# Patient Record
Sex: Female | Born: 1935 | Race: White | Hispanic: No | Smoking: Former smoker
Health system: Southern US, Community
[De-identification: ages and names within clinical notes are randomized; demographics above are authoritative.]

## PROBLEM LIST (undated history)

## (undated) DIAGNOSIS — E785 Hyperlipidemia, unspecified: Secondary | ICD-10-CM

## (undated) DIAGNOSIS — M109 Gout, unspecified: Secondary | ICD-10-CM

## (undated) DIAGNOSIS — I442 Atrioventricular block, complete: Secondary | ICD-10-CM

## (undated) DIAGNOSIS — I10 Essential (primary) hypertension: Secondary | ICD-10-CM

## (undated) DIAGNOSIS — K469 Unspecified abdominal hernia without obstruction or gangrene: Secondary | ICD-10-CM

## (undated) DIAGNOSIS — K625 Hemorrhage of anus and rectum: Secondary | ICD-10-CM

## (undated) DIAGNOSIS — I714 Abdominal aortic aneurysm, without rupture, unspecified: Secondary | ICD-10-CM

## (undated) DIAGNOSIS — Z950 Presence of cardiac pacemaker: Secondary | ICD-10-CM

## (undated) DIAGNOSIS — J449 Chronic obstructive pulmonary disease, unspecified: Secondary | ICD-10-CM

## (undated) DIAGNOSIS — N200 Calculus of kidney: Secondary | ICD-10-CM

## (undated) HISTORY — DX: Essential (primary) hypertension: I10

## (undated) HISTORY — PX: TONSILLECTOMY: SUR1361

## (undated) HISTORY — DX: Abdominal aortic aneurysm, without rupture: I71.4

## (undated) HISTORY — PX: ABDOMINAL AORTIC ANEURYSM REPAIR: SUR1152

## (undated) HISTORY — DX: Hyperlipidemia, unspecified: E78.5

## (undated) HISTORY — PX: OTHER SURGICAL HISTORY: SHX169

## (undated) HISTORY — DX: Abdominal aortic aneurysm, without rupture, unspecified: I71.40

## (undated) HISTORY — DX: Unspecified abdominal hernia without obstruction or gangrene: K46.9

## (undated) HISTORY — DX: Calculus of kidney: N20.0

## (undated) HISTORY — DX: Atrioventricular block, complete: I44.2

## (undated) HISTORY — DX: Gout, unspecified: M10.9

## (undated) HISTORY — PX: HERNIA REPAIR: SHX51

## (undated) HISTORY — DX: Presence of cardiac pacemaker: Z95.0

## (undated) HISTORY — DX: Hemorrhage of anus and rectum: K62.5

---

## 2007-06-20 ENCOUNTER — Ambulatory Visit: Payer: Self-pay | Admitting: Cardiology

## 2007-06-26 ENCOUNTER — Ambulatory Visit: Payer: Self-pay

## 2007-06-30 ENCOUNTER — Ambulatory Visit: Payer: Self-pay

## 2007-07-01 ENCOUNTER — Encounter: Payer: Self-pay | Admitting: Internal Medicine

## 2007-07-01 LAB — CONVERTED CEMR LAB
BUN: 23 mg/dL (ref 6–23)
CO2: 25 meq/L (ref 19–32)
Calcium: 9.1 mg/dL (ref 8.4–10.5)
Chloride: 105 meq/L (ref 96–112)
Creatinine, Ser: 1.18 mg/dL (ref 0.40–1.20)
Glucose, Bld: 83 mg/dL (ref 70–99)
HCT: 40.2 % (ref 36.0–46.0)
Hemoglobin: 13 g/dL (ref 12.0–15.0)
INR: 1 (ref 0.0–1.5)
MCHC: 32.3 g/dL (ref 30.0–36.0)
MCV: 91.6 fL (ref 78.0–100.0)
Potassium: 3.8 meq/L (ref 3.5–5.3)
Prothrombin Time: 13.1 s (ref 11.6–15.2)
RBC: 4.39 M/uL (ref 3.87–5.11)
RDW: 13.9 % (ref 11.5–15.5)
Sodium: 141 meq/L (ref 135–145)
WBC: 6.1 10*3/uL (ref 4.0–10.5)
aPTT: 33 s (ref 24–37)

## 2007-07-02 ENCOUNTER — Ambulatory Visit: Payer: Self-pay | Admitting: Internal Medicine

## 2007-07-03 ENCOUNTER — Inpatient Hospital Stay (HOSPITAL_COMMUNITY): Admission: RE | Admit: 2007-07-03 | Discharge: 2007-07-04 | Payer: Self-pay | Admitting: Internal Medicine

## 2007-07-17 ENCOUNTER — Ambulatory Visit: Payer: Self-pay

## 2007-07-17 ENCOUNTER — Encounter: Payer: Self-pay | Admitting: Cardiology

## 2007-07-17 ENCOUNTER — Ambulatory Visit: Payer: Self-pay | Admitting: Cardiology

## 2007-07-23 ENCOUNTER — Ambulatory Visit: Payer: Self-pay | Admitting: Cardiology

## 2007-07-28 ENCOUNTER — Ambulatory Visit: Payer: Self-pay | Admitting: Vascular Surgery

## 2007-08-12 ENCOUNTER — Encounter: Admission: RE | Admit: 2007-08-12 | Discharge: 2007-08-12 | Payer: Self-pay | Admitting: Vascular Surgery

## 2007-08-12 ENCOUNTER — Ambulatory Visit: Payer: Self-pay | Admitting: Vascular Surgery

## 2007-08-14 ENCOUNTER — Encounter: Payer: Self-pay | Admitting: Vascular Surgery

## 2007-08-14 ENCOUNTER — Inpatient Hospital Stay (HOSPITAL_COMMUNITY): Admission: RE | Admit: 2007-08-14 | Discharge: 2007-08-20 | Payer: Self-pay | Admitting: Vascular Surgery

## 2007-08-14 ENCOUNTER — Ambulatory Visit: Payer: Self-pay | Admitting: Vascular Surgery

## 2007-08-28 ENCOUNTER — Ambulatory Visit: Payer: Self-pay | Admitting: Vascular Surgery

## 2007-09-09 ENCOUNTER — Ambulatory Visit: Payer: Self-pay | Admitting: Vascular Surgery

## 2007-09-23 ENCOUNTER — Ambulatory Visit: Payer: Self-pay | Admitting: Internal Medicine

## 2007-10-07 ENCOUNTER — Encounter: Payer: Self-pay | Admitting: Internal Medicine

## 2007-10-07 ENCOUNTER — Ambulatory Visit: Payer: Self-pay | Admitting: Internal Medicine

## 2007-10-07 LAB — HM COLONOSCOPY

## 2007-10-08 ENCOUNTER — Encounter: Payer: Self-pay | Admitting: Internal Medicine

## 2007-10-22 ENCOUNTER — Ambulatory Visit: Payer: Self-pay | Admitting: Cardiovascular Disease

## 2007-10-22 ENCOUNTER — Inpatient Hospital Stay (HOSPITAL_COMMUNITY): Admission: RE | Admit: 2007-10-22 | Discharge: 2007-10-27 | Payer: Self-pay | Admitting: Surgery

## 2007-10-22 ENCOUNTER — Encounter (INDEPENDENT_AMBULATORY_CARE_PROVIDER_SITE_OTHER): Payer: Self-pay | Admitting: Surgery

## 2007-10-24 ENCOUNTER — Encounter: Payer: Self-pay | Admitting: Cardiovascular Disease

## 2007-11-19 LAB — CBC WITH DIFFERENTIAL/PLATELET
BASO%: 0.3 % (ref 0.0–2.0)
Basophils Absolute: 0 10*3/uL (ref 0.0–0.1)
EOS%: 1.8 % (ref 0.0–7.0)
Eosinophils Absolute: 0.1 10*3/uL (ref 0.0–0.5)
HCT: 32.3 % — ABNORMAL LOW (ref 34.8–46.6)
HGB: 11 g/dL — ABNORMAL LOW (ref 11.6–15.9)
LYMPH%: 19.1 % (ref 14.0–48.0)
MCH: 29.7 pg (ref 26.0–34.0)
MCHC: 33.9 g/dL (ref 32.0–36.0)
MCV: 87.7 fL (ref 81.0–101.0)
MONO#: 0.6 10*3/uL (ref 0.1–0.9)
MONO%: 9.3 % (ref 0.0–13.0)
NEUT#: 4.2 10*3/uL (ref 1.5–6.5)
NEUT%: 69.5 % (ref 39.6–76.8)
Platelets: 195 10*3/uL (ref 145–400)
RBC: 3.69 10*6/uL — ABNORMAL LOW (ref 3.70–5.32)
RDW: 14.6 % — ABNORMAL HIGH (ref 11.3–14.5)
WBC: 6.1 10*3/uL (ref 3.9–10.0)
lymph#: 1.2 10*3/uL (ref 0.9–3.3)

## 2007-11-19 LAB — COMPREHENSIVE METABOLIC PANEL
ALT: 10 U/L (ref 0–35)
AST: 15 U/L (ref 0–37)
Albumin: 4 g/dL (ref 3.5–5.2)
Alkaline Phosphatase: 72 U/L (ref 39–117)
BUN: 22 mg/dL (ref 6–23)
CO2: 29 mEq/L (ref 19–32)
Calcium: 8.9 mg/dL (ref 8.4–10.5)
Chloride: 103 mEq/L (ref 96–112)
Creatinine, Ser: 1.15 mg/dL (ref 0.40–1.20)
Glucose, Bld: 107 mg/dL — ABNORMAL HIGH (ref 70–99)
Potassium: 3.7 mEq/L (ref 3.5–5.3)
Sodium: 143 mEq/L (ref 135–145)
Total Bilirubin: 0.4 mg/dL (ref 0.3–1.2)
Total Protein: 6.1 g/dL (ref 6.0–8.3)

## 2007-11-19 LAB — CEA: CEA: 2.1 ng/mL (ref 0.0–5.0)

## 2007-11-24 ENCOUNTER — Ambulatory Visit: Payer: Self-pay | Admitting: Internal Medicine

## 2007-11-24 ENCOUNTER — Ambulatory Visit: Payer: Self-pay | Admitting: Oncology

## 2007-11-26 ENCOUNTER — Ambulatory Visit: Payer: Self-pay | Admitting: Cardiology

## 2007-12-02 ENCOUNTER — Ambulatory Visit: Payer: Self-pay | Admitting: Cardiology

## 2007-12-16 ENCOUNTER — Ambulatory Visit: Payer: Self-pay | Admitting: Vascular Surgery

## 2008-03-09 ENCOUNTER — Ambulatory Visit: Payer: Self-pay | Admitting: Vascular Surgery

## 2008-03-14 ENCOUNTER — Observation Stay: Payer: Self-pay | Admitting: Internal Medicine

## 2008-03-14 ENCOUNTER — Ambulatory Visit: Payer: Self-pay | Admitting: Cardiology

## 2008-03-16 ENCOUNTER — Ambulatory Visit: Payer: Self-pay | Admitting: Cardiology

## 2008-04-01 ENCOUNTER — Ambulatory Visit: Payer: Self-pay | Admitting: Oncology

## 2008-04-05 ENCOUNTER — Ambulatory Visit (HOSPITAL_COMMUNITY): Admission: RE | Admit: 2008-04-05 | Discharge: 2008-04-05 | Payer: Self-pay | Admitting: Oncology

## 2008-04-05 LAB — CBC WITH DIFFERENTIAL/PLATELET
BASO%: 0.9 % (ref 0.0–2.0)
Basophils Absolute: 0 10*3/uL (ref 0.0–0.1)
EOS%: 1.8 % (ref 0.0–7.0)
Eosinophils Absolute: 0.1 10*3/uL (ref 0.0–0.5)
HCT: 37.6 % (ref 34.8–46.6)
HGB: 12.8 g/dL (ref 11.6–15.9)
LYMPH%: 28.2 % (ref 14.0–49.7)
MCH: 30.7 pg (ref 25.1–34.0)
MCHC: 34.2 g/dL (ref 31.5–36.0)
MCV: 89.9 fL (ref 79.5–101.0)
MONO#: 0.4 10*3/uL (ref 0.1–0.9)
MONO%: 7.2 % (ref 0.0–14.0)
NEUT#: 3.5 10*3/uL (ref 1.5–6.5)
NEUT%: 61.9 % (ref 38.4–76.8)
Platelets: 162 10*3/uL (ref 145–400)
RBC: 4.18 10*6/uL (ref 3.70–5.45)
RDW: 14.3 % (ref 11.2–14.5)
WBC: 5.7 10*3/uL (ref 3.9–10.3)
lymph#: 1.6 10*3/uL (ref 0.9–3.3)

## 2008-04-05 LAB — COMPREHENSIVE METABOLIC PANEL
ALT: 20 U/L (ref 0–35)
AST: 25 U/L (ref 0–37)
Albumin: 4.4 g/dL (ref 3.5–5.2)
Alkaline Phosphatase: 78 U/L (ref 39–117)
BUN: 22 mg/dL (ref 6–23)
CO2: 29 mEq/L (ref 19–32)
Calcium: 9.8 mg/dL (ref 8.4–10.5)
Chloride: 102 mEq/L (ref 96–112)
Creatinine, Ser: 1.25 mg/dL — ABNORMAL HIGH (ref 0.40–1.20)
Glucose, Bld: 100 mg/dL — ABNORMAL HIGH (ref 70–99)
Potassium: 4 mEq/L (ref 3.5–5.3)
Sodium: 139 mEq/L (ref 135–145)
Total Bilirubin: 1 mg/dL (ref 0.3–1.2)
Total Protein: 7.6 g/dL (ref 6.0–8.3)

## 2008-04-05 LAB — CEA: CEA: 2 ng/mL (ref 0.0–5.0)

## 2008-05-07 ENCOUNTER — Encounter (INDEPENDENT_AMBULATORY_CARE_PROVIDER_SITE_OTHER): Payer: Self-pay | Admitting: *Deleted

## 2008-06-17 DIAGNOSIS — Z950 Presence of cardiac pacemaker: Secondary | ICD-10-CM | POA: Insufficient documentation

## 2008-06-17 DIAGNOSIS — I714 Abdominal aortic aneurysm, without rupture: Secondary | ICD-10-CM | POA: Insufficient documentation

## 2008-06-17 DIAGNOSIS — I442 Atrioventricular block, complete: Secondary | ICD-10-CM | POA: Insufficient documentation

## 2008-06-28 ENCOUNTER — Encounter: Payer: Self-pay | Admitting: Internal Medicine

## 2008-06-28 ENCOUNTER — Ambulatory Visit: Payer: Self-pay | Admitting: Internal Medicine

## 2008-08-10 ENCOUNTER — Encounter: Payer: Self-pay | Admitting: Cardiology

## 2008-08-10 ENCOUNTER — Ambulatory Visit: Payer: Self-pay

## 2008-08-24 ENCOUNTER — Ambulatory Visit: Payer: Self-pay | Admitting: Cardiology

## 2008-08-24 DIAGNOSIS — I6529 Occlusion and stenosis of unspecified carotid artery: Secondary | ICD-10-CM | POA: Insufficient documentation

## 2008-09-21 ENCOUNTER — Ambulatory Visit: Payer: Self-pay | Admitting: Vascular Surgery

## 2008-09-30 ENCOUNTER — Encounter: Payer: Self-pay | Admitting: Cardiovascular Disease

## 2008-09-30 ENCOUNTER — Encounter: Payer: Self-pay | Admitting: Cardiology

## 2008-10-01 ENCOUNTER — Ambulatory Visit: Payer: Self-pay | Admitting: Oncology

## 2008-10-05 ENCOUNTER — Ambulatory Visit (HOSPITAL_COMMUNITY): Admission: RE | Admit: 2008-10-05 | Discharge: 2008-10-05 | Payer: Self-pay | Admitting: Oncology

## 2008-10-05 LAB — COMPREHENSIVE METABOLIC PANEL
ALT: 18 U/L (ref 0–35)
AST: 22 U/L (ref 0–37)
Albumin: 4.3 g/dL (ref 3.5–5.2)
Alkaline Phosphatase: 88 U/L (ref 39–117)
BUN: 26 mg/dL — ABNORMAL HIGH (ref 6–23)
CO2: 28 mEq/L (ref 19–32)
Calcium: 9.9 mg/dL (ref 8.4–10.5)
Chloride: 102 mEq/L (ref 96–112)
Creatinine, Ser: 1.46 mg/dL — ABNORMAL HIGH (ref 0.40–1.20)
Glucose, Bld: 103 mg/dL — ABNORMAL HIGH (ref 70–99)
Potassium: 4.2 mEq/L (ref 3.5–5.3)
Sodium: 138 mEq/L (ref 135–145)
Total Bilirubin: 0.8 mg/dL (ref 0.3–1.2)
Total Protein: 7.1 g/dL (ref 6.0–8.3)

## 2008-10-05 LAB — CBC WITH DIFFERENTIAL/PLATELET
BASO%: 1 % (ref 0.0–2.0)
Basophils Absolute: 0.1 10*3/uL (ref 0.0–0.1)
EOS%: 1.4 % (ref 0.0–7.0)
Eosinophils Absolute: 0.1 10*3/uL (ref 0.0–0.5)
HCT: 37.3 % (ref 34.8–46.6)
HGB: 13 g/dL (ref 11.6–15.9)
LYMPH%: 27.8 % (ref 14.0–49.7)
MCH: 31.7 pg (ref 25.1–34.0)
MCHC: 34.9 g/dL (ref 31.5–36.0)
MCV: 90.7 fL (ref 79.5–101.0)
MONO#: 0.4 10*3/uL (ref 0.1–0.9)
MONO%: 8 % (ref 0.0–14.0)
NEUT#: 3.2 10*3/uL (ref 1.5–6.5)
NEUT%: 61.8 % (ref 38.4–76.8)
Platelets: 151 10*3/uL (ref 145–400)
RBC: 4.11 10*6/uL (ref 3.70–5.45)
RDW: 13.7 % (ref 11.2–14.5)
WBC: 5.2 10*3/uL (ref 3.9–10.3)
lymph#: 1.5 10*3/uL (ref 0.9–3.3)

## 2008-10-05 LAB — CEA: CEA: 2.5 ng/mL (ref 0.0–5.0)

## 2008-10-07 ENCOUNTER — Encounter: Payer: Self-pay | Admitting: Cardiology

## 2008-10-25 ENCOUNTER — Telehealth: Payer: Self-pay | Admitting: Cardiovascular Disease

## 2008-10-25 ENCOUNTER — Encounter: Payer: Self-pay | Admitting: Cardiology

## 2008-10-28 ENCOUNTER — Encounter: Payer: Self-pay | Admitting: Cardiovascular Disease

## 2008-12-06 ENCOUNTER — Ambulatory Visit (HOSPITAL_COMMUNITY): Admission: RE | Admit: 2008-12-06 | Discharge: 2008-12-06 | Payer: Self-pay | Admitting: General Surgery

## 2008-12-09 ENCOUNTER — Telehealth: Payer: Self-pay | Admitting: Cardiology

## 2008-12-27 ENCOUNTER — Encounter: Payer: Self-pay | Admitting: Cardiology

## 2009-02-07 ENCOUNTER — Encounter: Payer: Self-pay | Admitting: Cardiology

## 2009-02-28 ENCOUNTER — Encounter: Payer: Self-pay | Admitting: Cardiology

## 2009-03-07 ENCOUNTER — Ambulatory Visit: Payer: Self-pay | Admitting: Internal Medicine

## 2009-03-07 DIAGNOSIS — I4891 Unspecified atrial fibrillation: Secondary | ICD-10-CM | POA: Insufficient documentation

## 2009-03-23 ENCOUNTER — Inpatient Hospital Stay (HOSPITAL_COMMUNITY): Admission: RE | Admit: 2009-03-23 | Discharge: 2009-03-28 | Payer: Self-pay | Admitting: General Surgery

## 2009-05-02 ENCOUNTER — Ambulatory Visit: Payer: Self-pay | Admitting: Internal Medicine

## 2009-05-05 ENCOUNTER — Telehealth: Payer: Self-pay | Admitting: Internal Medicine

## 2009-05-16 ENCOUNTER — Telehealth: Payer: Self-pay | Admitting: Internal Medicine

## 2009-05-20 ENCOUNTER — Ambulatory Visit: Payer: Self-pay | Admitting: Cardiology

## 2009-05-20 LAB — CONVERTED CEMR LAB: POC INR: 1

## 2009-05-25 ENCOUNTER — Ambulatory Visit: Payer: Self-pay | Admitting: Internal Medicine

## 2009-05-25 LAB — CONVERTED CEMR LAB: POC INR: 1.4

## 2009-05-30 ENCOUNTER — Ambulatory Visit: Payer: Self-pay | Admitting: Cardiovascular Disease

## 2009-05-30 LAB — CONVERTED CEMR LAB: POC INR: 2.6

## 2009-06-06 ENCOUNTER — Ambulatory Visit: Payer: Self-pay | Admitting: Cardiovascular Disease

## 2009-06-06 LAB — CONVERTED CEMR LAB: POC INR: 3

## 2009-06-15 ENCOUNTER — Ambulatory Visit: Payer: Self-pay | Admitting: Cardiovascular Disease

## 2009-06-15 LAB — CONVERTED CEMR LAB: POC INR: 3

## 2009-06-29 ENCOUNTER — Ambulatory Visit: Payer: Self-pay | Admitting: Cardiovascular Disease

## 2009-06-29 LAB — CONVERTED CEMR LAB: POC INR: 2.7

## 2009-07-18 ENCOUNTER — Ambulatory Visit: Payer: Self-pay | Admitting: Cardiovascular Disease

## 2009-07-18 LAB — CONVERTED CEMR LAB: POC INR: 2

## 2009-08-17 ENCOUNTER — Encounter: Payer: Self-pay | Admitting: Cardiology

## 2009-08-22 ENCOUNTER — Ambulatory Visit: Payer: Self-pay | Admitting: Cardiovascular Disease

## 2009-08-22 LAB — CONVERTED CEMR LAB: POC INR: 2.3

## 2009-08-24 ENCOUNTER — Encounter: Payer: Self-pay | Admitting: Cardiology

## 2009-08-25 ENCOUNTER — Ambulatory Visit: Payer: Self-pay | Admitting: Cardiovascular Disease

## 2009-08-25 ENCOUNTER — Ambulatory Visit: Payer: Self-pay

## 2009-08-25 ENCOUNTER — Encounter: Payer: Self-pay | Admitting: Cardiology

## 2009-08-25 DIAGNOSIS — E785 Hyperlipidemia, unspecified: Secondary | ICD-10-CM | POA: Insufficient documentation

## 2009-08-25 DIAGNOSIS — I10 Essential (primary) hypertension: Secondary | ICD-10-CM | POA: Insufficient documentation

## 2009-09-05 ENCOUNTER — Ambulatory Visit: Payer: Self-pay | Admitting: Internal Medicine

## 2009-09-07 ENCOUNTER — Ambulatory Visit: Payer: Self-pay | Admitting: Family Medicine

## 2009-09-07 DIAGNOSIS — F411 Generalized anxiety disorder: Secondary | ICD-10-CM | POA: Insufficient documentation

## 2009-09-13 ENCOUNTER — Ambulatory Visit: Payer: Self-pay | Admitting: Family Medicine

## 2009-09-14 ENCOUNTER — Encounter: Payer: Self-pay | Admitting: Family Medicine

## 2009-09-14 LAB — CONVERTED CEMR LAB
ALT: 11 units/L (ref 0–35)
AST: 17 units/L (ref 0–37)
Albumin: 4.1 g/dL (ref 3.5–5.2)
Alkaline Phosphatase: 77 units/L (ref 39–117)
BUN: 18 mg/dL (ref 6–23)
Bilirubin, Direct: 0.1 mg/dL (ref 0.0–0.3)
CO2: 28 meq/L (ref 19–32)
Calcium: 9.2 mg/dL (ref 8.4–10.5)
Chloride: 105 meq/L (ref 96–112)
Cholesterol: 172 mg/dL (ref 0–200)
Creatinine, Ser: 1.2 mg/dL (ref 0.4–1.2)
GFR calc non Af Amer: 45.26 mL/min (ref >60)
Glucose, Bld: 96 mg/dL (ref 70–99)
HDL: 53.5 mg/dL (ref >39.00)
LDL Cholesterol: 91 mg/dL (ref 0–99)
Potassium: 4.7 meq/L (ref 3.5–5.1)
Sodium: 143 meq/L (ref 135–145)
Total Bilirubin: 0.7 mg/dL (ref 0.3–1.2)
Total CHOL/HDL Ratio: 3
Total Protein: 6.5 g/dL (ref 6.0–8.3)
Triglycerides: 137 mg/dL (ref 0.0–149.0)
VLDL: 27.4 mg/dL (ref 0.0–40.0)

## 2009-09-21 ENCOUNTER — Ambulatory Visit: Payer: Self-pay | Admitting: Cardiology

## 2009-09-21 LAB — CONVERTED CEMR LAB: POC INR: 2.1

## 2009-10-04 ENCOUNTER — Ambulatory Visit: Payer: Self-pay | Admitting: Vascular Surgery

## 2009-10-19 ENCOUNTER — Ambulatory Visit: Payer: Self-pay | Admitting: Cardiology

## 2009-10-19 LAB — CONVERTED CEMR LAB: POC INR: 2.2

## 2009-11-16 ENCOUNTER — Ambulatory Visit: Payer: Self-pay | Admitting: Cardiovascular Disease

## 2009-11-16 LAB — CONVERTED CEMR LAB: POC INR: 2.2

## 2009-11-18 ENCOUNTER — Encounter (INDEPENDENT_AMBULATORY_CARE_PROVIDER_SITE_OTHER): Payer: Self-pay | Admitting: *Deleted

## 2009-11-29 ENCOUNTER — Encounter: Payer: Self-pay | Admitting: Family Medicine

## 2009-12-14 ENCOUNTER — Ambulatory Visit: Payer: Self-pay | Admitting: Cardiovascular Disease

## 2009-12-14 LAB — CONVERTED CEMR LAB: POC INR: 2.3

## 2010-01-11 ENCOUNTER — Ambulatory Visit: Payer: Self-pay | Admitting: Cardiovascular Disease

## 2010-01-11 LAB — CONVERTED CEMR LAB: POC INR: 2.4

## 2010-02-08 ENCOUNTER — Ambulatory Visit: Admission: RE | Admit: 2010-02-08 | Discharge: 2010-02-08 | Payer: Self-pay | Source: Home / Self Care

## 2010-02-08 LAB — CONVERTED CEMR LAB: POC INR: 1.6

## 2010-02-21 NOTE — Medication Information (Signed)
Summary: CCR/AMD  Anticoagulant Therapy  Managed by: Freddrick March, RN, BSN PCP: [name] Supervising MD: Rockey Situ Indication 1: Atrial Fibrillation Lab Used: [facility] [place] Site: [place] INR POC 2.0 INR RANGE 2.0-3.0  Dietary changes: no    Health status changes: no    Bleeding/hemorrhagic complications: no    Recent/future hospitalizations: no    Any changes in medication regimen? yes       Details: Pt wants to discontinue Paxil currently taking 10mg  every 3rd day.  Recent/future dental: no  Any missed doses?: yes     Details: Missed 1 dose approx 10 days ago.    Is patient compliant with meds? yes       Allergies: 1)  ! Codeine 2)  ! Morphine  Anticoagulation Management History:      The patient is taking warfarin and comes in today for a routine follow up visit.  Positive risk factors for bleeding include an age of 75 years or older.  The bleeding index is 'intermediate risk'.  Positive CHADS2 values include Age > 61 years old.  Her last INR was 1.0.  Anticoagulation responsible provider: Gollan.  INR POC: 2.0.  Cuvette Lot#: [id number].  Exp: 09/2010.    Anticoagulation Management Assessment/Plan:      The patient's current anticoagulation dose is Warfarin sodium 5 mg tabs: 1 tab by mouth every evening or as directed by Anticoagulation Clinic.  The target INR is 2.0-3.0.  The next INR is due 08/22/2009.  Results were reviewed/authorized by Freddrick March, RN, BSN.  She was notified by Freddrick March RN.         Prior Anticoagulation Instructions: INR 2.7  Continue on same dosage 1 tablet daily except 1.5 tablets on Saturdays.  Recheck in 3 weeks.    Current Anticoagulation Instructions: INR 2.0  Continue on same dosage 1 tablet daily except 1.5 tablets on Saturdays.  Recheck in 4 weeks.

## 2010-02-21 NOTE — Letter (Signed)
Summary: Generic Letter  [place] at [facility]  [address] [place],  [address]   Phone: [phone]  Fax: [phone]    09/14/2009  [facility] [address] [place],   [address]  Dear Ms. COCKBURN,   All of your lab results including cholesterol, kidney, liver function and electrolytes are within normal limits.  Keep up the good work.!        Sincerely,       Arnette Norris, MD

## 2010-02-21 NOTE — Assessment & Plan Note (Signed)
Summary: F2M/AMD   Referring Provider:  Mar Daring, MD Primary Provider:  [name]  CC:  ROV; device check.  History of Present Illness: . [name] is seen in followup for a pacemaker implanted for complete heart block now status post device generator replacement about a year ago. exercise tolerance is much improved since that time.  She recently underwent hernia repair consequential to her AAA repair.  Temporally associated with post op atrial fibrillation  Thrombo embolic risk factors include htn, gender, agex2, vascular disease, prior TIA  she complains of chest pain primarily in her neck radiating to her arms bilaterally occurring at night and awakening her from slee.It is relieved by repositioning herself in bed       .       Problems Prior to Update: 1)  Atrial Fibrillation  (ICD-427.31) 2)  Carotid Artery Stenosis, Without Infarction  (ICD-433.10) 3)  Av Block, Complete  (ICD-426.0) 4)  Pacemaker  (ICD-V45.Marland Kitchen01) 5)  Abdominal Aortic Aneurysm  (ICD-441.4)  Medications Prior to Update: 1)  Aspirin 81 Mg Tbec (Aspirin) .... Take One Tablet By Mouth Daily - On Hold 2)  Lisinopril-Hydrochlorothiazide 10-12.5 Mg Tabs (Lisinopril-Hydrochlorothiazide) .... Take 1 By Mouth Once Daily - On Hold 3)  Simvastatin 20 Mg Tabs (Simvastatin) .... Take One Tablet By Mouth Daily At Bedtime 4)  Paroxetine Hcl 10 Mg Tabs (Paroxetine Hcl) .... Take 1 By Mouth Once Daily 5)  Ventolin Hfa 108 (90 Base) Mcg/act Aers (Albuterol Sulfate) .... As Needed 6)  Spiriva Handihaler 18 Mcg Caps (Tiotropium Bromide Monohydrate) .... As Needed 7)  Flax Seed Oil 1000 Mg Caps (Flaxseed (Linseed)) .... Three Times A Day 8)  Multivitamins   Tabs (Multiple Vitamin) .... Once Daily -  On Hold  Current Medications (verified): 1)  Lisinopril-Hydrochlorothiazide 10-12.5 Mg Tabs (Lisinopril-Hydrochlorothiazide) .... Take 1 By Mouth Once Daily - On Hold 2)  Simvastatin 20 Mg Tabs (Simvastatin) .... Take One  Tablet By Mouth Daily At Bedtime 3)  Paroxetine Hcl 10 Mg Tabs (Paroxetine Hcl) .... Take 1 By Mouth Once Daily 4)  Ventolin Hfa 108 (90 Base) Mcg/act Aers (Albuterol Sulfate) .... As Needed 5)  Spiriva Handihaler 18 Mcg Caps (Tiotropium Bromide Monohydrate) .... As Needed  Allergies: 1)  ! Codeine 2)  ! Morphine  Vital Signs:  Patient profile:   77 year old female Height:      65 inches Weight:      143 pounds Pulse rate:   89 / minute BP sitting:   118 / 78  (right arm)  Vitals Entered By: Eliezer Lofts, EMT-P (May 02, 2009 9:42 AM)  Physical Exam  General:  The patient was alert and oriented in no acute distress. HEENT Normal.  Neck veins were flat, carotids were brisk.  Lungs were clear.  Heart sounds were regular without murmurs or gallops.  Abdomen was soft with active bowel sounds. There is no clubbing cyanosis or edema. Skin Warm and dry    EKG  Procedure date:  05/02/2009  Findings:      sinus rhythm with P. synchronous pacing  PPM Specifications Following MD:  Virl Axe, MD     PPM Vendor:  [facility] Jude     PPM Model Number:  [id number]     PPM Serial Number:  [id number] PPM DOI:  07/02/2007      Lead 1    Location: [facility]     DOI: 07/02/2007     Model #: [id number]     Serial #: [id number]  Status: active Lead 2    Location: RV     DOI: 07/02/2007     Model #: [id number]     Serial #: [id number]     Status: active   Indications:  CHB    PPM Follow Up Remote Check?  No Battery Voltage:  2.79 V     Battery Est. Longevity:  7.25 years     Pacer Dependent:  No       PPM Device Measurements Atrium  Amplitude: 3.0 mV, Impedance: 468 ohms, Threshold: 0.5 V at 0.5 msec Right Ventricle  Amplitude: 5.4 mV, Impedance: 624 ohms, Threshold: 0.625 V at 0.5 msec  Episodes MS Episodes:  1     Percent Mode Switch:  4.6%     Coumadin:  No Atrial Pacing:  8.6%     Ventricular Pacing:  100%  Parameters Mode:  DDDR     Lower Rate Limit:  60     Upper Rate Limit:  120 Paced AV Delay:  200      Sensed AV Delay:  150 Next Cardiology Appt Due:  10/22/2009 Tech Comments:  One mode switch lasting >2 days, -coumadin.  No parameter changes.  Device function normal.  ROV 6 months [place] clinic. Alma Friendly, LPN  April 11, 624THL 624THL AM   Impression & Recommendations:  Problem # 1:  ATRIAL FIBRILLATION (ICD-427.31) Patient has multiple risk factors for thromboembolism in the context of her atrial fibrillation as outlined above. We will begin her on oral anticoagulation therapy.We had a lengthy discussion regarding the relative merits of Coumadin versus Pradaxa. These included a relative benefits as well as risks. The patient would like to begin on Pradaxa. This discussion took greater than 10 minutes We will discontinue her aspirin  Problem # 2:  AV BLOCK, COMPLETE (ICD-426.0) stable following device implantation The following medications were removed from the medication list:    Aspirin 81 Mg Tbec (Aspirin) .Marland Kitchen... Take one tablet by mouth daily - on hold Her updated medication list for this problem includes:    Lisinopril-hydrochlorothiazide 10-12.5 Mg Tabs (Lisinopril-hydrochlorothiazide) .Marland Kitchen... Take 1 by mouth once daily - on hold  Problem # 3:  PACEMAKER (ICD-V45.Marland Kitchen01) Device parameters and data were reviewed; device was reprogrammed for atrial high rate detection  Problem # 4:  CHEST PAIN, ATYPICAL (ICD-786.59) she is awakening with atypical chest pain is relieved by movement.  is almost certainly musculoskeletal    Aspirin 81 Mg Tbec (Aspirin) .Marland Kitchen... Take one tablet by mouth daily - on hold Her updated medication list for this problem includes:    Lisinopril-hydrochlorothiazide 10-12.5 Mg Tabs (Lisinopril-hydrochlorothiazide) .Marland Kitchen... Take 1 by mouth once daily - on hold  Patient Instructions: 1)  Your physician recommends that you schedule a follow-up appointment in: 4 months 2)  Your physician has recommended you make the following change in your medication: stop aspirin,  start pradaxa 150 mg twice daily Prescriptions: PRADAXA 150 MG CAPS (DABIGATRAN ETEXILATE MESYLATE) 1 tab by mouth twice a day  #60 x 6   Entered by:   Gabriel Cirri, RN, BSN   Authorized by:   Nikki Dom, MD, [facility]   Signed by:   Gabriel Cirri, RN, BSN on 05/02/2009   Method used:   Electronically to        [facility] (retail)       [address]       [place], [place]  [address]       Ph: [phone]  Fax: [phone]   [id number]

## 2010-02-21 NOTE — Medication Information (Signed)
Summary: rov/ewj  Anticoagulant Therapy  Managed by: Freddrick March, RN, BSN PCP: [name] Supervising MD: Rockey Situ Indication 1: Atrial Fibrillation Lab Used: [facility] [place] Site: [place] INR POC 2.2 INR RANGE 2.0-3.0  Dietary changes: no    Health status changes: no    Bleeding/hemorrhagic complications: no    Recent/future hospitalizations: no    Any changes in medication regimen? no    Recent/future dental: no  Any missed doses?: no       Is patient compliant with meds? yes       Allergies: 1)  ! Codeine 2)  ! Morphine  Anticoagulation Management History:      The patient is taking warfarin and comes in today for a routine follow up visit.  Positive risk factors for bleeding include an age of 75 years or older.  The bleeding index is 'intermediate risk'.  Positive CHADS2 values include History of HTN and Age > 42 years old.  Her last INR was 1.0.  Anticoagulation responsible provider: gollan.  INR POC: 2.2.  Cuvette Lot#: [id number].  Exp: 11/2010.    Anticoagulation Management Assessment/Plan:      The patient's current anticoagulation dose is Warfarin sodium 5 mg tabs: 1 tab by mouth every evening or as directed by Anticoagulation Clinic.  The target INR is 2.0-3.0.  The next INR is due 12/14/2009.  Results were reviewed/authorized by Freddrick March, RN, BSN.  She was notified by Freddrick March RN.         Prior Anticoagulation Instructions: INR 2.2  Continue on same dosage 1 tablet daily except 1.5 tablets on Saturdays.  Recheck in 4 weeks.   Current Anticoagulation Instructions: INR 2.2  Continue on same dosage 1 tablet daily except 1.5 tablets on Saturdays.  Recheck in 4 weeks.

## 2010-02-21 NOTE — Medication Information (Signed)
Summary: CCR  Anticoagulant Therapy  Managed by: Freddrick March, RN, BSN PCP: [name] Supervising MD: Rockey Situ Indication 1: Atrial Fibrillation Lab Used: [facility] [place] Site: [place] INR POC 2.6 INR RANGE 2.0-3.0  Dietary changes: no     Bleeding/hemorrhagic complications: no     Any changes in medication regimen? no     Any missed doses?: no       Is patient compliant with meds? yes       Allergies: 1)  ! Codeine 2)  ! Morphine  Anticoagulation Management History:      The patient is taking warfarin and comes in today for a routine follow up visit.  Positive risk factors for bleeding include an age of 75 years or older.  The bleeding index is 'intermediate risk'.  Positive CHADS2 values include Age > 75 years old.  Her last INR was 1.0.  Anticoagulation responsible provider: Stone Spirito.  INR POC: 2.6.  Cuvette Lot#: [id number].  Exp: 05/2010.    Anticoagulation Management Assessment/Plan:      The patient's current anticoagulation dose is Warfarin sodium 5 mg tabs: 1 tab by mouth every evening or as directed by Anticoagulation Clinic.  The next INR is due 06/06/2009.  Results were reviewed/authorized by Freddrick March, RN, BSN.  She was notified by Freddrick March RN.         Prior Anticoagulation Instructions: INR 1.4  Start taking 5mg  daily except 7.5mg  on Wednesdays and Fridays.  Recheck on Monday.    Current Anticoagulation Instructions: INR 2.6  Continue on same dosage 5mg  daily except 7.5mg  on Wednesdays and Saturdays.  Recheck in 1 week.

## 2010-02-21 NOTE — Progress Notes (Signed)
Summary: pradaxa and insurance issues  Phone Note Outgoing Call   Summary of Call: insurance will not cover pradaxa.  left message.  Initial call taken by: Gabriel Cirri, RN, BSN,  May 05, 2009 11:05 AM  Follow-up for Phone Call        pt aware. does not want to start coumadin.  will think about her options.  Melissa Howdeshell RN BSN   pt attempted to take pradaxa.  became lethargic and could not continue on the medication.  pt is adamant against starting coumadin.  please advise. Follow-up by: Gabriel Cirri, RN, BSN,  May 10, 2009 4:39 PM     Appended Document: pradaxa and insurance issues no pradaxa oh well no coumadin too bad the  best she can do is asa tanks  Appended Document: pradaxa and insurance issues pt aware. Gabriel Cirri RN BSN

## 2010-02-21 NOTE — Medication Information (Signed)
Summary: CCR/AMD  Anticoagulant Therapy  Managed by: Freddrick March, RN, BSN PCP: [name] Supervising MD: Rockey Situ Indication 1: Atrial Fibrillation Lab Used: [facility] [place] Site: [place] INR POC 1.4 INR RANGE 2.0-3.0    Bleeding/hemorrhagic complications: no     Any changes in medication regimen? no     Any missed doses?: no       Is patient compliant with meds? yes      Comments: Pt started on 5mg  daily on Friday 4/29.  Allergies: 1)  ! Codeine 2)  ! Morphine  Anticoagulation Management History:      The patient is taking warfarin and comes in today for a routine follow up visit.  Positive risk factors for bleeding include an age of 42 years or older.  The bleeding index is 'intermediate risk'.  Positive CHADS2 values include Age > 36 years old.  Her last INR was 1.0.  Anticoagulation responsible provider: Alvenia Treese.  INR POC: 1.4.    Anticoagulation Management Assessment/Plan:      The patient's current anticoagulation dose is Warfarin sodium 5 mg tabs: 1 tab by mouth every evening or as directed by Anticoagulation Clinic.  The next INR is due 05/30/2009.  Results were reviewed/authorized by Freddrick March, RN, BSN.  She was notified by Freddrick March RN.         Prior Anticoagulation Instructions: coumadin 5 mg daily   Current Anticoagulation Instructions: INR 1.4  Start taking 5mg  daily except 7.5mg  on Wednesdays and Fridays.  Recheck on Monday.

## 2010-02-21 NOTE — Progress Notes (Signed)
Summary: starting on coumadin  Phone Note Call from Patient   Summary of Call: pt has decided to start on coumadin after speaking with son.  rx called in to [facility].  pt will come in Friday for CCR check. Initial call taken by: Gabriel Cirri, RN, BSN,  May 16, 2009 1:41 PM    New/Updated Medications: WARFARIN SODIUM 5 MG TABS (WARFARIN SODIUM) 1 tab by mouth every evening or as directed by Anticoagulation Clinic Prescriptions: WARFARIN SODIUM 5 MG TABS (WARFARIN SODIUM) 1 tab by mouth every evening or as directed by Anticoagulation Clinic  #30 x 6   Entered by:   Gabriel Cirri, RN, BSN   Authorized by:   Nikki Dom, MD, [facility]   Signed by:   Gabriel Cirri, RN, BSN on 05/16/2009   Method used:   Electronically to        [facility]* (retail)       [address]       [place], [place]  [address]       Ph: [phone]       Fax: [phone]   RxID:   [id number]

## 2010-02-21 NOTE — Miscellaneous (Signed)
Summary: dx correction  Clinical Lists Changes  Problems: Changed problem from PACEMAKER (ICD-V45.Marland Kitchen01) to PACEMAKER, PERMANENT (ICD-V45.01) changed the incorrect dx code to correct dx code Jessica [name]  November 18, 2009 12:56 PM

## 2010-02-21 NOTE — Letter (Signed)
Summary: Dr Georgia Dom Office Note  Dr Georgia Dom Office Note   Imported By: Sallee Provencal 04/01/2009 16:22:10  _____________________________________________________________________  External Attachment:    Type:   Image     Comment:   External Document

## 2010-02-21 NOTE — Medication Information (Signed)
Summary: CCR  Anticoagulant Therapy  Managed by: Freddrick March, RN, BSN PCP: [name] Supervising MD: Rockey Situ Indication 1: Atrial Fibrillation Lab Used: [facility] [place] Site: [place] INR POC 2.3 INR RANGE 2.0-3.0   Health status changes: no    Bleeding/hemorrhagic complications: no     Any changes in medication regimen? no     Any missed doses?: no       Is patient compliant with meds? yes       Allergies: 1)  ! Codeine 2)  ! Morphine  Anticoagulation Management History:      The patient is taking warfarin and comes in today for a routine follow up visit.  Positive risk factors for bleeding include an age of 75 years or older.  The bleeding index is 'intermediate risk'.  Positive CHADS2 values include Age > 72 years old.  Her last INR was 1.0.  Anticoagulation responsible provider: Gollan.  INR POC: 2.3.  Cuvette Lot#: [id number].  Exp: 06/2010.    Anticoagulation Management Assessment/Plan:      The patient's current anticoagulation dose is Warfarin sodium 5 mg tabs: 1 tab by mouth every evening or as directed by Anticoagulation Clinic.  The target INR is 2.0-3.0.  The next INR is due 09/21/2009.  Results were reviewed/authorized by Freddrick March, RN, BSN.  She was notified by Cordelia Pen, RN.         Prior Anticoagulation Instructions: INR 2.0  Continue on same dosage 1 tablet daily except 1.5 tablets on Saturdays.  Recheck in 4 weeks.    Current Anticoagulation Instructions: INR 2.3   Continue taking  1 tab daily except for 1.5 tabs on Saturday. Recheck in 4 weeks.

## 2010-02-21 NOTE — Medication Information (Signed)
Summary: Jessica [name]  Anticoagulant Therapy  Managed by: Tula Nakayama, RN, BSN PCP: [name] Supervising MD: Rockey Situ Indication 1: Atrial Fibrillation Lab Used: [facility] [place] Site: [place] INR POC 2.3 INR RANGE 2.0-3.0  Dietary changes: no    Health status changes: no    Bleeding/hemorrhagic complications: no    Recent/future hospitalizations: no    Any changes in medication regimen? yes       Details: Vitamin B12 daily new med  Recent/future dental: no  Any missed doses?: no       Is patient compliant with meds? yes       Allergies: 1)  ! Codeine 2)  ! Morphine  Anticoagulation Management History:      The patient is taking warfarin and comes in today for a routine follow up visit.  Positive risk factors for bleeding include an age of 22 years or older.  The bleeding index is 'intermediate risk'.  Positive CHADS2 values include History of HTN and Age > 71 years old.  Her last INR was 1.0.  Anticoagulation responsible provider: gollan.  INR POC: 2.3.  Cuvette Lot#: [id number].  Exp: 12/2010.    Anticoagulation Management Assessment/Plan:      The patient's current anticoagulation dose is Warfarin sodium 5 mg tabs: 1 tab by mouth every evening or as directed by Anticoagulation Clinic.  The target INR is 2.0-3.0.  The next INR is due 01/11/2010.  Anticoagulation instructions were given to patient.  Results were reviewed/authorized by Tula Nakayama, RN, BSN.  She was notified by Tula Nakayama, RN, BSN.         Prior Anticoagulation Instructions: INR 2.2  Continue on same dosage 1 tablet daily except 1.5 tablets on Saturdays.  Recheck in 4 weeks.    Current Anticoagulation Instructions: INR 2.3 Continue 5mg s everyday except 7.5mg s on Saturdays. Recheck in 4 weeks.

## 2010-02-21 NOTE — Miscellaneous (Signed)
Summary: Orders Update  Clinical Lists Changes  Orders: Added new Test order of Carotid Duplex (Carotid Duplex) - Signed 

## 2010-02-21 NOTE — Medication Information (Signed)
Summary: rov/ewj  Anticoagulant Therapy  Managed by: Freddrick March, RN, BSN PCP: [name] Supervising MD: Aundra Dubin MD, [place] Indication 1: Atrial Fibrillation Lab Used: [facility] [place] Site: [place] INR POC 2.2 INR RANGE 2.0-3.0  Dietary changes: no    Health status changes: no    Bleeding/hemorrhagic complications: no    Recent/future hospitalizations: no    Any changes in medication regimen? no    Recent/future dental: no  Any missed doses?: no       Is patient compliant with meds? yes       Allergies: 1)  ! Codeine 2)  ! Morphine  Anticoagulation Management History:      The patient is taking warfarin and comes in today for a routine follow up visit.  Positive risk factors for bleeding include an age of 27 years or older.  The bleeding index is 'intermediate risk'.  Positive CHADS2 values include History of HTN and Age > 33 years old.  Her last INR was 1.0.  Anticoagulation responsible Mashelle Busick: Aundra Dubin MD, [place].  INR POC: 2.2.  Cuvette Lot#: [id number].  Exp: 11/2010.    Anticoagulation Management Assessment/Plan:      The patient's current anticoagulation dose is Warfarin sodium 5 mg tabs: 1 tab by mouth every evening or as directed by Anticoagulation Clinic.  The target INR is 2.0-3.0.  The next INR is due 11/16/2009.  Results were reviewed/authorized by Freddrick March, RN, BSN.  She was notified by Freddrick March RN.         Prior Anticoagulation Instructions: INR 2.1  Continue on same dosage 1 tablet daily except 1.5 tablets on Saturdays.   Recheck in 4 weeks.    Current Anticoagulation Instructions: INR 2.2  Continue on same dosage 1 tablet daily except 1.5 tablets on Saturdays.  Recheck in 4 weeks.

## 2010-02-21 NOTE — Assessment & Plan Note (Signed)
Summary: [facility]   CC:  Device Check.  Allergies (verified): 1)  ! Codeine 2)  ! Morphine  Vital Signs:  Patient profile:   77 year old female Height:      65 inches Weight:      135 pounds BMI:     22.55 Pulse rate:   91 / minute BP sitting:   136 / 78  (left arm) Cuff size:   regular  Vitals Entered By: Eliezer Lofts, EMT-P (June 28, 2008 9:52 AM)   PPM Specifications Following MD:  Virl Axe, MD     PPM Vendor:  [employer]     PPM Model Number:  [id number]     PPM Serial Number:  [id number] PPM DOI:  07/02/2007       PPM Follow Up Remote Check?  No Battery Voltage:  2.79 V     Battery Est. Longevity:  >10 years       PPM Device Measurements Atrium  Amplitude: 2.6 mV, Impedance: 434 ohms, Threshold: .5 V at 0.5 msec Right Ventricle  Amplitude: 4.6 mV, Impedance: 582 ohms, Threshold: 0.625 V at 0.5 msec  Episodes MS Episodes:  17     Percent Mode Switch:  <1%     Coumadin:  No Ventricular High Rate:  0     Atrial Pacing:  11%     Ventricular Pacing:  >99%  Parameters Mode:  DDDR     Lower Rate Limit:  60     Upper Rate Limit:  120 Paced AV Delay:  200     Sensed AV Delay:  150 Next Cardiology Appt Due:  12/22/2008 Tech Comments:  Longest mode switch 4:29 hours No changes Alma Friendly, LPN  June  7, 624THL [id number] AM

## 2010-02-21 NOTE — Assessment & Plan Note (Signed)
Summary: NEW PT TO ESTABH/DLO   Vital Signs:  Patient profile:   77 year old female Height:      64.75 inches Weight:      148.25 pounds BMI:     24.95 Temp:     98.0 degrees F oral Pulse rate:   76 / minute Pulse rhythm:   regular BP sitting:   130 / 90  (left arm) Cuff size:   regular  Vitals Entered By: Sherrian Divers CMA Deborra Medina) (September 07, 2009 10:52 AM) CC: new patient, establish care   History of Present Illness: 77 yo with history of peripheral vascular disease, s/p AAA  repair in 2009,  h/o complete heart block s/p pacemaker implant, herna repair in 2009 with post op atrial fib, HTN, HLD here to establish care with no complants.   1.  HTN- has been stable on Lisinopril-HCTZ 10-12.5 mg daily.  Followed by Dr. Rockey Situ as well. No HA, blurred vision, LE edema.  2.  Hyperlipidemia- on Simvasatin 20 mg daily, no h/o elevated LFTs or myalgias.  Due to have lipids rechecked, not fasitng today.  3.  Anxiety- lost her son, husband, then had multiple medical problems including an aortic anuerysm and complicated hernia repair.  Was placed on Paxil 10 mg daily at that time and feels like it has taken the edge off.  Less anxiety.  Denies symptoms of depression.  No SI or HI.  4.  h/o afib- post op hernia repair,  rate controlled.  On coumadin.  Denies any obvious sources of bleeding.  Does bruise easily.   Current Medications (verified): 1)  Lisinopril-Hydrochlorothiazide 10-12.5 Mg Tabs (Lisinopril-Hydrochlorothiazide) .... Take 1 By Mouth Once Daily 2)  Simvastatin 20 Mg Tabs (Simvastatin) .... Take One Tablet By Mouth Daily At Bedtime 3)  Paroxetine Hcl 10 Mg Tabs (Paroxetine Hcl) .... Take 1 By Mouth Once Daily 4)  Spiriva Handihaler 18 Mcg Caps (Tiotropium Bromide Monohydrate) .... As Needed 5)  Warfarin Sodium 5 Mg Tabs (Warfarin Sodium) .Marland Kitchen.. 1 Tab By Mouth Every Evening or As Directed By Anticoagulation Clinic 6)  B-12 7)  Sb Anti-Gas 180 Mg Caps (Simethicone) .... Take One  Tablet By Mouth As Needed  Allergies: 1)  ! Codeine 2)  ! Morphine  Past History:  Past Medical History: Last updated: 03/07/2009 AV BLOCK, COMPLETE (ICD-426.0) PACEMAKER (ICD-V45.Marland Kitchen01) ABDOMINAL AORTIC ANEURYSM (ICD-441.4) Hiatel Hernia  Past Surgical History: Last updated: 03/07/2009 anerysm growth on colon surgery ppm changed out Tonsillectomy  Family History: Last updated: 06/17/2008 Family History of Cancer:  Heart  Social History: Last updated: 06/17/2008 Retired  Widowed  Tobacco Use - Former.  Alcohol Use - yes Regular Exercise - no Drug Use - no  Risk Factors: Exercise: no (06/17/2008)  Risk Factors: Smoking Status: quit (06/17/2008)  Review of Systems      See HPI General:  Denies malaise. Eyes:  Denies blurring. ENT:  Denies difficulty swallowing. CV:  Denies chest pain or discomfort, difficulty breathing at night, difficulty breathing while lying down, and shortness of breath with exertion. Resp:  Denies shortness of breath. GI:  Denies abdominal pain, bloody stools, and change in bowel habits. GU:  Denies abnormal vaginal bleeding. MS:  Denies joint pain, joint redness, and joint swelling. Derm:  Denies rash. Neuro:  Denies headaches. Psych:  Denies anxiety and depression. Endo:  Denies cold intolerance and heat intolerance. Heme:  Denies abnormal bruising and bleeding.  Physical Exam  General:  Well developed, well nourished, in no acute distress. Head:  normal HEENT Eyes:  PERRLA/EOM intact; conjunctiva and lids normal. Ears:  External ear exam shows no significant lesions or deformities.  Otoscopic examination reveals clear canals, tympanic membranes are intact bilaterally without bulging, retraction, inflammation or discharge. Hearing is grossly normal bilaterally. Nose:  no external deformity.   Mouth:  no gingival abnormalities.   Neck:  flat neck veins; supple Lungs:  clear to auscultation Heart:  regular rate and rhythm with a  2/6 systolic murmur with a preserved split S2 Abdomen:  soft nontender without hepatomegaly or midline pulsation Msk:  Back normal, normal gait. Muscle strength and tone normal. Extremities:  no clubbing cyanosis or edema Neurologic:  alert and oriented grossly normal motor and sensory function Skin:  warm and dry Psych:  Normal affect.   Impression & Recommendations:  Problem # 1:  HYPERTENSION, BENIGN (ICD-401.1) Assessment Unchanged Well controlled on current meds. Her updated medication list for this problem includes:    Lisinopril-hydrochlorothiazide 10-12.5 Mg Tabs (Lisinopril-hydrochlorothiazide) .Marland Kitchen... Take 1 by mouth once daily  Problem # 2:  HYPERLIPIDEMIA-MIXED (ICD-272.4) Assessment: Unchanged Order fasting lipid panel, hepatic panel. Continue Simvastatin 20 mg daily. Her updated medication list for this problem includes:    Simvastatin 20 Mg Tabs (Simvastatin) .Marland Kitchen... Take one tablet by mouth daily at bedtime  Problem # 3:  ATRIAL FIBRILLATION (ICD-427.31) Assessment: Unchanged rate and rythmn controlled.  Continue coumadin. Her updated medication list for this problem includes:    Warfarin Sodium 5 Mg Tabs (Warfarin sodium) .Marland Kitchen... 1 tab by mouth every evening or as directed by anticoagulation clinic  Problem # 4:  ANXIETY STATE, UNSPECIFIED (ICD-300.00) Assessment: Improved Continue Paxil 10 mg daily. Her updated medication list for this problem includes:    Paroxetine Hcl 10 Mg Tabs (Paroxetine hcl) .Marland Kitchen... Take 1 by mouth once daily  Complete Medication List: 1)  Lisinopril-hydrochlorothiazide 10-12.5 Mg Tabs (Lisinopril-hydrochlorothiazide) .... Take 1 by mouth once daily 2)  Simvastatin 20 Mg Tabs (Simvastatin) .... Take one tablet by mouth daily at bedtime 3)  Paroxetine Hcl 10 Mg Tabs (Paroxetine hcl) .... Take 1 by mouth once daily 4)  Spiriva Handihaler 18 Mcg Caps (Tiotropium bromide monohydrate) .... As needed 5)  Warfarin Sodium 5 Mg Tabs (Warfarin sodium)  .Marland Kitchen.. 1 tab by mouth every evening or as directed by anticoagulation clinic 6)  B-12  7)  Sb Anti-gas 180 Mg Caps (Simethicone) .... Take one tablet by mouth as needed  Patient Instructions: 1)  Great to meet you, Ms. Rosebush. 2)  Please call your insurance company to see if they cover the Shingles vaccine.  Consider getting a mammogram( we would set that up for you). 3)  Come in at your convenience for lab visit (fasting)- BMET (401.1), fasting lipid panel, liver function test (272.4). Prescriptions: SIMVASTATIN 20 MG TABS (SIMVASTATIN) Take one tablet by mouth daily at bedtime  #90 x 6   Entered and Authorized by:   Arnette Norris MD   Signed by:   Arnette Norris MD on 09/07/2009   Method used:   Electronically to        [facility] (retail)       [address]       [place], [place]  [address]       Ph: [phone]       Fax: [phone]   RxID:   [id number] LISINOPRIL-HYDROCHLOROTHIAZIDE 10-12.5 MG TABS (LISINOPRIL-HYDROCHLOROTHIAZIDE) Take 1 by mouth once daily  #90 x 6   Entered and Authorized by:   Arnette Norris MD  Signed by:   Arnette Norris MD on 09/07/2009   Method used:   Electronically to        [facility] (retail)       [address]       [place], [place]  [address]       Ph: [phone]       Fax: [phone]   RxID:   [id number]   Current Allergies (reviewed today): ! CODEINE ! MORPHINE  Pneumovax Result Date:  08/27/2006 Pneumovax Result:  historical PAP Next Due:  Not Indicated   Prevention & Chronic Care Immunizations   Influenza vaccine: Not documented    Tetanus booster: Not documented    Pneumococcal vaccine: historical  (08/27/2006)   Pneumococcal vaccine due: None    H. zoster vaccine: Not documented   H. zoster vaccine deferral: Contraindicated  (09/07/2009)  Colorectal Screening   Hemoccult: Not documented   Hemoccult action/deferral: Not indicated  (09/07/2009)    Colonoscopy: Location:  [facility].     (10/07/2007)   Colonoscopy due: 09/2012  Other Screening   Pap smear: Not documented   Pap smear due: Not Indicated    Mammogram: Not documented   Mammogram action/deferral: Deferred  (09/07/2009)    DXA bone density scan: Not documented   DXA bone density action/deferral: Deferred  (09/07/2009)   Smoking status: quit  (06/17/2008)  Lipids   Total Cholesterol: Not documented   LDL: Not documented   LDL Direct: Not documented   HDL: Not documented   Triglycerides: Not documented    SGOT (AST): Not documented   SGPT (ALT): Not documented   Alkaline phosphatase: Not documented   Total bilirubin: Not documented  Hypertension   Last Blood Pressure: 130 / 90  (09/07/2009)   Serum creatinine: 1.18  (07/01/2007)   Serum potassium 3.8  (07/01/2007)  Self-Management Support :    Hypertension self-management support: Not documented    Lipid self-management support: Not documented

## 2010-02-21 NOTE — Medication Information (Signed)
Summary: CCR/AMD  Anticoagulant Therapy  Managed by: Freddrick March, RN, BSN PCP: [name] Supervising MD: Rockey Situ Indication 1: Atrial Fibrillation Lab Used: [facility] Site: [place] INR POC 2.7 INR RANGE 2.0-3.0  Dietary changes: no     Bleeding/hemorrhagic complications: no     Any changes in medication regimen? no     Any missed doses?: no       Is patient compliant with meds? yes       Allergies: 1)  ! Codeine 2)  ! Morphine  Anticoagulation Management History:      The patient is taking warfarin and comes in today for a routine follow up visit.  Positive risk factors for bleeding include an age of 76 years or older.  The bleeding index is 'intermediate risk'.  Positive CHADS2 values include Age > 85 years old.  Her last INR was 1.0.  Anticoagulation responsible provider: [facility].  INR POC: 2.7.  Cuvette Lot#: [id number].  Exp: 08/2010.    Anticoagulation Management Assessment/Plan:      The patient's current anticoagulation dose is Warfarin sodium 5 mg tabs: 1 tab by mouth every evening or as directed by Anticoagulation Clinic.  The target INR is 2.0-3.0.  The next INR is due 07/20/2009.  Results were reviewed/authorized by Freddrick March, RN, BSN.  She was notified by Freddrick March RN.         Prior Anticoagulation Instructions: INR 3.0  Take 1/2 tablet today then resume same dosage 1 tablet daily except 1.5 tablets on Saturdays.  Recheck in 2 weeks.    Current Anticoagulation Instructions: INR 2.7  Continue on same dosage 1 tablet daily except 1.5 tablets on Saturdays.  Recheck in 3 weeks.

## 2010-02-21 NOTE — Letter (Signed)
Summary: [facility] Patient Note   [facility] Patient Note   Imported By: Sallee Provencal 09/27/2009 14:45:11  _____________________________________________________________________  External Attachment:    Type:   Image     Comment:   External Document

## 2010-02-21 NOTE — Medication Information (Signed)
Summary: ccn  Anticoagulant Therapy  Managed by: Gabriel Cirri, RN, BSN PCP: [name] Supervising MD: Aundra Dubin MD, Marnee Sherrard Indication 1: Atrial Fibrillation INR POC 1.0 INR RANGE 2.0-3.0          Comments: pt has not started coumadin yet  Allergies: 1)  ! Codeine 2)  ! Morphine  Anticoagulation Management History:      The patient comes in today for her initial visit for anticoagulation therapy.  Positive risk factors for bleeding include an age of 75 years or older.  The bleeding index is 'intermediate risk'.  Positive CHADS2 values include Age > 32 years old.  Her last INR was 1.0.  Anticoagulation responsible provider: Aundra Dubin MD, Ziyana Morikawa.  INR POC: 1.0.    Anticoagulation Management Assessment/Plan:      The patient's current anticoagulation dose is Warfarin sodium 5 mg tabs: 1 tab by mouth every evening or as directed by Anticoagulation Clinic.  The next INR is due 05/27/2009.  Results were reviewed/authorized by Gabriel Cirri, RN, BSN.  She was notified by Gabriel Cirri, RN, BSN.         Current Anticoagulation Instructions: coumadin 5 mg daily

## 2010-02-21 NOTE — Procedures (Signed)
Summary: F4M/AMD   Visit Type:  Follow-up Referring Provider:  Mar Daring, MD Primary Provider:  [name]  CC:  "Doing well"..  History of Present Illness: Jessica [name] is seen in followup for pacemaker implantation for complete heart block.  she also has paroxysmal atrial fibrillation for which she takes Coumadin  She also  has a history of peripheral vascular disease, status post abdominal aortic aneurysm repair in 2009  herna repair in 2009 with post op atrial fib, HTN, prior TIA per the notes, carotid u/s showing mild carotid disease       .       Current Medications (verified): 1)  Lisinopril-Hydrochlorothiazide 10-12.5 Mg Tabs (Lisinopril-Hydrochlorothiazide) .... Take 1 By Mouth Once Daily 2)  Simvastatin 20 Mg Tabs (Simvastatin) .... Take One Tablet By Mouth Daily At Bedtime 3)  Paroxetine Hcl 10 Mg Tabs (Paroxetine Hcl) .... Take 1 By Mouth Once Daily 4)  Spiriva Handihaler 18 Mcg Caps (Tiotropium Bromide Monohydrate) .... As Needed 5)  Warfarin Sodium 5 Mg Tabs (Warfarin Sodium) .Marland Kitchen.. 1 Tab By Mouth Every Evening or As Directed By Anticoagulation Clinic 6)  B-12  Allergies (verified): 1)  ! Codeine 2)  ! Morphine  Past History:  Past Medical History: Last updated: 03/07/2009 AV BLOCK, COMPLETE (ICD-426.0) PACEMAKER (ICD-V45.Marland Kitchen01) ABDOMINAL AORTIC ANEURYSM (ICD-441.4) Hiatel Hernia  Past Surgical History: Last updated: 03/07/2009 anerysm growth on colon surgery ppm changed out Tonsillectomy  Family History: Last updated: 06/17/2008 Family History of Cancer:  Heart  Social History: Last updated: 06/17/2008 Retired  Widowed  Tobacco Use - Former.  Alcohol Use - yes Regular Exercise - no Drug Use - no  Risk Factors: Exercise: no (06/17/2008)  Risk Factors: Smoking Status: quit (06/17/2008)  Vital Signs:  Patient profile:   77 year old female Height:      65 inches Weight:      147 pounds BMI:     24.55 Pulse rate:   80 / minute BP  sitting:   132 / 82  (left arm) Cuff size:   regular  Vitals Entered By: [name], CMA (September 05, 2009 3:03 PM)  Physical Exam  General:  Well developed, well nourished, in no acute distress. Head:  normal HEENT Neck:  flat neck veins; supple Lungs:  clear to auscultation Heart:  regular rate and rhythm with a 2/6 systolic murmur with a preserved split S2 Abdomen:  soft nontender without hepatomegaly or midline pulsation Pulses:  intact distal pulses Extremities:  no clubbing cyanosis or edema Neurologic:  alert and oriented grossly normal motor and sensory function Skin:  warm and dry   PPM Specifications Following MD:  Virl Axe, MD     PPM Vendor:  [employer] Jude     PPM Model Number:  [id number]     PPM Serial Number:  [id number] DOI:  07/02/2007      Lead 1    Location: [facility]     DOI: 07/02/2007     Model #: [id number]     Serial #: [id number]     Status: active Lead 2    Location: [facility]     DOI: 07/02/2007     Model #: [id number]     Serial #: [id number]     Status: active   Indications:  CHB    PPM Follow Up Remote Check?  No Battery Voltage:  2.80 V     Battery Est. Longevity:  3.75 years     Pacer Dependent:  No  PPM Device Measurements Atrium  Amplitude: 3.0 mV, Impedance: .71 ohms, Threshold: 0.5 V at 0.5 msec Right Ventricle  Amplitude: 4.3 mV, Impedance: 545 ohms, Threshold: 0.625 V at 0.5 msec  Episodes MS Episodes:  22     Percent Mode Switch:  1.1%     Coumadin:  Yes Atrial Pacing:  20%     Ventricular Pacing:  100%  Parameters Mode:  DDDR     Lower Rate Limit:  60     Upper Rate Limit:  120 Paced AV Delay:  200     Sensed AV Delay:  150 Next Cardiology Appt Due:  02/22/2010 Tech Comments:  No parameter changes.  Device function normal.  22 mode switch episodes with the longest being 10:52 hours, + coumadin, and ventricular rates controlled.  ROV 6 months [place] [facility]. Alma Friendly, LPN  August 15, 624THL 3:25 PM   Impression & Recommendations:  Problem # 1:  AV  BLOCK, COMPLETE (ICD-426.0) the patient is device dependent. She is on auto capture. Her updated medication list for this problem includes:    Lisinopril-hydrochlorothiazide 10-12.5 Mg Tabs (Lisinopril-hydrochlorothiazide) .Marland Kitchen... Take 1 by mouth once daily    Warfarin Sodium 5 Mg Tabs (Warfarin sodium) .Marland Kitchen... 1 tab by mouth every evening or as directed by anticoagulation clinic  Problem # 2:  PACEMAKER (ICD-V45.Marland Kitchen01) Device parameters and data were reviewed and no changes were made;  her longevity seems to be relatively short with predicted remaining life of 4-5 years and a two-year history. She is on auto capture to maximize longevity  Problem # 3:  ATRIAL FIBRILLATION (ICD-427.31) She continues to have paroxysms of atrial fibrillation. We discussed using Pradaxa; is not covered by her insurance. Her updated medication list for this problem includes:    Warfarin Sodium 5 Mg Tabs (Warfarin sodium) .Marland Kitchen... 1 tab by mouth every evening or as directed by anticoagulation clinic  Problem # 4:  HYPERTENSION, BENIGN (ICD-401.1) well-controlled today on her current medications Her updated medication list for this problem includes:    Lisinopril-hydrochlorothiazide 10-12.5 Mg Tabs (Lisinopril-hydrochlorothiazide) .Marland Kitchen... Take 1 by mouth once daily

## 2010-02-21 NOTE — Assessment & Plan Note (Signed)
Summary: EC6/AMD   Visit Type:  Initial Consult Referring Jessica [name]:  Jessica Daring, MD Primary Jessica [name]:  Jessica [name]  CC:  c/o shortness of breath..  History of Present Illness: Jessica [name] has a history of peripheral vascular disease, status post abdominal aortic aneurysm repair in 2009,  history of complete heart block status post pacemaker implant, herna repair in 2009 with post op atrial fib, HTN, prior TIA per the notes, carotid u/s showing mild carotid disease, previous pains in her  neck radiating to her arms bilaterally occurring at night and awakening her from sleep, relieved by repositioning herself in bed. She presents for routine follow up.  She states that she has been doing well. She is active. She has problems with the hot weather and finds  it harder to breath. She has not been using her inhalers and she does not think that she needs them. She does have a low grade chronic cough. No chest pain or leg pain with ambulation. No lightheadedness, diaphoresis with activity.   EKG shows NSR with LBBB, rate 76 bpm.        .       Current Medications (verified): 1)  Lisinopril-Hydrochlorothiazide 10-12.5 Mg Tabs (Lisinopril-Hydrochlorothiazide) .... Take 1 By Mouth Once Daily 2)  Simvastatin 20 Mg Tabs (Simvastatin) .... Take One Tablet By Mouth Daily At Bedtime 3)  Paroxetine Hcl 10 Mg Tabs (Paroxetine Hcl) .... Take 1 By Mouth Once Daily 4)  Spiriva Handihaler 18 Mcg Caps (Tiotropium Bromide Monohydrate) .... As Needed 5)  Warfarin Sodium 5 Mg Tabs (Warfarin Sodium) .Marland Kitchen.. 1 Tab By Mouth Every Evening or As Directed By Anticoagulation Clinic 6)  B-12  Allergies (verified): 1)  ! Codeine 2)  ! Morphine  Past History:  Past Medical History: Last updated: 03/07/2009 AV BLOCK, COMPLETE (ICD-426.0) PACEMAKER (ICD-V45.Marland Kitchen01) ABDOMINAL AORTIC ANEURYSM (ICD-441.4) Hiatel Hernia  Past Surgical History: Last updated: 03/07/2009 anerysm growth on colon surgery ppm changed  out Tonsillectomy  Family History: Last updated: 06/17/2008 Family History of Cancer:  Heart  Social History: Last updated: 06/17/2008 Retired  Widowed  Tobacco Use - Former.  Alcohol Use - yes Regular Exercise - no Drug Use - no  Risk Factors: Exercise: no (06/17/2008)  Risk Factors: Smoking Status: quit (06/17/2008)  Review of Systems       The patient complains of dyspnea on exertion.  The patient denies fever, weight loss, weight gain, vision loss, decreased hearing, hoarseness, chest pain, syncope, peripheral edema, prolonged cough, abdominal pain, incontinence, muscle weakness, depression, and enlarged lymph nodes.    Vital Signs:  Patient profile:   77 year old female Height:      65 inches Weight:      148 pounds BMI:     24.72 Pulse rate:   80 / minute BP sitting:   147 / 74  (left arm) Cuff size:   regular  Vitals Entered By: [name], CMA (August 25, 2009 9:34 AM)  Physical Exam  General:  Well developed, well nourished, in no acute distress. Head:  normocephalic and atraumatic Neck:  Neck supple, no JVD. No masses, thyromegaly or abnormal cervical nodes. Lungs:  Mildly decreased BS throughout, otherwise clear Heart:  Non-displaced PMI, chest non-tender;distant heart sounds,  regular rate and rhythm, S1, S2 with II/VI SEM at RSB, no rubs or gallops. Carotid upstroke normal, no bruit.. Pedals normal pulses. No edema, no varicosities. Abdomen:  Bowel sounds positive; abdomen soft and non-tender without masses Msk:  Back normal, normal gait. Muscle  strength and tone normal. Pulses:  pulses normal in all 4 extremities Extremities:  No clubbing or cyanosis. Neurologic:  Alert and oriented x 3. Skin:  Intact without lesions or rashes. Psych:  Normal affect.   PPM Specifications Following MD:  Virl Axe, MD     PPM Vendor:  [employer]     PPM Model Number:  [id number]     PPM Serial Number:  [id number] DOI:  07/02/2007      Lead 1    Location: [facility]     DOI:  07/02/2007     Model #: [id number]     Serial #: [id number]     Status: active Lead 2    Location: [facility]     DOI: 07/02/2007     Model #: [id number]     Serial #: [id number]     Status: active   Indications:  CHB    PPM Follow Up Pacer Dependent:  No      Episodes Coumadin:  No  Parameters Mode:  DDDR     Lower Rate Limit:  60     Upper Rate Limit:  120 Paced AV Delay:  200     Sensed AV Delay:  150  Impression & Recommendations:  Problem # 1:  ATRIAL FIBRILLATION (ICD-427.31) She remains in NSR today. No signs of tachypalpitations. She is not any rate controling medications.  Will monitor a-fib by acer interrogation, by Dr. Caryl Comes  Her updated medication list for this problem includes:    Warfarin Sodium 5 Mg Tabs (Warfarin sodium) .Marland Kitchen... 1 tab by mouth every evening or as directed by anticoagulation clinic  Orders: EKG w/ Interpretation (93000)  Problem # 2:  ABDOMINAL AORTIC ANEURYSM (ICD-441.4) Known PVD. AAA repair in 2009. Mild carotid disease on u/s. Will try to obtain liver/lipids from Dr. Tonye Pearson office.  Problem # 3:  HYPERLIPIDEMIA-MIXED (B2193296.4) Will try to obtain old lipids. LDL goal is <70. Suspect she will need new labs for this year.  Will adjust meds accordingly.  Her updated medication list for this problem includes:    Simvastatin 20 Mg Tabs (Simvastatin) .Marland Kitchen... Take one tablet by mouth daily at bedtime  Problem # 4:  HYPERTENSION, BENIGN (ICD-401.1) BP is borderline high today. I have asked her to monitor her BP as an outpt and call us for consistent SBPs >140.   Her updated medication list for this problem includes:    Lisinopril-hydrochlorothiazide 10-12.5 Mg Tabs (Lisinopril-hydrochlorothiazide) .Marland Kitchen... Take 1 by mouth once daily  Patient Instructions: 1)  Your physician recommends that you schedule a follow-up appointment in: 6 months 2)  Your physician recommends that you continue on your current medications as directed. Please refer to the Current  Medication list given to you today.  Appended Document: EC6/AMD She has pulmonary nodules seen on old CT scan from 11/2007. F/u CT of chest was recommended to monitor these nodules. Will defer to Dr. Marjory Lies (for visit on 8/27) as to whether this follow up might be needed. She has a long smoking hx, stopped 12 years ago.  Appended Document: EC6/AMD Lipids from 05/04/2009 shows chol 197, LDL 108, HDL 48 Lipids are too high and need to make medication change  Appended Document: EC6/AMD Need to recheck lipids as last were one year ago.To be check by Dr. Marjory Lies If numbers still high like in 2010, will need to make medication change to crestor 10 to 20 mg, or lipitor 20 to 40 mg

## 2010-02-21 NOTE — Medication Information (Signed)
Summary: CCR/AMD  Anticoagulant Therapy  Managed by: Freddrick March, RN, BSN PCP: [name] Supervising MD: Rockey Situ Indication 1: Atrial Fibrillation Lab Used: [facility] [place] Site: [place] INR POC 3.0 INR RANGE 2.0-3.0  Dietary changes: no    Health status changes: no    Bleeding/hemorrhagic complications: no    Recent/future hospitalizations: no    Any changes in medication regimen? no    Recent/future dental: no  Any missed doses?: no       Is patient compliant with meds? yes       Allergies: 1)  ! Codeine 2)  ! Morphine  Anticoagulation Management History:      The patient is taking warfarin and comes in today for a routine follow up visit.  Positive risk factors for bleeding include an age of 75 years or older.  The bleeding index is 'intermediate risk'.  Positive CHADS2 values include Age > 31 years old.  Her last INR was 1.0.  Anticoagulation responsible provider: Keela Rubert.  INR POC: 3.0.  Exp: 05/2010.    Anticoagulation Management Assessment/Plan:      The patient's current anticoagulation dose is Warfarin sodium 5 mg tabs: 1 tab by mouth every evening or as directed by Anticoagulation Clinic.  The target INR is 2.0-3.0.  The next INR is due 06/16/2009.  Results were reviewed/authorized by Freddrick March, RN, BSN.  She was notified by Freddrick March RN.         Prior Anticoagulation Instructions: INR 2.6  Continue on same dosage 5mg  daily except 7.5mg  on Wednesdays and Saturdays.  Recheck in 1 week.  Current Anticoagulation Instructions: INR 3.0  Start taking 5mg  daily except 7.5mg  on Saturdays.  Recheck 10 days.

## 2010-02-21 NOTE — Assessment & Plan Note (Signed)
Summary: f81m   Visit Type:  Follow-up Referring Provider:  Mar Daring, MD Primary Provider:  [name]  CC:  no cp, sob always, and no edema.  History of Present Illness: . Jessica [name] is seen in followup for a pacemaker implanted for complete heart block now status post device generator replacement about a year ago. exercise tolerance is much improved since that time.  She is scheduled to undergo abdominal hernia surgery in the beginning of March. Thromboembolic risk factors notable for:     a.     Prior stroke.     b.     Hypertension.     c.     Congestive heart failure.     d.     Age.     Current Problems (verified): 1)  Carotid Artery Stenosis, Without Infarction  (ICD-433.10) 2)  Av Block, Complete  (ICD-426.0) 3)  Pacemaker  (ICD-V45.Marland Kitchen01) 4)  Abdominal Aortic Aneurysm  (ICD-441.4)  Current Medications (verified): 1)  Aspirin 81 Mg Tbec (Aspirin) .... Take One Tablet By Mouth Daily - On Hold 2)  Lisinopril-Hydrochlorothiazide 10-12.5 Mg Tabs (Lisinopril-Hydrochlorothiazide) .... Take 1 By Mouth Once Daily - On Hold 3)  Simvastatin 20 Mg Tabs (Simvastatin) .... Take One Tablet By Mouth Daily At Bedtime 4)  Paroxetine Hcl 10 Mg Tabs (Paroxetine Hcl) .... Take 1 By Mouth Once Daily 5)  Ventolin Hfa 108 (90 Base) Mcg/act Aers (Albuterol Sulfate) .... As Needed 6)  Spiriva Handihaler 18 Mcg Caps (Tiotropium Bromide Monohydrate) .... As Needed 7)  Flax Seed Oil 1000 Mg Caps (Flaxseed (Linseed)) .... Three Times A Day 8)  Multivitamins   Tabs (Multiple Vitamin) .... Once Daily -  On Hold  Allergies (verified): 1)  ! Codeine 2)  ! Morphine  Past History:  Family History: Last updated: 06/17/2008 Family History of Cancer:  Heart  Social History: Last updated: 06/17/2008 Retired  Widowed  Tobacco Use - Former.  Alcohol Use - yes Regular Exercise - no Drug Use - no  Risk Factors: Exercise: no (06/17/2008)  Risk Factors: Smoking Status: quit  (06/17/2008)  Past Medical History: AV BLOCK, COMPLETE (ICD-426.0) PACEMAKER (ICD-V45.Marland Kitchen01) ABDOMINAL AORTIC ANEURYSM (ICD-441.4) Hiatel Hernia  Past Surgical History: anerysm growth on colon surgery ppm changed out Tonsillectomy  Vital Signs:  Patient profile:   77 year old female Height:      65 inches Weight:      149 pounds BMI:     24.88 Pulse rate:   84 / minute Pulse rhythm:   regular BP sitting:   130 / 76  (left arm) Cuff size:   regular  Vitals Entered By: Philemon Kingdom (March 07, 2009 11:27 AM)  Physical Exam  General:  The patient was alert and oriented in no acute distress. HEENT Normal.  Neck veins were flat, carotids were brisk.  Lungs were clear.  Heart sounds were regular without murmurs or gallops.  Abdomen was soft with active bowel sounds. There is no clubbing cyanosis or edema. Skin Warm and dry    PPM Specifications Following MD:  Virl Axe, MD     PPM Vendor:  St Jude     PPM Model Number:  [id number]     PPM Serial Number:  [id number] PPM DOI:  07/02/2007      Lead 1    Location: [facility]     DOI: 07/02/2007     Model #: [id number]     Serial #: [id number]     Status: active Lead 2  Location: RV     DOI: 07/02/2007     Model #: [id number]     Serial #: [id number]     Status: active   Indications:  CHB    PPM Follow Up Remote Check?  No Battery Voltage:  2.79 V     Battery Est. Longevity:  8.75 years       PPM Device Measurements Atrium  Amplitude: 2.8 mV, Impedance: 450 ohms, Threshold: 0.5 V at 0.5 msec Right Ventricle  Amplitude: 5.2 mV, Impedance: 553 ohms, Threshold: 0.75 V at 0.5 msec  Episodes MS Episodes:  28     Percent Mode Switch:  <1%     Coumadin:  No Atrial Pacing:  20%     Ventricular Pacing:  100%  Parameters Mode:  DDDR     Lower Rate Limit:  60     Upper Rate Limit:  120 Paced AV Delay:  200     Sensed AV Delay:  150 Next Cardiology Appt Due:  08/22/2009 Tech Comments:  No parameter changes.  28 mode switch episodes the  longest 6:04 minutes.  She is not on coumadin. ROV 6 months [place]. Alma Friendly, LPN  February 14, [id number] 11:34 AM   Impression & Recommendations:  Problem # 1:  ATRIAL FIBRILLATION (ICD-427.31) Atrial fibrillation was detected by her device. He has multiple thromboembolic risk factors as noted above. She was immediately started on Coumadin. We discussed potential benefits as well as risks of oral anticoagulation therapy. We'll plan to revisit this about a month after her surgery in early April. We discussed the potential use of Pradaxa in its place. She would probably prefer that Her updated medication list for this problem includes:    Aspirin 81 Mg Tbec (Aspirin) .Marland Kitchen... Take one tablet by mouth daily - on hold  Problem # 2:  AV BLOCK, COMPLETE (ICD-426.0) status post pacemaker implant he  Problem # 3:  PACEMAKER (ICD-V45.Marland Kitchen01) Device parameters and data were reviewed and no changes were made

## 2010-02-21 NOTE — Consult Note (Signed)
Summary: [facility]   Imported By: Marilynne Drivers 03/22/2009 09:55:32  _____________________________________________________________________  External Attachment:    Type:   Image     Comment:   External Document

## 2010-02-21 NOTE — Medication Information (Signed)
Summary: CCR/AMD  Anticoagulant Therapy  Managed by: Freddrick March, RN, BSN PCP: [name] Supervising MD: Rockey Situ Indication 1: Atrial Fibrillation Lab Used: [facility] [place] Site: [place] INR POC 3.0 INR RANGE 2.0-3.0    Bleeding/hemorrhagic complications: no     Any changes in medication regimen? no     Any missed doses?: yes     Details: Missed 1/2 tablet on Sat, but took on Sunday an extra 1/2 tablet.    Is patient compliant with meds? yes       Allergies: 1)  ! Codeine 2)  ! Morphine  Anticoagulation Management History:      The patient is taking warfarin and comes in today for a routine follow up visit.  Positive risk factors for bleeding include an age of 75 years or older.  The bleeding index is 'intermediate risk'.  Positive CHADS2 values include Age > 58 years old.  Her last INR was 1.0.  Anticoagulation responsible provider: Kamiyah Kindel.  INR POC: 3.0.  Cuvette Lot#: [id number].  Exp: 08/2010.    Anticoagulation Management Assessment/Plan:      The patient's current anticoagulation dose is Warfarin sodium 5 mg tabs: 1 tab by mouth every evening or as directed by Anticoagulation Clinic.  The target INR is 2.0-3.0.  The next INR is due 06/29/2009.  Results were reviewed/authorized by Freddrick March, RN, BSN.  She was notified by Freddrick March RN.         Prior Anticoagulation Instructions: INR 3.0  Start taking 5mg  daily except 7.5mg  on Saturdays.  Recheck 10 days.    Current Anticoagulation Instructions: INR 3.0  Take 1/2 tablet today then resume same dosage 1 tablet daily except 1.5 tablets on Saturdays.  Recheck in 2 weeks.

## 2010-02-21 NOTE — Medication Information (Signed)
Summary: CCR/NE  Anticoagulant Therapy  Managed by: Freddrick March, RN, BSN PCP: [name] Supervising MD: Aundra Dubin MD, [place] Indication 1: Atrial Fibrillation Lab Used: [facility] [place] Site: [place] INR POC 2.1 INR RANGE 2.0-3.0  Dietary changes: no    Health status changes: no    Bleeding/hemorrhagic complications: no    Recent/future hospitalizations: no    Any changes in medication regimen? no    Recent/future dental: no  Any missed doses?: no       Is patient compliant with meds? yes       Allergies: 1)  ! Codeine 2)  ! Morphine  Anticoagulation Management History:      The patient is taking warfarin and comes in today for a routine follow up visit.  Positive risk factors for bleeding include an age of 75 years or older.  The bleeding index is 'intermediate risk'.  Positive CHADS2 values include History of HTN and Age > 60 years old.  Her last INR was 1.0.  Anticoagulation responsible Bashar Milam: Aundra Dubin MD, [place].  INR POC: 2.1.  Cuvette Lot#: [id number].  Exp: 10/2010.    Anticoagulation Management Assessment/Plan:      The patient's current anticoagulation dose is Warfarin sodium 5 mg tabs: 1 tab by mouth every evening or as directed by Anticoagulation Clinic.  The target INR is 2.0-3.0.  The next INR is due 10/19/2009.  Results were reviewed/authorized by Freddrick March, RN, BSN.  She was notified by Freddrick March RN.         Prior Anticoagulation Instructions: INR 2.3   Continue taking  1 tab daily except for 1.5 tabs on Saturday. Recheck in 4 weeks.   Current Anticoagulation Instructions: INR 2.1  Continue on same dosage 1 tablet daily except 1.5 tablets on Saturdays.   Recheck in 4 weeks.

## 2010-02-21 NOTE — Miscellaneous (Signed)
Summary: Flu vaccine  Clinical Lists Changes  Observations: Added new observation of FLU VAX: Historical (11/28/2009 10:14)      Influenza Immunization History:    Influenza # 1:  Historical (11/28/2009) Received form from [facility], [place], [place]

## 2010-02-23 NOTE — Medication Information (Signed)
Summary: Jessica [name]  Anticoagulant Therapy  Managed by: Tula Nakayama, RN, BSN PCP: [name] Supervising MD: Rockey Situ Indication 1: Atrial Fibrillation Lab Used: [facility] [place] Site: [place] INR POC 1.6 INR RANGE 2.0-3.0  Dietary changes: no    Health status changes: yes       Details: Exacerbation of COPD last week  Bleeding/hemorrhagic complications: no    Recent/future hospitalizations: no    Any changes in medication regimen? no    Recent/future dental: no  Any missed doses?: yes     Details: Missed Sunday's dose   Is patient compliant with meds? yes       Allergies: 1)  ! Codeine 2)  ! Morphine  Anticoagulation Management History:      The patient is taking warfarin and comes in today for a routine follow up visit.  Positive risk factors for bleeding include an age of 75 years or older.  The bleeding index is 'intermediate risk'.  Positive CHADS2 values include History of HTN and Age > 46 years old.  Her last INR was 1.0.  Anticoagulation responsible provider: Saida Lonon.  INR POC: 1.6.  Cuvette Lot#: [id number].  Exp: 02/2011.    Anticoagulation Management Assessment/Plan:      The patient's current anticoagulation dose is Warfarin sodium 5 mg tabs: 1 tab by mouth every evening or as directed by Anticoagulation Clinic.  The target INR is 2.0-3.0.  The next INR is due 03/01/2010.  Anticoagulation instructions were given to patient.  Results were reviewed/authorized by Tula Nakayama, RN, BSN.  She was notified by Tula Nakayama, RN, BSN.         Prior Anticoagulation Instructions: INR 2.4  Continue on same dosage 5mg  daily except 7.5mg  on Saturdays.  Recheck in 4 weeks.    Current Anticoagulation Instructions: INR 1.6 Today take 1.5 pills then resume 1 pill everyday except 1.5 pills on Saturdays. Recheck in 3 weeks.

## 2010-02-23 NOTE — Medication Information (Signed)
Summary: rov/tm  Anticoagulant Therapy  Managed by: Freddrick March, RN, BSN PCP: [name] Supervising MD: Rockey Situ Indication 1: Atrial Fibrillation Lab Used: [facility] [place] Site: [place] INR POC 2.4 INR RANGE 2.0-3.0  Dietary changes: yes       Details: Ate turnip greens yesterday  Health status changes: no    Bleeding/hemorrhagic complications: no    Recent/future hospitalizations: no    Any changes in medication regimen? no    Recent/future dental: no  Any missed doses?: no       Is patient compliant with meds? yes       Allergies: 1)  ! Codeine 2)  ! Morphine  Anticoagulation Management History:      The patient is taking warfarin and comes in today for a routine follow up visit.  Positive risk factors for bleeding include an age of 75 years or older.  The bleeding index is 'intermediate risk'.  Positive CHADS2 values include History of HTN and Age > 24 years old.  Her last INR was 1.0.  Anticoagulation responsible provider: Jadea Shiffer.  INR POC: 2.4.  Cuvette Lot#: [id number].  Exp: 12/2010.    Anticoagulation Management Assessment/Plan:      The patient's current anticoagulation dose is Warfarin sodium 5 mg tabs: 1 tab by mouth every evening or as directed by Anticoagulation Clinic.  The target INR is 2.0-3.0.  The next INR is due 02/08/2010.  Anticoagulation instructions were given to patient.  Results were reviewed/authorized by Freddrick March, RN, BSN.  She was notified by Freddrick March RN.         Prior Anticoagulation Instructions: INR 2.3 Continue 5mg s everyday except 7.5mg s on Saturdays. Recheck in 4 weeks.   Current Anticoagulation Instructions: INR 2.4  Continue on same dosage 5mg  daily except 7.5mg  on Saturdays.  Recheck in 4 weeks.

## 2010-03-01 ENCOUNTER — Encounter (INDEPENDENT_AMBULATORY_CARE_PROVIDER_SITE_OTHER): Payer: MEDICARE

## 2010-03-01 ENCOUNTER — Encounter: Payer: Self-pay | Admitting: Cardiovascular Disease

## 2010-03-01 DIAGNOSIS — I4891 Unspecified atrial fibrillation: Secondary | ICD-10-CM

## 2010-03-01 DIAGNOSIS — Z7901 Long term (current) use of anticoagulants: Secondary | ICD-10-CM

## 2010-03-01 LAB — CONVERTED CEMR LAB: POC INR: 2.1

## 2010-03-02 ENCOUNTER — Encounter: Payer: Self-pay | Admitting: Internal Medicine

## 2010-03-02 ENCOUNTER — Encounter (INDEPENDENT_AMBULATORY_CARE_PROVIDER_SITE_OTHER): Payer: MEDICARE

## 2010-03-02 DIAGNOSIS — I442 Atrioventricular block, complete: Secondary | ICD-10-CM

## 2010-03-09 NOTE — Procedures (Signed)
Summary: PACER/AMD/NR   Current Medications (verified): 1)  Lisinopril-Hydrochlorothiazide 10-12.5 Mg Tabs (Lisinopril-Hydrochlorothiazide) .... Take 1 By Mouth Once Daily 2)  Simvastatin 20 Mg Tabs (Simvastatin) .... Take One Tablet By Mouth Daily At Bedtime 3)  Paroxetine Hcl 10 Mg Tabs (Paroxetine Hcl) .... Take 1 By Mouth Once Daily 4)  Spiriva Handihaler 18 Mcg Caps (Tiotropium Bromide Monohydrate) .... As Needed 5)  Warfarin Sodium 5 Mg Tabs (Warfarin Sodium) .Marland Kitchen.. 1 Tab By Mouth Every Evening or As Directed By Anticoagulation Clinic 6)  B-12 7)  Sb Anti-Gas 180 Mg Caps (Simethicone) .... Take One Tablet By Mouth As Needed  Allergies (verified): 1)  ! Codeine 2)  ! Morphine   PPM Specifications Following MD:  Virl Axe, MD     PPM Vendor:  St Jude     PPM Model Number:  [id number]     PPM Serial Number:  [id number] PPM DOI:  07/02/2007      Lead 1    Location: [facility]     DOI: 07/02/2007     Model #: [id number]     Serial #: [id number]     Status: active Lead 2    Location: RV     DOI: 07/02/2007     Model #: [id number]     Serial #: [id number]     Status: active   Indications:  CHB    PPM Follow Up Remote Check?  No Battery Voltage:  2.79 V     Battery Est. Longevity:  3.5 years     Pacer Dependent:  Yes       PPM Device Measurements Atrium  Amplitude: 2.9 mV, Impedance: 434 ohms, Threshold: 0.5 V at 0.5 msec Right Ventricle  Amplitude: 3.0 mV, Impedance: 558 ohms, Threshold: 0.625 V at 0.5 msec  Episodes MS Episodes:  55     Percent Mode Switch:  2.1%     Coumadin:  Yes Atrial Pacing:  29     Ventricular Pacing:  99  Parameters Mode:  DDDR     Lower Rate Limit:  60     Upper Rate Limit:  120 Paced AV Delay:  200     Sensed AV Delay:  150 Next Cardiology Appt Due:  08/23/2010 Tech Comments:  2.1% A-fib noted with controlled ventricular rates, + coumadin.  The longest episode was 13 hours.  R-waves chronic @ 3.0-5.63mV. No parameter changes.  ROV 6 months with Dr. Caryl Comes in [place]. Alma Friendly, LPN  February  9, [id number] 11:13 AM

## 2010-03-09 NOTE — Medication Information (Signed)
Summary: [facility]  Anticoagulant Therapy  Managed by: Freddrick March, RN, BSN PCP: [name] Supervising MD: Rockey Situ Indication 1: Atrial Fibrillation Lab Used: [facility] [place] Site: [place] INR POC 2.1 INR RANGE 2.0-3.0  Dietary changes: no    Health status changes: no    Bleeding/hemorrhagic complications: no    Recent/future hospitalizations: no    Any changes in medication regimen? yes       Details: Folic acid started  Recent/future dental: no  Any missed doses?: no       Is patient compliant with meds? yes       Allergies: 1)  ! Codeine 2)  ! Morphine  Anticoagulation Management History:      The patient is taking warfarin and comes in today for a routine follow up visit.  Positive risk factors for bleeding include an age of 75 years or older.  The bleeding index is 'intermediate risk'.  Positive CHADS2 values include History of HTN and Age > 62 years old.  Her last INR was 1.0.  Anticoagulation responsible provider: Dollie Mayse.  INR POC: 2.1.  Cuvette Lot#: [id number].  Exp: 02/2011.    Anticoagulation Management Assessment/Plan:      The patient's current anticoagulation dose is Warfarin sodium 5 mg tabs: 1 tab by mouth every evening or as directed by Anticoagulation Clinic.  The target INR is 2.0-3.0.  The next INR is due 03/29/2010.  Anticoagulation instructions were given to patient.  Results were reviewed/authorized by Freddrick March, RN, BSN.  She was notified by Freddrick March RN.         Prior Anticoagulation Instructions: INR 1.6 Today take 1.5 pills then resume 1 pill everyday except 1.5 pills on Saturdays. Recheck in 3 weeks.   Current Anticoagulation Instructions: INR 2.1  Continue on same dosage 1 tablet daily except 1.5 tablets on Saturdays.  Recheck in 4 weeks.

## 2010-03-29 ENCOUNTER — Encounter: Payer: Self-pay | Admitting: Internal Medicine

## 2010-03-29 ENCOUNTER — Encounter (INDEPENDENT_AMBULATORY_CARE_PROVIDER_SITE_OTHER): Payer: MEDICARE

## 2010-03-29 DIAGNOSIS — I4891 Unspecified atrial fibrillation: Secondary | ICD-10-CM

## 2010-03-29 DIAGNOSIS — Z7901 Long term (current) use of anticoagulants: Secondary | ICD-10-CM

## 2010-03-29 LAB — CONVERTED CEMR LAB: POC INR: 1.9

## 2010-03-30 NOTE — Cardiovascular Report (Signed)
Summary: Office Visit   Office Visit   Imported By: Sallee Provencal 03/20/2010 15:28:29  _____________________________________________________________________  External Attachment:    Type:   Image     Comment:   External Document

## 2010-04-04 NOTE — Medication Information (Signed)
Summary: rov/ewj  Anticoagulant Therapy  Managed by: Tula Nakayama, RN, BSN PCP: [name] Supervising MD: Haroldine Laws MD, Quillian Quince Indication 1: Atrial Fibrillation Lab Used: [facility] [place] Site: [place] INR POC 1.9 INR RANGE 2.0-3.0  Dietary changes: yes       Details: Did eat some extra leafy veggies  Health status changes: no    Bleeding/hemorrhagic complications: no    Recent/future hospitalizations: no    Any changes in medication regimen? yes       Details: Will start taking OTC vitamin for brain  Recent/future dental: no  Any missed doses?: no       Is patient compliant with meds? yes       Allergies: 1)  ! Codeine 2)  ! Morphine  Anticoagulation Management History:      The patient is taking warfarin and comes in today for a routine follow up visit.  Positive risk factors for bleeding include an age of 75 years or older.  The bleeding index is 'intermediate risk'.  Positive CHADS2 values include History of HTN and Age > 24 years old.  Her last INR was 1.0.  Anticoagulation responsible provider: Tyaire Odem MD, Quillian Quince.  INR POC: 1.9.  Cuvette Lot#: [id number].  Exp: 01/2011.    Anticoagulation Management Assessment/Plan:      The patient's current anticoagulation dose is Warfarin sodium 5 mg tabs: 1 tab by mouth every evening or as directed by Anticoagulation Clinic.  The target INR is 2.0-3.0.  The next INR is due 04/19/2010.  Anticoagulation instructions were given to patient.  Results were reviewed/authorized by Tula Nakayama, RN, BSN.  She was notified by Tula Nakayama, RN, BSN.         Prior Anticoagulation Instructions: INR 2.1  Continue on same dosage 1 tablet daily except 1.5 tablets on Saturdays.  Recheck in 4 weeks.    Current Anticoagulation Instructions: INR 1.9 Today take 1.5 pills then resume 1 pill everyday except 1.5 pills on Saturdays. Recheck in 3 weeks.

## 2010-04-12 ENCOUNTER — Encounter: Payer: Self-pay | Admitting: Cardiovascular Disease

## 2010-04-12 DIAGNOSIS — Z7901 Long term (current) use of anticoagulants: Secondary | ICD-10-CM | POA: Insufficient documentation

## 2010-04-12 DIAGNOSIS — I4891 Unspecified atrial fibrillation: Secondary | ICD-10-CM

## 2010-04-12 LAB — CBC
HCT: 38.9 % (ref 36.0–46.0)
Hemoglobin: 13.1 g/dL (ref 12.0–15.0)
MCHC: 33.6 g/dL (ref 30.0–36.0)
MCV: 92.7 fL (ref 78.0–100.0)
Platelets: 148 10*3/uL — ABNORMAL LOW (ref 150–400)
RBC: 4.19 MIL/uL (ref 3.87–5.11)
RDW: 14.1 % (ref 11.5–15.5)
WBC: 6.5 10*3/uL (ref 4.0–10.5)

## 2010-04-12 LAB — BASIC METABOLIC PANEL
BUN: 24 mg/dL — ABNORMAL HIGH (ref 6–23)
CO2: 29 mEq/L (ref 19–32)
Calcium: 9.5 mg/dL (ref 8.4–10.5)
Chloride: 101 mEq/L (ref 96–112)
Creatinine, Ser: 1.47 mg/dL — ABNORMAL HIGH (ref 0.4–1.2)
GFR calc Af Amer: 42 mL/min — ABNORMAL LOW (ref >60)
GFR calc non Af Amer: 34 mL/min — ABNORMAL LOW (ref >60)
Glucose, Bld: 108 mg/dL — ABNORMAL HIGH (ref 70–99)
Potassium: 4.6 mEq/L (ref 3.5–5.1)
Sodium: 138 mEq/L (ref 135–145)

## 2010-04-12 LAB — DIFFERENTIAL
Basophils Absolute: 0 10*3/uL (ref 0.0–0.1)
Basophils Relative: 1 % (ref 0–1)
Eosinophils Absolute: 0.1 10*3/uL (ref 0.0–0.7)
Eosinophils Relative: 2 % (ref 0–5)
Lymphocytes Relative: 26 % (ref 12–46)
Lymphs Abs: 1.7 10*3/uL (ref 0.7–4.0)
Monocytes Absolute: 0.5 10*3/uL (ref 0.1–1.0)
Monocytes Relative: 8 % (ref 3–12)
Neutro Abs: 4.1 10*3/uL (ref 1.7–7.7)
Neutrophils Relative %: 64 % (ref 43–77)

## 2010-04-14 ENCOUNTER — Other Ambulatory Visit: Payer: Self-pay | Admitting: Family Medicine

## 2010-04-14 ENCOUNTER — Ambulatory Visit (INDEPENDENT_AMBULATORY_CARE_PROVIDER_SITE_OTHER)
Admission: RE | Admit: 2010-04-14 | Discharge: 2010-04-14 | Disposition: A | Discharge status: Home / Self Care | Payer: MEDICARE | Source: Ambulatory Visit | Attending: Family Medicine | Admitting: Family Medicine

## 2010-04-14 ENCOUNTER — Encounter: Payer: Self-pay | Admitting: *Deleted

## 2010-04-14 ENCOUNTER — Encounter: Payer: Self-pay | Admitting: Family Medicine

## 2010-04-14 ENCOUNTER — Ambulatory Visit (INDEPENDENT_AMBULATORY_CARE_PROVIDER_SITE_OTHER): Payer: MEDICARE | Admitting: Family Medicine

## 2010-04-14 ENCOUNTER — Other Ambulatory Visit: Payer: Self-pay

## 2010-04-14 VITALS — BP 140/82 | HR 76 | Temp 98.7°F | Ht 67.0 in | Wt 150.4 lb

## 2010-04-14 DIAGNOSIS — M542 Cervicalgia: Secondary | ICD-10-CM

## 2010-04-14 MED ORDER — CYCLOBENZAPRINE HCL 5 MG PO TABS: 5.0000 mg | ORAL_TABLET | Freq: Two times a day (BID) | ORAL | Status: DC | PRN

## 2010-04-14 NOTE — Progress Notes (Signed)
  Subjective:    Patient ID: Jessica [name], female    DOB: 08/05/1929, 77 y.o.   MRN: [id number]  HPI Neck pain.  H/o carotid artery stenosis. Location: upper neck, bilateral Onset/Timing: last week, woke up with it Severity/Quality: very sore, 8/10 when she bends or turns her neck Worse/Better by: keeping head straight. Associated Symptoms: none.   Meds, vitals, and allergies reviewed.    Review of Systems ROS Weight loss: none Worse at rest: no Fever: none Change in bowel or bladder function: Loss of strength loss or sensation: none-no UE radiculopathy  Meds, vitals, and allergies reviewed.     Objective:   Physical Exam    NAD  NCAT Neck supple, TTP over C2/C3, tight trapezius muscles bilaterally. No bruits over carotids, upstroke normal. MMM RRR CTAB EXT w/o weakness, DTR wnl, sensation wnl spurling neg.     Assessment & Plan:

## 2010-04-14 NOTE — Assessment & Plan Note (Signed)
Seems most consistent with muscle strain- cervical and trapezius but given her level of pain and age, I am concerned about compression fx although no red flag signs presents. Rx given for flexeril- advised to use with caution. Cervical spine films.

## 2010-04-17 LAB — BASIC METABOLIC PANEL
BUN: 11 mg/dL (ref 6–23)
BUN: 17 mg/dL (ref 6–23)
BUN: 22 mg/dL (ref 6–23)
CO2: 26 mEq/L (ref 19–32)
CO2: 28 mEq/L (ref 19–32)
CO2: 30 mEq/L (ref 19–32)
Calcium: 8.3 mg/dL — ABNORMAL LOW (ref 8.4–10.5)
Calcium: 8.5 mg/dL (ref 8.4–10.5)
Calcium: 8.6 mg/dL (ref 8.4–10.5)
Chloride: 105 mEq/L (ref 96–112)
Chloride: 105 mEq/L (ref 96–112)
Chloride: 106 mEq/L (ref 96–112)
Creatinine, Ser: 1.22 mg/dL — ABNORMAL HIGH (ref 0.4–1.2)
Creatinine, Ser: 1.29 mg/dL — ABNORMAL HIGH (ref 0.4–1.2)
Creatinine, Ser: 1.31 mg/dL — ABNORMAL HIGH (ref 0.4–1.2)
GFR calc Af Amer: 48 mL/min — ABNORMAL LOW (ref >60)
GFR calc Af Amer: 48 mL/min — ABNORMAL LOW (ref >60)
GFR calc Af Amer: 52 mL/min — ABNORMAL LOW (ref >60)
GFR calc non Af Amer: 39 mL/min — ABNORMAL LOW (ref >60)
GFR calc non Af Amer: 40 mL/min — ABNORMAL LOW (ref >60)
GFR calc non Af Amer: 43 mL/min — ABNORMAL LOW (ref >60)
Glucose, Bld: 105 mg/dL — ABNORMAL HIGH (ref 70–99)
Glucose, Bld: 119 mg/dL — ABNORMAL HIGH (ref 70–99)
Glucose, Bld: 125 mg/dL — ABNORMAL HIGH (ref 70–99)
Potassium: 3.8 mEq/L (ref 3.5–5.1)
Potassium: 4.1 mEq/L (ref 3.5–5.1)
Potassium: 4.6 mEq/L (ref 3.5–5.1)
Sodium: 136 mEq/L (ref 135–145)
Sodium: 138 mEq/L (ref 135–145)
Sodium: 140 mEq/L (ref 135–145)

## 2010-04-17 LAB — CBC
HCT: 29.4 % — ABNORMAL LOW (ref 36.0–46.0)
HCT: 31.5 % — ABNORMAL LOW (ref 36.0–46.0)
Hemoglobin: 10.5 g/dL — ABNORMAL LOW (ref 12.0–15.0)
Hemoglobin: 9.5 g/dL — ABNORMAL LOW (ref 12.0–15.0)
MCHC: 32.3 g/dL (ref 30.0–36.0)
MCHC: 33.5 g/dL (ref 30.0–36.0)
MCV: 93.1 fL (ref 78.0–100.0)
MCV: 95.3 fL (ref 78.0–100.0)
Platelets: 105 10*3/uL — ABNORMAL LOW (ref 150–400)
Platelets: 131 10*3/uL — ABNORMAL LOW (ref 150–400)
RBC: 3.08 MIL/uL — ABNORMAL LOW (ref 3.87–5.11)
RBC: 3.38 MIL/uL — ABNORMAL LOW (ref 3.87–5.11)
RDW: 13.9 % (ref 11.5–15.5)
RDW: 14.1 % (ref 11.5–15.5)
WBC: 5.5 10*3/uL (ref 4.0–10.5)
WBC: 7.7 10*3/uL (ref 4.0–10.5)

## 2010-04-17 LAB — MRSA PCR SCREENING: MRSA by PCR: NEGATIVE

## 2010-04-19 ENCOUNTER — Ambulatory Visit (INDEPENDENT_AMBULATORY_CARE_PROVIDER_SITE_OTHER): Payer: MEDICARE | Admitting: Emergency Medicine

## 2010-04-19 DIAGNOSIS — I4891 Unspecified atrial fibrillation: Secondary | ICD-10-CM

## 2010-04-19 DIAGNOSIS — Z7901 Long term (current) use of anticoagulants: Secondary | ICD-10-CM

## 2010-04-19 LAB — POCT INR: INR: 1.8

## 2010-04-19 NOTE — Patient Instructions (Signed)
Take 1 tablet tonight, then start taking 1 tablet daily except 1.5 tablets on Wednesdays and Saturdays.  Recheck in 3 weeks

## 2010-04-26 LAB — CBC
HCT: 38.6 % (ref 36.0–46.0)
Hemoglobin: 13.3 g/dL (ref 12.0–15.0)
MCHC: 34.4 g/dL (ref 30.0–36.0)
MCV: 93.8 fL (ref 78.0–100.0)
Platelets: 147 10*3/uL — ABNORMAL LOW (ref 150–400)
RBC: 4.11 MIL/uL (ref 3.87–5.11)
RDW: 14.1 % (ref 11.5–15.5)
WBC: 6.9 10*3/uL (ref 4.0–10.5)

## 2010-04-26 LAB — DIFFERENTIAL
Basophils Absolute: 0.1 10*3/uL (ref 0.0–0.1)
Basophils Relative: 1 % (ref 0–1)
Eosinophils Absolute: 0.1 10*3/uL (ref 0.0–0.7)
Eosinophils Relative: 2 % (ref 0–5)
Lymphocytes Relative: 27 % (ref 12–46)
Lymphs Abs: 1.9 10*3/uL (ref 0.7–4.0)
Monocytes Absolute: 0.5 10*3/uL (ref 0.1–1.0)
Monocytes Relative: 7 % (ref 3–12)
Neutro Abs: 4.3 10*3/uL (ref 1.7–7.7)
Neutrophils Relative %: 63 % (ref 43–77)

## 2010-04-26 LAB — BASIC METABOLIC PANEL
BUN: 26 mg/dL — ABNORMAL HIGH (ref 6–23)
CO2: 29 mEq/L (ref 19–32)
Calcium: 9.5 mg/dL (ref 8.4–10.5)
Chloride: 100 mEq/L (ref 96–112)
Creatinine, Ser: 1.71 mg/dL — ABNORMAL HIGH (ref 0.4–1.2)
GFR calc Af Amer: 35 mL/min — ABNORMAL LOW (ref >60)
GFR calc non Af Amer: 29 mL/min — ABNORMAL LOW (ref >60)
Glucose, Bld: 93 mg/dL (ref 70–99)
Potassium: 4.1 mEq/L (ref 3.5–5.1)
Sodium: 137 mEq/L (ref 135–145)

## 2010-05-10 ENCOUNTER — Ambulatory Visit (INDEPENDENT_AMBULATORY_CARE_PROVIDER_SITE_OTHER): Payer: MEDICARE | Admitting: Emergency Medicine

## 2010-05-10 DIAGNOSIS — Z7901 Long term (current) use of anticoagulants: Secondary | ICD-10-CM

## 2010-05-10 DIAGNOSIS — I4891 Unspecified atrial fibrillation: Secondary | ICD-10-CM

## 2010-05-10 LAB — POCT INR: INR: 2.4

## 2010-06-02 ENCOUNTER — Other Ambulatory Visit: Payer: Self-pay

## 2010-06-02 ENCOUNTER — Other Ambulatory Visit: Payer: Self-pay | Admitting: *Deleted

## 2010-06-02 MED ORDER — WARFARIN SODIUM 5 MG PO TABS: 5.0000 mg | ORAL_TABLET | ORAL | Status: DC

## 2010-06-02 MED ORDER — ALBUTEROL SULFATE HFA 108 (90 BASE) MCG/ACT IN AERS: INHALATION_SPRAY | RESPIRATORY_TRACT | Status: DC

## 2010-06-06 ENCOUNTER — Telehealth: Payer: Self-pay | Admitting: *Deleted

## 2010-06-06 NOTE — Assessment & Plan Note (Signed)
[facility] OFFICE NOTE   Jessica, [name]                       MRN:          [id number]  DATE:12/02/2007                            DOB:          April 19, 1929    Jessica [name] returns today for followup of abnormal chest x-ray and CT  scan.  Dr. Caryl Comes saw her in the [facility] and re-ordered the CT.   CT done on November 26, 2007, shows stable lung nodules.  At this point  in time, I do not think we need further followup.   She has been through a whirlwind of activity with an abdominal aortic  aneurysm repair with an aorto-bi-iliac bypass graft by Dr. Kellie Simmering.  Followup has been completed and she has improved blood flow to her legs.  Other than trying to get her weight back up, she is doing remarkably  well.   During the evaluation of the aneurysm which we found surreptitiously on  a CT of the chest, she had a mucinous tumor of the appendix.  This was  removed by Dr. Neldon Mc the end of September.   She is doing well otherwise.   CURRENT MEDICATIONS:  1. Aspirin 81 mg a day.  2. Lisinopril Hydrochlorothiazide 10/12.5 daily.  3. Simvastatin 20 mg a day.  4. Paroxetine 10 mg a day.  5. Multivitamin daily.   PHYSICAL EXAMINATION:  Her blood pressure today is 112/66, her heart  rate is 84 and regular, weight is 122, down from 147 this summer.  She  looks remarkably good for what she has been through.  Skin is warm and  dry.  Lungs are clear.  Heart reveals a regular rate and rhythm.  Abdominal exam is soft.  The extremities reveal no edema.  She has good  pulses, 2+ dorsalis pedis pulses bilaterally and 1+ posterior tibialis.   I am delighted Jessica [name] is doing well.  We have cleared her on her CT.  I will see her back in 6 months.     Thomas C. Verl Blalock, MD, [facility]  Electronically Signed    TCW/MedQ  DD: 12/02/2007  DT: 12/03/2007  Job #: [id number]   cc:   Jessica School. Kimber Relic, MD

## 2010-06-06 NOTE — Consult Note (Signed)
Jessica [name], Jessica [name]                ACCOUNT NO.:  [id number]   MEDICAL RECORD NO.:  [id number]          PATIENT TYPE:  INP   LOCATION:  2312                         FACILITY:  [facility]   PHYSICIAN:  Haywood Lasso, M.D.DATE OF BIRTH:  [birth year]   DATE OF CONSULTATION:  DATE OF DISCHARGE:                                 CONSULTATION   REASON FOR CONSULTATION:  Jessica [name].   HISTORY OF PRESENT ILLNESS:  Jessica [name] is a 77 year old lady getting  ready to have an AAA repair by Dr. Mamie Nick.  On her preoperative  evaluation, she was found to have a large apparently cystic [name], which  seemed to be rising perhaps from the appendix.  He asked me to be  available and evaluate this intraoperatively.   Prior to surgery, I reviewed her CT scan and indeed there is about a 7-  cm [name] that to me appeared to be involving or adjacent to the cecum and  appendix.  It was not possible to tell what it was rising from.  There  was no evidence of any bowel obstruction.   Prior to surgery, I discussed the situation with the patient as well as  her son.  Our plan was for Dr. Kellie Simmering to open the abdomen and for me to  take a look at this.  If this looked like it could be taken out without  any significant contamination, then we would resect it.  If it appeared  that it needed to be a right colon, then Dr. Kellie Simmering and I would discuss  whether it would be more appropriate to do a right colectomy and come  back later for the AAA repair or simply go ahead with a AAA repair and  come back later for the Jessica [name].   Dr. Kellie Simmering opened the abdomen and I explored it with him.  There was  about a 7-cm [name] that seemed to be attached or rising from the lateral  aspect of the cecum.  It was well-circumscribed and did not appear to be  invading anything.  It was right at the base of the appendix as well.  It seemed somewhat cystic, but not necrotic.  It was mobile and looked  like there were some lateral  attachments and it would come up easily.  However, resecting this would require a right colectomy.   After discussion with Dr. Kellie Simmering, we felt that the aneurysm repair was  the most , successfully; so at this point, I scrubbed out and Dr. Kellie Simmering  proceeded with the aneurysm repair.  We will plan to follow her up in  the office in 3-4 weeks and then schedule her for an elective right  colectomy at some point in time.      Haywood Lasso, M.D.  Electronically Signed     CJS/MEDQ  D:  08/14/2007  T:  08/14/2007  Job:  [id number]   cc:   Nelda Severe. Kellie Simmering, M.D.

## 2010-06-06 NOTE — Discharge Summary (Signed)
Jessica [name], Jessica [name]                ACCOUNT NO.:  [id number]   MEDICAL RECORD NO.:  [id number]          PATIENT TYPE:  INP   LOCATION:  2918                         FACILITY:  [facility]   PHYSICIAN:  Deboraha Sprang, MD, FACCDATE OF BIRTH:  Aug 30, 1929   DATE OF ADMISSION:  07/02/2007  DATE OF DISCHARGE:  07/04/2007                               DISCHARGE SUMMARY   ALLERGIES:  This patient has an allergy to CODEINE.   FINAL DIAGNOSES:  1. Discharging day #2 status post pacemaker generator change with      implant of a St. Jude Zephyr XL DR dual-chamber pacemaker.  The      patient has new right atrial and right ventricular leads.  2. Complete heart block.  The patient is pacer dependent.   SECONDARY DIAGNOSES:  1. Progressive dyspnea over the last few weeks.  2. History of transient ischemic attack.  3. Hypertension.  4. Dyslipidemia.  5. Chronic obstructive pulmonary disease.   PROCEDURES:  July 02, 2007, implant of the [place] DR dual-  chamber pacemaker.  The patient had a CPI microlith 0505 pacemaker with  a unipolar passive lead implanted in 1980.   BRIEF HISTORY:  Jessica [name] is a 77 year old female who recently moved  from [place] to the [place] area.  She had a pacemaker implanted in  1980.  She has a diagnosis of complete heart block and is pacer  dependent.  The device has not been checked in more than 10 years and it  has reached end-of-life.   The patient has had progressive shortness of breath over the recent few  weeks in the setting of chronic dyspnea.  She does not have nocturnal  dyspnea or orthopnea.   The patient has had a couple episodes of chest pain that last about an  hour.  Other risk factors are notable for hypertension and dyslipidemia.  The patient was seen by Dr. Caryl Comes on June 8 when she presented to the  office of [facility] in [place].  She was in the midst of a  stress study to assess for the patient's intermittent chest pain.   This  was interrupted, so that she could come to [facility] and have  pacemaker implantation.   HOSPITAL COURSE:  The patient presents on June 10.  She underwent  implantation of a St. Jude Zephyr device after explantation of her CPI  microlith-P 505 single chamber pacemaker.  New right atrial and right  ventricular leads were placed.  Her device operation is DDDR.  The  pacemaker has been interrogated after implantation.  The values are  within normal limits.  Her incision is healing nicely.  There is no  hematoma.  Chest x-ray shows that the leads are in appropriate position  and no pneumothorax.  The patient was kept for 48 hours since she is  pacemaker dependent and intensive care unit will discharge from there on  June 12.   DISCHARGE MEDICATIONS:  She goes home on the following medications.  1. Multivitamin daily.  2. Enteric-coated aspirin 81 mg daily.  3.  Lisinopril/HCT 10/12.5 mg daily.  4. Simvastatin 20 mg daily at bedtime.  5. Singulair 10 mg daily as needed.   FOLLOWUP:  She follows up at [facility], [address] for the pacer clinic to have a repeat stress test and an  echocardiogram on Thursday, June 25 at 8:30.  She is asked to eat  nothing after midnight, Wednesday, June 24.  She sees Dr. Verl Blalock,  Wednesday, July 1 at 10:15.  She will see Dr. Caryl Comes in the office at  [facility] on Monday, September 14 at 9:30.   LABORATORY DATA:  Lab studies pertinent to this admission were drawn on  June 30, 2007.  White cells 6.1, hemoglobin 13, hematocrit 40.2,  platelets are clumping and inadequate count available.  Protime 13.1.  INR is 1.  Sodium 141, potassium 3.8, chloride 105, carbonate 25,  glucose 83, BUN is 23, creatinine 1.18.      Sueanne Margarita, [place]      Deboraha Sprang, MD, [facility]  Electronically Signed    GM/MEDQ  D:  07/04/2007  T:  07/04/2007  Job:  [id number]   cc:   Marijo Conception. Verl Blalock, MD, Sampson Si, M.D.

## 2010-06-06 NOTE — Assessment & Plan Note (Signed)
OFFICE VISIT   LEAANNE, Jessica [name]  DOB:  1929/12/01                                       09/09/2007  [id number]   The patient is almost 4 weeks post stenting and grafting of infrarenal  abdominal aortic aneurysm with insertion of aorto-bi-common-iliac graft.  She has done well from that surgical procedure with slow return of her  appetite.  She continues to have some constipation and will start taking  milk of magnesia on a daily basis to see if this helps.  She is  ambulating without difficulty, swallowing well, and her strength is  improving.  She did have a cecal mass, which we discovered  preoperatively, and Dr. Margot Chimes evaluated this intraoperatively.  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   EXAM:  Blood pressure 130/78, heart rate is 98, respirations 14.  Abdominal incision is well healed.  No evidence of ventral hernia.  She  has excellent femoral and distal pulses bilaterally with no edema.   I think she is progressing nicely from her aneurysm surgery.  She will  be seeing Dr. Margot Chimes next week for further evaluation and be undergoing  a second surgical procedure in the near future.  She will return to see  me in 3 months after Dr. Dickie La procedure unless she has any problems  in the interim.   Nelda Severe Kellie Simmering, M.D.  Electronically Signed   JDL/MEDQ  D:  09/09/2007  T:  09/10/2007  Job:  [id number]   cc:   Deboraha Sprang, MD, [facility]  Dr. Daun Peacock  Haywood Lasso, M.D.

## 2010-06-06 NOTE — Assessment & Plan Note (Signed)
Jessica OFFICE NOTE   [name], Jessica                         MRN:          [id number]  DATE:06/20/2007                            DOB:          1929-02-27    I was asked by Dr. Kimber Relic to consult on Jessica [name] with a history of  complete heart block, status post pacemaker in November of 1980 (has not  had it checked in over 10 years), and an abnormal echocardiogram showing  segmental wall motion abnormality apparently new and diastolic  dysfunction.   She is 77 years of age, widowed, a white female who moved here from  [place].  She has three children.   She is fairly limited with dyspnea on exertion.  She has had a diagnosis  of COPD from history of tobacco use which she quit in 1999.   Recent echocardiogram showed EF of about [id number], diastolic dysfunction  and segmental wall motion abnormality.  It was not made clear from the  office note sent where the wall motion abnormality was.  She also tells  me she has a history of mitral valve prolapse but I do not see that  reported.   She had a pacemaker placed for complete heart block 30 years ago in  [place].  At that time she was told she had a blood clot behind her  heart and she had a catheterization in [place] which she said did not  show any significant coronary disease.  She denies any true angina.  She  has significant dyspnea on exertion.  She denies any orthopnea, PND or  peripheral edema.  She carries a diagnosis of hypertension but rarely  has it checked.   PAST MEDICAL HISTORY:  She is intolerant of CODEINE.  She has no history  of dye reaction.   CURRENT MEDS:  1. Are multivitamin daily.  2. Aspirin 81 mg a day.  3. Lisinopril/HCTZ 10/12.5 daily.  4. Simvastatin 20 mg a day.  5. Singulair 10 mg a day.  6. She takes Asmanex inhaler.  7. Spiriva inhaler.  8. ProAir inhaler p.r.n.   SOCIAL HISTORY:  She quit smoking in 1999.  She does  drink some alcohol  on occasion with some wine.  She is retired since 1989.  She is a widow.  She has 3 children.   PAST SURGICAL HISTORY:  Is negative other than the pacer.   FAMILY HISTORY:  Is really noncontributory.   REVIEW OF SYSTEMS:  Her review of systems other than the HPI is really  negative.   Recent blood work including thyroid panel, CBC, comprehensive metabolic  panel were all within normal limits except for a platelet count of  141,000.  Her total cholesterol was remarkably 177, triglycerides 124,  HDL 54, total cholesterol HDL ratio 3.3, VLDL of 25, LDL of 98 on  simvastatin.   PHYSICAL EXAMINATION:  GENERAL:  She is very pleasant.  She is 5 feet 7  inches and she weighs 147 pounds.  VITAL SIGNS:  Her blood pressure is 152/73.  I rechecked it with  a large  cuff on the left arm and it confirmed this value.  Her pulse is 55 and  she is a regular.  EKG from Dr. Fredrich Romans office shows ventricular  pacing.  I do not really see any true P-waves so she may have underlying  atrial fibrillation.  HEENT:  Normocephalic, atraumatic.  PERRL.  Extraocular movements  intact.  Sclerae clear.  Facial symmetry normal.  Carotid upstrokes were  equal bilaterally with a left carotid bruit.  There is a soft sound on  the right but it sounds like referred murmur.  NECK:  Thyroid is not enlarged.  Trachea is midline.  There is no  lymphadenopathy.  Neck is supple.  LUNGS:  Lungs reveal decreased breath sounds throughout.  HEART:  Her heart exam reveals a nondisplaced PMI.  She has a normal S1  but a paradoxically split S2.  She has no S3 gallop.  There is no right  ventricular lift.  There is a systolic murmur along the left sternal  border.  I could hear no prolapse per se.  ABDOMEN:  Soft, good bowel sounds.  No midline bruit.  No hepatomegaly.  EXTREMITIES:  Reveal no clubbing, cyanosis or edema.  Pulses are intact.  NEUROLOGICAL:  Exam is intact.  SKIN:  Unremarkable.    ASSESSMENT:  1. History of complete heart block status post ventricular pacer in      1980, currently V paced by last EKG with a question of underlying      atrial fibrillation.  It has not been checked in over 10 years.  2. Abnormal 2-D echocardiogram with new segmental wall motion      abnormality.  Whether this is a septal wall motion abnormality from      pacing or whether this is an ischemic or infarct change is yet to      be determined.  3. Diastolic dysfunction.  4. Dyspnea on exertion, rule out obstructive coronary disease with      multiple risk factors.  This may be all chronic obstructive      pulmonary disease.  5. Chronic obstructive pulmonary disease.  6. Hypertension probably not under optimal control.  7. Hyperlipidemia with relatively good numbers, particularly HDL.  8. Asymptomatic left carotid bruit.   PLAN:  1. Carotid Dopplers.  2. Adenosine rest/stress Myoview to rule out obstructive coronary      disease.  3. 2-D echocardiogram to reassess segmental wall motion abnormality in      the setting of pacing.  4. Pacer/EP clinic to check pacer and also to check for underlying      atrial fibrillation.   I have made no change in her medical program.  I will have her come back  after all these studies are done.  We will sit down and discuss at that  time and make recommendation to her and to Dr. Kimber Relic.     Thomas C. Verl Blalock, MD, [facility]  Electronically Signed    TCW/MedQ  DD: 06/20/2007  DT: 06/20/2007  Job #: [id number]   cc:   Kimber Relic, Dr

## 2010-06-06 NOTE — Telephone Encounter (Signed)
Prior Jessica [name] is needed for ventolin, form is on your desk.

## 2010-06-06 NOTE — Assessment & Plan Note (Signed)
OFFICE VISIT   Jessica [name], Jessica [name]  DOB:  12/24/1929                                       08/12/2007  [id number]   The patient returns today because of her upcoming surgery for resection  graft of an infrarenal abdominal aortic aneurysm.  This is 6.7 cm in  maximum diameter and is not a candidate for stent grafting.  We did  perform a CT angiogram earlier today to confirm those findings since she  had not had a complete CT scan.  There is a very short neck which then  angulates directly to the right and the neck is very irregular and  certainly not long enough for a stent graft.  Aneurysm is very  multilobulated in appearance and tapers down in the terminal aorta.  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  I discussed this with Dr. Zigmund Daniel, radiologist, today  and also asked Dr. Cristina Gong opinion and for him to get involved in  the case.  I discussed with the patient at length the fact that she has  2 significant problems which need to be dealt with.  She needs the  aneurysm resected and it is a large aneurysm, and this large cystic mass  of the appendix could be treated possibly at the same time or possibly  in a staged fashion.  This will depend on the findings at the time of  surgery, and Dr. Margot Chimes and I were in agreement with this plan.  We will  proceed with her surgery this Thursday morning, July 23, with  exploratory laparotomy and, depending on the findings will proceed with  either 1 or both procedures.   EXAM:  Her abdomen is soft and nontender, and continues to have a large  pulsatile mass with 3+ femoral pulses palpable.  Blood pressure is  142/94, heart rate is 70, respirations 14.   She does have complete heart block and has a dual chamber St. Jude  Zephyr-XL pacemaker with right atrial and right ventricular leads.  Risks and benefits of the surgery were totally discussed again with the  patient today, and she would like to proceed.   Nelda Severe Kellie Simmering, M.D.  Electronically Signed   JDL/MEDQ  D:  08/12/2007  T:  08/13/2007  Job:  [id number]   cc:   Marijo Conception. Verl Blalock, MD, [facility]  Deboraha Sprang, MD, [facility]  Dr. [name]

## 2010-06-06 NOTE — H&P (Signed)
HISTORY AND PHYSICAL EXAMINATION   July 28, 2007   Re:  [place], [place]                  DOB:  06/13/1929   CHIEF COMPLAINT:  Infrarenal abdominal aortic aneurysm.   HISTORY OF PRESENT ILLNESS:  This 77 year old female with a history of  arrhythmias dating back 29 years recently had a pacemaker generator  change by Dr. Jolyn Nap with insertion of right atrial and right  ventricular leads (St. Jude Zephyr XL dual chamber pacer).  During that  evaluation she underwent a CT scan of the chest or COPD was found to  have infrarenal 6.7 cm abdominal aortic aneurysm, which was not  completely evaluated by his scan.  She has small pulmonary nodules  detected which will be followed later and she was referred for further  evaluation for her aortic aneurysm.  A CT scan revealed that the neck  was too short to allow for aortic stent grafting and because of this  large size and risk of rupture she was scheduled for open resection of  this abdominal aortic aneurysm.   PAST MEDICAL HISTORY:  1. Hypertension.  2. Hyperlipidemia.  3. COPD.  4. History of complete heart block, pacemaker dependent.  5. Questionable mini-strokes many years ago.  No active symptoms.   PAST SURGERIES:  1. Insertion of pacemaker - dual chamber.  2. Right breast biopsy 50 years ago.   FAMILY HISTORY:  Positive for congestive heart failure.  Father died of  this many years ago.  Negative for diabetes and stroke.   SOCIAL HISTORY:  She is widowed and is retired.  She smoked 1 to 2 packs  of cigarettes for 50 years.  Has not smoked in the past 10 years.  Does  not use alcohol.   REVIEW OF SYSTEMS:  Negative for chest pain, although she does have  dyspnea on exertion because of her COPD.  No orthopnea or dyspnea at  rest.  Has a history of a heart murmur.  No hemoptysis, chronic cough,  bronchitis.  Negative for GI and GU symptoms.  Does not have  claudication.  Does not ambulate long distances.   ALLERGIES:  Codeine.   MEDICATIONS:  1. Aspirin 81 mg a day.  2. Lisinopril and hydrochlorothiazide 10/12.5 one daily.  3. Simvastatin 20 mg 1 daily.  4. Singulair 10 mg 1 daily.   PHYSICAL EXAM:  Blood pressure is 150/82, heart rate 76, respirations  are 14.  General:  She is a female patient in no apparent distress,  alert and oriented x3.  Neck is supple 3+ carotid pulses palpable.  Heart murmur radiates into both sides of the neck.  No palpable  adenopathy in the neck.  Neurologic:  Normal.  Chest:  Clear to  auscultation.  There is a pacemaker generator palpable in the left  infraclavicular area.  Upper extremity pulses 3+ bilaterally.  Abdomen  is soft, nontender with a 7 cm pulsatile mass.  She has 3+ femoral  popliteal, dorsalis pedis pulses palpable bilaterally.   IMPRESSION:  1. A 6.7 cm infrarenal abdominal aortic aneurysm.  2. Hypertension.  3. Hyperlipidemia.  4. History of complete heart block, pacemaker dependent.  5. Chronic obstructive pulmonary disease.   PLAN:  To admit the patient for an elective resection and grafting of  infrarenal abdominal aortic aneurysm on July 23rd.  Risks and benefits  have been thoroughly discussed with the patient and her daughter, and  she would  like to proceed.   Nelda Severe Kellie Simmering, M.D.  Electronically Signed   JDL/MEDQ  D:  07/28/2007  T:  07/29/2007  Job:  [id number]   cc:   Thomas C. Verl Blalock, MD, [facility]  Deboraha Sprang, MD, [facility]  Dr. Kimber Relic

## 2010-06-06 NOTE — Discharge Summary (Signed)
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  2. Chronic anemia.  3. Postoperative hypotension likely secondary to volume depletion.   CLINICAL HISTORY:  Ms. Souffront is a 77 year old lady who was recently  operated on for AAA.  As part of that workup, she was incidentally found  to have a large what appeared to be a mucinous mass either arising in  the appendix or adjacent to it and up against the cecum.  After she  recovered from that surgery, she was now admitted for elective right  colectomy.  In the meantime, she had a colonoscopy which had been  negative except for a few benign colon polyps.  The tumor itself was  asymptomatic.   HOSPITAL COURSE:  The patient was admitted and taken to the operating  room where she underwent a right colectomy without incident.  It did  appear to be about a 7-cm mucinous tumor.  This seemed to arise in the  appendix grossly.   Postoperatively, she had hypotension the overnight and got some volume  depletion as manifest by an elevation in her creatinine.  She did  respond to being off her blood pressure medications and increased  intravenous fluids.  Since her hemoglobin drifted a little bit down, I  got a CT scan which showed no evidence of intra-abdominal bleeding.  In  addition, Cardiology consult was obtained to be sure that no other  incident had occurred.   Following that, she became normotensive.  She was able to begin liquids  and advanced to solids.  By October 27, 2007, she was anxious to go home.  She had been having bowel movements, taking nothing for pain,  and  ambulating.  Her blood pressure had been stable and Cardiology had  restarted her blood pressure medications.  Her abdomen was benign, and  her wound was healing nicely.   DATA REVIEWED:  Her troponin was 0.2.  Electrolytes were basically  normal with exception of postoperative creatinine going up to 2.38, and  then coming back down towards normal.  Hemoglobin had been 12.4 and  drifted down to about 9.3 by discharge.  Pathology reports does not have  a written copy in the chart but a review of it  showed that it was a low-grade mucinous tumor with negative lymph nodes.  The patient was discharged in satisfactory condition, will resume usual  home medications, and to follow up in my office in approximately 1 week  for staple removal.  She felt that she did not need anything for pain  medication.      Haywood Lasso, M.D.  Electronically Signed     CJS/MEDQ  D:  10/27/2007  T:  10/28/2007  Job:  [id number]   cc:   Thomas C. Verl Blalock, MD, [facility]  Lowella Bandy. Olevia Perches, MD

## 2010-06-06 NOTE — Op Note (Signed)
NAMEAIYONA, MORRA                ACCOUNT NO.:  [id number]   MEDICAL RECORD NO.:  [id number]          PATIENT TYPE:  INP   LOCATION:  5123                         FACILITY:  [facility]   PHYSICIAN:  Haywood Lasso, M.D.DATE OF BIRTH:  1929/08/19   DATE OF PROCEDURE:  10/22/2007  DATE OF DISCHARGE:                               OPERATIVE REPORT   OFFICE MEDICAL RECORD NUMBER [id number] [phone].   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   OPERATION:  Right hemicolectomy.   SURGEON:  Haywood Lasso, MD   ASSISTANT:  Dickey Gave, MD   ANESTHESIA:  General.   CLINICAL HISTORY:  This is a 77 year old lady who was being worked up  for AAA.  She was incidentally found to have a 7-cm mucinous-adhering  tumor that was either arising in the appendix or cecum.  At the time of  her AAA repair, I evaluated her intraoperatively.  I felt that she was  going to need a right colectomy for resection of this.  It was not  immediately clear whether this was actually in the cecum or descending  colon.   Following her recovery from her AAA surgery, she underwent colonoscopy  which was basically negative.  She then was admitted today for right  colectomy.   DESCRIPTION OF PROCEDURE:  The patient was seen in the holding area and  she had no further questions.  We confirmed the right colectomy was the  planned procedure.   The patient was taken to the operating room after satisfactory general  endotracheal anesthesia had been obtained and Foley catheter was placed  and the abdomen was prepped and draped.  The time-out was done.   I reopened the midline incision using about two-thirds of its length.  The Prolene sutures were cut and removed for the length of the incision  and the fascia was entered and the peritoneal cavity was entered.  There  was some  omental adhesions to the midline which were taken down bluntly  and one loop of small bowel stuck to the left lower midline which was  taken down sharply.  A little deserosalized area was repaired with some  3-0 Vicryl.  Except for this, there were no significant abdominal  adhesions.   General abdominal exploration showed no gross abnormalities with the  exception of a large mass of the parent cecum in the appendix area.  It  appeared to be really between the two and was difficult to tell at this  point of origin.   I mobilized the right colon incising the white line of Toldt and  reflecting the colon medially.  I took care to identify the ureter and  make sure it was well lateral.  I took down the hepatic flexure using  either cautery or LigaSure.   I had right colon completely mobilizing up into the wound.  I selected a  point of the terminal ileum to divide the small bowel and one in the  right colon to divide the colon.  I then made small windows in the  mesentery and divided the intervening mesentery with the LigaSure with  the exception of large vessels were double tied with 2-0 silk.   Once I had this all freed up and the mesentery divided, I tacked the  antimesenteric border of the transverse colon to the antimesenteric  border of the terminal ileum.  Both were opened in the area to be  resected and a GIA was inserted and fired.  The staple line appeared  intact with no bleeding.   Using a TA-60, I came across all 4 walls to close the common defect and  fired that.  These specimens were handed off the table.  After it was  handed off, the tumor ruptured and it was clearly a mucinous tumor of  some sort.  However, this occurred on the back table and there was no  contamination of any operative instruments.   At this point, I close the mesentery with some 3-0 silks.  We changed  instrument count and gloves and then irrigated.  Once everything was  dry, we checked again the  terminal ileum in the anastomotic area as well  as a small area with deep exploration to make sure everything looked  okay.  I then closed the abdomen with a running #1 Prolene on the  fascia.  The wound was irrigated and skin was closed with staples.   The patient tolerated the procedure well and there were no operative  complications.  All counts were correct.      Haywood Lasso, M.D.  Electronically Signed     CJS/MEDQ  D:  10/22/2007  T:  10/23/2007  Job:  [id number]   cc:   Thomas C. Verl Blalock, MD, [facility]  Nelda Severe. Kellie Simmering, M.D.  Lowella Bandy. Olevia Perches, MD

## 2010-06-06 NOTE — Letter (Signed)
November 24, 2007    [name], MD  [address], [place]   RE:  NYONNA, STANFILL  MRN:  [id number]  /  DOB:  Jun 18, 1929   Dear Nori Riis,   Ms. Merkel is seen in followup for pacemaker that was revised in June  2009.  She is doing really quite well at this point.   Her history following that, however, was really quite complicated in  that lung nodules were seen.  CT was done, demonstrated an aortic  aneurysm, which was resected on further evaluation, after which  demonstrated mucinous tumor of her appendix, which was subsequently  resected.  She is finally getting back to herself and her pacemaker is  not an issue.   Her medications today include paroxetine, lisinopril, simvastatin, and  aspirin.   PHYSICAL EXAMINATION:  VITAL SIGNS:  Her blood pressure is 146/84, the  pulse is 84, her weight is 125, which is down 25 pounds since this  summer.  LUNGS:  Clear.  HEART:  Sounds are regular.  The pacemaker pocket is well healed.  EXTREMITIES:  Without edema.   Interrogation of her St. Jude Zephyr pulse generator demonstrates a P-  wave of 2.2 with impedance of 379, a threshold of 1 volt at 0.5, the R-  wave was 4.2 with impedance of 560, the threshold of 0.65 at 0.5.  Battery voltage was 2.77.  She has complete heart block with an  underlying rhythm at 40 beats per minute.   The device was reprogrammed.   IMPRESSION:  1. Complete heart block.  2. Status post pacemaker for the above.  3. Pulmonary nodules, in need of followup.  4. Status post abdominal aortic aneurysm resection.   Dr. Kimber Relic, I have taken the liberty of ordering a followup CT scan, the  report of which we will send to you.  We will need to make sure we touch  base directly as we have discussed the followup of that.  We will see  her again in June for a pacemaker.    Sincerely,      Deboraha Sprang, MD, [facility]  Electronically Signed    SCK/MedQ  DD:  11/24/2007  DT: 11/25/2007  Job #: [id number]   CC:    Thomas C. Wall, MD, [facility]

## 2010-06-06 NOTE — Procedures (Signed)
BYPASS GRAFT EVALUATION   INDICATION:  Follow up abdominal aortic aneurysm repair.   HISTORY:  Diabetes:  No.  Cardiac:  Pacemaker.  Hypertension:  Yes.  Smoking:  Previous.  Previous Surgery:  Aortobi-common iliac artery bypass graft repair of  abdominal aortic aneurysm on 08/14/07.   SINGLE LEVEL ARTERIAL EXAM                               RIGHT              LEFT  Brachial:                    172                172  Anterior tibial:             182                175  Posterior tibial:            183                170  Peroneal:  Ankle/brachial index:        1.06               1.02   PREVIOUS ABI:  Date: 09/09/07  RIGHT:  >1.0  LEFT:  >1.0   LOWER EXTREMITY BYPASS GRAFT DUPLEX EXAM:   DUPLEX:  Biphasic Doppler waveforms noted throughout the aortic bypass  graft and its native vessels with no focal increase in Doppler  velocities noted.   IMPRESSION:  1. Patent aortobi-common iliac bypass graft with no evidence of      stenosis.  2. Stable bilateral ankle brachial indices noted.       ___________________________________________  Nelda Severe Kellie Simmering, M.D.   CH/MEDQ  D:  03/09/2008  T:  03/09/2008  Job:  [id number]

## 2010-06-06 NOTE — Procedures (Signed)
AORTA-ILIAC DUPLEX EVALUATION   INDICATION:  Followup evaluation of abdominal aortic aneurysm repair.   HISTORY:  Diabetes:  no  Cardiac:  pacemaker  Hypertension:  yes  Smoking:  previous  Previous Surgery:  Aorto-bi- common iliac bypass graft repair on  08/14/2007               SINGLE LEVEL ARTERIAL EXAM                              RIGHT                  LEFT  Brachial:                  182                    170  Anterior tibial:           186                    183  Posterior tibial:          188                    194  Peroneal:  Ankle/brachial index:      1.03                   1.07  Previous ABI/date: 03/09/2008                     1.06  1.02   AORTA-ILIAC DUPLEX EXAM  Aorta - Proximal     52 cm/s  Aorta - Mid          107 cm/s  Aorta - Distal       43 cm/s   RIGHT                                   LEFT  102 cm/s          CIA-PROXIMAL          83 cm/s  145 cm/s          CIA-DISTAL            99 cm/s  cm/s              HYPOGASTRIC             cm/s    cm/s            EIA-PROXIMAL            cm/s    cm/s            EIA-MID                 cm/s    cm/s            EIA-DISTAL              cm/s   IMPRESSION:  Patent aorto-bi-common iliac bypass graft with no evidence  of focal stenosis.  Normal bilateral lower extremity ankle brachial index with triphasic and  biphasic Doppler wave forms.   ___________________________________________  Nelda Severe Kellie Simmering, M.D.   AC/MEDQ  D:  09/21/2008  T:  09/21/2008  Job:  [id number]

## 2010-06-06 NOTE — Procedures (Signed)
LOWER EXTREMITY ARTERIAL EVALUATION-SINGLE LEVEL   INDICATION:  Follow-up evaluation of AAA repair.   HISTORY:  Diabetes:  No.  Cardiac:  Pacemaker.  Hypertension:  Yes.  Smoking:  Quit.  Previous Surgery:  AAA repair on 08/14/07 by Dr. Kellie Simmering.   RESTING SYSTOLIC PRESSURES: (ABI)                          RIGHT                LEFT  Brachial:               150                  140  Anterior tibial:        158                  160  Posterior tibial:       164 (>1.0)           160 (>1.0)  Peroneal:  DOPPLER WAVEFORM ANALYSIS:  Anterior tibial:        Triphasic            Triphasic  Posterior tibial:       Triphasic            Triphasic  Peroneal:   PREVIOUS ABI'S:  Date:  RIGHT:  LEFT:   IMPRESSION:  Normal ankle brachial indices bilaterally, status post  abdominal aortic aneurysm repair.   ___________________________________________  Nelda Severe. Kellie Simmering, M.D.   PB/MEDQ  D:  09/09/2007  T:  09/09/2007  Job:  [id number]

## 2010-06-06 NOTE — Assessment & Plan Note (Signed)
[facility] OFFICE NOTE   Jessica [name], Jessica [name]                         MRN:          [id number]  DATE:07/23/2007                            DOB:          1929/10/13    Jessica [name] returns today for followup.  Please see my initial note on Jun 20, 2007.  She subsequently was seen by Dr. Jolyn [name] at [facility] and subsequently had pacemaker generator changed  with a Baraga DR dual-chamber pacer.  She also had a right  atrial and right ventricular lead to replace.   INDICATIONS:  Complete heart block and she is totally pacemaker  dependent.   Because of her multiple cardiovascular risk factors not to mention  dyspnea on exertion, we obtained carotid Dopplers which showed  nonobstructive plaque with antegrade flow in both vertebrals.  Her  adenosine rest/stress Myoview showed no ischemia.  EF was 65%.  A 2D  echocardiogram showed normal left ventricular function, mild aortic  valve thickening, mitral annular calcification, EF 55%-60%, no prolapse,  trivial pericardial effusion, increased thickness in the anterior  pericardium with a catheter wire at the right ventricle.  She may have  some residual pericarditis or chronic pericardial changes from previous  pacemaker insertion.  This is not clinically significant, however.  We  also obtained a CT scan of the chest with her COPD history.  This showed  a 7-mm nodule or nodules and central lobar emphysema.  The nodules need  followup in 3 months per the radiologist's review.  We will arrange for  this.  In addition, we picked up a 4.7 x 6.7 infrarenal abdominal aortic  aneurysm that was thrombosed partially.  This is totally asymptomatic.  She will refer to the vascular surgery in [facility] for this.  I have  explained this to her family and her in detail today.   Her medications are unchanged since last visit with a multivitamin,  aspirin 81 mg a day, lisinopril and hydrochlorothiazide 10/12.5 daily,  simvastatin 20 mg, and Singulair 10 mg a day.   PHYSICAL EXAMINATION:  Blood pressure is excellent at 120/84 and pulse  today was 70 and regular.   PLAN:  1. Refer to vascular surgery for abdominal aortic aneurysm repair.  2. Continue aspirin, simvastatin, and lisinopril and      hydrochlorothiazide.  3. Follow up with Dr. Caryl Comes at [place] for      pacemaker followup.  4. I will plan on seeing her back in 3 months at which time we will      schedule her CT of the chest to follow up on her pulmonary nodules.     Thomas C. Verl Blalock, MD, [facility]  Electronically Signed   TCW/MedQ  DD: 07/23/2007  DT: 07/24/2007  Job #: [id number]   cc:   Dr. Kimber Relic

## 2010-06-06 NOTE — H&P (Signed)
HISTORY AND PHYSICAL EXAMINATION   August 12, 2007   Re:  [place], [place] S                DOB:  06/10/1929   ADDENDUM:   DATE OF ADMISSION:  July 23rd.   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  Dr. Osborn Coho was consulted and he will be present at the time of  surgery to determine whether one or both of these problems will be dealt  with at that time.  The patient is aware of this and in total agreement  with the plan.   Nelda Severe Kellie Simmering, M.D.  Electronically Signed   JDL/MEDQ  D:  08/12/2007  T:  08/13/2007  Job:  [id number]

## 2010-06-06 NOTE — Assessment & Plan Note (Signed)
[facility] OFFICE NOTE   Jessica, [name]                       MRN:          [id number]  DATE:03/16/2008                            DOB:          [birth year]    Jessica [name] comes in today after being discharged from the hospital.   She is 77 years of age and has a history of peripheral vascular disease  status post abdominal aortic aneurysm repair, although has a history of  complete heart block status post pacemaker implant.   She presented with prolonged left shoulder and arm pain associated with  weakness and sweatiness.   EKG showed a paced rhythm.  Enzymes were negative.  She had a stress  Myoview, which did not show any significant ischemia.  In addition, she  had a positive D-dimer.  CT scan did not show any pulmonary embolus.   She remains on multivitamin daily, aspirin 81 mg a day,  lisinopril/hydrochlorothiazide 10/12.5 daily, simvastatin 20 mg a day,  paroxetine 10 mg per day.   She has had no further discomfort since discharge.  Her son Jessica [name] comes  with her today.   She had a negative stress test with [place] as well when we first met her  dated July 17, 2007.  Her EF was normal at 65%, no ischemia.   PHYSICAL EXAMINATION:  GENERAL:  Today, she is in no acute distress.  VITAL SIGNS:  Her blood pressure is 130/80.  She is a nonsmoker.  Heart  rate is 85, weight is 126.  Rest of her exam is unchanged.   I spent 20 minutes talking to her and her son, Jessica [name].  I do not think  she had ischemic discomfort, though it was a little bit worrisome on  admission.  I have asked her to continue to stay active.  If she has any  symptoms of angina, which I have gone over in detail she knows how to  respond.  She wakes up with any symptoms or has discomfort at rest.  She  also knows how to and has been instructed to call 911.  I suspect she is  okay.   I will see her back in 6 months.     Thomas C. Verl Blalock,  MD, [facility]  Electronically Signed    TCW/MedQ  DD: 03/16/2008  DT: 03/17/2008  Job #: [id number]

## 2010-06-06 NOTE — Op Note (Signed)
NAMESHYTERIA, UDE                ACCOUNT NO.:  [id number]   MEDICAL RECORD NO.:  [id number]          PATIENT TYPE:  INP   LOCATION:  2025                         FACILITY:  [facility]   PHYSICIAN:  Deboraha Sprang, MD, FACCDATE OF BIRTH:  June 28, 1929   DATE OF PROCEDURE:  10/02/2007  DATE OF DISCHARGE:  08/20/2007                               OPERATIVE REPORT   PREOPERATIVE DIAGNOSIS:  Previously implanted pacemaker with upgrade to  a new device.   PROCEDURE:  After obtaining informed consent, the patient was brought to  the Electrophysiology Laboratory and placed on the fluoroscopic table in  supine position.  After routine prep and drape of the left upper chest,  lidocaine was infiltrated in the prepectoral subclavicular region.  Incision was made and carried down to the layer of device pocket using  sharp dissection and electrocautery.  At this point, attention was  turned to gain access to the left subclavian vein which was accomplished  without difficulty with 2 separate venipunctures.  Sequentially, 7-  French sheaths were placed through which were passed a ventricular lead  serial [id number] which was a 1688 lead and the Medtronic 5076 leads  that were [id number].  Under fluoroscopic guidance, these were  manipulated in the right ventricle and the right atrium respectively  where the bipolar R-wave was 10 with a pace impedance of 1.1 volts, pace  impedance of 727 ohms and threshold 1.2 MA.  The current of injury was  brisk.  Note that, this ventricular rhythm was paced.   The bipolar P-wave was 1.6 with pace impedance of 1 volt at 0.5  milliseconds, impedance was 1140 ohms.  The current of threshold was 1.2  MA.  The current of injury was brisk.  These leads were secured to the  prepectoral fascia and the previously implanted lead was explanted.  The  old lead was capped.  A St. Jude Zephyr 5826 pulse generator with serial  [id number] was attached to the new lead.  The pocket was  copiously  irrigated with antibiotic containing saline solution.  Hemostasis was  assured.  The leads and pulse generator were then placed in the pocket  and secured to the prepectoral fascia.  Then the wound was closed in 3  layers in normal fashion.  The wound was washed, dried, and a benzoin  and Steri-Strip dressing was applied.  Needle counts, sponge counts, and  instrument counts were correct at the end of the procedure according to  staff.  The patient tolerated the procedure without apparent  complication.      Deboraha Sprang, MD, [facility]  Electronically Signed     SCK/MEDQ  D:  10/02/2007  T:  10/02/2007  Job:  [id number]

## 2010-06-06 NOTE — Consult Note (Signed)
[name], [name]                ACCOUNT NO.:  [id number]   MEDICAL RECORD NO.:  [id number]          PATIENT TYPE:  INP   LOCATION:  5123                         FACILITY:  [facility]   PHYSICIAN:  Wallis Bamberg. Johnsie Cancel, MD, FACCDATE OF BIRTH:  1929-01-27   DATE OF CONSULTATION:  DATE OF DISCHARGE:                                 CONSULTATION   Jessica [name] is a 77 year old patient we are asked to see for  hypotension postoperatively.   The patient is seeing Dr. Verl Blalock since May 2009.  She is from the  [place] area.  He did a preop clearance on her prior to AAA surgery.  The patient was operated on in July 2009 by Dr. Kellie Simmering for a 6-cm AAA.  This was uneventful.   She has no documented history of coronary artery disease.  She  subsequently was found to have a large cystic mass from the appendix to  her right colon area.   She had this successfully operated on, on October 22, 2007, by Dr.  Margot Chimes.  The operation was uncomplicated.  In the periop course, she has  been somewhat hypotensive with pressures in the [id number] systolic range.  Her primary complaint is abdominal pain.   She feels that she is a little confused as well.  She is not having any  significant chest pain.  Prior to her surgery, she had a normal Myoview  per Dr. Winnifred Friar records.  She has had normal LV function by echo.  Her  cardiac enzymes were negative.  She has a longstanding history of being  pacemaker dependent.  Her initial pacemaker was placed in 1980.  It was  revised by Dr. Caryl Comes this year and is functioning normally.   REVIEW OF SYSTEMS:  Remarkable for some nausea.  She has poor p.o.  intake, otherwise negative.   Her current medications include cefoxitin, hydrochlorothiazide, Paxil,  Zocor, Spiriva, albuterol, and a PCA pump.   She is allergic to CODEINE and MORPHINE.   The patient is widowed.  She is really from [place].  She is a previous  smoker with significant COPD.  She has three daughters, one of  whom I  spoke with in the room.  She is otherwise fairly sedentary.   She does not drink.   Her family history is noncontributory.   PHYSICAL EXAMINATION:  GENERAL:  Remarkable for somewhat pale elderly  white female, in some pain from her abdomen.  VITAL SIGNS:  Blood pressure is currently 95 over palp.  She is being  ventricular paced at a rate of 80.  Respirations are 14.  She is  afebrile.  Sats are 96% on 2 liters.  HEENT:  Unremarkable.  NECK:  Carotids are normal without bruits.  No lymphadenopathy,  thyromegaly, or JVP elevation.  LUNGS:  Clear.  Good diaphragmatic motion.  No wheezing.  HEART:  S1 and S2 with normal heart sounds.  PMI normal.  ABDOMEN:  A dressing in place.  Bowel sounds are sparse.  There is some  tenderness, but no rebound and the abdomen is not distended.  She has an  aortobifemoral graft in.  She has good pedal pulses.  No lower extremity  edema.  NEURO:  Nonfocal.  SKIN:  Warm and dry.  No muscular weakness.   EKG is paced.  Cardiac enzymes are negative.  She appears prerenal with  a BUN of 27 and a creatinine of 2.3, potassium is 4.4.  CBC shows anemia  with a hematocrit of 29.8.   Abdominal CT scan shows a minimal amount of blood in the pelvis, good  colonic anastomosis, no free air or acute abnormalities.   IMPRESSION:  1. Relative hypotension in the postop period, likely related to poor      p.o. intake, angiotensin converting enzyme inhibitor, diuretic.  2. Anemia.  3. Excess Dilaudid PCA pump use.   There is no evidence for any acute cardiac problem, although her EKG is  nondiagnostic due to pacing.  Her enzymes are negative.  She is not  having chest pain.  She had a normal Myoview prior to her abdominal  aortic aneurysm surgery.   We will stop her angiotensin-converting enzyme inhibitor and HCTZ.  She  will be hydrated.  I will leave it up to Dr. Margot Chimes as to whether or not  he wants to transfuse 1 unit of blood, which I think would  be helpful.  She will have normal saline at 100 mL an hour infused.  We will check a  2-D echocardiogram to further assess the left ventricular and right  ventricular function particularly since we have a baseline.   Continued postop therapy in regards to pain control, nutrition,  hydration and ruling out source of infection to be left to the surgical  service.   We would be happy to follow the patient along.  I spoke in detail with  the daughter regarding these issues.   Chronic obstructive pulmonary disease:  She has significant COPD and is  a bronchitic-sounding female.  Her sats seemed to be okay on 2 liters.  There is no chest x-ray recorded from this admission.  Her chest x-ray  from August 15, 2007, showed some left basilar atelectasis.  Followup  chest x-ray is probably indicated this hospitalization.      Wallis Bamberg. Johnsie Cancel, MD, [facility]  Electronically Signed     PCN/MEDQ  D:  10/23/2007  T:  10/24/2007  Job:  [id number]

## 2010-06-06 NOTE — Letter (Signed)
June 30, 2007    Jessica Conception. [name], [place], [place] [address]  [place]  [place], [place] [address]   RE:  Jessica, [name]  MRN:  [id number]  /  DOB:  Aug 19, 1929   Dear Jessica [name],   It was a pleasure to see Jessica [name] at your request regarding her  pacemaker.   As you know, she is a 77 year old woman recently moved up here from  [place] who had a pacemaker implanted in 1980.  As you noted in her  note, it has not been checked in more than 10 years and it turns out to  be a CPI Microlith 0505 pacemaker with a unipolar passive lead.  It has  reached end of life.   She has had progressive shortness of breath over the recent weeks in the  setting of chronic shortness of breath x5 years.  She does not have  nocturnal dyspnea or orthopnea.   She has had a couple of episodes of chest pain, had some earlier today,  and had some a couple of weeks ago that lasted about an hour.  Her  cardiac risk factors are notable for hypertension and  hypercholesterolemia.   There was a comment somewhere that I thought I saw about atrial  fibrillation, but I cannot see that as I looked through the chart again.  Her thromboembolic risk factors are notable for prior TIA, hypertension,  age, and congestive heart failure.   Her past medical history is notable primarily as above.   She has remote history of catheterization, which is apparently  nonrevealing.   Her past review of systems, in addition to above, is notable for  dyslipidemia and COPD.  It is otherwise probably negative.  She does  snore, but she does not have daytime somnolence.   SOCIAL HISTORY:  She is widowed.  She had 3 children, 2 surviving, her  youngest son died in action as a Sheriff in [place].  She drinks  alcohol occasionally.  She does not use cigarettes or recreational  drugs.   Her past surgical history is negative.   Her family history is noncontributory.   On examination, she is an elderly Caucasian female appearing  in her  stated age of 33.  Her blood pressure was 154/80 with a pulse of 55.  Her weight was 150, which is stable.  Her HEENT exam demonstrated no  icterus or xanthoma.  The neck veins were flat.  The carotids were brisk  and full bilaterally with a left-sided bruit versus radiation of murmur  from the right upper sternal border.  S1 was variable and S2 was fixed.  The abdomen was soft with active bowel sounds without midline pulsation  or hepatomegaly.  Femoral pulses were 2+.  Distal pulses were intact.  There is no clubbing, cyanosis, or edema.  Neurological exam was grossly  normal.  Her skin was warm and dry.   Electrocardiogram dated today demonstrated a V-paced rhythm in the  setting of underlying sinus rhythm at 75 beats per minute.  Her  pacemaker is a CPI Microlith with unipolar lead as noted above.   IMPRESSION:  1. Complete heart block.  2. Underlying sinus rhythm.  3. Status post pacer from complete heart block implanted in 1980, now      at EOL (and far past).  4. Exercise intolerance likely related to status post pacer.  5. Thromboembolic risk factors notable for:      a.  Prior stroke.      b.     Hypertension.      c.     Congestive heart failure.      d.     Age.  6. Atypical chest pain with a remote negative catheterization and an      ongoing stress scan intercurrently.   PROBLEMS:  Jessica [name] has complete heart block with a previously  implanted single-chamber pacemaker in the setting of sinus rhythm that  is now EOL.  She has had progressive exercise intolerance and her heart  rate is slowing down.  Her heart rate a couple weeks ago was 55 as it is  today.  It is hard to predict, however, the longevity of this device and  its demise could be Administrator, arts.  Because of that, we have discussed urgent  pacemaker revision.  My inclination is to put in a dual-chamber device  with a new ventricular lead.  In the event that the left vein is open  and I am not sure  about this as she does have some superficial veins, we  would implant through the left side; if the left side were occluded, we  would implant through the right side and then removed the left-sided  pulse generator.   I have reviewed with her the potential benefits as well as potential  risks including infection.  She understands these risks and is willing  to proceed.   Given the unstable nature of her pacer situation, I have asked that we  delay her stress portion of her stress scan and stress portion of her  Myoview and we can do this at the time she comes back for her device  check.   Thank you much for the consultation.     Sincerely,      Deboraha Sprang, MD, [facility]  Electronically Signed    SCK/MedQ  DD: 06/30/2007  DT: 07/01/2007  Job #: [id number]

## 2010-06-06 NOTE — Op Note (Signed)
Jessica [name], Jessica [name]                ACCOUNT NO.:  [id number]   MEDICAL RECORD NO.:  [id number]          [name] TYPE:  INP   LOCATION:  2312                         FACILITY:  [facility]   PHYSICIAN:  Jessica Severe. Kellie [name], M.D.  DATE OF BIRTH:  [birth year]   DATE OF PROCEDURE:  08/14/2007  DATE OF DISCHARGE:                               OPERATIVE REPORT   PREOPERATIVE DIAGNOSES:  1. Large infrarenal abdominal aortic aneurysm.  2. Mass in the right colon (cecum).   OPERATIONS:  1. Exploratory laparotomy with Dr. Kellie [name] and Dr. Margot Chimes.  2. Resection and grafting of infrarenal abdominal aortic aneurysm with      insertion of aorto-bi-common iliac graft using a 14 x 8 mm      Hemashield Dacron graft.   SURGEON:  Lennice Dubose. Kellie Simmering, MD.   FIRST ASSISTANT:  Chad Cordial, PA, for the aneurysm resection and  exploratory lap by Dr. Margot Chimes and Dr. Kellie [name].   ANESTHESIA:  General endotracheal.   ESTIMATED BLOOD LOSS:  300 mL.   COMPLICATIONS:  None.   BRIEF HISTORY:  Jessica [name] was found to have a 6.7-mm infrarenal  abdominal aortic aneurysm, which extended up to the base of the right  renal artery.  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  Dr. Margot Chimes and Dr. Kellie [name] performed an exploratory  laparotomy in order to make a decision as to which procedure should be  performed first for Jessica [name].   PROCEDURE:  The [name] was taken to the operating room and placed in  the supine position at which time satisfactory general endotracheal  anesthesia was administered.  Swan-Ganz catheter and radial arterial  line were inserted by Anesthesia.  Abdomen and groins were prepped with  Betadine scrubbing solution and draped in the routine sterile manner.  A  midline incision was made from the xiphoid to pubis and carried down  through the subcutaneous tissue and linea alba using the Bovie.  Peritoneal cavity was entered and thoroughly explored.  Stomach,  duodenum, small bowel, liver, and gallbladder were unremarkable; all  appearing normal.  The majority of the colon appeared normal, but there  was a large (7-8 cm) cystic mass, which involved the cecum near the  appendix.  There was no adenopathy palpable and no evidence of any  peritoneal seeding or evidence of malignancy.  Dr. Margot Chimes and Dr. Kellie [name]  concurred that the best plan would be to proceed with resection and  grafting of the aneurysm because of its size and have the [name] return  in 6-8 weeks for an elective partial right colon resection for the  secondary procedure.  Therefore, retroperitoneum was exposed.  The  aneurysm was dissected free from the renal arteries to the bifurcation.  Both common iliac arteries were relatively soft and normal in  appearance.  The aneurysm extended up to the base of the right renal  artery.  Both renal arteries were dissected free.  Mannitol 25 grams was  given intravenously, and the  [name] was heparinized.  Aorta was  occluded, just distal to the left renal artery and just proximal to the  right renal artery.  Aneurysm was opened anteriorly and a large amount  of laminated thrombus removed with an unusual appearance with multiple  areas of ulceration both medially and laterally and posteriorly, all  filled with laminated clot, all of which appeared to be penetrating  ulcers of Jessica aneurysm.  Few lumbars were oversewn with 2-0 silk figure-  of-eight sutures.  The neck of the aneurysm was transected about a  centimeter from the right renal artery well below the left renal artery.  A 14 x 8 mm Hemashield Dacron graft anastomosed end-to-end to the aortic  stump using continuous 3-0 Prolene buttressing Jessica with a strip of  felt.  Jessica was checked for leaks; none were present.  Following Jessica,  end-to-end anastomoses were done to the common iliac arteries  bilaterally after  spatulating the graft and the artery, and Jessica was  done with 5-0 Prolene.  Left leg was opened initially followed by the  right leg with no significant hypotension.  Protamine was given to  reverse the heparin following adequate hemostasis.  The retroperitoneum  was irrigated.  Aneurysm sac was closed over the graft with 3-0 Vicryl.  Retroperitoneum was approximated with 3-0 Vicryl.  Peritoneal cavity was  thoroughly irrigated with saline.  The right cecal mass was again  examined and there was no evidence of any perforation or leaking of the  fluid and the linea alba was then closed with #1 Prolene, skin with  clips.  Sterile dressing was applied.  The [name] was taken to the  recovery room in satisfactory condition.      Jessica [name], M.D.  Electronically Signed     JDL/MEDQ  D:  08/14/2007  T:  08/15/2007  Job:  [id number]

## 2010-06-06 NOTE — Discharge Summary (Signed)
Jessica [name], [name]                ACCOUNT NO.:  [id number]   MEDICAL RECORD NO.:  [id number]          PATIENT TYPE:  INP   LOCATION:  2025                         FACILITY:  [facility]   PHYSICIAN:  Jessica Severe. Kellie [name], M.D.  DATE OF BIRTH:  [birth year]   DATE OF ADMISSION:  08/14/2007  DATE OF DISCHARGE:  08/20/2007                               DISCHARGE SUMMARY   ANTICIPATED DATE OF DISCHARGE:  August 20, 2007   ADMISSION DIAGNOSES:  1. Abdominal aortic aneurysm, 6.7 cm.  2. Cecal mass.   DISCHARGE/SECONDARY DIAGNOSES:  1. Abdominal aortic aneurysm, status post open repair.  2. Cecal mass, plans for resection in 3-4 weeks per Dr. Margot Chimes.  3. Hyperlipidemia.  4. Hypertension.  5. Complete heart block with history of pacemaker.  6. Chronic obstructive pulmonary disease.  7. History of right breast biopsy approximately 50 years ago.  8. Questionable history of prior transient ischemic attack.  9. History of tobacco use, quit 10 years ago.  10.Allergy to CODEINE.   PROCEDURES:  On August 14, 2007,  1. Exploratory laparotomy.  2. Resection grafting of the infrarenal abdominal aortic aneurysm,      insertion of aorta by common iliac graft using 14- x 8-mm      Hemashield Dacron graft by Dr. Kellie [name].   CONSULTS:  General Surgery, Haywood Lasso, MD   BRIEF HISTORY:  Jessica [name] is a 77 year old female who was found to have  an abdominal aortic aneurysm, measured 6.7 cm in maximum diameter.  She  was not felt to be a candidate for stent grafting.  Dr. Kellie [name]  recommended open resection due to size of the aneurysm.  She had a CT  angiogram on August 12, 2007, to confirm findings since the aneurysm first  noted on a chest CT.  The aneurysm was noted to have a very short neck  to angulate, directed to the right.  The neck was very regular making  unable to be repaired via stent grafting.  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  Dr. Kellie [name] discussed the findings with Radiology, Dr.  Zigmund Daniel, as well as general surgeon, Dr. Margot Chimes regarding their opinion  on how to proceed with further evaluation.  It was ultimately decided  that Dr. Margot Chimes would be involved with exploratory laparotomy to  visualize the mass and then a decision would be made regarding to  proceed with resection of the mass versus repairing her aneurysm.   HOSPITAL COURSE:  Jessica [name] was electively admitted to [facility] on August 14, 2007.  She was taken to the operating room, and  first underwent exploratory laparotomy by Dr. Kellie [name] and Dr. Margot Chimes.  Intraoperative findings included 7-cm mass.  It seemed to be attached  arising from the lateral aspect of the cecum that was described as well  circumscribed and did not appear to be invading anything.  It was right  at the base of the appendix as well and appeared somewhat cystic and not  necrotic.  It was mobile and looked like there was some lateral  attachment that would come up easily.  However, Dr. Margot Chimes felt that  resecting would require a right colectomy.  Ultimately, it felt that  this could be done in 3-4 weeks after she had recovered from her  abdominal aortic aneurysm repair.  Subsequently, Dr. Kellie [name] proceeded  with open repair of aneurysm.  Postoperatively, she was taken to the  surgical intensive care unit and remained there to postoperative day #2.  Her postoperative course at this time has been relatively uneventful.  She has been treated on few occasions for hypokalemia with potassium IV  run.  She also was monitored for thrombocytopenia, but we have noticed a  rising trend in her platelets.  She has had no obvious bleeding.  Her  incision has remained intact without signs of infection.  She has  remained hemodynamically  stable.  Her labs as of August 20, 2007, showed a  white count of 6, hemoglobin 10.8, hematocrit 31.6, and platelet count  of 237.  Sodium 138, potassium 3.9, chloride 100, CO2 33, BUN 15,  creatinine 1.06, and blood glucose of 100.  On postoperative day #3, she  was started on sips of clear liquid diet.  Over the course of the next  few days, she was advanced to a regular diet.  If she is able to  tolerate regular food, we anticipate that she will be ready for  discharge home on August 20, 2007.  At this time, she has had a  postoperative bowel movement and was voiding following the Foley  catheter removal and has begun ambulating.  Home health nurse and home  health aid has been requested for discharge.  Currently, Jessica [name]  remained in stable condition.   DISCHARGE MEDICATIONS:  1. Multivitamin 1 daily.  2. Aspirin 81 mg 1 daily.  3. Simvastatin 20 mg 1 q.a.m.  4. Lisinopril/HCTZ 10/12.5 mg daily.  5. Spiriva HandiHaler 18 mcg daily as needed.  6. ProAir HFA as needed.  7. Asmanex 220 mcg as needed.  8. Percocet 5/325 mg 1 tablet p.o. every 4 hours p.r.n. pain.   DISCHARGE INSTRUCTIONS:  To continue a heart-healthy diet.  Increase  activity slowly, may shower, and clean her incisions gently with soap  and water.  She should avoid driving or heavy lifting for the next 4  weeks.  She should have her staples removed in 7-10 days, and will see  Dr. Kellie [name] in approximately 3 weeks with ABIs; our  office will contact her regarding specific appointment, date, and time.  She should call as needed if she develops fever greater than 101,  redness or drainage from her incision site, persistent nausea, vomiting,  or abdominal pain.  She is instructed to contact Dr. Margot Chimes for a 3- to  4-week appointment to discuss scheduling her right colectomy.      Jessica [name], P.A.      Jessica [name], M.D.  Electronically Signed    AWZ/MEDQ  D:  08/19/2007  T:  08/20/2007  Job:   [id number]   cc:   Haywood Lasso, M.D.  Thomas C. Verl Blalock, MD, [facility]  Deboraha Sprang, MD, [facility]  Aram Beecham  Dr. Kellie [name]

## 2010-06-06 NOTE — Assessment & Plan Note (Signed)
OFFICE VISIT   Jessica [name], Jessica [name]  DOB:  [birth year]                                       12/16/2007  [id number]   The patient returns today for final followup regarding her two recent  surgical procedures.  She had an abdominal aortic aneurysm resection  performed by me in July of this year.  She also had a mass in the right  colon arising from the cecum and Dr. Margot Chimes on September 30 performed a  right hemicolectomy.  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  She was evaluated by an oncologist who recommended no further treatment.  He will follow her in the spring with a CT scan of the abdomen.  She has  been having no significant discomfort from her abdominal incision.  Her  appetite is returning to normal as is her energy level.   Blood pressure 117/71, heart rate 88, respirations 12.  Abdominal  incision is well-healed with no evidence of ventral hernia.  No masses  are palpable.  She has 3+ femoral, popliteal and posterior tibial pulses  bilaterally.  She continues to take one 81 mg aspirin tablet per day.   I have reassured her regarding her progress and I think she is doing  quite well and I would be happy to follow her in the future on a p.r.n.  basis.   Nelda Severe Kellie Simmering, M.D.  Electronically Signed   JDL/MEDQ  D:  12/16/2007  T:  12/17/2007  Job:  [id number]   cc:   Thomas C. Verl Blalock, MD, [facility]  Dr Rosaura Carpenter

## 2010-06-07 ENCOUNTER — Ambulatory Visit (INDEPENDENT_AMBULATORY_CARE_PROVIDER_SITE_OTHER): Payer: MEDICARE | Admitting: Emergency Medicine

## 2010-06-07 ENCOUNTER — Telehealth: Payer: Self-pay | Admitting: *Deleted

## 2010-06-07 ENCOUNTER — Telehealth: Payer: Self-pay

## 2010-06-07 DIAGNOSIS — I4891 Unspecified atrial fibrillation: Secondary | ICD-10-CM

## 2010-06-07 DIAGNOSIS — Z7901 Long term (current) use of anticoagulants: Secondary | ICD-10-CM

## 2010-06-07 LAB — POCT INR: INR: 2.5

## 2010-06-07 NOTE — Telephone Encounter (Signed)
Pt states she takes this for her COPD, another prior Josem Kaufmann form is on your desk.

## 2010-06-07 NOTE — Telephone Encounter (Signed)
I dont see an office note where I have met her. Dont know what to say. Does she want to come into clinic so she can establish care?

## 2010-06-07 NOTE — Telephone Encounter (Signed)
Faxed PA form to [phone].  Patient stated that she has used Spiriva and Flovent and did not have any trouble with either one.  I advised her that the insurance company most likely will not approve the Ventolin because she has not tried and failed other medications first.

## 2010-06-07 NOTE — Telephone Encounter (Signed)
Pt complains of new onset x 3 weeks ago of leg pain and cramping in thighs.  Pt states having difficulty getting up and moving in am, extremely difficult to pick up paper in am. Pt questions whether this is secondary to her Simvastatin which she has been taking for 18+ years.  Takes Tylenol for prn pain relief, but pain returns.Please call and advise pt of recommendations.  Thanks [employer]

## 2010-06-08 NOTE — Telephone Encounter (Signed)
In my box

## 2010-06-08 NOTE — Telephone Encounter (Signed)
Prior auth given for ventolin, advised pharmacy, approval letter placed on doctors desk for signature and scanning.

## 2010-06-08 NOTE — Telephone Encounter (Signed)
Spoke to pt, she saw you once to est care 08/2009. Pt had 6 mo f/u scheduled for 08/2010, I have rescheduled her to come in next week, she does state Tylenol has been helping pain, but would still like to be seen. Pt will call back with any other issues in the meantime.

## 2010-06-09 NOTE — Op Note (Signed)
NAMEFEIGY, BUENGER                ACCOUNT NO.:  [id number]   MEDICAL RECORD NO.:  [id number]           PATIENT TYPE:   LOCATION:                                 FACILITY:   PHYSICIAN:  Deboraha Sprang, MD, FACCDATE OF BIRTH:  1929-09-23   DATE OF PROCEDURE:  08/26/2008  DATE OF DISCHARGE:                               OPERATIVE REPORT   PREOPERATIVE DIAGNOSIS:  Complete heart block.   POSTOPERATIVE DIAGNOSIS:  Complete heart block.   PROCEDURE:  Explantation of previously implanted device, implantation of  a new pacemaker.   Following obtaining informed consent, the patient was brought to the  electrophysiology laboratory and placed on the fluoroscopic table in the  supine position.  The details were scant given the 1 year interlude.  Apparently, the pocket was opened.  Previously, the previously  identified leads needed to be replaced.  The ventricular lead St[employer] Jude  1688TC 58 cm, serial number [id number], and atrial lead 5076 from  [employer] 45 cm length, serial number [id number] was inserted.  The  bipolar P-wave was 1.6 with pacing impedance of 40, threshold of 1 V at  0.5, current at threshold is 1 mA.  The ventricular paced R-wave was 10  with a pace impedance of 727, threshold 1.1 V at 0.5 msec, current at  threshold was 1.2 mA.  The leads were attached to a [employer]. Jude  pulse generator, serial number [id number], model 5826.  P-synchronous  pacing was presumably identified.  The pocket was copiously irrigated  with antibiotic-containing saline solution.  The previous leads were  capped.  The new device was implanted and secured to the prepectoral  fascia.  The wound was closed in 3 layers in the normal fashion.  The  wound was washed and dried, and a benzoin and Steri-Strip dressing was  applied.  Needle counts, sponge counts, and instrument counts were  correct at the end of the procedure according to the staff.  The patient  tolerated the procedure without apparent  complication.      Deboraha Sprang, MD, [facility]  Electronically Signed     SCK/MEDQ  D:  08/26/2008  T:  08/27/2008  Job:  [id number]

## 2010-06-14 ENCOUNTER — Encounter: Payer: Self-pay | Admitting: Cardiovascular Disease

## 2010-06-14 ENCOUNTER — Ambulatory Visit (INDEPENDENT_AMBULATORY_CARE_PROVIDER_SITE_OTHER): Payer: Medicare Other | Admitting: Cardiovascular Disease

## 2010-06-14 DIAGNOSIS — E785 Hyperlipidemia, unspecified: Secondary | ICD-10-CM

## 2010-06-14 DIAGNOSIS — Z950 Presence of cardiac pacemaker: Secondary | ICD-10-CM

## 2010-06-14 DIAGNOSIS — I442 Atrioventricular block, complete: Secondary | ICD-10-CM

## 2010-06-14 DIAGNOSIS — I10 Essential (primary) hypertension: Secondary | ICD-10-CM

## 2010-06-14 DIAGNOSIS — I714 Abdominal aortic aneurysm, without rupture: Secondary | ICD-10-CM

## 2010-06-14 DIAGNOSIS — I6529 Occlusion and stenosis of unspecified carotid artery: Secondary | ICD-10-CM

## 2010-06-14 DIAGNOSIS — I4891 Unspecified atrial fibrillation: Secondary | ICD-10-CM

## 2010-06-14 MED ORDER — LISINOPRIL-HYDROCHLOROTHIAZIDE 10-12.5 MG PO TABS: 0.5000 | ORAL_TABLET | Freq: Every day | ORAL | Status: DC

## 2010-06-14 MED ORDER — ATORVASTATIN CALCIUM 40 MG PO TABS: 40.0000 mg | ORAL_TABLET | Freq: Every day | ORAL | Status: DC

## 2010-06-14 MED ORDER — METOPROLOL TARTRATE 25 MG PO TABS: 25.0000 mg | ORAL_TABLET | Freq: Two times a day (BID) | ORAL | Status: DC

## 2010-06-14 NOTE — Assessment & Plan Note (Addendum)
Blood pressure is well controlled on today's visit. Metoprolol tartrate 25 mg b.i.d. Was added for rate control as well as rhythm control given history of atrial fibrillation. Her lisinopril HCT was cut in half.

## 2010-06-14 NOTE — Assessment & Plan Note (Addendum)
She is maintaining sinus rhythm though she does have tachycardia. She reports having heart rates in the 100s on visits to other doctors and when she checks her pulse at home. This is at rest. Metoprolol tartrate will be started with close monitoring of her blood pressure and heart rate.

## 2010-06-14 NOTE — Assessment & Plan Note (Signed)
Mild carotid disease by previous ultrasound. We'll continue aggressive medical management.

## 2010-06-14 NOTE — Assessment & Plan Note (Signed)
Followup with Dr. Caryl Comes for pacemaker every 6 months. She is on warfarin given history of atrial fibrillation and her risk factors.

## 2010-06-14 NOTE — Progress Notes (Signed)
   Patient ID: Jessica [name], female    DOB: 04/18/1929, 77 y.o.   MRN: [id number]  HPI Comments: Jessica [name] has a history of peripheral vascular disease, status post abdominal aortic aneurysm repair in 2009,  history of complete heart block status post pacemaker implant, herna repair in 2009 with post op atrial fib, HTN, prior TIA per the notes, carotid u/s showing mild carotid disease, previous pains in her  neck radiating to her arms bilaterally occurring at night and awakening her from sleep, relieved by repositioning herself in bed. She presents for routine follow up.   She states that she has been doing well. She does have pain in both of her legs radiating down her hamstrings. It is worse in the morning but seems to continue to part of the day. She does not walk on a regular basis and in fact has been very sedentary.  No chest pain  with ambulation. No lightheadedness, diaphoresis with activity.     EKG shows NSR with LBBB, rate 93 bpm.  Nonspecific ST changes consistent with bundle branch block      Review of Systems  HENT: Negative.   Eyes: Negative.   Respiratory: Negative.   Cardiovascular: Negative.   Gastrointestinal: Negative.   Musculoskeletal: Positive for gait problem.       Leg pain in the hamstrings bilaterally  Skin: Negative.   Neurological: Positive for weakness.  Hematological: Negative.   Psychiatric/Behavioral: Negative.   All other systems reviewed and are negative.    BP 126/70  Pulse 93  Ht 5\' 7"  (1.702 m)  Wt 147 lb 12.8 oz (67.042 kg)  BMI 23.15 kg/m2   Physical Exam  Nursing note and vitals reviewed. Constitutional: She is oriented to person, place, and time. She appears well-developed and well-nourished.  HENT:  Head: Normocephalic.  Nose: Nose normal.  Mouth/Throat: Oropharynx is clear and moist.  Eyes: Conjunctivae are normal. Pupils are equal, round, and reactive to light.  Neck: Normal range of motion. Neck supple. No JVD present. Carotid  bruit is present.  Cardiovascular: Normal rate, regular rhythm, S1 normal, S2 normal and intact distal pulses.  Exam reveals no gallop and no friction rub.   Murmur heard.  Crescendo systolic murmur is present with a grade of 2/6  Pulmonary/Chest: Effort normal and breath sounds normal. No respiratory distress. She has no wheezes. She has no rales. She exhibits no tenderness.  Abdominal: Soft. Bowel sounds are normal. She exhibits no distension. There is no tenderness.  Musculoskeletal: Normal range of motion. She exhibits no edema and no tenderness.  Lymphadenopathy:    She has no cervical adenopathy.  Neurological: She is alert and oriented to person, place, and time. Coordination normal.  Skin: Skin is warm and dry. No rash noted. No erythema.  Psychiatric: She has a normal mood and affect. Her behavior is normal. Judgment and thought content normal.         Assessment and Plan

## 2010-06-14 NOTE — Assessment & Plan Note (Signed)
She has followup with Dr. Kellie Simmering for ultrasound and clinic visit.

## 2010-06-14 NOTE — Patient Instructions (Signed)
Please start lipitor 40 mg daily, Stop simvastatin Start metoprolol 25 mg in the Am and PM (this is for your heart rate) Cut the lisinopril/HCT in 1/2 You are doing well. Please call us with your pulse rates over the next few weeks. Please call us if you have new issues that need to be addressed before your next appt.  We will call you for a follow up Appt. In 6 months

## 2010-06-14 NOTE — Assessment & Plan Note (Addendum)
We have talked to her about her cholesterol. Last check was August 2011. LDL is above goal. Ideally we would like her less than 70. We will change her simvastatin 20 mg to Lipitor 40 mg daily With a check of her cholesterol in 3 months time.

## 2010-07-05 ENCOUNTER — Telehealth: Payer: Self-pay | Admitting: *Deleted

## 2010-07-05 ENCOUNTER — Ambulatory Visit (INDEPENDENT_AMBULATORY_CARE_PROVIDER_SITE_OTHER): Payer: Medicare Other | Admitting: Emergency Medicine

## 2010-07-05 DIAGNOSIS — I4891 Unspecified atrial fibrillation: Secondary | ICD-10-CM

## 2010-07-05 DIAGNOSIS — Z7901 Long term (current) use of anticoagulants: Secondary | ICD-10-CM

## 2010-07-05 LAB — POCT INR: INR: 3.9

## 2010-07-05 NOTE — Telephone Encounter (Signed)
Pt in today for coumadin visit and dropped off recent BP/ HR numbers and have put this list in your folder. Her HR has greatly improved- running average of 60-65. Last ov 5/23 HR of 93 and at previous MD visits it had been in 100s. Pt was started on metoprolol tartrate 25mg  bid at ov and has no c/o today.

## 2010-07-14 ENCOUNTER — Other Ambulatory Visit: Payer: Self-pay | Admitting: *Deleted

## 2010-07-14 MED ORDER — PAROXETINE HCL 10 MG PO TABS: 5.0000 mg | ORAL_TABLET | Freq: Every day | ORAL | Status: DC

## 2010-07-19 ENCOUNTER — Telehealth: Payer: Self-pay | Admitting: *Deleted

## 2010-07-19 ENCOUNTER — Ambulatory Visit (INDEPENDENT_AMBULATORY_CARE_PROVIDER_SITE_OTHER): Payer: Medicare Other | Admitting: Emergency Medicine

## 2010-07-19 DIAGNOSIS — I4891 Unspecified atrial fibrillation: Secondary | ICD-10-CM

## 2010-07-19 DIAGNOSIS — Z7901 Long term (current) use of anticoagulants: Secondary | ICD-10-CM

## 2010-07-19 LAB — POCT INR: INR: 2.7

## 2010-07-19 NOTE — Telephone Encounter (Signed)
Called pt regarding phone msg on 07/05/10, notified pt per Dr. Rockey Situ that BP/HR look great. No changes to be made.

## 2010-08-09 ENCOUNTER — Ambulatory Visit (INDEPENDENT_AMBULATORY_CARE_PROVIDER_SITE_OTHER): Payer: Medicare Other | Admitting: Emergency Medicine

## 2010-08-09 DIAGNOSIS — I4891 Unspecified atrial fibrillation: Secondary | ICD-10-CM

## 2010-08-09 DIAGNOSIS — Z7901 Long term (current) use of anticoagulants: Secondary | ICD-10-CM

## 2010-08-09 LAB — POCT INR: INR: 2.6

## 2010-09-06 ENCOUNTER — Ambulatory Visit (INDEPENDENT_AMBULATORY_CARE_PROVIDER_SITE_OTHER): Payer: Medicare Other | Admitting: Emergency Medicine

## 2010-09-06 DIAGNOSIS — Z7901 Long term (current) use of anticoagulants: Secondary | ICD-10-CM

## 2010-09-06 DIAGNOSIS — I4891 Unspecified atrial fibrillation: Secondary | ICD-10-CM

## 2010-09-06 LAB — POCT INR: INR: 2.6

## 2010-10-02 ENCOUNTER — Encounter: Payer: Self-pay | Admitting: Vascular Surgery

## 2010-10-03 ENCOUNTER — Encounter (INDEPENDENT_AMBULATORY_CARE_PROVIDER_SITE_OTHER): Payer: Medicare Other | Admitting: *Deleted

## 2010-10-03 ENCOUNTER — Ambulatory Visit (INDEPENDENT_AMBULATORY_CARE_PROVIDER_SITE_OTHER): Payer: Medicare Other | Admitting: Vascular Surgery

## 2010-10-03 ENCOUNTER — Encounter: Payer: Self-pay | Admitting: Vascular Surgery

## 2010-10-03 VITALS — BP 119/56 | HR 61 | Resp 20 | Ht 67.0 in | Wt 145.0 lb

## 2010-10-03 DIAGNOSIS — Z48812 Encounter for surgical aftercare following surgery on the circulatory system: Secondary | ICD-10-CM

## 2010-10-03 DIAGNOSIS — I714 Abdominal aortic aneurysm, without rupture: Secondary | ICD-10-CM

## 2010-10-03 DIAGNOSIS — I739 Peripheral vascular disease, unspecified: Secondary | ICD-10-CM

## 2010-10-03 NOTE — Progress Notes (Signed)
Subjective:     Patient ID: Jessica [name], female   DOB: September 01, 1929, 77 y.o.   MRN: [id number]  HPI this 77 year old female is one year status post resection and grafting of abdominal aortic aneurysm. She has done well from that standpoint. 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 She developed a ventral hernia. This was subsequently repaired by Dr. Donne Hazel. He denies any active symptoms involving her abdomen or back. He denies claudication symptoms. She denies neurologic symptoms such as any parasites a camera subjective diplopia blurred vision or syncope.  Past Medical History  Diagnosis Date  . Atrioventricular block, complete   . Cardiac pacemaker in situ   . Abdominal aneurysm without mention of rupture   . Hernia   . AAA (abdominal aortic aneurysm)   . Hypertension   . Hyperlipidemia     History  Substance Use Topics  . Smoking status: Former Smoker -- 50 years    Types: Cigarettes    Quit date: 10/03/1995  . Smokeless tobacco: Never Used  . Alcohol Use: Yes     occasionally    Family History  Problem Relation Age of Onset  . Heart disease Father   . Cancer Brother     Allergies  Allergen Reactions  . Codeine   . Morphine     Current outpatient prescriptions:albuterol (VENTOLIN HFA) 108 (90 BASE) MCG/ACT inhaler, Inhale two puffs every 4-6 hours as needed, Disp: 1 Inhaler, Rfl: 3;  atorvastatin (LIPITOR) 40 MG tablet, Take 1 tablet (40 mg total) by mouth daily., Disp: 30 tablet, Rfl: 6;  Cyanocobalamin (B-12 PO), Take by mouth.  , Disp: , Rfl:  cyclobenzaprine (FLEXERIL) 5 MG tablet, Take 1 tablet (5 mg total) by mouth 2 (two) times daily as needed (neck pain)., Disp: 30 tablet, Rfl: 1;  lisinopril-hydrochlorothiazide (PRINZIDE,ZESTORETIC) 10-12.5 MG per tablet, Take 0.5 tablets by mouth daily., Disp: 30 tablet, Rfl: 6;  metoprolol tartrate (LOPRESSOR) 25 MG tablet, Take 1 tablet (25 mg total) by mouth 2 (two) times  daily., Disp: 60 tablet, Rfl: 6 PARoxetine (PAXIL) 10 MG tablet, Take 0.5 tablets (5 mg total) by mouth daily., Disp: 30 tablet, Rfl: 6;  Simethicone (SB ANTI-GAS) 180 MG CAPS, Take by mouth. Take one tablet by mouth as needed. , Disp: , Rfl: ;  warfarin (COUMADIN) 5 MG tablet, Take 1 tablet (5 mg total) by mouth as directed., Disp: 100 tablet, Rfl: 3;  tiotropium (SPIRIVA HANDIHALER) 18 MCG inhalation capsule, Place 18 mcg into inhaler and inhale as needed.  , Disp: , Rfl:  tiotropium (SPIRIVA) 18 MCG inhalation capsule, Place 18 mcg into inhaler and inhale. 1 tablet by mouth at bedtime as needed. , Disp: , Rfl:   BP 119/56  Pulse 61  Resp 20  Ht 5\' 7"  (1.702 m)  Wt 145 lb (65.772 kg)  BMI 22.71 kg/m2  Body mass index is 22.71 kg/(m^2).       Review of Systems denies chest pain dyspnea on exertion PND orthopnea no GI or GU symptoms no neurologic symptoms all other systems are negative and a complete review of systems     Objective:   Physical Exam blood pressure 119/56 heart rate 61 respirations 20 Gen. she is alert and oriented x3 in no apparent distress HEENT exam normal for age Chest good auscultation no rhonchi or wheezing Cardiovascular pacemaker in place regular rhythm no murmurs Abdomen soft nontender with no masses or ventral hernia 3+ femoral and dorsalis pedis  pulses palpable bilaterally Neurologic exam normal Skin free of rashes Musculoskeletal free of deformities  Today I ordered lower extremity arterial Doppler exams which revealed ABI exceeding 1.0 bilaterally    Assessment:     Doing well post abdominal aortic aneurysm resection one year ago    Plan:     Return on a when necessary basis

## 2010-10-04 ENCOUNTER — Ambulatory Visit (INDEPENDENT_AMBULATORY_CARE_PROVIDER_SITE_OTHER): Payer: Medicare Other | Admitting: Emergency Medicine

## 2010-10-04 DIAGNOSIS — Z7901 Long term (current) use of anticoagulants: Secondary | ICD-10-CM

## 2010-10-04 DIAGNOSIS — I4891 Unspecified atrial fibrillation: Secondary | ICD-10-CM

## 2010-10-04 LAB — POCT INR: INR: 2.1

## 2010-10-18 ENCOUNTER — Encounter: Payer: Self-pay | Admitting: Internal Medicine

## 2010-10-20 ENCOUNTER — Encounter: Payer: Medicare Other | Admitting: Internal Medicine

## 2010-10-20 LAB — CBC
HCT: 28.2 — ABNORMAL LOW
HCT: 30.4 — ABNORMAL LOW
HCT: 30.5 — ABNORMAL LOW
HCT: 31.5 — ABNORMAL LOW
HCT: 31.6 — ABNORMAL LOW
HCT: 38.8
Hemoglobin: 10.3 — ABNORMAL LOW
Hemoglobin: 10.3 — ABNORMAL LOW
Hemoglobin: 10.7 — ABNORMAL LOW
Hemoglobin: 10.8 — ABNORMAL LOW
Hemoglobin: 13.1
Hemoglobin: 9.7 — ABNORMAL LOW
MCHC: 33.7
MCHC: 33.9
MCHC: 33.9
MCHC: 34
MCHC: 34.2
MCHC: 34.4
MCV: 89.1
MCV: 89.8
MCV: 90.6
MCV: 90.8
MCV: 91.4
MCV: 92.1
Platelets: 111 — ABNORMAL LOW
Platelets: 137 — ABNORMAL LOW
Platelets: 163
Platelets: 88 — ABNORMAL LOW
Platelets: 90 — ABNORMAL LOW
Platelets: 96 — ABNORMAL LOW
RBC: 3.16 — ABNORMAL LOW
RBC: 3.34 — ABNORMAL LOW
RBC: 3.36 — ABNORMAL LOW
RBC: 3.48 — ABNORMAL LOW
RBC: 3.51 — ABNORMAL LOW
RBC: 4.21
RDW: 13.6
RDW: 14.2
RDW: 14.4
RDW: 14.5
RDW: 14.6
RDW: 14.9
WBC: 10.5
WBC: 6
WBC: 6.2
WBC: 6.9
WBC: 7.4
WBC: 8.8

## 2010-10-20 LAB — POCT I-STAT 3, ART BLOOD GAS (G3+)
Acid-base deficit: 1
Bicarbonate: 25 — ABNORMAL HIGH
O2 Saturation: 94
Operator id: 256891
Patient temperature: 37.1
TCO2: 26
pCO2 arterial: 44.6
pH, Arterial: 7.358
pO2, Arterial: 75 — ABNORMAL LOW

## 2010-10-20 LAB — POTASSIUM
Potassium: 3.9
Potassium: 3.9

## 2010-10-20 LAB — POCT I-STAT 7, (LYTES, BLD GAS, ICA,H+H)
Acid-Base Excess: 3 — ABNORMAL HIGH
Acid-base deficit: 1
Bicarbonate: 23.7
Bicarbonate: 26.6 — ABNORMAL HIGH
Calcium, Ion: 1.11 — ABNORMAL LOW
Calcium, Ion: 1.13
HCT: 23 — ABNORMAL LOW
HCT: 30 — ABNORMAL LOW
Hemoglobin: 10.2 — ABNORMAL LOW
Hemoglobin: 7.8 — CL
O2 Saturation: 100
O2 Saturation: 100
Operator id: 173791
Operator id: 300801
Potassium: 3.5
Potassium: 3.7
Sodium: 137
Sodium: 138
TCO2: 25
TCO2: 28
pCO2 arterial: 35.4
pCO2 arterial: 38.6
pH, Arterial: 7.396
pH, Arterial: 7.485 — ABNORMAL HIGH
pO2, Arterial: 564 — ABNORMAL HIGH
pO2, Arterial: 565 — ABNORMAL HIGH

## 2010-10-20 LAB — BLOOD GAS, ARTERIAL
Acid-base deficit: 0.4
Acid-base deficit: 4.1 — ABNORMAL HIGH
Bicarbonate: 21.9
Bicarbonate: 23.4
Drawn by: 181601
Drawn by: 280981
FIO2: 0.21
O2 Content: 6
O2 Saturation: 97.9
O2 Saturation: 98.2
Patient temperature: 98.6
Patient temperature: 98.6
TCO2: 23.4
TCO2: 24.5
pCO2 arterial: 36.3
pCO2 arterial: 50.4 — ABNORMAL HIGH
pH, Arterial: 7.26 — ABNORMAL LOW
pH, Arterial: 7.425 — ABNORMAL HIGH
pO2, Arterial: 102 — ABNORMAL HIGH
pO2, Arterial: 131 — ABNORMAL HIGH

## 2010-10-20 LAB — BASIC METABOLIC PANEL
BUN: 11
BUN: 15
BUN: 20
BUN: 7
BUN: 8
CO2: 22
CO2: 25
CO2: 30
CO2: 31
CO2: 33 — ABNORMAL HIGH
Calcium: 7.4 — ABNORMAL LOW
Calcium: 8 — ABNORMAL LOW
Calcium: 8.7
Calcium: 8.7
Calcium: 9.1
Chloride: 100
Chloride: 102
Chloride: 103
Chloride: 109
Chloride: 94 — ABNORMAL LOW
Creatinine, Ser: 0.91
Creatinine, Ser: 0.93
Creatinine, Ser: 0.97
Creatinine, Ser: 1.06
Creatinine, Ser: 1.42 — ABNORMAL HIGH
GFR calc Af Amer: 44 — ABNORMAL LOW
GFR calc Af Amer: >60
GFR calc Af Amer: >60
GFR calc Af Amer: >60
GFR calc Af Amer: >60
GFR calc non Af Amer: 36 — ABNORMAL LOW
GFR calc non Af Amer: 50 — ABNORMAL LOW
GFR calc non Af Amer: 56 — ABNORMAL LOW
GFR calc non Af Amer: 59 — ABNORMAL LOW
GFR calc non Af Amer: >60
Glucose, Bld: 100 — ABNORMAL HIGH
Glucose, Bld: 106 — ABNORMAL HIGH
Glucose, Bld: 109 — ABNORMAL HIGH
Glucose, Bld: 128 — ABNORMAL HIGH
Glucose, Bld: 152 — ABNORMAL HIGH
Potassium: 3 — ABNORMAL LOW
Potassium: 3.4 — ABNORMAL LOW
Potassium: 3.7
Potassium: 3.9
Potassium: 4.2
Sodium: 135
Sodium: 136
Sodium: 137
Sodium: 138
Sodium: 140

## 2010-10-20 LAB — COMPREHENSIVE METABOLIC PANEL
ALT: 17
ALT: 20
AST: 25
AST: 25
Albumin: 2.4 — ABNORMAL LOW
Albumin: 4.3
Alkaline Phosphatase: 47
Alkaline Phosphatase: 68
BUN: 14
BUN: 15
CO2: 21
CO2: 25
Calcium: 7.6 — ABNORMAL LOW
Calcium: 9.6
Chloride: 103
Chloride: 106
Creatinine, Ser: 0.93
Creatinine, Ser: 1.06
GFR calc Af Amer: >60
GFR calc Af Amer: >60
GFR calc non Af Amer: 50 — ABNORMAL LOW
GFR calc non Af Amer: 59 — ABNORMAL LOW
Glucose, Bld: 103 — ABNORMAL HIGH
Glucose, Bld: 118 — ABNORMAL HIGH
Potassium: 3.5
Potassium: 4.1
Sodium: 134 — ABNORMAL LOW
Sodium: 137
Total Bilirubin: 1
Total Bilirubin: 1.1
Total Protein: 4.1 — ABNORMAL LOW
Total Protein: 6.6

## 2010-10-20 LAB — CROSSMATCH
ABO/RH(D): O POS
Antibody Screen: NEGATIVE

## 2010-10-20 LAB — PROTIME-INR
INR: 0.9
INR: 1.3
Prothrombin Time: 12.3
Prothrombin Time: 16.5 — ABNORMAL HIGH

## 2010-10-20 LAB — URINALYSIS, ROUTINE W REFLEX MICROSCOPIC
Bilirubin Urine: NEGATIVE
Glucose, UA: NEGATIVE
Hgb urine dipstick: NEGATIVE
Ketones, ur: NEGATIVE
Nitrite: NEGATIVE
Protein, ur: NEGATIVE
Specific Gravity, Urine: 1.02
Urobilinogen, UA: 0.2
pH: 6

## 2010-10-20 LAB — APTT
aPTT: 30
aPTT: 31

## 2010-10-20 LAB — ABO/RH: ABO/RH(D): O POS

## 2010-10-23 LAB — CBC
HCT: 36.2
HCT: 27.6 — ABNORMAL LOW
HCT: 29.8 — ABNORMAL LOW
HCT: 29.8 — ABNORMAL LOW
Hemoglobin: 12.4
Hemoglobin: 10.2 — ABNORMAL LOW
Hemoglobin: 9.3 — ABNORMAL LOW
Hemoglobin: 9.9 — ABNORMAL LOW
MCHC: 34.1
MCHC: 33.1
MCHC: 33.7
MCHC: 34.1
MCV: 89.2
MCV: 90.1
MCV: 90.8
MCV: 91.5
Platelets: 168
Platelets: 106 — ABNORMAL LOW
Platelets: 123 — ABNORMAL LOW
Platelets: 96 — ABNORMAL LOW
RBC: 4.07
RBC: 3.06 — ABNORMAL LOW
RBC: 3.25 — ABNORMAL LOW
RBC: 3.29 — ABNORMAL LOW
RDW: 14.2
RDW: 14.4
RDW: 14.6
RDW: 14.6
WBC: 6.9
WBC: 5
WBC: 6.3
WBC: 6.4

## 2010-10-23 LAB — BASIC METABOLIC PANEL
BUN: 27 — ABNORMAL HIGH
BUN: 27 — ABNORMAL HIGH
BUN: 8
CO2: 21
CO2: 23
CO2: 24
Calcium: 8.2 — ABNORMAL LOW
Calcium: 8.4
Calcium: 8.7
Chloride: 108
Chloride: 111
Chloride: 111
Creatinine, Ser: 0.85
Creatinine, Ser: 1.73 — ABNORMAL HIGH
Creatinine, Ser: 2.38 — ABNORMAL HIGH
GFR calc Af Amer: 24 — ABNORMAL LOW
GFR calc Af Amer: 35 — ABNORMAL LOW
GFR calc Af Amer: >60
GFR calc non Af Amer: 20 — ABNORMAL LOW
GFR calc non Af Amer: 29 — ABNORMAL LOW
GFR calc non Af Amer: >60
Glucose, Bld: 114 — ABNORMAL HIGH
Glucose, Bld: 118 — ABNORMAL HIGH
Glucose, Bld: 96
Potassium: 4
Potassium: 4.4
Potassium: 4.7
Sodium: 138
Sodium: 138
Sodium: 138

## 2010-10-23 LAB — DIFFERENTIAL
Basophils Absolute: 0.1
Basophils Absolute: 0
Basophils Relative: 2 — ABNORMAL HIGH
Basophils Relative: 1
Eosinophils Absolute: 0.1
Eosinophils Absolute: 0.1
Eosinophils Relative: 2
Eosinophils Relative: 1
Lymphocytes Relative: 27
Lymphocytes Relative: 14
Lymphs Abs: 1.9
Lymphs Abs: 0.9
Monocytes Absolute: 0.5
Monocytes Absolute: 0.5
Monocytes Relative: 8
Monocytes Relative: 8
Neutro Abs: 4.3
Neutro Abs: 4.9
Neutrophils Relative %: 62
Neutrophils Relative %: 77

## 2010-10-23 LAB — CEA: CEA: 6 — ABNORMAL HIGH

## 2010-10-23 LAB — COMPREHENSIVE METABOLIC PANEL
ALT: 14
AST: 20
Albumin: 4.2
Alkaline Phosphatase: 62
BUN: 24 — ABNORMAL HIGH
CO2: 27
Calcium: 10
Chloride: 103
Creatinine, Ser: 1.2
GFR calc Af Amer: 53 — ABNORMAL LOW
GFR calc non Af Amer: 44 — ABNORMAL LOW
Glucose, Bld: 94
Potassium: 4.1
Sodium: 139
Total Bilirubin: 0.9
Total Protein: 6.3

## 2010-10-23 LAB — HEMOGLOBIN AND HEMATOCRIT, BLOOD
HCT: 29.3 — ABNORMAL LOW
Hemoglobin: 9.9 — ABNORMAL LOW

## 2010-10-23 LAB — VITAMIN B12: Vitamin B-12: 222 (ref 211–911)

## 2010-10-23 LAB — FERRITIN: Ferritin: 430 — ABNORMAL HIGH (ref 10–291)

## 2010-10-23 LAB — CARDIAC PANEL(CRET KIN+CKTOT+MB+TROPI)
CK, MB: 4.8 — ABNORMAL HIGH
Relative Index: 1.2
Total CK: 400 — ABNORMAL HIGH
Troponin I: 0.02

## 2010-10-23 LAB — IRON AND TIBC
Iron: 13 — ABNORMAL LOW
Saturation Ratios: 8 — ABNORMAL LOW
TIBC: 163 — ABNORMAL LOW
UIBC: 150

## 2010-10-23 LAB — FOLATE: Folate: 15.4

## 2010-10-23 LAB — RETICULOCYTES
RBC.: 3.42 — ABNORMAL LOW
Retic Count, Absolute: 30.8
Retic Ct Pct: 0.9

## 2010-11-01 ENCOUNTER — Encounter: Payer: Self-pay | Admitting: Internal Medicine

## 2010-11-01 ENCOUNTER — Ambulatory Visit (INDEPENDENT_AMBULATORY_CARE_PROVIDER_SITE_OTHER): Payer: Medicare Other | Admitting: Emergency Medicine

## 2010-11-01 DIAGNOSIS — I4891 Unspecified atrial fibrillation: Secondary | ICD-10-CM

## 2010-11-01 DIAGNOSIS — Z7901 Long term (current) use of anticoagulants: Secondary | ICD-10-CM

## 2010-11-01 LAB — POCT INR: INR: 2.6

## 2010-11-02 ENCOUNTER — Ambulatory Visit (INDEPENDENT_AMBULATORY_CARE_PROVIDER_SITE_OTHER): Payer: Medicare Other | Admitting: Internal Medicine

## 2010-11-02 ENCOUNTER — Encounter: Payer: Self-pay | Admitting: Internal Medicine

## 2010-11-02 DIAGNOSIS — R0600 Dyspnea, unspecified: Secondary | ICD-10-CM | POA: Insufficient documentation

## 2010-11-02 DIAGNOSIS — I442 Atrioventricular block, complete: Secondary | ICD-10-CM

## 2010-11-02 DIAGNOSIS — Z950 Presence of cardiac pacemaker: Secondary | ICD-10-CM

## 2010-11-02 LAB — PACEMAKER DEVICE OBSERVATION
AL AMPLITUDE: 2.8 mv
AL IMPEDENCE PM: 426 Ohm
AL THRESHOLD: 0.5 V
ATRIAL PACING PM: 63
BAMS-0001: 150 {beats}/min
BAMS-0003: 70 {beats}/min
BATTERY VOLTAGE: 2.79 V
DEVICE MODEL PM: 1236137
RV LEAD AMPLITUDE: 5 mv
RV LEAD IMPEDENCE PM: 482 Ohm
RV LEAD THRESHOLD: 0.625 V
VENTRICULAR PACING PM: 100

## 2010-11-02 NOTE — Patient Instructions (Addendum)
Need to have a [employer]. Your physician has requested that you have en exercise stress myoview. For further information please visit [email or web address]. Please follow instruction sheet, as given. [facility] is scheduled for November 15, 2010 at 11:30 am at [facility] in [place] on [address].. Follow up in 6 months with Dr. Caryl Comes.

## 2010-11-02 NOTE — Progress Notes (Signed)
  HPI  Jessica [name] is a 77 y.o. female  seen in followup for a pacemaker implanted for complete heart block now status post device generator replacement    Her exercise tolerance improved following pacemaker implantation; however, she continues to complain of dyspnea with exertion. She is also told with chest pain. Catheterization is in the remote past and stress testing apparently is in the quite distant past.  She notes her heart rates have been in the 100s sometimes when she is seeing other physicians  She has had atrial fibrillation and is on Coumadin. She has a CHADS VASC score of 6 htn, gender, agex2, vascular disease, prior TIA    she complains of chest pain primarily in her neck radiating to her arms bilaterally occurring at night and awakening her from slee.It is relieved by repositioning herself in bed  Past Medical History  Diagnosis Date  . Atrioventricular block, complete   . Cardiac pacemaker in situ   . Abdominal aneurysm without mention of rupture   . Hernia   . AAA (abdominal aortic aneurysm)   . Hypertension   . Hyperlipidemia     Past Surgical History  Procedure Date  . Ohter     growth on colon surgery  . Tonsillectomy   . Abdominal aortic aneurysm repair   . Hernia repair     Current Outpatient Prescriptions  Medication Sig Dispense Refill  . albuterol (VENTOLIN HFA) 108 (90 BASE) MCG/ACT inhaler Inhale two puffs every 4-6 hours as needed  1 Inhaler  3  . atorvastatin (LIPITOR) 40 MG tablet Take 1 tablet (40 mg total) by mouth daily.  30 tablet  6  . lisinopril-hydrochlorothiazide (PRINZIDE,ZESTORETIC) 10-12.5 MG per tablet Take 0.5 tablets by mouth daily.  30 tablet  6  . metoprolol tartrate (LOPRESSOR) 25 MG tablet Take 1 tablet (25 mg total) by mouth 2 (two) times daily.  60 tablet  6  . PARoxetine (PAXIL) 10 MG tablet Take 0.5 tablets (5 mg total) by mouth daily.  30 tablet  6  . Simethicone (SB ANTI-GAS) 180 MG CAPS Take by mouth. Take one  tablet by mouth as needed.       . warfarin (COUMADIN) 5 MG tablet Take 1 tablet (5 mg total) by mouth as directed.  100 tablet  3    Allergies  Allergen Reactions  . Codeine   . Morphine     Review of Systems negative except from HPI and PMH  Physical Exam Well developed and well nourished in no acute distress HENT normal E scleral and icterus clear Neck Supple JVP flat; carotids brisk and full Clear to ausculation Regular rate and rhythm, S4 Soft with active bowel sounds No clubbing cyanosis and edema Alert and oriented, grossly normal motor and sensory function Skin Warm and Dry    Assessment and  Plan

## 2010-11-02 NOTE — Assessment & Plan Note (Signed)
She continues to have episodes of atrial fibrillation. She is appropriately on anticoagulation

## 2010-11-02 NOTE — Assessment & Plan Note (Signed)
No intrisisnc heart rate. Stable post pacing

## 2010-11-02 NOTE — Assessment & Plan Note (Signed)
The patient's device was interrogated and the information was fully reviewed.  The device was reprogrammed to decrease heart rate response to exercise

## 2010-11-02 NOTE — Assessment & Plan Note (Signed)
Her dyspnea may well be related to either chronotropic excess secondary to her rate sensor as 30% of her heart beats are out of her lower rate and and his description of heart rates in the [id number] certainly is suggestive or possibly an anginal equivalent. Coronary disease is quite likely with her history of AAA. She may also have simply diastolic heart failure given her hypertensive heart disease  Her heart rate with activity today just walking in the office was up to 100 in about one minute supportive of the Idea of chronotropic exccess

## 2010-11-09 ENCOUNTER — Encounter: Payer: Self-pay | Admitting: *Deleted

## 2010-11-15 ENCOUNTER — Ambulatory Visit (HOSPITAL_COMMUNITY): Payer: Medicare Other | Attending: Internal Medicine | Admitting: Radiology

## 2010-11-15 ENCOUNTER — Encounter: Payer: Self-pay | Admitting: Internal Medicine

## 2010-11-15 ENCOUNTER — Ambulatory Visit (INDEPENDENT_AMBULATORY_CARE_PROVIDER_SITE_OTHER): Payer: Medicare Other | Admitting: *Deleted

## 2010-11-15 ENCOUNTER — Encounter: Payer: Self-pay | Admitting: Cardiovascular Disease

## 2010-11-15 VITALS — Ht 65.0 in | Wt 148.0 lb

## 2010-11-15 DIAGNOSIS — Z87891 Personal history of nicotine dependence: Secondary | ICD-10-CM | POA: Insufficient documentation

## 2010-11-15 DIAGNOSIS — R0609 Other forms of dyspnea: Secondary | ICD-10-CM | POA: Insufficient documentation

## 2010-11-15 DIAGNOSIS — R0600 Dyspnea, unspecified: Secondary | ICD-10-CM

## 2010-11-15 DIAGNOSIS — R9431 Abnormal electrocardiogram [ECG] [EKG]: Secondary | ICD-10-CM

## 2010-11-15 DIAGNOSIS — R42 Dizziness and giddiness: Secondary | ICD-10-CM | POA: Insufficient documentation

## 2010-11-15 DIAGNOSIS — R5381 Other malaise: Secondary | ICD-10-CM | POA: Insufficient documentation

## 2010-11-15 DIAGNOSIS — Z950 Presence of cardiac pacemaker: Secondary | ICD-10-CM | POA: Insufficient documentation

## 2010-11-15 DIAGNOSIS — I779 Disorder of arteries and arterioles, unspecified: Secondary | ICD-10-CM | POA: Insufficient documentation

## 2010-11-15 DIAGNOSIS — E785 Hyperlipidemia, unspecified: Secondary | ICD-10-CM | POA: Insufficient documentation

## 2010-11-15 DIAGNOSIS — R0602 Shortness of breath: Secondary | ICD-10-CM

## 2010-11-15 DIAGNOSIS — I10 Essential (primary) hypertension: Secondary | ICD-10-CM | POA: Insufficient documentation

## 2010-11-15 DIAGNOSIS — I739 Peripheral vascular disease, unspecified: Secondary | ICD-10-CM | POA: Insufficient documentation

## 2010-11-15 DIAGNOSIS — I442 Atrioventricular block, complete: Secondary | ICD-10-CM

## 2010-11-15 DIAGNOSIS — Z8673 Personal history of transient ischemic attack (TIA), and cerebral infarction without residual deficits: Secondary | ICD-10-CM | POA: Insufficient documentation

## 2010-11-15 DIAGNOSIS — R0989 Other specified symptoms and signs involving the circulatory and respiratory systems: Secondary | ICD-10-CM | POA: Insufficient documentation

## 2010-11-15 LAB — PACEMAKER DEVICE OBSERVATION
AL AMPLITUDE: 1.8 mv
AL IMPEDENCE PM: 379 Ohm
BAMS-0001: 150 {beats}/min
BAMS-0003: 70 {beats}/min
BATTERY VOLTAGE: 2.79 V
DEVICE MODEL PM: 1236137
RV LEAD AMPLITUDE: 4.1 mv
RV LEAD IMPEDENCE PM: 487 Ohm

## 2010-11-15 MED ORDER — TECHNETIUM TC 99M TETROFOSMIN IV KIT
11.0000 | PACK | Freq: Once | INTRAVENOUS | Status: AC | PRN
  Administered 2010-11-15: 11 via INTRAVENOUS

## 2010-11-15 MED ORDER — TECHNETIUM TC 99M TETROFOSMIN IV KIT
33.0000 | PACK | Freq: Once | INTRAVENOUS | Status: AC | PRN
  Administered 2010-11-15: 33 via INTRAVENOUS

## 2010-11-15 MED ORDER — ADENOSINE (DIAGNOSTIC) 3 MG/ML IV SOLN
0.5600 mg/kg | Freq: Once | INTRAVENOUS | Status: AC
  Administered 2010-11-15: 37.5 mg via INTRAVENOUS

## 2010-11-15 NOTE — Progress Notes (Signed)
PPM check 

## 2010-11-15 NOTE — Progress Notes (Signed)
[facility] [place] [address] [phone]  Cardiology Nuclear Med Study  Jessica [name] is a 77 y.o. female [id number] [birth year]   Nuclear Med Background Indication for Stress Test:  Evaluation for Ischemia History:  No previous documented CAD, >30 years ago Pacemaker Insertion: CHB,([place])>30 years ago Heart Catheterization: Okay per patient,([place]) '09 Echo: EF=55%,and '09 Myocardial Perfusion Study: NL, EF=65%,'09 AAA resection, AFIB,COPD Cardiac Risk Factors: Carotid Disease, History of Smoking, Hypertension, Lipids, PVD and TIA  Symptoms:  Dizziness, DOE, Fatigue, Fatigue with Exertion and Light-Headedness   Nuclear Pre-Procedure Caffeine/Decaff Intake:  None NPO After: 7:30am   Lungs:  Clear , diminished breath sounds IV 0.9% NS with Angio Cath:  22g  IV Site: R Antecubital  IV Started by:  Eliezer Lofts, EMT-P  Chest Size (in):  38 Cup Size: D  Height: 5\' 5"  (1.651 m)  Weight:  148 lb (67.132 kg)  BMI:  Body mass index is 24.63 kg/(m^2). Tech Comments:  Metoprolol held > 12 hours, per patient.    Nuclear Med Study 1 or 2 day study: 1 day  Stress Test Type:  Adenosine  Reading MD: Dola Argyle, MD  Order Authorizing Provider:  Virl Axe, MD  Resting Radionuclide: Technetium 47m Tetrofosmin  Resting Radionuclide Dose: 11.0 mCi   Stress Radionuclide:  Technetium 79m Tetrofosmin  Stress Radionuclide Dose: 33.0 mCi           Stress Protocol Rest HR: 60 Stress HR: 61  Rest BP: 124/78 Stress BP: 115/70  Exercise Time (min): n/a METS: n/a   Predicted Max HR: 141 bpm % Max HR: 43.26 bpm Rate Pressure Product: 7015   Dose of Adenosine (mg):  37.7 Dose of Lexiscan: n/a mg  Dose of Atropine (mg): n/a Dose of Dobutamine: n/a mcg/kg/min (at max HR)  Stress Test Technologist: Irven Baltimore, RN  Nuclear Technologist:  Annye Rusk, CNMT     Rest Procedure:  Myocardial perfusion imaging was performed at rest 45  minutes following the intravenous administration of Technetium 79m Tetrofosmin. Rest ECG: AV Pacing,PAC's  Stress Procedure:  The patient received IV adenosine at 140 mcg/kg/min for 4 minutes.  The EKG was non-diagnostic due to baseline changes. There were rare PVC, and PAC's. The patient complained of chest and throat tightness.  Technetium 29m Tetrofosmin was injected at the 2 minute mark and quantitative spect images were obtained after a 45 minute delay. Stress ECG: No significant change from baseline ECG  QPS Raw Data Images:  Normal; no motion artifact; normal heart/lung ratio. Stress Images:  Normal homogeneous uptake in all areas of the myocardium. Rest Images:  Normal homogeneous uptake in all areas of the myocardium. Subtraction (SDS):  No evidence of ischemia. Transient Ischemic Dilatation (Normal <1.22):  1.13 Lung/Heart Ratio (Normal <0.45):  0.29  Quantitative Gated Spect Images QGS EDV:  62 ml QGS ESV:  20 ml QGS cine images:  Normal Wall Motion QGS EF: 68%  Impression Exercise Capacity:  Adenosine study with no exercise. BP Response:  Normal blood pressure response. Clinical Symptoms:  neck tightness ECG Impression:  No significant ST segment change suggestive of ischemia. Comparison with Prior Nuclear Study: No previous nuclear study performed  Overall Impression:  Normal stress nuclear study.  Dola Argyle

## 2010-11-27 ENCOUNTER — Telehealth: Payer: Self-pay | Admitting: *Deleted

## 2010-11-27 NOTE — Telephone Encounter (Signed)
Pt notified stress test normal per Dr. Caryl Comes.

## 2010-11-29 ENCOUNTER — Ambulatory Visit (INDEPENDENT_AMBULATORY_CARE_PROVIDER_SITE_OTHER): Payer: Medicare Other | Admitting: Emergency Medicine

## 2010-11-29 DIAGNOSIS — I4891 Unspecified atrial fibrillation: Secondary | ICD-10-CM

## 2010-11-29 DIAGNOSIS — Z7901 Long term (current) use of anticoagulants: Secondary | ICD-10-CM

## 2010-11-29 LAB — POCT INR: INR: 3.6

## 2010-12-20 ENCOUNTER — Ambulatory Visit (INDEPENDENT_AMBULATORY_CARE_PROVIDER_SITE_OTHER): Payer: Medicare Other | Admitting: Emergency Medicine

## 2010-12-20 DIAGNOSIS — I4891 Unspecified atrial fibrillation: Secondary | ICD-10-CM

## 2010-12-20 DIAGNOSIS — Z7901 Long term (current) use of anticoagulants: Secondary | ICD-10-CM

## 2010-12-20 LAB — POCT INR: INR: 2.3

## 2010-12-26 ENCOUNTER — Encounter: Payer: Self-pay | Admitting: Cardiovascular Disease

## 2011-01-02 ENCOUNTER — Encounter: Payer: Self-pay | Admitting: Cardiovascular Disease

## 2011-01-02 ENCOUNTER — Ambulatory Visit (INDEPENDENT_AMBULATORY_CARE_PROVIDER_SITE_OTHER): Payer: Medicare Other | Admitting: Cardiovascular Disease

## 2011-01-02 DIAGNOSIS — I10 Essential (primary) hypertension: Secondary | ICD-10-CM

## 2011-01-02 DIAGNOSIS — R0600 Dyspnea, unspecified: Secondary | ICD-10-CM

## 2011-01-02 DIAGNOSIS — E785 Hyperlipidemia, unspecified: Secondary | ICD-10-CM

## 2011-01-02 DIAGNOSIS — I6529 Occlusion and stenosis of unspecified carotid artery: Secondary | ICD-10-CM

## 2011-01-02 DIAGNOSIS — I4891 Unspecified atrial fibrillation: Secondary | ICD-10-CM

## 2011-01-02 NOTE — Assessment & Plan Note (Signed)
Shortness of breath secondary to underlying COPD, improved on inhalers. Recent negative stress test.

## 2011-01-02 NOTE — Assessment & Plan Note (Signed)
Due for a recheck of cholesterol.

## 2011-01-02 NOTE — Assessment & Plan Note (Signed)
Blood pressure is well controlled on today's visit. No changes made to the medications. 

## 2011-01-02 NOTE — Patient Instructions (Signed)
You are doing well. No medication changes were made.  Please call us if you have new issues that need to be addressed before your next appt.  The office will contact you for a follow up Appt. In 12 months

## 2011-01-02 NOTE — Progress Notes (Signed)
Patient ID: Jessica [name], female    DOB: Oct 23, 1929, 77 y.o.   MRN: [id number]  HPI Comments: Jessica [name] has a history of peripheral vascular disease, status post abdominal aortic aneurysm repair in 2009,  history of complete heart block status post pacemaker implant, herna repair in 2009 with post op atrial fib, HTN, prior TIA per the notes, carotid u/s showing mild carotid disease, previous pains in her  neck radiating to her arms bilaterally occurring at night and awakening her from sleep, relieved by repositioning herself in bed. She presents for routine follow up.  On her last clinic with Dr. Caryl Comes she had shortness of breath. Stress test was ordered and this showed no ischemia. She has felt better on Spiriva. She states that she has been doing well.  No chest pain  with ambulation. No lightheadedness, diaphoresis with activity.   She does report having chronic pain in her back   EKG shows Paced rhythm at rate of 60 beats per minute    Outpatient Encounter Prescriptions as of 01/02/2011  Medication Sig Dispense Refill  . albuterol (VENTOLIN HFA) 108 (90 BASE) MCG/ACT inhaler Inhale two puffs every 4-6 hours as needed  1 Inhaler  3  . atorvastatin (LIPITOR) 40 MG tablet Take 1 tablet (40 mg total) by mouth daily.  30 tablet  6  . lisinopril-hydrochlorothiazide (PRINZIDE,ZESTORETIC) 10-12.5 MG per tablet Take 0.5 tablets by mouth daily.  30 tablet  6  . metoprolol tartrate (LOPRESSOR) 25 MG tablet Take 1 tablet (25 mg total) by mouth 2 (two) times daily.  60 tablet  6  . PARoxetine (PAXIL) 10 MG tablet Take 0.5 tablets (5 mg total) by mouth daily.  30 tablet  6  . Simethicone (SB ANTI-GAS) 180 MG CAPS Take by mouth. Take one tablet by mouth as needed.       . warfarin (COUMADIN) 5 MG tablet Take 1 tablet (5 mg total) by mouth as directed.  100 tablet  3     Review of Systems  HENT: Negative.   Eyes: Negative.   Respiratory: Negative.   Cardiovascular: Negative.   Gastrointestinal:  Negative.   Musculoskeletal: Positive for back pain and gait problem.       Leg pain in the hamstrings bilaterally  Skin: Negative.   Hematological: Negative.   Psychiatric/Behavioral: Negative.   All other systems reviewed and are negative.    BP 122/82  Pulse 60  Ht 5\' 7"  (1.702 m)  Wt 152 lb 12 oz (69.287 kg)  BMI 23.92 kg/m2   Physical Exam  Nursing note and vitals reviewed. Constitutional: She is oriented to person, place, and time. She appears well-developed and well-nourished.  HENT:  Head: Normocephalic.  Nose: Nose normal.  Mouth/Throat: Oropharynx is clear and moist.  Eyes: Conjunctivae are normal. Pupils are equal, round, and reactive to light.  Neck: Normal range of motion. Neck supple. No JVD present. Carotid bruit is present.  Cardiovascular: Normal rate, regular rhythm, S1 normal, S2 normal and intact distal pulses.  Exam reveals no gallop and no friction rub.   Murmur heard.  Crescendo systolic murmur is present with a grade of 2/6  Pulmonary/Chest: Effort normal and breath sounds normal. No respiratory distress. She has no wheezes. She has no rales. She exhibits no tenderness.  Abdominal: Soft. Bowel sounds are normal. She exhibits no distension. There is no tenderness.  Musculoskeletal: Normal range of motion. She exhibits no edema and no tenderness.  Lymphadenopathy:    She has no  cervical adenopathy.  Neurological: She is alert and oriented to person, place, and time. Coordination normal.  Skin: Skin is warm and dry. No rash noted. No erythema.  Psychiatric: She has a normal mood and affect. Her behavior is normal. Judgment and thought content normal.         Assessment and Plan

## 2011-01-02 NOTE — Assessment & Plan Note (Signed)
Mild disease by recent carotid ultrasound. Continue aggressive cholesterol management.

## 2011-01-03 ENCOUNTER — Other Ambulatory Visit: Payer: Self-pay | Admitting: *Deleted

## 2011-01-03 MED ORDER — TIOTROPIUM BROMIDE MONOHYDRATE 18 MCG IN CAPS: 18.0000 ug | ORAL_CAPSULE | Freq: Every day | RESPIRATORY_TRACT | Status: DC

## 2011-01-03 NOTE — Telephone Encounter (Signed)
This was not originally on the meds list, added for refill request.

## 2011-01-18 ENCOUNTER — Telehealth: Payer: Self-pay

## 2011-01-18 MED ORDER — METOPROLOL TARTRATE 25 MG PO TABS: 25.0000 mg | ORAL_TABLET | Freq: Two times a day (BID) | ORAL | Status: DC

## 2011-01-18 NOTE — Telephone Encounter (Signed)
Refill sent for metoprolol tart 25 mg bid

## 2011-01-24 ENCOUNTER — Ambulatory Visit (INDEPENDENT_AMBULATORY_CARE_PROVIDER_SITE_OTHER): Payer: Medicare Other | Admitting: Emergency Medicine

## 2011-01-24 DIAGNOSIS — Z7901 Long term (current) use of anticoagulants: Secondary | ICD-10-CM

## 2011-01-24 DIAGNOSIS — I4891 Unspecified atrial fibrillation: Secondary | ICD-10-CM

## 2011-01-24 LAB — POCT INR: INR: 2.6

## 2011-02-21 ENCOUNTER — Ambulatory Visit (INDEPENDENT_AMBULATORY_CARE_PROVIDER_SITE_OTHER): Payer: Medicare Other | Admitting: Emergency Medicine

## 2011-02-21 DIAGNOSIS — Z7901 Long term (current) use of anticoagulants: Secondary | ICD-10-CM

## 2011-02-21 DIAGNOSIS — I4891 Unspecified atrial fibrillation: Secondary | ICD-10-CM

## 2011-02-21 LAB — POCT INR: INR: 2

## 2011-02-21 MED ORDER — WARFARIN SODIUM 5 MG PO TABS: 5.0000 mg | ORAL_TABLET | ORAL | Status: DC

## 2011-02-22 ENCOUNTER — Ambulatory Visit (INDEPENDENT_AMBULATORY_CARE_PROVIDER_SITE_OTHER): Payer: 59 | Admitting: Internal Medicine

## 2011-02-22 ENCOUNTER — Encounter: Payer: Self-pay | Admitting: Internal Medicine

## 2011-02-22 DIAGNOSIS — E785 Hyperlipidemia, unspecified: Secondary | ICD-10-CM | POA: Insufficient documentation

## 2011-02-22 DIAGNOSIS — F32A Depression, unspecified: Secondary | ICD-10-CM | POA: Insufficient documentation

## 2011-02-22 DIAGNOSIS — F329 Major depressive disorder, single episode, unspecified: Secondary | ICD-10-CM

## 2011-02-22 LAB — LIPID PANEL
Cholesterol: 282 mg/dL — ABNORMAL HIGH (ref 0–200)
HDL: 46.5 mg/dL (ref >39.00)
Total CHOL/HDL Ratio: 6
Triglycerides: 254 mg/dL — ABNORMAL HIGH (ref 0.0–149.0)
VLDL: 50.8 mg/dL — ABNORMAL HIGH (ref 0.0–40.0)

## 2011-02-22 LAB — COMPREHENSIVE METABOLIC PANEL
ALT: 18 U/L (ref 0–35)
AST: 18 U/L (ref 0–37)
Albumin: 4 g/dL (ref 3.5–5.2)
Alkaline Phosphatase: 82 U/L (ref 39–117)
BUN: 25 mg/dL — ABNORMAL HIGH (ref 6–23)
CO2: 29 mEq/L (ref 19–32)
Calcium: 9.7 mg/dL (ref 8.4–10.5)
Chloride: 106 mEq/L (ref 96–112)
Creatinine, Ser: 1.4 mg/dL — ABNORMAL HIGH (ref 0.4–1.2)
GFR: 37.54 mL/min — ABNORMAL LOW (ref >60.00)
Glucose, Bld: 90 mg/dL (ref 70–99)
Potassium: 4.9 mEq/L (ref 3.5–5.1)
Sodium: 142 mEq/L (ref 135–145)
Total Bilirubin: 0.4 mg/dL (ref 0.3–1.2)
Total Protein: 6.9 g/dL (ref 6.0–8.3)

## 2011-02-22 MED ORDER — PAROXETINE HCL 10 MG PO TABS: 5.0000 mg | ORAL_TABLET | Freq: Every day | ORAL | Status: DC

## 2011-02-22 MED ORDER — ATORVASTATIN CALCIUM 40 MG PO TABS: 40.0000 mg | ORAL_TABLET | Freq: Every day | ORAL | Status: DC

## 2011-02-22 NOTE — Assessment & Plan Note (Signed)
Will check lipid profile and LFTs with labs today. Continue Lipitor. Followup in 6 months.

## 2011-02-22 NOTE — Assessment & Plan Note (Signed)
Symptoms currently fairly well controlled with use of Paxil. We'll plan to continue. Followup in 6 months.

## 2011-02-22 NOTE — Progress Notes (Signed)
Subjective:    Patient ID: Jessica [name], female    DOB: 1929-08-05, 78 y.o.   MRN: [id number]  HPI 78 year old female with history of hypertension, hyperlipidemia, depression, CHB s/p pacemaker, AAA s/p repair presents to establish care. She reports that she is generally doing well. In regards to her depression, she notes that the most difficult thing has been dealing with the death of her son. Her son was a Engineer, structural and was killed in a car accident. She reports that she underwent counseling after his death but continues to have some difficulty coping with this. She reports some improvement with the use of Paxil. She does not wish to change the dose of this medication or try a new medication at this time.  In regards to her hyperlipidemia, she is interested in having her cholesterol rechecked today. She reports full compliance with her medicine. She denies any myalgia associated with use of statin drug.  Outpatient Encounter Prescriptions as of 02/22/2011  Medication Sig Dispense Refill  . albuterol (VENTOLIN HFA) 108 (90 BASE) MCG/ACT inhaler Inhale two puffs every 4-6 hours as needed  1 Inhaler  3  . atorvastatin (LIPITOR) 40 MG tablet Take 1 tablet (40 mg total) by mouth daily.  90 tablet  1  . lisinopril-hydrochlorothiazide (PRINZIDE,ZESTORETIC) 10-12.5 MG per tablet Take 0.5 tablets by mouth daily.  30 tablet  6  . metoprolol tartrate (LOPRESSOR) 25 MG tablet Take 1 tablet (25 mg total) by mouth 2 (two) times daily.  60 tablet  6  . PARoxetine (PAXIL) 10 MG tablet Take 0.5 tablets (5 mg total) by mouth daily.  90 tablet  1  . Simethicone (SB ANTI-GAS) 180 MG CAPS Take by mouth. Take one tablet by mouth as needed.       . tiotropium (SPIRIVA HANDIHALER) 18 MCG inhalation capsule Place 1 capsule (18 mcg total) into inhaler and inhale daily.  30 capsule  2  . warfarin (COUMADIN) 5 MG tablet Take 1 tablet (5 mg total) by mouth as directed.  100 tablet  1    Review of Systems    Constitutional: Negative for fever, chills, appetite change, fatigue and unexpected weight change.  HENT: Negative for ear pain, congestion, sore throat, trouble swallowing, neck pain, voice change and sinus pressure.   Eyes: Negative for visual disturbance.  Respiratory: Negative for cough, shortness of breath, wheezing and stridor.   Cardiovascular: Negative for chest pain, palpitations and leg swelling.  Gastrointestinal: Negative for nausea, vomiting, abdominal pain, diarrhea, constipation, blood in stool, abdominal distention and anal bleeding.  Genitourinary: Negative for dysuria and flank pain.  Musculoskeletal: Negative for myalgias, arthralgias and gait problem.  Skin: Negative for color change and rash.  Neurological: Negative for dizziness and headaches.  Hematological: Negative for adenopathy. Does not bruise/bleed easily.  Psychiatric/Behavioral: Positive for dysphoric mood. Negative for suicidal ideas and sleep disturbance. The patient is not nervous/anxious.    BP 122/72  Pulse 93  Temp(Src) 97.4 F (36.3 C) (Oral)  Ht 5' 4.5" (1.638 m)  Wt 153 lb (69.4 kg)  BMI 25.86 kg/m2  SpO2 93%     Objective:   Physical Exam  Constitutional: She is oriented to person, place, and time. She appears well-developed and well-nourished. No distress.  HENT:  Head: Normocephalic and atraumatic.  Right Ear: External ear normal.  Left Ear: External ear normal.  Nose: Nose normal.  Mouth/Throat: Oropharynx is clear and moist. No oropharyngeal exudate.  Eyes: Conjunctivae are normal. Pupils are equal, round, and  reactive to light. Right eye exhibits no discharge. Left eye exhibits no discharge. No scleral icterus.  Neck: Normal range of motion. Neck supple. No tracheal deviation present. No thyromegaly present.  Cardiovascular: Normal rate, regular rhythm and intact distal pulses.  Exam reveals no gallop and no friction rub.   Murmur heard. Pulmonary/Chest: Effort normal and breath  sounds normal. No respiratory distress. She has no wheezes. She has no rales. She exhibits no tenderness.  Abdominal: Soft. Bowel sounds are normal. She exhibits no distension. There is no tenderness. There is no rebound and no guarding.  Musculoskeletal: Normal range of motion. She exhibits no edema and no tenderness.  Lymphadenopathy:    She has no cervical adenopathy.  Neurological: She is alert and oriented to person, place, and time. No cranial nerve deficit. She exhibits normal muscle tone. Coordination normal.  Skin: Skin is warm and dry. No rash noted. She is not diaphoretic. No erythema. No pallor.  Psychiatric: She has a normal mood and affect. Her behavior is normal. Judgment and thought content normal.          Assessment & Plan:

## 2011-02-23 LAB — LDL CHOLESTEROL, DIRECT: Direct LDL: 186.2 mg/dL

## 2011-04-04 ENCOUNTER — Ambulatory Visit (INDEPENDENT_AMBULATORY_CARE_PROVIDER_SITE_OTHER): Payer: 59

## 2011-04-04 DIAGNOSIS — I4891 Unspecified atrial fibrillation: Secondary | ICD-10-CM

## 2011-04-04 DIAGNOSIS — Z7901 Long term (current) use of anticoagulants: Secondary | ICD-10-CM

## 2011-04-04 LAB — POCT INR: INR: 3

## 2011-05-16 ENCOUNTER — Ambulatory Visit (INDEPENDENT_AMBULATORY_CARE_PROVIDER_SITE_OTHER): Payer: 59

## 2011-05-16 DIAGNOSIS — Z7901 Long term (current) use of anticoagulants: Secondary | ICD-10-CM

## 2011-05-16 DIAGNOSIS — I4891 Unspecified atrial fibrillation: Secondary | ICD-10-CM

## 2011-05-16 LAB — POCT INR: INR: 3.5

## 2011-06-13 ENCOUNTER — Ambulatory Visit (INDEPENDENT_AMBULATORY_CARE_PROVIDER_SITE_OTHER): Payer: 59

## 2011-06-13 DIAGNOSIS — Z7901 Long term (current) use of anticoagulants: Secondary | ICD-10-CM

## 2011-06-13 DIAGNOSIS — I4891 Unspecified atrial fibrillation: Secondary | ICD-10-CM

## 2011-06-13 LAB — POCT INR: INR: 2.6

## 2011-06-25 ENCOUNTER — Other Ambulatory Visit: Payer: Self-pay | Admitting: *Deleted

## 2011-06-25 MED ORDER — LISINOPRIL-HYDROCHLOROTHIAZIDE 10-12.5 MG PO TABS: 0.5000 | ORAL_TABLET | Freq: Every day | ORAL | Status: DC

## 2011-06-25 NOTE — Telephone Encounter (Signed)
Refilled Lisinopril. 

## 2011-06-27 ENCOUNTER — Encounter: Payer: Self-pay | Admitting: *Deleted

## 2011-07-05 ENCOUNTER — Ambulatory Visit (INDEPENDENT_AMBULATORY_CARE_PROVIDER_SITE_OTHER): Payer: Medicare Other | Admitting: *Deleted

## 2011-07-05 ENCOUNTER — Encounter: Payer: Self-pay | Admitting: Internal Medicine

## 2011-07-05 DIAGNOSIS — I442 Atrioventricular block, complete: Secondary | ICD-10-CM

## 2011-07-05 LAB — PACEMAKER DEVICE OBSERVATION
AL AMPLITUDE: 2.1 mv
AL IMPEDENCE PM: 429 Ohm
AL THRESHOLD: 0.5 V
BAMS-0001: 150 {beats}/min
BAMS-0003: 70 {beats}/min
BATTERY VOLTAGE: 2.79 V
DEVICE MODEL PM: 1236137
RV LEAD AMPLITUDE: 5.7 mv
RV LEAD IMPEDENCE PM: 505 Ohm
RV LEAD THRESHOLD: 0.625 V

## 2011-07-05 NOTE — Progress Notes (Signed)
PPM check 

## 2011-07-11 ENCOUNTER — Ambulatory Visit (INDEPENDENT_AMBULATORY_CARE_PROVIDER_SITE_OTHER): Payer: Medicare Other

## 2011-07-11 DIAGNOSIS — Z7901 Long term (current) use of anticoagulants: Secondary | ICD-10-CM

## 2011-07-11 DIAGNOSIS — I4891 Unspecified atrial fibrillation: Secondary | ICD-10-CM

## 2011-07-11 LAB — POCT INR: INR: 3.2

## 2011-08-08 ENCOUNTER — Ambulatory Visit (INDEPENDENT_AMBULATORY_CARE_PROVIDER_SITE_OTHER): Payer: Medicare Other

## 2011-08-08 DIAGNOSIS — I4891 Unspecified atrial fibrillation: Secondary | ICD-10-CM

## 2011-08-08 DIAGNOSIS — Z7901 Long term (current) use of anticoagulants: Secondary | ICD-10-CM

## 2011-08-08 LAB — POCT INR: INR: 2.7

## 2011-09-05 ENCOUNTER — Ambulatory Visit (INDEPENDENT_AMBULATORY_CARE_PROVIDER_SITE_OTHER): Payer: Medicare Other

## 2011-09-05 DIAGNOSIS — I4891 Unspecified atrial fibrillation: Secondary | ICD-10-CM

## 2011-09-05 DIAGNOSIS — Z7901 Long term (current) use of anticoagulants: Secondary | ICD-10-CM

## 2011-09-05 LAB — POCT INR: INR: 2.2

## 2011-09-27 ENCOUNTER — Ambulatory Visit (INDEPENDENT_AMBULATORY_CARE_PROVIDER_SITE_OTHER): Payer: Medicare Other | Admitting: Pulmonary Disease

## 2011-09-27 ENCOUNTER — Encounter: Payer: Self-pay | Admitting: Pulmonary Disease

## 2011-09-27 VITALS — BP 140/78 | HR 60 | Temp 97.9°F | Ht 67.0 in | Wt 153.8 lb

## 2011-09-27 DIAGNOSIS — J449 Chronic obstructive pulmonary disease, unspecified: Secondary | ICD-10-CM

## 2011-09-27 DIAGNOSIS — J441 Chronic obstructive pulmonary disease with (acute) exacerbation: Secondary | ICD-10-CM | POA: Insufficient documentation

## 2011-09-27 DIAGNOSIS — Z23 Encounter for immunization: Secondary | ICD-10-CM

## 2011-09-27 DIAGNOSIS — R0602 Shortness of breath: Secondary | ICD-10-CM

## 2011-09-27 MED ORDER — BUDESONIDE-FORMOTEROL FUMARATE 80-4.5 MCG/ACT IN AERO: 2.0000 | INHALATION_SPRAY | Freq: Two times a day (BID) | RESPIRATORY_TRACT | Status: DC

## 2011-09-27 NOTE — Patient Instructions (Addendum)
Stop spiriva Start symbicort 2 puffs twice a day Start pulmonary rehab We will see you back in 3 months or sooner if needed

## 2011-09-27 NOTE — Progress Notes (Signed)
Subjective:    Patient ID: Jessica [name], female    DOB: May 19, 1929, 78 y.o.   MRN: [id number]  HPI  Ms. Hertzberg is a very pleasant 78 year old female who came to our clinic today for further evaluation of COPD. Show normal childhood without respiratory illnesses but unfortunately started smoking at a young age. She smoked one half to 2 packs of cigarettes daily for 50 years and quit 15 years ago. About 4 years or so ago she started to develop shortness of breath and was diagnosed with COPD her primary care physician. In general she says that since her AAA repair 3 years ago she has gained weight and become more out of shape. She still stays active and wants to participate in pulmonary rehabilitation but came to [place] today actually because she needed referral for that. She's been using Spiriva for several years but states that she gets no benefit from it. She stopped using it about a month ago because she said after long-term use she didn't see more benefit from it. In terms of the severity of her shortness of breath she says that she can make it through the grocery store but she has to lean on the cart. She cannot run a vacuum cleaner because it makes her short of breath. She has a cough daily that's productive of clear to yellow sputum. She does have some chronic sinus symptoms in that she has a constant clear runny nose. She has recently seen an allergist for this. She denies acid reflux symptoms. 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 She states that one time when she was young she accidentally mixed ammonia and bleach while cleaning her home but she states that she had no lasting ill effect from this.   Past Medical History  Diagnosis Date  . Atrioventricular block, complete   . Cardiac pacemaker in situ   . Abdominal aneurysm without mention of rupture   . Hernia   . AAA (abdominal aortic  aneurysm)   . Hypertension   . Hyperlipidemia      Family History  Problem Relation Age of Onset  . Heart disease Father   . Alcohol abuse Father   . Cancer Brother      History   Social History  . Marital Status: Widowed    Spouse Name: N/A    Number of Children: 2  . Years of Education: N/A   Occupational History  . Retired    Social History Main Topics  . Smoking status: Former Smoker -- 1.5 packs/day for 50 years    Types: Cigarettes    Quit date: 10/03/1995  . Smokeless tobacco: Never Used  . Alcohol Use: Yes     occasionally  . Drug Use: No  . Sexually Active: Not on file   Other Topics Concern  . Not on file   Social History Narrative   Born in [place]. Lives in [place]. Son lives nearby.No regular exercise      Allergies  Allergen Reactions  . Codeine     Made her feel crazy  . Morphine     Made her feel crazy     Outpatient Prescriptions Prior to Visit  Medication Sig Dispense Refill  . atorvastatin (LIPITOR) 40 MG tablet Take 1 tablet (40 mg total) by mouth daily.  90 tablet  1  . metoprolol tartrate (LOPRESSOR) 25 MG tablet  Take 1 tablet (25 mg total) by mouth 2 (two) times daily.  60 tablet  6  . warfarin (COUMADIN) 5 MG tablet Take 1 tablet (5 mg total) by mouth as directed.  100 tablet  1  . albuterol (VENTOLIN HFA) 108 (90 BASE) MCG/ACT inhaler Inhale two puffs every 4-6 hours as needed  1 Inhaler  3  . lisinopril-hydrochlorothiazide (PRINZIDE,ZESTORETIC) 10-12.5 MG per tablet Take 0.5 tablets by mouth daily.  30 tablet  6  . PARoxetine (PAXIL) 10 MG tablet Take 10 mg by mouth daily.      . Simethicone (SB ANTI-GAS) 180 MG CAPS Take by mouth. Take one tablet by mouth as needed.       . tiotropium (SPIRIVA HANDIHALER) 18 MCG inhalation capsule Place 1 capsule (18 mcg total) into inhaler and inhale daily.  30 capsule  2      Review of Systems  Constitutional: Negative for fever, chills and unexpected weight change.  HENT: Negative for ear  pain, nosebleeds, congestion, sore throat, rhinorrhea, sneezing, trouble swallowing, dental problem, voice change, postnasal drip and sinus pressure.   Eyes: Negative for visual disturbance.  Respiratory: Positive for cough and shortness of breath. Negative for choking.   Cardiovascular: Negative for chest pain and leg swelling.  Gastrointestinal: Negative for vomiting, abdominal pain and diarrhea.  Genitourinary: Negative for difficulty urinating.  Musculoskeletal: Negative for arthralgias.  Skin: Negative for rash.  Neurological: Negative for tremors, syncope and headaches.  Hematological: Does not bruise/bleed easily.       Objective:   Physical Exam Filed Vitals:   09/27/11 1532  BP: 140/78  Pulse: 60  Temp: 97.9 F (36.6 C)  TempSrc: Oral  Height: 5\' 7"  (1.702 m)  Weight: 153 lb 12.8 oz (69.763 kg)  SpO2: 97%   Gen: well appearing, no acute distress HEENT: NCAT, PERRL, EOMi, OP clear, neck supple without masses PULM: CTA B CV: RRR, no mgr, no JVD AB: BS+, soft, nontender, no hsm Ext: warm, no edema, no clubbing, no cyanosis Derm: no rash or skin breakdown Neuro: A&Ox4, CN II-XII intact, strength 5/5 in all 4 extremities  09/27/2011 simple spirometry ratio 53%, FEV1 0.97 L (44% predicted) 09/27/2011 MMRC score 1     Assessment & Plan:   COPD (chronic obstructive pulmonary disease) COPD: GOLD Grade C Combined recommendations from the [place], [employer] of Chest Physicians, Investment banker, corporate, European Respiratory Society (Qaseem A et al, Ann Intern Med. 2011;155(3):179) recommends tobacco cessation, pulmonary rehab (for symptomatic patients with an FEV1 < 50% predicted), supplemental oxygen (for patients with SaO2 <88% or paO2 <55), and appropriate bronchodilator therapy.  In regards to long acting bronchodilators, they recommend monotherapy (FEV1 60-80% with symptoms weak evidence, FEV1 with symptoms <60% strong evidence), or  combination therapy (FEV1 <60% with symptoms, strong recommendation, moderate evidence).  One should also provide patients with annual immunizations and consider therapy for prevention of COPD exacerbations (ie. roflumilast or azithromycin) when appopriate.  -O2 therapy: not indicated -Immunizations: given flu shot today -Tobacco use: quit 12 years ago -Exercise: to enroll in pulm rehab at [facility] -Bronchodilator therapy: Change from spiriva to Symbicort; Albuterol doesn't help -Exacerbation prevention: Symbicort, rehab    Updated Medication List Outpatient Encounter Prescriptions as of 09/27/2011  Medication Sig Dispense Refill  . atorvastatin (LIPITOR) 40 MG tablet Take 1 tablet (40 mg total) by mouth daily.  90 tablet  1  . metoprolol tartrate (LOPRESSOR) 25 MG tablet Take 1 tablet (25 mg total) by mouth  2 (two) times daily.  60 tablet  6  . warfarin (COUMADIN) 5 MG tablet Take 1 tablet (5 mg total) by mouth as directed.  100 tablet  1  . albuterol (VENTOLIN HFA) 108 (90 BASE) MCG/ACT inhaler Inhale two puffs every 4-6 hours as needed  1 Inhaler  3  . budesonide-formoterol (SYMBICORT) 80-4.5 MCG/ACT inhaler Inhale 2 puffs into the lungs 2 (two) times daily.  1 Inhaler  2  . DISCONTD: lisinopril-hydrochlorothiazide (PRINZIDE,ZESTORETIC) 10-12.5 MG per tablet Take 0.5 tablets by mouth daily.  30 tablet  6  . DISCONTD: PARoxetine (PAXIL) 10 MG tablet Take 10 mg by mouth daily.      Marland Kitchen DISCONTD: Simethicone (SB ANTI-GAS) 180 MG CAPS Take by mouth. Take one tablet by mouth as needed.       Marland Kitchen DISCONTD: tiotropium (SPIRIVA HANDIHALER) 18 MCG inhalation capsule Place 1 capsule (18 mcg total) into inhaler and inhale daily.  30 capsule  2

## 2011-09-27 NOTE — Assessment & Plan Note (Signed)
COPD: GOLD Grade C Combined recommendations from the [employer], [employer], Investment banker, corporate, [place] (Qaseem A et al, Ann Intern Med. 2011;155(3):179) recommends tobacco cessation, pulmonary rehab (for symptomatic patients with an FEV1 < 50% predicted), supplemental oxygen (for patients with SaO2 <88% or paO2 <55), and appropriate bronchodilator therapy.  In regards to long acting bronchodilators, they recommend monotherapy (FEV1 60-80% with symptoms weak evidence, FEV1 with symptoms <60% strong evidence), or combination therapy (FEV1 <60% with symptoms, strong recommendation, moderate evidence).  One should also provide patients with annual immunizations and consider therapy for prevention of COPD exacerbations (ie. roflumilast or azithromycin) when appopriate.  -O2 therapy: not indicated -Immunizations: given flu shot today -Tobacco use: quit 12 years ago -Exercise: to enroll in pulm rehab at [facility] -Bronchodilator therapy: Change from spiriva to Symbicort; Albuterol doesn't help -Exacerbation prevention: Symbicort, rehab

## 2011-10-01 ENCOUNTER — Other Ambulatory Visit: Payer: Self-pay | Admitting: *Deleted

## 2011-10-01 MED ORDER — METOPROLOL TARTRATE 25 MG PO TABS: 25.0000 mg | ORAL_TABLET | Freq: Two times a day (BID) | ORAL | Status: DC

## 2011-10-01 NOTE — Telephone Encounter (Signed)
Refilled Metoprolol

## 2011-10-02 ENCOUNTER — Other Ambulatory Visit: Payer: Self-pay

## 2011-10-02 ENCOUNTER — Other Ambulatory Visit: Payer: Self-pay | Admitting: *Deleted

## 2011-10-02 DIAGNOSIS — E785 Hyperlipidemia, unspecified: Secondary | ICD-10-CM

## 2011-10-02 MED ORDER — ATORVASTATIN CALCIUM 40 MG PO TABS: 40.0000 mg | ORAL_TABLET | Freq: Every day | ORAL | Status: DC

## 2011-10-02 MED ORDER — METOPROLOL TARTRATE 25 MG PO TABS: 25.0000 mg | ORAL_TABLET | Freq: Two times a day (BID) | ORAL | Status: DC

## 2011-10-02 NOTE — Telephone Encounter (Signed)
Refill sent for metoprolol tart 25 mg one tablet twice a day.

## 2011-10-03 ENCOUNTER — Ambulatory Visit (INDEPENDENT_AMBULATORY_CARE_PROVIDER_SITE_OTHER): Payer: Medicare Other

## 2011-10-03 DIAGNOSIS — Z7901 Long term (current) use of anticoagulants: Secondary | ICD-10-CM

## 2011-10-03 DIAGNOSIS — I4891 Unspecified atrial fibrillation: Secondary | ICD-10-CM

## 2011-10-03 LAB — POCT INR: INR: 3.2

## 2011-10-09 ENCOUNTER — Telehealth: Payer: Self-pay | Admitting: Pulmonary Disease

## 2011-10-09 DIAGNOSIS — J449 Chronic obstructive pulmonary disease, unspecified: Secondary | ICD-10-CM

## 2011-10-09 DIAGNOSIS — R0600 Dyspnea, unspecified: Secondary | ICD-10-CM

## 2011-10-09 NOTE — Telephone Encounter (Signed)
Per BQ last OV note he did want pt to be set up with RP over at [facility]: O2 therapy: not indicated  -Immunizations: given flu shot today  -Tobacco use: quit 12 years ago  -Exercise: to enroll in pulm rehab at [facility]  -Bronchodilator therapy: Change from spiriva to Symbicort; Albuterol doesn't help  -Exacerbation prevention: Symbicort, rehab   I have sent order to pcc's for this. Please advise PCC's thanks

## 2011-10-09 NOTE — Telephone Encounter (Signed)
Referral faxed to [facility] pul rehab Jessica [name]

## 2011-10-31 ENCOUNTER — Ambulatory Visit (INDEPENDENT_AMBULATORY_CARE_PROVIDER_SITE_OTHER): Payer: Medicare Other

## 2011-10-31 DIAGNOSIS — I4891 Unspecified atrial fibrillation: Secondary | ICD-10-CM

## 2011-10-31 DIAGNOSIS — Z7901 Long term (current) use of anticoagulants: Secondary | ICD-10-CM

## 2011-10-31 LAB — POCT INR: INR: 2.7

## 2011-11-02 ENCOUNTER — Ambulatory Visit (INDEPENDENT_AMBULATORY_CARE_PROVIDER_SITE_OTHER): Payer: Medicare Other | Admitting: Internal Medicine

## 2011-11-02 ENCOUNTER — Encounter: Payer: Self-pay | Admitting: Internal Medicine

## 2011-11-02 VITALS — BP 138/78 | HR 84 | Ht 67.0 in | Wt 152.8 lb

## 2011-11-02 DIAGNOSIS — R5383 Other fatigue: Secondary | ICD-10-CM | POA: Insufficient documentation

## 2011-11-02 DIAGNOSIS — I4891 Unspecified atrial fibrillation: Secondary | ICD-10-CM

## 2011-11-02 DIAGNOSIS — I10 Essential (primary) hypertension: Secondary | ICD-10-CM

## 2011-11-02 DIAGNOSIS — I442 Atrioventricular block, complete: Secondary | ICD-10-CM

## 2011-11-02 DIAGNOSIS — R5381 Other malaise: Secondary | ICD-10-CM

## 2011-11-02 LAB — PACEMAKER DEVICE OBSERVATION
AL AMPLITUDE: 0.8 mv
AL IMPEDENCE PM: 368 Ohm
AL THRESHOLD: 0.5 V
ATRIAL PACING PM: 65
BAMS-0001: 150 {beats}/min
BAMS-0003: 70 {beats}/min
BATTERY VOLTAGE: 2.8 V
DEVICE MODEL PM: 1236137
RV LEAD AMPLITUDE: 4.4 mv
RV LEAD IMPEDENCE PM: 468 Ohm
RV LEAD THRESHOLD: 0.625 V
VENTRICULAR PACING PM: 97

## 2011-11-02 MED ORDER — ATENOLOL 25 MG PO TABS: 25.0000 mg | ORAL_TABLET | Freq: Every day | ORAL | Status: DC

## 2011-11-02 NOTE — Patient Instructions (Addendum)
Start on Atenolol 25 mg take one tablet daily.  Follow up with Dr. Caryl Comes in June 2014.

## 2011-11-02 NOTE — Assessment & Plan Note (Signed)
Stable post paciang

## 2011-11-02 NOTE — Assessment & Plan Note (Signed)
The patient's device was interrogated.  The information was reviewed. No changes were made in the programming.    

## 2011-11-02 NOTE — Progress Notes (Signed)
Patient Care Team: Jackolyn Confer, MD as PCP - General (Internal Medicine) Deboraha Sprang, MD (Cardiology) Minna Merritts, MD (Cardiology)   HPI  Jessica [name] is a 78 y.o. female seen in followup for a pacemaker implanted for complete heart block now status post device generator replacement  Her exercise tolerance improved following pacemaker implantation; however, she continues to complain of dyspnea with exertion. We undertook stress testing with Myoview last fall. This was negative for ischemia  Catheterization is in the remote past and stress testing apparently is in the quite distant past.  She notes her heart rates have been in the 100s sometimes when she is seeing other physicians  She has had atrial fibrillation and is on Coumadin. She has a CHADS VASC score of 6  htn, gender, agex2, vascular disease, prior TIA   Breathing is much better since she saw Dr Lake Bells  Otherwise without chest pain edema or other complaints Except fatigue which attributes to her betablocker and has prompted her to take it once instead of twice daily     Past Medical History  Diagnosis Date  . Atrioventricular block, complete   . Cardiac pacemaker in situ   . Abdominal aneurysm without mention of rupture   . Hernia   . AAA (abdominal aortic aneurysm)   . Hypertension   . Hyperlipidemia     Past Surgical History  Procedure Date  . Ohter     growth on colon surgery  . Tonsillectomy   . Abdominal aortic aneurysm repair   . Hernia repair     Current Outpatient Prescriptions  Medication Sig Dispense Refill  . albuterol (VENTOLIN HFA) 108 (90 BASE) MCG/ACT inhaler Inhale two puffs every 4-6 hours as needed  1 Inhaler  3  . atorvastatin (LIPITOR) 40 MG tablet Take 1 tablet (40 mg total) by mouth daily.  90 tablet  1  . budesonide-formoterol (SYMBICORT) 80-4.5 MCG/ACT inhaler Inhale 2 puffs into the lungs 2 (two) times daily.  1 Inhaler  2  . metoprolol tartrate (LOPRESSOR) 25 MG  tablet Take 25 mg by mouth daily.      Marland Kitchen warfarin (COUMADIN) 5 MG tablet Take 1 tablet (5 mg total) by mouth as directed.  100 tablet  1  . DISCONTD: metoprolol tartrate (LOPRESSOR) 25 MG tablet Take 1 tablet (25 mg total) by mouth 2 (two) times daily.  60 tablet  6    Allergies  Allergen Reactions  . Codeine     Made her feel crazy  . Morphine     Made her feel crazy    Review of Systems negative except from HPI and PMH  Physical Exam BP 138/78  Pulse 84  Ht 5\' 7"  (1.702 m)  Wt 152 lb 12 oz (69.287 kg)  BMI 23.92 kg/m2 Well developed and well nourished in no acute distress HENT normal E scleral and icterus clear Neck Supple JVP flat; carotids brisk and full Clear to ausculation The patient's device was interrogated.  The information was reviewed. No changes were made in the programming.   Regular rate and rhythm, no murmurs gallops or rub Soft with active bowel sounds No clubbing cyanosis none Edema Alert and oriented, grossly normal motor and sensory function Skin Warm and Dry    Assessment and  Plan

## 2011-11-02 NOTE — Assessment & Plan Note (Signed)
There could be multiple causes, but the neutrophilic beta blocker might be a long, particularly as she thinks that she does better taking it once a day to twice a day. I have discontinued and put her on atenolol 25. Dr. Rockey Situ can look at this when he sees her again in December

## 2011-11-02 NOTE — Assessment & Plan Note (Signed)
No significant intercurrent atrial fib

## 2011-11-28 ENCOUNTER — Ambulatory Visit (INDEPENDENT_AMBULATORY_CARE_PROVIDER_SITE_OTHER): Payer: Medicare Other

## 2011-11-28 DIAGNOSIS — Z7901 Long term (current) use of anticoagulants: Secondary | ICD-10-CM

## 2011-11-28 DIAGNOSIS — I4891 Unspecified atrial fibrillation: Secondary | ICD-10-CM

## 2011-11-28 LAB — POCT INR: INR: 1.6

## 2011-11-28 MED ORDER — WARFARIN SODIUM 5 MG PO TABS: ORAL_TABLET | ORAL | Status: DC

## 2011-12-12 ENCOUNTER — Ambulatory Visit (INDEPENDENT_AMBULATORY_CARE_PROVIDER_SITE_OTHER): Payer: Medicare Other

## 2011-12-12 DIAGNOSIS — Z7901 Long term (current) use of anticoagulants: Secondary | ICD-10-CM

## 2011-12-12 DIAGNOSIS — I4891 Unspecified atrial fibrillation: Secondary | ICD-10-CM

## 2011-12-12 LAB — POCT INR: INR: 2.2

## 2012-01-02 ENCOUNTER — Encounter: Payer: Self-pay | Admitting: Cardiovascular Disease

## 2012-01-02 ENCOUNTER — Ambulatory Visit (INDEPENDENT_AMBULATORY_CARE_PROVIDER_SITE_OTHER): Payer: Medicare Other

## 2012-01-02 ENCOUNTER — Ambulatory Visit (INDEPENDENT_AMBULATORY_CARE_PROVIDER_SITE_OTHER): Payer: Medicare Other | Admitting: Cardiovascular Disease

## 2012-01-02 VITALS — BP 132/72 | HR 120 | Ht 65.0 in | Wt 151.8 lb

## 2012-01-02 DIAGNOSIS — I4891 Unspecified atrial fibrillation: Secondary | ICD-10-CM

## 2012-01-02 DIAGNOSIS — I6529 Occlusion and stenosis of unspecified carotid artery: Secondary | ICD-10-CM

## 2012-01-02 DIAGNOSIS — J449 Chronic obstructive pulmonary disease, unspecified: Secondary | ICD-10-CM

## 2012-01-02 DIAGNOSIS — I739 Peripheral vascular disease, unspecified: Secondary | ICD-10-CM

## 2012-01-02 DIAGNOSIS — Z950 Presence of cardiac pacemaker: Secondary | ICD-10-CM

## 2012-01-02 DIAGNOSIS — E785 Hyperlipidemia, unspecified: Secondary | ICD-10-CM

## 2012-01-02 DIAGNOSIS — Z7901 Long term (current) use of anticoagulants: Secondary | ICD-10-CM

## 2012-01-02 DIAGNOSIS — I10 Essential (primary) hypertension: Secondary | ICD-10-CM

## 2012-01-02 LAB — POCT INR: INR: 2.6

## 2012-01-02 NOTE — Assessment & Plan Note (Signed)
Followed by Dr. Klein 

## 2012-01-02 NOTE — Assessment & Plan Note (Signed)
Blood pressure is well controlled on today's visit. No changes made to the medications. 

## 2012-01-02 NOTE — Assessment & Plan Note (Signed)
Last ultrasound in August 2011 showing mild heterogeneous plaque less than 39% bilaterally she does have a bruit on the left. We'll order repeat ultrasound for 2014.

## 2012-01-02 NOTE — Progress Notes (Signed)
Patient ID: Jessica [name], female    DOB: [birth year], 78 y.o.   MRN: [id number]  HPI Comments: Jessica [name] has a history of peripheral vascular disease, status post abdominal aortic aneurysm repair in 2009,  long smoking history who stopped 15 years ago , history of complete heart block status post pacemaker implant, herna repair in 2009 with post op atrial fib, HTN, prior TIA per the notes, carotid u/s showing mild carotid disease in 2011 with atypical vessel on the left contributing to her bruit, previous pains in her  neck radiating to her arms bilaterally occurring at night and awakening her from sleep, relieved by repositioning herself in bed. She presents for routine follow up.  She has COPD, 50 years of smoking. She was previously on simvastatin and this was changed to Lipitor 40 mg daily for hyperlipidemia. No lab work since that time. She has significant cramping in her legs at nighttime at least every other night causing her to get out of bed and walk.   She was doing frequent exercise/walking but has not been doing as much in the cold. Shortness of breath has improved on Symbicort. Still with mild shortness of breath.    No chest pain  with ambulation. No lightheadedness, diaphoresis with activity.   She does report having chronic pain in her back  Last Myoview October 2012 showing no ischemia   EKG shows Paced rhythm at rate of 60 beats per minute    Outpatient Encounter Prescriptions as of 01/02/2012  Medication Sig Dispense Refill  . albuterol (VENTOLIN HFA) 108 (90 BASE) MCG/ACT inhaler Inhale two puffs every 4-6 hours as needed  1 Inhaler  3  . atenolol (TENORMIN) 25 MG tablet Take 1 tablet (25 mg total) by mouth daily.  180 tablet  3  . atorvastatin (LIPITOR) 40 MG tablet Take 1 tablet (40 mg total) by mouth daily.  90 tablet  1  . budesonide-formoterol (SYMBICORT) 80-4.5 MCG/ACT inhaler Inhale 2 puffs into the lungs 2 (two) times daily.  1 Inhaler  2  . warfarin (COUMADIN) 5  MG tablet Take as directed by anticoagulation clinic  100 tablet  1     Review of Systems  HENT: Negative.   Eyes: Negative.   Respiratory: Positive for shortness of breath.   Cardiovascular: Negative.   Gastrointestinal: Negative.   Musculoskeletal: Positive for back pain and gait problem.       Leg cramping at night  Skin: Negative.   Hematological: Negative.   Psychiatric/Behavioral: Negative.   All other systems reviewed and are negative.    BP 132/72  Pulse 120  Ht 5\' 5"  (1.651 m)  Wt 151 lb 12 oz (68.833 kg)  BMI 25.25 kg/m2  Physical Exam  Nursing note and vitals reviewed. Constitutional: She is oriented to person, place, and time. She appears well-developed and well-nourished.  HENT:  Head: Normocephalic.  Nose: Nose normal.  Mouth/Throat: Oropharynx is clear and moist.  Eyes: Conjunctivae normal are normal. Pupils are equal, round, and reactive to light.  Neck: Normal range of motion. Neck supple. No JVD present. Carotid bruit is present.  Cardiovascular: Normal rate, regular rhythm, S1 normal, S2 normal and intact distal pulses.  Exam reveals no gallop and no friction rub.   Murmur heard.  Crescendo systolic murmur is present with a grade of 2/6  Pulmonary/Chest: Effort normal and breath sounds normal. No respiratory distress. She has no wheezes. She has no rales. She exhibits no tenderness.  Abdominal: Soft. Bowel sounds  are normal. She exhibits no distension. There is no tenderness.  Musculoskeletal: Normal range of motion. She exhibits no edema and no tenderness.  Lymphadenopathy:    She has no cervical adenopathy.  Neurological: She is alert and oriented to person, place, and time. Coordination normal.  Skin: Skin is warm and dry. No rash noted. No erythema.  Psychiatric: She has a normal mood and affect. Her behavior is normal. Judgment and thought content normal.         Assessment and Plan

## 2012-01-02 NOTE — Patient Instructions (Addendum)
You are doing well. We will check labs tomorrow  After labs are done,  hold the lipitor for a week to see if the cramps get better If cramps get better call the office.  We would try a different cholesterol medication If cramps continue without lipitor, we would continue the lipitor  Please call us if you have new issues that need to be addressed before your next appt.  Your physician wants you to follow-up in: 6 months.  You will receive a reminder letter in the mail two months in advance. If you don't receive a letter, please call our office to schedule the follow-up appointment.

## 2012-01-02 NOTE — Assessment & Plan Note (Signed)
Mild shortness of breath, stable. We have encouraged continued exercise.

## 2012-01-02 NOTE — Assessment & Plan Note (Signed)
She has significant lower extremity cramps. We have suggested she hold Lipitor for several weeks. If cramps improved, we will try an alternate statin such as Crestor. If no improvement by holding Lipitor, we would restart the Lipitor.

## 2012-01-03 ENCOUNTER — Ambulatory Visit (INDEPENDENT_AMBULATORY_CARE_PROVIDER_SITE_OTHER): Payer: Medicare Other

## 2012-01-03 DIAGNOSIS — I739 Peripheral vascular disease, unspecified: Secondary | ICD-10-CM

## 2012-01-03 DIAGNOSIS — E785 Hyperlipidemia, unspecified: Secondary | ICD-10-CM

## 2012-01-04 LAB — LIPID PANEL
Chol/HDL Ratio: 3 ratio units (ref 0.0–4.4)
Cholesterol, Total: 181 mg/dL (ref 100–199)
HDL: 61 mg/dL (ref >39)
LDL Calculated: 84 mg/dL (ref 0–99)
Triglycerides: 179 mg/dL — ABNORMAL HIGH (ref 0–149)
VLDL Cholesterol Cal: 36 mg/dL (ref 5–40)

## 2012-01-04 LAB — HEPATIC FUNCTION PANEL
ALT: 23 IU/L (ref 0–32)
AST: 20 IU/L (ref 0–40)
Albumin: 4.4 g/dL (ref 3.5–4.7)
Alkaline Phosphatase: 97 IU/L (ref 39–117)
Bilirubin, Direct: 0.14 mg/dL (ref 0.00–0.40)
Total Bilirubin: 0.6 mg/dL (ref 0.0–1.2)
Total Protein: 6.8 g/dL (ref 6.0–8.5)

## 2012-01-30 ENCOUNTER — Ambulatory Visit (INDEPENDENT_AMBULATORY_CARE_PROVIDER_SITE_OTHER): Payer: Medicare Other

## 2012-01-30 DIAGNOSIS — Z7901 Long term (current) use of anticoagulants: Secondary | ICD-10-CM

## 2012-01-30 DIAGNOSIS — I4891 Unspecified atrial fibrillation: Secondary | ICD-10-CM

## 2012-01-30 LAB — POCT INR: INR: 2.8

## 2012-02-18 ENCOUNTER — Other Ambulatory Visit: Payer: Self-pay

## 2012-02-18 MED ORDER — EZETIMIBE 10 MG PO TABS: 10.0000 mg | ORAL_TABLET | Freq: Every day | ORAL | Status: DC

## 2012-03-12 ENCOUNTER — Ambulatory Visit (INDEPENDENT_AMBULATORY_CARE_PROVIDER_SITE_OTHER): Payer: Medicare Other

## 2012-03-12 DIAGNOSIS — Z7901 Long term (current) use of anticoagulants: Secondary | ICD-10-CM

## 2012-03-12 DIAGNOSIS — I4891 Unspecified atrial fibrillation: Secondary | ICD-10-CM

## 2012-03-12 LAB — POCT INR: INR: 3.6

## 2012-04-01 ENCOUNTER — Other Ambulatory Visit: Payer: Self-pay | Admitting: *Deleted

## 2012-04-01 MED ORDER — ALBUTEROL SULFATE HFA 108 (90 BASE) MCG/ACT IN AERS: INHALATION_SPRAY | RESPIRATORY_TRACT | Status: DC

## 2012-04-01 NOTE — Telephone Encounter (Signed)
Eprescribed.

## 2012-04-03 ENCOUNTER — Other Ambulatory Visit: Payer: Self-pay | Admitting: Pulmonary Disease

## 2012-04-03 MED ORDER — BUDESONIDE-FORMOTEROL FUMARATE 80-4.5 MCG/ACT IN AERO: 2.0000 | INHALATION_SPRAY | Freq: Two times a day (BID) | RESPIRATORY_TRACT | Status: DC

## 2012-04-09 ENCOUNTER — Ambulatory Visit (INDEPENDENT_AMBULATORY_CARE_PROVIDER_SITE_OTHER): Payer: Medicare Other

## 2012-04-09 DIAGNOSIS — Z7901 Long term (current) use of anticoagulants: Secondary | ICD-10-CM

## 2012-04-09 DIAGNOSIS — I4891 Unspecified atrial fibrillation: Secondary | ICD-10-CM

## 2012-04-09 LAB — POCT INR: INR: 3.4

## 2012-04-14 ENCOUNTER — Ambulatory Visit (INDEPENDENT_AMBULATORY_CARE_PROVIDER_SITE_OTHER): Payer: Medicare Other | Admitting: Internal Medicine

## 2012-04-14 ENCOUNTER — Encounter: Payer: Self-pay | Admitting: Internal Medicine

## 2012-04-14 VITALS — BP 130/80 | HR 61 | Temp 98.3°F | Wt 154.0 lb

## 2012-04-14 DIAGNOSIS — I4891 Unspecified atrial fibrillation: Secondary | ICD-10-CM

## 2012-04-14 DIAGNOSIS — I10 Essential (primary) hypertension: Secondary | ICD-10-CM

## 2012-04-14 DIAGNOSIS — E785 Hyperlipidemia, unspecified: Secondary | ICD-10-CM

## 2012-04-14 LAB — COMPREHENSIVE METABOLIC PANEL
ALT: 17 U/L (ref 0–35)
AST: 17 U/L (ref 0–37)
Albumin: 4 g/dL (ref 3.5–5.2)
Alkaline Phosphatase: 92 U/L (ref 39–117)
BUN: 19 mg/dL (ref 6–23)
CO2: 27 mEq/L (ref 19–32)
Calcium: 9.1 mg/dL (ref 8.4–10.5)
Chloride: 103 mEq/L (ref 96–112)
Creatinine, Ser: 1.4 mg/dL — ABNORMAL HIGH (ref 0.4–1.2)
GFR: 39.01 mL/min — ABNORMAL LOW (ref >60.00)
Glucose, Bld: 102 mg/dL — ABNORMAL HIGH (ref 70–99)
Potassium: 4 mEq/L (ref 3.5–5.1)
Sodium: 139 mEq/L (ref 135–145)
Total Bilirubin: 0.5 mg/dL (ref 0.3–1.2)
Total Protein: 7.1 g/dL (ref 6.0–8.3)

## 2012-04-14 LAB — LIPID PANEL
Cholesterol: 158 mg/dL (ref 0–200)
HDL: 48.6 mg/dL (ref >39.00)
LDL Cholesterol: 79 mg/dL (ref 0–99)
Total CHOL/HDL Ratio: 3
Triglycerides: 154 mg/dL — ABNORMAL HIGH (ref 0.0–149.0)
VLDL: 30.8 mg/dL (ref 0.0–40.0)

## 2012-04-14 NOTE — Progress Notes (Signed)
Subjective:    Patient ID: Jessica [name], female    DOB: 04-05-1929, 79 y.o.   MRN: [id number]  HPI 79 year old female with history of abdominal aortic aneurysm, hyperlipidemia, hypertension presents for followup. She reports she is generally been feeling well. She questions whether she needs to continue on her cholesterol medication. She was watching a television program in which a doctor suggested this was not necessary. She denies any side effects from her medication except for occasional muscle cramping. She denies any recent chest pain, headache, palpitation. No new concerns today.  Outpatient Encounter Prescriptions as of 04/14/2012  Medication Sig Dispense Refill  . albuterol (VENTOLIN HFA) 108 (90 BASE) MCG/ACT inhaler Inhale two puffs every 4-6 hours as needed  1 Inhaler  0  . atenolol (TENORMIN) 25 MG tablet Take 1 tablet (25 mg total) by mouth daily.  180 tablet  3  . atorvastatin (LIPITOR) 40 MG tablet Take 1 tablet (40 mg total) by mouth daily.  90 tablet  1  . budesonide-formoterol (SYMBICORT) 80-4.5 MCG/ACT inhaler Inhale 2 puffs into the lungs 2 (two) times daily.  1 Inhaler  0  . vitamin B-12 (CYANOCOBALAMIN) 500 MCG tablet Take 500 mcg by mouth daily.      Marland Kitchen warfarin (COUMADIN) 5 MG tablet Take as directed by anticoagulation clinic  100 tablet  1  . ezetimibe (ZETIA) 10 MG tablet Take 1 tablet (10 mg total) by mouth daily.  42 tablet  0   No facility-administered encounter medications on file as of 04/14/2012.   BP 130/80  Pulse 61  Temp(Src) 98.3 F (36.8 C) (Oral)  Wt 154 lb (69.854 kg)  BMI 25.63 kg/m2  SpO2 95%  Review of Systems  Constitutional: Negative for fever, chills, appetite change, fatigue and unexpected weight change.  HENT: Negative for ear pain, congestion, sore throat, trouble swallowing, neck pain, voice change and sinus pressure.   Eyes: Negative for visual disturbance.  Respiratory: Negative for cough, shortness of breath, wheezing and stridor.    Cardiovascular: Negative for chest pain, palpitations and leg swelling.  Gastrointestinal: Negative for nausea, vomiting, abdominal pain, diarrhea, constipation, blood in stool, abdominal distention and anal bleeding.  Genitourinary: Negative for dysuria and flank pain.  Musculoskeletal: Negative for myalgias, arthralgias and gait problem.  Skin: Negative for color change and rash.  Neurological: Negative for dizziness and headaches.  Hematological: Negative for adenopathy. Does not bruise/bleed easily.  Psychiatric/Behavioral: Negative for suicidal ideas, sleep disturbance and dysphoric mood. The patient is not nervous/anxious.        Objective:   Physical Exam  Constitutional: She is oriented to person, place, and time. She appears well-developed and well-nourished. No distress.  HENT:  Head: Normocephalic and atraumatic.  Right Ear: External ear normal.  Left Ear: External ear normal.  Nose: Nose normal.  Mouth/Throat: Oropharynx is clear and moist. No oropharyngeal exudate.  Eyes: Conjunctivae are normal. Pupils are equal, round, and reactive to light. Right eye exhibits no discharge. Left eye exhibits no discharge. No scleral icterus.  Neck: Normal range of motion. Neck supple. No tracheal deviation present. No thyromegaly present.  Cardiovascular: Normal rate, regular rhythm, normal heart sounds and intact distal pulses.  Exam reveals no gallop and no friction rub.   No murmur heard. Pulmonary/Chest: Effort normal and breath sounds normal. No respiratory distress. She has no wheezes. She has no rales. She exhibits no tenderness.  Musculoskeletal: Normal range of motion. She exhibits no edema and no tenderness.  Lymphadenopathy:    She  has no cervical adenopathy.  Neurological: She is alert and oriented to person, place, and time. No cranial nerve deficit. She exhibits normal muscle tone. Coordination normal.  Skin: Skin is warm and dry. No rash noted. She is not diaphoretic. No  erythema. No pallor.  Psychiatric: She has a normal mood and affect. Her behavior is normal. Judgment and thought content normal.          Assessment & Plan:

## 2012-04-14 NOTE — Assessment & Plan Note (Signed)
BP Readings from Last 3 Encounters:  04/14/12 130/80  01/02/12 132/72  11/02/11 138/78   Blood pressures well-controlled on atenolol. We'll continue.

## 2012-04-14 NOTE — Assessment & Plan Note (Signed)
Normal sinus rhythm today. We will controlled with atenolol. On chronic anticoagulation with Coumadin. No changes made to medications today.

## 2012-04-14 NOTE — Assessment & Plan Note (Signed)
Patient was recently started on Zetia in addition to atorvastatin. Will check lipids and LFTs with labs today. Strongly encouraged her to continue on her cholesterol medications given history of peripheral vascular disease.

## 2012-05-07 ENCOUNTER — Ambulatory Visit (INDEPENDENT_AMBULATORY_CARE_PROVIDER_SITE_OTHER): Payer: Medicare Other

## 2012-05-07 DIAGNOSIS — I4891 Unspecified atrial fibrillation: Secondary | ICD-10-CM

## 2012-05-07 DIAGNOSIS — Z7901 Long term (current) use of anticoagulants: Secondary | ICD-10-CM

## 2012-05-07 LAB — POCT INR: INR: 2.3

## 2012-05-28 ENCOUNTER — Other Ambulatory Visit: Payer: Self-pay | Admitting: *Deleted

## 2012-05-28 MED ORDER — WARFARIN SODIUM 5 MG PO TABS: ORAL_TABLET | ORAL | Status: DC

## 2012-06-04 ENCOUNTER — Ambulatory Visit (INDEPENDENT_AMBULATORY_CARE_PROVIDER_SITE_OTHER): Payer: Medicare Other

## 2012-06-04 DIAGNOSIS — Z7901 Long term (current) use of anticoagulants: Secondary | ICD-10-CM

## 2012-06-04 DIAGNOSIS — I4891 Unspecified atrial fibrillation: Secondary | ICD-10-CM

## 2012-06-04 LAB — POCT INR: INR: 2.6

## 2012-07-02 ENCOUNTER — Ambulatory Visit (INDEPENDENT_AMBULATORY_CARE_PROVIDER_SITE_OTHER): Payer: Medicare Other

## 2012-07-02 DIAGNOSIS — Z7901 Long term (current) use of anticoagulants: Secondary | ICD-10-CM

## 2012-07-02 DIAGNOSIS — I4891 Unspecified atrial fibrillation: Secondary | ICD-10-CM

## 2012-07-02 LAB — POCT INR: INR: 2.5

## 2012-07-09 ENCOUNTER — Telehealth: Payer: Self-pay | Admitting: *Deleted

## 2012-07-09 NOTE — Telephone Encounter (Signed)
I advised pt against using aleve since she is taking warfarin and has hx HTN She verb understanding and will try Tylenol instead

## 2012-07-09 NOTE — Telephone Encounter (Signed)
She is having back problems and wants to know if she can take Aleve 200mg . thanks

## 2012-08-13 ENCOUNTER — Ambulatory Visit (INDEPENDENT_AMBULATORY_CARE_PROVIDER_SITE_OTHER): Payer: Medicare Other | Admitting: Cardiovascular Disease

## 2012-08-13 ENCOUNTER — Encounter: Payer: Self-pay | Admitting: Cardiovascular Disease

## 2012-08-13 VITALS — BP 132/82 | HR 62 | Ht 67.0 in | Wt 150.2 lb

## 2012-08-13 DIAGNOSIS — E785 Hyperlipidemia, unspecified: Secondary | ICD-10-CM

## 2012-08-13 DIAGNOSIS — Z7901 Long term (current) use of anticoagulants: Secondary | ICD-10-CM

## 2012-08-13 DIAGNOSIS — J449 Chronic obstructive pulmonary disease, unspecified: Secondary | ICD-10-CM

## 2012-08-13 DIAGNOSIS — I10 Essential (primary) hypertension: Secondary | ICD-10-CM

## 2012-08-13 DIAGNOSIS — I4891 Unspecified atrial fibrillation: Secondary | ICD-10-CM

## 2012-08-13 LAB — POCT INR: INR: 3.2

## 2012-08-13 NOTE — Assessment & Plan Note (Signed)
Cholesterol is at goal on the current lipid regimen. No changes to the medications were made.  

## 2012-08-13 NOTE — Assessment & Plan Note (Signed)
Mild stable shortness of breath. She sees Dr. Lake Bells

## 2012-08-13 NOTE — Assessment & Plan Note (Signed)
Appears to be maintaining normal sinus rhythm. Prior note by EP did not suggest atrial fibrillation noted on pacemaker check. We'll continue warfarin for now. She "does not want to have a stroke".

## 2012-08-13 NOTE — Assessment & Plan Note (Signed)
Blood pressure is well controlled on today's visit. No changes made to the medications. 

## 2012-08-13 NOTE — Patient Instructions (Addendum)
You are doing well. No medication changes were made.  Please call us if you have new issues that need to be addressed before your next appt.  Your physician wants you to follow-up in: 12 months.  You will receive a reminder letter in the mail two months in advance. If you don't receive a letter, please call our office to schedule the follow-up appointment. 

## 2012-08-13 NOTE — Progress Notes (Signed)
Patient ID: Jessica [name], female    DOB: 13-Jun-1929, 79 y.o.   MRN: [id number]  HPI Comments: Jessica [name] has a history of peripheral vascular disease, status post abdominal aortic aneurysm repair in 2009,  long smoking history who stopped 15 years ago , history of complete heart block status post pacemaker implant, herna repair in 2009 with post op atrial fib, HTN, prior TIA per the notes, carotid u/s showing mild carotid disease in 2011 with atypical vessel on the left contributing to her bruit, previous pains in her  neck radiating to her arms bilaterally occurring at night and awakening her from sleep, relieved by repositioning herself in bed. She presents for routine follow up.  She has COPD, 50 years of smoking. She reports that she is doing well. She denies any shortness of breath, chest pain. No significant palpitations concerning for arrhythmia. She does continue to have leg cramps and uses a heating blanket with manageable symptoms. She's not taking zetia. She did take one month of samples and stopped the medication. She reports that her prior cholesterol numbers several months ago were on generic Lipitor alone No lightheadedness, diaphoresis with activity.   She does report having chronic pain in her back  Last Myoview October 2012 showing no ischemia   EKG shows Paced rhythm at rate of 62 beats per minute    Outpatient Encounter Prescriptions as of 08/13/2012  Medication Sig Dispense Refill  . albuterol (VENTOLIN HFA) 108 (90 BASE) MCG/ACT inhaler Inhale two puffs every 4-6 hours as needed  1 Inhaler  0  . atenolol (TENORMIN) 25 MG tablet Take 1 tablet (25 mg total) by mouth daily.  180 tablet  3  . atorvastatin (LIPITOR) 40 MG tablet Take 1 tablet (40 mg total) by mouth daily.  90 tablet  1  . vitamin B-12 (CYANOCOBALAMIN) 500 MCG tablet Take 500 mcg by mouth daily.      Marland Kitchen warfarin (COUMADIN) 5 MG tablet Take as directed by anticoagulation clinic  100 tablet  1  . [DISCONTINUED]  budesonide-formoterol (SYMBICORT) 80-4.5 MCG/ACT inhaler Inhale 2 puffs into the lungs 2 (two) times daily.  1 Inhaler  0  . [DISCONTINUED] ezetimibe (ZETIA) 10 MG tablet Take 1 tablet (10 mg total) by mouth daily.  42 tablet  0   No facility-administered encounter medications on file as of 08/13/2012.     Review of Systems  Constitutional: Negative.   HENT: Negative.   Eyes: Negative.   Cardiovascular: Negative.   Gastrointestinal: Negative.   Musculoskeletal: Positive for back pain and gait problem.       Leg cramping at night  Skin: Negative.   Neurological: Negative.   Psychiatric/Behavioral: Negative.   All other systems reviewed and are negative.    BP 132/82  Pulse 62  Ht 5\' 7"  (1.702 m)  Wt 150 lb 4 oz (68.153 kg)  BMI 23.53 kg/m2  Physical Exam  Nursing note and vitals reviewed. Constitutional: She is oriented to person, place, and time. She appears well-developed and well-nourished.  HENT:  Head: Normocephalic.  Nose: Nose normal.  Mouth/Throat: Oropharynx is clear and moist.  Eyes: Conjunctivae are normal. Pupils are equal, round, and reactive to light.  Neck: Normal range of motion. Neck supple. No JVD present. Carotid bruit is present.  Cardiovascular: Normal rate, regular rhythm, S1 normal, S2 normal and intact distal pulses.  Exam reveals no gallop and no friction rub.   Murmur heard.  Crescendo systolic murmur is present with a grade of  2/6  Pulmonary/Chest: Effort normal and breath sounds normal. No respiratory distress. She has no wheezes. She has no rales. She exhibits no tenderness.  Abdominal: Soft. Bowel sounds are normal. She exhibits no distension. There is no tenderness.  Musculoskeletal: Normal range of motion. She exhibits no edema and no tenderness.  Lymphadenopathy:    She has no cervical adenopathy.  Neurological: She is alert and oriented to person, place, and time. Coordination normal.  Skin: Skin is warm and dry. No rash noted. No  erythema.  Psychiatric: She has a normal mood and affect. Her behavior is normal. Judgment and thought content normal.    Assessment and Plan

## 2012-08-20 ENCOUNTER — Other Ambulatory Visit: Payer: Self-pay | Admitting: *Deleted

## 2012-08-20 DIAGNOSIS — E785 Hyperlipidemia, unspecified: Secondary | ICD-10-CM

## 2012-08-20 MED ORDER — ATORVASTATIN CALCIUM 40 MG PO TABS: 40.0000 mg | ORAL_TABLET | Freq: Every day | ORAL | Status: DC

## 2012-08-20 NOTE — Telephone Encounter (Signed)
Eprescribed.

## 2012-09-01 ENCOUNTER — Encounter: Payer: Self-pay | Admitting: Internal Medicine

## 2012-09-17 ENCOUNTER — Ambulatory Visit (INDEPENDENT_AMBULATORY_CARE_PROVIDER_SITE_OTHER): Payer: Medicare Other

## 2012-09-17 DIAGNOSIS — Z7901 Long term (current) use of anticoagulants: Secondary | ICD-10-CM

## 2012-09-17 DIAGNOSIS — I4891 Unspecified atrial fibrillation: Secondary | ICD-10-CM

## 2012-09-17 LAB — POCT INR: INR: 3.4

## 2012-10-15 ENCOUNTER — Ambulatory Visit (INDEPENDENT_AMBULATORY_CARE_PROVIDER_SITE_OTHER): Payer: Medicare Other

## 2012-10-15 DIAGNOSIS — Z7901 Long term (current) use of anticoagulants: Secondary | ICD-10-CM

## 2012-10-15 DIAGNOSIS — I4891 Unspecified atrial fibrillation: Secondary | ICD-10-CM

## 2012-10-15 LAB — POCT INR: INR: 2.7

## 2012-11-04 ENCOUNTER — Ambulatory Visit (INDEPENDENT_AMBULATORY_CARE_PROVIDER_SITE_OTHER): Payer: Medicare Other | Admitting: Internal Medicine

## 2012-11-04 ENCOUNTER — Encounter: Payer: Self-pay | Admitting: Internal Medicine

## 2012-11-04 VITALS — BP 120/84 | HR 71 | Ht 67.0 in | Wt 151.5 lb

## 2012-11-04 DIAGNOSIS — I4891 Unspecified atrial fibrillation: Secondary | ICD-10-CM

## 2012-11-04 DIAGNOSIS — R609 Edema, unspecified: Secondary | ICD-10-CM

## 2012-11-04 DIAGNOSIS — Z950 Presence of cardiac pacemaker: Secondary | ICD-10-CM

## 2012-11-04 DIAGNOSIS — I442 Atrioventricular block, complete: Secondary | ICD-10-CM

## 2012-11-04 DIAGNOSIS — J449 Chronic obstructive pulmonary disease, unspecified: Secondary | ICD-10-CM

## 2012-11-04 DIAGNOSIS — J4489 Other specified chronic obstructive pulmonary disease: Secondary | ICD-10-CM

## 2012-11-04 LAB — PACEMAKER DEVICE OBSERVATION
AL AMPLITUDE: 1 mv
AL IMPEDENCE PM: 361 Ohm
ATRIAL PACING PM: 85
BAMS-0001: 150 {beats}/min
BAMS-0003: 70 {beats}/min
BATTERY VOLTAGE: 2.76 V
BRDY-0002RV: 60 {beats}/min
BRDY-0003RV: 120 {beats}/min
BRDY-0004RV: 120 {beats}/min
DEVICE MODEL PM: 1236137
RV LEAD AMPLITUDE: 4 mv
RV LEAD IMPEDENCE PM: 479 Ohm
RV LEAD THRESHOLD: 0.75 V
VENTRICULAR PACING PM: 96

## 2012-11-04 MED ORDER — HYDROCHLOROTHIAZIDE 25 MG PO TABS: ORAL_TABLET | ORAL | Status: DC

## 2012-11-04 NOTE — Assessment & Plan Note (Signed)
Significant ongoing shortness of breath. We will refer her back to Dr. Lake Bells ambulatory O2 sats today are 64

## 2012-11-04 NOTE — Assessment & Plan Note (Signed)
The patient's device was interrogated.  The information was reviewed. No changes were made in the programming.    

## 2012-11-04 NOTE — Progress Notes (Signed)
      Patient Care Team: Rica Mast, MD as PCP - General (Internal Medicine) Deboraha Sprang, MD (Cardiology) Minna Merritts, MD (Cardiology)   HPI  Jessica [name] is a 79 y.o. female seen in followup for a pacemaker implanted for complete heart block now status post device generator replacement.  She also has a history of atrial fibrillation and is on Coumadin. She has a CHADS VASC score of 6 ; she is a prior TIA. She has had no recurrent palpitations.  She has chronic shortness of breath. We undertook stress testing with Myoview2012   was negative for ischemia  Catheterization is in the remote past   She previously was on an inhaler which had been quite helpful.. Apparently there are issues with insurance.she denies chest pain.   she also has peripheral vascular disease and status post AAA repair area and   Past Medical History  Diagnosis Date  . Atrioventricular block, complete   . Cardiac pacemaker in situ   . Abdominal aneurysm without mention of rupture   . Hernia   . AAA (abdominal aortic aneurysm)   . Hypertension   . Hyperlipidemia     Past Surgical History  Procedure Laterality Date  . Ohter      growth on colon surgery  . Tonsillectomy    . Abdominal aortic aneurysm repair    . Hernia repair      Current Outpatient Prescriptions  Medication Sig Dispense Refill  . albuterol (VENTOLIN HFA) 108 (90 BASE) MCG/ACT inhaler Inhale two puffs every 4-6 hours as needed  1 Inhaler  0  . atenolol (TENORMIN) 25 MG tablet Take 1 tablet (25 mg total) by mouth daily.  180 tablet  3  . atorvastatin (LIPITOR) 40 MG tablet Take 1 tablet (40 mg total) by mouth daily.  90 tablet  0  . cetirizine (ZYRTEC) 10 MG tablet Take 10 mg by mouth daily.      . vitamin B-12 (CYANOCOBALAMIN) 500 MCG tablet Take 500 mcg by mouth daily.      Marland Kitchen warfarin (COUMADIN) 5 MG tablet Take as directed by anticoagulation clinic  100 tablet  1   No current facility-administered  medications for this visit.    Allergies  Allergen Reactions  . Codeine     Made her feel crazy  . Morphine     Made her feel crazy    Review of Systems negative except from HPI and PMH  Physical Exam BP 120/84  Pulse 71  Ht 5\' 7"  (1.702 m)  Wt 151 lb 8 oz (68.72 kg)  BMI 23.72 kg/m2 Well developed and well nourished in no acute distress HENT normal E scleral and icterus clear Neck Supple Clear to ausculation  Regular rate and rhythm, no murmurs gallops or rub Soft with active bowel sounds No clubbing cyanosis no  Edema Alert and oriented, grossly normal motor and sensory function Skin Warm and Dry  ECG demonstrates atrial fibrillation and ventricular pacing  Assessment and  Plan

## 2012-11-04 NOTE — Patient Instructions (Addendum)
Your physician wants you to follow-up in: 6 months with Nevin Bloodgood for a device check & 1 year with Dr. Caryl Comes. You will receive a reminder letter in the mail two months in advance. If you don't receive a letter, please call our office to schedule the follow-up appointment.  Your physician has recommended you make the following change in your medication:  1) HCTZ 25 mg one tablet by mouth daily as needed for swelling.  Your physician recommends that you follow up with Dr. Lake Bells: Tuesday 11/18/12 at 11:15 am.

## 2012-11-04 NOTE — Assessment & Plan Note (Signed)
She continues with paroxysms of atrial fibrillation. Rate control is not an issue. She will continue on warfarin. I don't think it relates daily to her dyspnea which is more chronic s her atrial fibrillation  is paroxysmal

## 2012-11-04 NOTE — Assessment & Plan Note (Signed)
Stable post pacing 

## 2012-11-12 ENCOUNTER — Ambulatory Visit (INDEPENDENT_AMBULATORY_CARE_PROVIDER_SITE_OTHER): Payer: Medicare Other | Admitting: General Practice

## 2012-11-12 DIAGNOSIS — Z7901 Long term (current) use of anticoagulants: Secondary | ICD-10-CM

## 2012-11-12 DIAGNOSIS — I4891 Unspecified atrial fibrillation: Secondary | ICD-10-CM

## 2012-11-12 LAB — POCT INR: INR: 4.1

## 2012-11-18 ENCOUNTER — Encounter: Payer: Self-pay | Admitting: *Deleted

## 2012-11-18 ENCOUNTER — Ambulatory Visit: Payer: Medicare Other | Admitting: Pulmonary Disease

## 2012-11-26 ENCOUNTER — Ambulatory Visit (INDEPENDENT_AMBULATORY_CARE_PROVIDER_SITE_OTHER): Payer: Medicare Other | Admitting: General Practice

## 2012-11-26 ENCOUNTER — Ambulatory Visit (INDEPENDENT_AMBULATORY_CARE_PROVIDER_SITE_OTHER): Payer: Medicare Other | Admitting: Pulmonary Disease

## 2012-11-26 ENCOUNTER — Ambulatory Visit (INDEPENDENT_AMBULATORY_CARE_PROVIDER_SITE_OTHER)
Admission: RE | Admit: 2012-11-26 | Discharge: 2012-11-26 | Disposition: A | Discharge status: Home / Self Care | Payer: Medicare Other | Source: Ambulatory Visit | Attending: Pulmonary Disease | Admitting: Pulmonary Disease

## 2012-11-26 ENCOUNTER — Encounter: Payer: Self-pay | Admitting: Pulmonary Disease

## 2012-11-26 VITALS — BP 122/86 | HR 59 | Ht 67.0 in | Wt 152.4 lb

## 2012-11-26 DIAGNOSIS — I4891 Unspecified atrial fibrillation: Secondary | ICD-10-CM

## 2012-11-26 DIAGNOSIS — R0602 Shortness of breath: Secondary | ICD-10-CM

## 2012-11-26 DIAGNOSIS — Z7901 Long term (current) use of anticoagulants: Secondary | ICD-10-CM

## 2012-11-26 DIAGNOSIS — J309 Allergic rhinitis, unspecified: Secondary | ICD-10-CM

## 2012-11-26 DIAGNOSIS — J449 Chronic obstructive pulmonary disease, unspecified: Secondary | ICD-10-CM

## 2012-11-26 DIAGNOSIS — Z23 Encounter for immunization: Secondary | ICD-10-CM

## 2012-11-26 LAB — POCT INR: INR: 2

## 2012-11-26 MED ORDER — TIOTROPIUM BROMIDE MONOHYDRATE 18 MCG IN CAPS: 18.0000 ug | ORAL_CAPSULE | Freq: Every day | RESPIRATORY_TRACT | Status: DC

## 2012-11-26 MED ORDER — ALBUTEROL SULFATE HFA 108 (90 BASE) MCG/ACT IN AERS: 2.0000 | INHALATION_SPRAY | Freq: Four times a day (QID) | RESPIRATORY_TRACT | Status: DC | PRN

## 2012-11-26 NOTE — Assessment & Plan Note (Signed)
Jessica [name] has severe COPD and has not been using any inhalers recently so this is the main reason why she is short of breath.  We spent a lot of time today talking about cost of medicines and ease of use.  Spiriva is really the best choice for her.  Plan: -flu shot -restart Spiriva -CXR -prn albuterol

## 2012-11-26 NOTE — Assessment & Plan Note (Signed)
I recommended generic, OTC Zyrtec and Nasacort

## 2012-11-26 NOTE — Patient Instructions (Addendum)
Go to the [facility] office for a chest x-ray Take Spiriva daily Use the albuterol as needed for shortness of breath (2 puffs every four to six hours as needed)  For your runny nose: -try generic Zyrtec (cetirizine) daily -if that doesn't work, use nasacort two sprays each nostril daily (this only works if you use it regularly for several days in a row)  We will see you back in 6 weeks or sooner if needed

## 2012-11-26 NOTE — Progress Notes (Signed)
Subjective:    Patient ID: Jessica [name], female    DOB: [birth year], 79 y.o.   MRN: [id number]  Synopsis: Jessica [name] first saw the [facility] Pulmonary clinic in 09/2011 for GOLD grade C COPD.  She smoked until 2001.  In 2013 she had spirometry which showed ratio 53%, FEV1 0.97 L (44% predicted) and an MMRC score of 1.  HPI  11/26/2012 ROV > She has been having trouble breathing for years and is back to see [place] for that.  She wonders if she is allergic to her dog because she has a lot of runny nose and watery eyes often when at home.  She wonders if she is allergic to her dog.  She is not exerciseing as much and has not used any inhalers in the last several months.   NO chest pain, no chest tightness.   She does not have a cough or sputum production.  Past Medical History  Diagnosis Date  . Atrioventricular block, complete   . Cardiac pacemaker in situ   . Abdominal aneurysm without mention of rupture   . Hernia   . AAA (abdominal aortic aneurysm)   . Hypertension   . Hyperlipidemia      Review of Systems  Constitutional: Negative for fever, chills and fatigue.  HENT: Positive for postnasal drip and rhinorrhea. Negative for congestion and sinus pressure.   Respiratory: Positive for shortness of breath. Negative for cough and wheezing.   Cardiovascular: Positive for leg swelling. Negative for chest pain and palpitations.       Objective:   Physical Exam Filed Vitals:   11/26/12 1103  BP: 122/86  Pulse: 59  Height: 5\' 7"  (1.702 m)  Weight: 152 lb 6.4 oz (69.128 kg)  SpO2: 94%   RA  Gen: well appearing, no acute distress HEENT: NCAT, PERRL, EOMi, OP clear, neck supple without masses PULM: CTA B CV: RRR, no mgr, no JVD AB: BS+, soft, nontender, no hsm Ext: warm, no edema, no clubbing, no cyanosis      Assessment & Plan:   COPD (chronic obstructive pulmonary disease) Jessica [name] has severe COPD and has not been using any inhalers recently so this is the main reason why she  is short of breath.  We spent a lot of time today talking about cost of medicines and ease of use.  Spiriva is really the best choice for her.  Plan: -flu shot -restart Spiriva -CXR -prn albuterol  Allergic rhinitis I recommended generic, OTC Zyrtec and Nasacort    Updated Medication List Outpatient Encounter Prescriptions as of 11/26/2012  Medication Sig  . atenolol (TENORMIN) 25 MG tablet Take 1 tablet (25 mg total) by mouth daily.  Marland Kitchen atorvastatin (LIPITOR) 40 MG tablet Take 1 tablet (40 mg total) by mouth daily.  . cetirizine (ZYRTEC) 10 MG tablet Take 10 mg by mouth daily. As needed  . hydrochlorothiazide (HYDRODIURIL) 25 MG tablet Take one tablet by mouth daily as needed for swelling  . vitamin B-12 (CYANOCOBALAMIN) 500 MCG tablet Take 500 mcg by mouth daily.  Marland Kitchen warfarin (COUMADIN) 5 MG tablet Take as directed by anticoagulation clinic  . albuterol (PROAIR HFA) 108 (90 BASE) MCG/ACT inhaler Inhale 2 puffs into the lungs every 6 (six) hours as needed for wheezing or shortness of breath.  . tiotropium (SPIRIVA HANDIHALER) 18 MCG inhalation capsule Place 1 capsule (18 mcg total) into inhaler and inhale daily.  . [DISCONTINUED] albuterol (VENTOLIN HFA) 108 (90 BASE) MCG/ACT inhaler Inhale two puffs every 4-6  hours as needed

## 2012-11-27 ENCOUNTER — Telehealth: Payer: Self-pay | Admitting: Pulmonary Disease

## 2012-11-27 DIAGNOSIS — R9389 Abnormal findings on diagnostic imaging of other specified body structures: Secondary | ICD-10-CM

## 2012-11-27 MED ORDER — AZITHROMYCIN 250 MG PO TABS: ORAL_TABLET | ORAL | Status: DC

## 2012-11-27 NOTE — Telephone Encounter (Signed)
I called Jessica [name] to discuss the result of the CXR which I have reviewed.   Called in a [place] for her to take this week, then repeat the CXR next week at [facility].

## 2012-12-02 ENCOUNTER — Telehealth: Payer: Self-pay | Admitting: Pulmonary Disease

## 2012-12-02 ENCOUNTER — Ambulatory Visit (INDEPENDENT_AMBULATORY_CARE_PROVIDER_SITE_OTHER)
Admission: RE | Admit: 2012-12-02 | Discharge: 2012-12-02 | Disposition: A | Discharge status: Home / Self Care | Payer: Medicare Other | Source: Ambulatory Visit | Attending: Pulmonary Disease | Admitting: Pulmonary Disease

## 2012-12-02 DIAGNOSIS — R9389 Abnormal findings on diagnostic imaging of other specified body structures: Secondary | ICD-10-CM

## 2012-12-02 DIAGNOSIS — R918 Other nonspecific abnormal finding of lung field: Secondary | ICD-10-CM

## 2012-12-02 NOTE — Telephone Encounter (Signed)
I called Jessica [name] to discuss the results of her CXR which showed a persistent change in her left chest.  Radiology recommends a CT which is reasonable.  I have ordered a CT chest to evaluate this.  I am cc'ing triage so that they can contact the patient with details of this tomorrow (12/03/12)

## 2012-12-03 ENCOUNTER — Other Ambulatory Visit: Payer: Medicare Other

## 2012-12-03 NOTE — Telephone Encounter (Signed)
Please advise PCC;s thanks 

## 2012-12-08 ENCOUNTER — Ambulatory Visit (INDEPENDENT_AMBULATORY_CARE_PROVIDER_SITE_OTHER)
Admission: RE | Admit: 2012-12-08 | Discharge: 2012-12-08 | Disposition: A | Discharge status: Home / Self Care | Payer: Medicare Other | Source: Ambulatory Visit | Attending: Pulmonary Disease | Admitting: Pulmonary Disease

## 2012-12-08 DIAGNOSIS — R9389 Abnormal findings on diagnostic imaging of other specified body structures: Secondary | ICD-10-CM

## 2012-12-08 DIAGNOSIS — R918 Other nonspecific abnormal finding of lung field: Secondary | ICD-10-CM

## 2012-12-17 ENCOUNTER — Ambulatory Visit (INDEPENDENT_AMBULATORY_CARE_PROVIDER_SITE_OTHER): Payer: Medicare Other | Admitting: General Practice

## 2012-12-17 DIAGNOSIS — I4891 Unspecified atrial fibrillation: Secondary | ICD-10-CM

## 2012-12-17 DIAGNOSIS — Z7901 Long term (current) use of anticoagulants: Secondary | ICD-10-CM

## 2012-12-17 LAB — POCT INR: INR: 2.6

## 2012-12-24 ENCOUNTER — Other Ambulatory Visit: Payer: Self-pay | Admitting: *Deleted

## 2012-12-24 MED ORDER — WARFARIN SODIUM 5 MG PO TABS: ORAL_TABLET | ORAL | Status: DC

## 2013-01-05 ENCOUNTER — Encounter: Payer: Self-pay | Admitting: Pulmonary Disease

## 2013-01-05 ENCOUNTER — Ambulatory Visit (INDEPENDENT_AMBULATORY_CARE_PROVIDER_SITE_OTHER): Payer: Medicare Other | Admitting: Pulmonary Disease

## 2013-01-05 VITALS — BP 116/76 | HR 62 | Ht 67.0 in | Wt 151.0 lb

## 2013-01-05 DIAGNOSIS — J449 Chronic obstructive pulmonary disease, unspecified: Secondary | ICD-10-CM

## 2013-01-05 NOTE — Assessment & Plan Note (Signed)
This has been a stable interval for [facility].  She is doing really well on Spiriva.  Plan: -continue spiriva -f/u one year  -flu shot next year

## 2013-01-05 NOTE — Patient Instructions (Signed)
Keep taking your medicines as you are doing Get a flu shot again next year We will see you back in one year If you get sinus drainage between now and then, try over the counter Nasacort 2 puffs each nostril daily

## 2013-01-05 NOTE — Progress Notes (Signed)
Subjective:    Patient ID: Jessica [name], female    DOB: 12/20/1929, 79 y.o.   MRN: [id number]  Synopsis: [name] first saw the [facility] Pulmonary clinic in 09/2011 for GOLD grade C COPD.  She smoked until 2001.  In 2013 she had spirometry which showed ratio 53%, FEV1 0.97 L (44% predicted) and an MMRC score of 1.  HPI   11/26/2012 ROV > She has been having trouble breathing for years and is back to see [place] for that.  She wonders if she is allergic to her dog because she has a lot of runny nose and watery eyes often when at home.  She wonders if she is allergic to her dog.  She is not exerciseing as much and has not used any inhalers in the last several months.   NO chest pain, no chest tightness.   She does not have a cough or sputum production.  01/05/2013 ROV >> [name] has really been doing better since her last visit.  All her CXR and CT results turned out to show just a scar in her lingula, nothing more.  She has been using the Spiriva daily and has been feeling well since then.  She has lost 5 lbs since the last visit.  She can walk further than last time. She uses her inhaler when she exerts herself which helps.  She was able to breath easier in [place].   Past Medical History  Diagnosis Date  . Atrioventricular block, complete   . Cardiac pacemaker in situ   . Abdominal aneurysm without mention of rupture   . Hernia   . AAA (abdominal aortic aneurysm)   . Hypertension   . Hyperlipidemia      Review of Systems  Constitutional: Negative for fever, chills and fatigue.  HENT: Positive for postnasal drip and rhinorrhea. Negative for congestion and sinus pressure.   Respiratory: Positive for shortness of breath. Negative for cough and wheezing.   Cardiovascular: Positive for leg swelling. Negative for chest pain and palpitations.       Objective:   Physical Exam  Filed Vitals:   01/05/13 1545  BP: 116/76  Pulse: 62  Height: 5\' 7"  (1.702 m)  Weight: 151 lb (68.493  kg)  SpO2: 94%   RA  Gen: well appearing, no acute distress HEENT: NCAT, EOMi, OP clear PULM: CTA B CV: RRR, no mgr, no JVD AB: BS+, soft, nontender, no hsm Ext: warm, no edema, no clubbing, no cyanosis      Assessment & Plan:   COPD (chronic obstructive pulmonary disease) This has been a stable interval for [name].  She is doing really well on Spiriva.  Plan: -continue spiriva -f/u one year  -flu shot next year    Updated Medication List Outpatient Encounter Prescriptions as of 01/05/2013  Medication Sig  . albuterol (PROAIR HFA) 108 (90 BASE) MCG/ACT inhaler Inhale 2 puffs into the lungs every 6 (six) hours as needed for wheezing or shortness of breath.  Marland Kitchen atenolol (TENORMIN) 25 MG tablet Take 1 tablet (25 mg total) by mouth daily.  Marland Kitchen atorvastatin (LIPITOR) 40 MG tablet Take 1 tablet (40 mg total) by mouth daily.  . hydrochlorothiazide (HYDRODIURIL) 25 MG tablet Take one tablet by mouth daily as needed for swelling  . tiotropium (SPIRIVA HANDIHALER) 18 MCG inhalation capsule Place 1 capsule (18 mcg total) into inhaler and inhale daily.  . vitamin B-12 (CYANOCOBALAMIN) 500 MCG tablet Take 500 mcg by mouth daily.  Marland Kitchen warfarin (  COUMADIN) 5 MG tablet Take as directed by anticoagulation clinic  . [DISCONTINUED] azithromycin (ZITHROMAX Z-PAK) 250 MG tablet Take 2 pills on the first day, then one pill daily for four days  . [DISCONTINUED] cetirizine (ZYRTEC) 10 MG tablet Take 10 mg by mouth daily. As needed

## 2013-01-21 ENCOUNTER — Ambulatory Visit (INDEPENDENT_AMBULATORY_CARE_PROVIDER_SITE_OTHER): Payer: Medicare Other

## 2013-01-21 DIAGNOSIS — Z7901 Long term (current) use of anticoagulants: Secondary | ICD-10-CM

## 2013-01-21 DIAGNOSIS — I4891 Unspecified atrial fibrillation: Secondary | ICD-10-CM

## 2013-01-21 LAB — POCT INR: INR: 2.4

## 2013-01-30 ENCOUNTER — Ambulatory Visit (INDEPENDENT_AMBULATORY_CARE_PROVIDER_SITE_OTHER): Payer: Medicare Other | Admitting: Internal Medicine

## 2013-01-30 ENCOUNTER — Encounter: Payer: Self-pay | Admitting: Internal Medicine

## 2013-01-30 VITALS — BP 122/78 | HR 74 | Temp 97.4°F | Wt 152.0 lb

## 2013-01-30 DIAGNOSIS — F32A Depression, unspecified: Secondary | ICD-10-CM

## 2013-01-30 DIAGNOSIS — J449 Chronic obstructive pulmonary disease, unspecified: Secondary | ICD-10-CM

## 2013-01-30 DIAGNOSIS — I10 Essential (primary) hypertension: Secondary | ICD-10-CM

## 2013-01-30 DIAGNOSIS — Z7901 Long term (current) use of anticoagulants: Secondary | ICD-10-CM

## 2013-01-30 DIAGNOSIS — F3289 Other specified depressive episodes: Secondary | ICD-10-CM

## 2013-01-30 DIAGNOSIS — E785 Hyperlipidemia, unspecified: Secondary | ICD-10-CM

## 2013-01-30 DIAGNOSIS — F329 Major depressive disorder, single episode, unspecified: Secondary | ICD-10-CM

## 2013-01-30 DIAGNOSIS — I4891 Unspecified atrial fibrillation: Secondary | ICD-10-CM

## 2013-01-30 LAB — COMPREHENSIVE METABOLIC PANEL
ALT: 16 U/L (ref 0–35)
AST: 19 U/L (ref 0–37)
Albumin: 4 g/dL (ref 3.5–5.2)
Alkaline Phosphatase: 82 U/L (ref 39–117)
BUN: 20 mg/dL (ref 6–23)
CO2: 30 mEq/L (ref 19–32)
Calcium: 9.5 mg/dL (ref 8.4–10.5)
Chloride: 103 mEq/L (ref 96–112)
Creatinine, Ser: 1.2 mg/dL (ref 0.4–1.2)
GFR: 44.88 mL/min — ABNORMAL LOW (ref >60.00)
Glucose, Bld: 92 mg/dL (ref 70–99)
Potassium: 3.8 mEq/L (ref 3.5–5.1)
Sodium: 141 mEq/L (ref 135–145)
Total Bilirubin: 1 mg/dL (ref 0.3–1.2)
Total Protein: 6.8 g/dL (ref 6.0–8.3)

## 2013-01-30 LAB — LIPID PANEL
Cholesterol: 148 mg/dL (ref 0–200)
HDL: 46.3 mg/dL (ref >39.00)
LDL Cholesterol: 78 mg/dL (ref 0–99)
Total CHOL/HDL Ratio: 3
Triglycerides: 117 mg/dL (ref 0.0–149.0)
VLDL: 23.4 mg/dL (ref 0.0–40.0)

## 2013-01-30 LAB — PROTIME-INR
INR: 2.8 ratio — ABNORMAL HIGH (ref 0.8–1.0)
Prothrombin Time: 29.5 s — ABNORMAL HIGH (ref 10.2–12.4)

## 2013-01-30 LAB — MICROALBUMIN / CREATININE URINE RATIO
Creatinine,U: 130.1 mg/dL
Microalb Creat Ratio: 2.2 mg/g (ref 0.0–30.0)
Microalb, Ur: 2.9 mg/dL — ABNORMAL HIGH (ref 0.0–1.9)

## 2013-01-30 MED ORDER — PAROXETINE HCL 10 MG PO TABS: 10.0000 mg | ORAL_TABLET | Freq: Every day | ORAL | Status: DC

## 2013-01-30 MED ORDER — ATORVASTATIN CALCIUM 40 MG PO TABS: 20.0000 mg | ORAL_TABLET | Freq: Every day | ORAL | Status: DC

## 2013-01-30 NOTE — Assessment & Plan Note (Signed)
Rate well-controlled with atenolol. Anticoagulated with Coumadin. CHADS2 score 2.

## 2013-01-30 NOTE — Assessment & Plan Note (Signed)
Recent worsening symptoms of depression. Will restart Paxil 10 mg daily as she tolerated this well with improvement in symptoms in the past. Plan for followup in about 8 weeks. She will call sooner if any problems with the medication or persistent symptoms of depressed mood.

## 2013-01-30 NOTE — Progress Notes (Signed)
Subjective:    Patient ID: Jessica [name], female    DOB: 1929/12/15, 80 y.o.   MRN: [id number]  HPI 80 year old female with history of COPD, hypertension, hyperlipidemia, atrial fibrillation presents for followup. She reports it has been a difficult time for her. She notes increased symptoms of apathy and depressed mood. In the past, she took Paxil 10 mg daily with some improvement in this. She would like to restart this medication. She denies suicidal ideation.  In regards to hypertension and atrial fibrillation, she reports symptoms have been well-controlled. She has been therapeutic on Coumadin based on recent INR. No noted easy bleeding or bruising.  In regards to COPD, she reports significant improvement in symptoms with Spiriva. No recent changes in shortness of breath or cough. She recently had a CT chest performed in November 2014. This showed some scar tissue and COPD but no other acute processes.  Outpatient Prescriptions Prior to Visit  Medication Sig Dispense Refill  . albuterol (PROAIR HFA) 108 (90 BASE) MCG/ACT inhaler Inhale 2 puffs into the lungs every 6 (six) hours as needed for wheezing or shortness of breath.  1 Inhaler  5  . atenolol (TENORMIN) 25 MG tablet Take 1 tablet (25 mg total) by mouth daily.  180 tablet  3  . hydrochlorothiazide (HYDRODIURIL) 25 MG tablet Take one tablet by mouth daily as needed for swelling  30 tablet  3  . tiotropium (SPIRIVA HANDIHALER) 18 MCG inhalation capsule Place 1 capsule (18 mcg total) into inhaler and inhale daily.  30 capsule  2  . warfarin (COUMADIN) 5 MG tablet Take as directed by anticoagulation clinic  100 tablet  1  . atorvastatin (LIPITOR) 40 MG tablet Take 1 tablet (40 mg total) by mouth daily.  90 tablet  0  . vitamin B-12 (CYANOCOBALAMIN) 500 MCG tablet Take 500 mcg by mouth daily.       No facility-administered medications prior to visit.   BP 122/78  Pulse 74  Temp(Src) 97.4 F (36.3 C) (Oral)  Wt 152 lb (68.947 kg)   SpO2 95%  Review of Systems  Constitutional: Negative for fever, chills, appetite change, fatigue and unexpected weight change.  HENT: Negative for congestion, ear pain, sinus pressure, sore throat, trouble swallowing and voice change.   Eyes: Negative for visual disturbance.  Respiratory: Negative for cough, shortness of breath, wheezing and stridor.   Cardiovascular: Negative for chest pain, palpitations and leg swelling.  Gastrointestinal: Negative for nausea, vomiting, abdominal pain, diarrhea, constipation, blood in stool, abdominal distention and anal bleeding.  Genitourinary: Negative for dysuria and flank pain.  Musculoskeletal: Negative for arthralgias, gait problem, myalgias and neck pain.  Skin: Negative for color change and rash.  Neurological: Negative for dizziness and headaches.  Hematological: Negative for adenopathy. Does not bruise/bleed easily.  Psychiatric/Behavioral: Positive for dysphoric mood. Negative for suicidal ideas and sleep disturbance. The patient is not nervous/anxious.        Objective:   Physical Exam  Constitutional: She is oriented to person, place, and time. She appears well-developed and well-nourished. No distress.  HENT:  Head: Normocephalic and atraumatic.  Right Ear: External ear normal.  Left Ear: External ear normal.  Nose: Nose normal.  Mouth/Throat: Oropharynx is clear and moist. No oropharyngeal exudate.  Eyes: Conjunctivae are normal. Pupils are equal, round, and reactive to light. Right eye exhibits no discharge. Left eye exhibits no discharge. No scleral icterus.  Neck: Normal range of motion. Neck supple. No tracheal deviation present. No thyromegaly present.  Cardiovascular: Normal rate, regular rhythm, normal heart sounds and intact distal pulses.  Exam reveals no gallop and no friction rub.   No murmur heard. Pulmonary/Chest: Effort normal and breath sounds normal. No accessory muscle usage. Not tachypneic. No respiratory distress.  She has no decreased breath sounds. She has no wheezes. She has no rhonchi. She has no rales. She exhibits no tenderness.  Musculoskeletal: Normal range of motion. She exhibits no edema and no tenderness.  Lymphadenopathy:    She has no cervical adenopathy.  Neurological: She is alert and oriented to person, place, and time. No cranial nerve deficit. She exhibits normal muscle tone. Coordination normal.  Skin: Skin is warm and dry. No rash noted. She is not diaphoretic. No erythema. No pallor.  Psychiatric: Her speech is normal and behavior is normal. Judgment and thought content normal. Cognition and memory are normal. She exhibits a depressed mood. She expresses no suicidal ideation.          Assessment & Plan:

## 2013-01-30 NOTE — Assessment & Plan Note (Signed)
Symptomatically doing well with noted improvement in shortness of breath and cough with use of Spiriva. Will continue current medications.

## 2013-01-30 NOTE — Progress Notes (Signed)
Pre-visit discussion using our clinic review tool. No additional management support is needed unless otherwise documented below in the visit note.  

## 2013-01-30 NOTE — Assessment & Plan Note (Signed)
BP Readings from Last 3 Encounters:  01/30/13 122/78  01/05/13 116/76  11/26/12 122/86   BP well controlled on current medications. Will continue.

## 2013-01-30 NOTE — Assessment & Plan Note (Signed)
Lipids well-controlled on atorvastatin 20 mg daily. Will continue.

## 2013-02-03 ENCOUNTER — Telehealth: Payer: Self-pay | Admitting: Internal Medicine

## 2013-02-03 NOTE — Telephone Encounter (Signed)
Relevant patient education mailed to patient.  

## 2013-02-06 ENCOUNTER — Other Ambulatory Visit: Payer: Self-pay | Admitting: *Deleted

## 2013-02-06 DIAGNOSIS — I10 Essential (primary) hypertension: Secondary | ICD-10-CM

## 2013-02-06 DIAGNOSIS — I4891 Unspecified atrial fibrillation: Secondary | ICD-10-CM

## 2013-02-06 DIAGNOSIS — I442 Atrioventricular block, complete: Secondary | ICD-10-CM

## 2013-02-06 DIAGNOSIS — R5383 Other fatigue: Secondary | ICD-10-CM

## 2013-02-09 MED ORDER — ATENOLOL 25 MG PO TABS: 25.0000 mg | ORAL_TABLET | Freq: Every day | ORAL | Status: DC

## 2013-03-04 ENCOUNTER — Ambulatory Visit (INDEPENDENT_AMBULATORY_CARE_PROVIDER_SITE_OTHER): Payer: Medicare Other | Admitting: Pharmacist

## 2013-03-04 DIAGNOSIS — I4891 Unspecified atrial fibrillation: Secondary | ICD-10-CM

## 2013-03-04 DIAGNOSIS — Z7901 Long term (current) use of anticoagulants: Secondary | ICD-10-CM

## 2013-03-04 DIAGNOSIS — Z5181 Encounter for therapeutic drug level monitoring: Secondary | ICD-10-CM

## 2013-03-04 LAB — POCT INR: INR: 2.2

## 2013-04-02 ENCOUNTER — Encounter: Payer: Medicare Other | Admitting: Internal Medicine

## 2013-04-15 ENCOUNTER — Ambulatory Visit (INDEPENDENT_AMBULATORY_CARE_PROVIDER_SITE_OTHER): Payer: Medicare Other

## 2013-04-15 DIAGNOSIS — Z5181 Encounter for therapeutic drug level monitoring: Secondary | ICD-10-CM

## 2013-04-15 DIAGNOSIS — I4891 Unspecified atrial fibrillation: Secondary | ICD-10-CM

## 2013-04-15 DIAGNOSIS — Z7901 Long term (current) use of anticoagulants: Secondary | ICD-10-CM

## 2013-04-15 LAB — POCT INR: INR: 2.1

## 2013-04-15 MED ORDER — WARFARIN SODIUM 5 MG PO TABS: ORAL_TABLET | ORAL | Status: DC

## 2013-04-22 ENCOUNTER — Encounter: Payer: Self-pay | Admitting: *Deleted

## 2013-04-22 ENCOUNTER — Encounter: Payer: Self-pay | Admitting: Internal Medicine

## 2013-04-22 ENCOUNTER — Ambulatory Visit (INDEPENDENT_AMBULATORY_CARE_PROVIDER_SITE_OTHER): Payer: Medicare Other | Admitting: Internal Medicine

## 2013-04-22 ENCOUNTER — Telehealth: Payer: Self-pay | Admitting: Internal Medicine

## 2013-04-22 VITALS — BP 148/80 | HR 64 | Temp 98.1°F | Ht 64.5 in | Wt 155.0 lb

## 2013-04-22 DIAGNOSIS — F329 Major depressive disorder, single episode, unspecified: Secondary | ICD-10-CM

## 2013-04-22 DIAGNOSIS — Z0000 Encounter for general adult medical examination without abnormal findings: Secondary | ICD-10-CM | POA: Insufficient documentation

## 2013-04-22 DIAGNOSIS — I10 Essential (primary) hypertension: Secondary | ICD-10-CM

## 2013-04-22 DIAGNOSIS — F3289 Other specified depressive episodes: Secondary | ICD-10-CM

## 2013-04-22 DIAGNOSIS — F32A Depression, unspecified: Secondary | ICD-10-CM

## 2013-04-22 DIAGNOSIS — Z23 Encounter for immunization: Secondary | ICD-10-CM

## 2013-04-22 LAB — COMPREHENSIVE METABOLIC PANEL
ALT: 15 U/L (ref 0–35)
AST: 19 U/L (ref 0–37)
Albumin: 3.8 g/dL (ref 3.5–5.2)
Alkaline Phosphatase: 70 U/L (ref 39–117)
BUN: 17 mg/dL (ref 6–23)
CO2: 27 mEq/L (ref 19–32)
Calcium: 8.7 mg/dL (ref 8.4–10.5)
Chloride: 107 mEq/L (ref 96–112)
Creatinine, Ser: 1.3 mg/dL — ABNORMAL HIGH (ref 0.4–1.2)
GFR: 42.06 mL/min — ABNORMAL LOW (ref >60.00)
Glucose, Bld: 88 mg/dL (ref 70–99)
Potassium: 4.1 mEq/L (ref 3.5–5.1)
Sodium: 141 mEq/L (ref 135–145)
Total Bilirubin: 0.9 mg/dL (ref 0.3–1.2)
Total Protein: 6.3 g/dL (ref 6.0–8.3)

## 2013-04-22 LAB — HM PAP SMEAR

## 2013-04-22 LAB — HM MAMMOGRAPHY

## 2013-04-22 MED ORDER — PAROXETINE HCL 10 MG PO TABS: 10.0000 mg | ORAL_TABLET | Freq: Every day | ORAL | Status: DC

## 2013-04-22 NOTE — Assessment & Plan Note (Signed)
BP Readings from Last 3 Encounters:  04/22/13 148/80  01/30/13 122/78  01/05/13 116/76   BP generally has been well controlled on current medication. Will continue.

## 2013-04-22 NOTE — Progress Notes (Signed)
Pre visit review using our clinic review tool, if applicable. No additional management support is needed unless otherwise documented below in the visit note. 

## 2013-04-22 NOTE — Assessment & Plan Note (Signed)
General medical exam normal today. Pt declines breast exam, mammogram, pelvic exam and PAP. Colonoscopy completed 2009. Prevnar given today. Appropriate screening performed. Will check CMP with labs today. Follow up 6 months and prn.

## 2013-04-22 NOTE — Telephone Encounter (Signed)
Relevant patient education mailed to patient.  

## 2013-04-22 NOTE — Progress Notes (Signed)
The patient is here for annual Medicare Wellness Examination and management of other chronic and acute problems.   The risk factors are reflected in the history.  The roster of all physicians providing medical care to patient - is listed in the Snapshot section of the chart.  Activities of daily living:   The patient is 100% independent in all ADLs: dressing, toileting, feeding as well as independent mobility. Patient lives with son in one story home with carpeted floors. Had one fall this year on front porch. No injuries.  Home safety :  The patient has smoke detectors in the home.  They wear seatbelts in their car. There are no firearms at home.  There is no violence in the home. They feel safe where they live.  Infectious Risks: There is no risks for hepatitis, STDs or HIV.  There is no  history of blood transfusion.  They have no travel history to infectious disease endemic areas of the world.  Additional Health Care Providers: The patient has seen their dentist in the last six months. Dentist - in [place], Dr. Mancel Bale They have seen their eye doctor in the last year. Opthalmologist - [facility] They deny hearing issues. They have deferred audiologic testing in the last year.   They do not  have excessive sun exposure. Discussed the need for sun protection: hats,long sleeves and use of sunscreen if there is significant sun exposure.  Dermatologist - none  Diet: the importance of a healthy diet is discussed. They do have a healthy diet.  The benefits of regular aerobic exercise were discussed. Pt does not exercise. Has dyspnea with walking. Plans to start walking program.  Depression screen: there are no signs or vegative symptoms of depression- irritability, change in appetite, anhedonia, sadness/tearfullness.  Cognitive assessment: the patient manages all their financial and personal affairs and is actively engaged.   HCPOA - [facility] - yes  The following  portions of the patient's history were reviewed and updated as appropriate: allergies, current medications, past family history, past medical history,  past surgical history, past social history and problem list.  Visual acuity was not assessed per patient preference as they have regular follow up with their ophthalmologist. Hearing and body mass index were assessed and reviewed.   During the course of the visit the patient was educated and counseled about appropriate screening and preventive services including : fall prevention , diabetes screening, nutrition counseling, colorectal cancer screening, and recommended immunizations.    Review of Systems  Constitutional: Negative for fever, chills, appetite change, fatigue and unexpected weight change.  HENT: Negative for congestion, ear pain, sinus pressure, sore throat, trouble swallowing and voice change.   Eyes: Negative for visual disturbance.  Respiratory: Negative for cough, shortness of breath, wheezing and stridor.   Cardiovascular: Negative for chest pain, palpitations and leg swelling.  Gastrointestinal: Negative for nausea, vomiting, abdominal pain, diarrhea, constipation, blood in stool, abdominal distention and anal bleeding.  Genitourinary: Negative for dysuria and flank pain.  Musculoskeletal: Negative for arthralgias, gait problem, myalgias and neck pain.  Skin: Negative for color change and rash.  Neurological: Negative for dizziness and headaches.  Hematological: Negative for adenopathy. Does not bruise/bleed easily.  Psychiatric/Behavioral: Negative for suicidal ideas, sleep disturbance and dysphoric mood. The patient is not nervous/anxious.        Objective:    BP 148/80  Pulse 64  Temp(Src) 98.1 F (36.7 C) (Oral)  Ht 5' 4.5" (1.638 m)  Wt 155 lb (70.308  kg)  BMI 26.20 kg/m2  SpO2 94% Physical Exam  Constitutional: She is oriented to person, place, and time. She appears well-developed and well-nourished. No  distress.  HENT:  Head: Normocephalic and atraumatic.  Right Ear: External ear normal.  Left Ear: External ear normal.  Nose: Nose normal.  Mouth/Throat: Oropharynx is clear and moist. No oropharyngeal exudate.  Eyes: Conjunctivae are normal. Pupils are equal, round, and reactive to light. Right eye exhibits no discharge. Left eye exhibits no discharge. No scleral icterus.  Neck: Normal range of motion. Neck supple. No tracheal deviation present. No thyromegaly present.  Cardiovascular: Normal rate, regular rhythm, normal heart sounds and intact distal pulses.  Exam reveals no gallop and no friction rub.   No murmur heard. Pulmonary/Chest: Effort normal and breath sounds normal. No accessory muscle usage. Not tachypneic. No respiratory distress. She has no decreased breath sounds. She has no wheezes. She has no rhonchi. She has no rales. She exhibits no tenderness.  Abdominal: Soft. Bowel sounds are normal. She exhibits no distension and no mass. There is no tenderness. There is no rebound and no guarding.  Musculoskeletal: Normal range of motion. She exhibits no edema and no tenderness.  Lymphadenopathy:    She has no cervical adenopathy.  Neurological: She is alert and oriented to person, place, and time. No cranial nerve deficit. She exhibits normal muscle tone. Coordination normal.  Skin: Skin is warm and dry. No rash noted. She is not diaphoretic. No erythema. No pallor.  Psychiatric: She has a normal mood and affect. Her behavior is normal. Judgment and thought content normal.          Assessment & Plan:   Problem List Items Addressed This Visit   Depression     Symptoms well controlled on Paxil. Will continue.    Relevant Medications      PARoxetine (PAXIL) tablet   HYPERTENSION, BENIGN      BP Readings from Last 3 Encounters:  04/22/13 148/80  01/30/13 122/78  01/05/13 116/76   BP generally has been well controlled on current medication. Will continue.    Relevant  Orders      Comp Met (CMET)   Medicare annual wellness visit, subsequent - Primary     General medical exam normal today. Pt declines breast exam, mammogram, pelvic exam and PAP. Colonoscopy completed 2009. Prevnar given today. Appropriate screening performed. Will check CMP with labs today. Follow up 6 months and prn.     Other Visit Diagnoses   Need for prophylactic vaccination against Streptococcus pneumoniae (pneumococcus)        Relevant Orders       Pneumococcal conjugate vaccine 13-valent (Completed)        Return in about 6 months (around 10/22/2013) for [place].

## 2013-04-22 NOTE — Assessment & Plan Note (Signed)
Symptoms well controlled on Paxil. Will continue.

## 2013-05-05 ENCOUNTER — Ambulatory Visit (INDEPENDENT_AMBULATORY_CARE_PROVIDER_SITE_OTHER): Payer: Medicare Other | Admitting: *Deleted

## 2013-05-05 DIAGNOSIS — I4891 Unspecified atrial fibrillation: Secondary | ICD-10-CM

## 2013-05-05 DIAGNOSIS — I442 Atrioventricular block, complete: Secondary | ICD-10-CM

## 2013-05-05 LAB — MDC_IDC_ENUM_SESS_TYPE_INCLINIC
Battery Impedance: 1900 Ohm
Battery Voltage: 2.78 V
Date Time Interrogation Session: 20150414141526
Implantable Pulse Generator Model: 5826
Implantable Pulse Generator Serial Number: 1236137
Lead Channel Impedance Value: 368 Ohm
Lead Channel Impedance Value: 453 Ohm
Lead Channel Pacing Threshold Amplitude: 0.5 V
Lead Channel Pacing Threshold Amplitude: 0.75 V
Lead Channel Pacing Threshold Pulse Width: 0.5 ms
Lead Channel Pacing Threshold Pulse Width: 0.5 ms
Lead Channel Sensing Intrinsic Amplitude: 2.2 mV
Lead Channel Sensing Intrinsic Amplitude: 3.8 mV
Lead Channel Setting Pacing Amplitude: 2 V
Lead Channel Setting Pacing Pulse Width: 0.5 ms
Lead Channel Setting Sensing Sensitivity: 2 mV

## 2013-05-05 NOTE — Progress Notes (Signed)
PPM check in office. 

## 2013-05-27 ENCOUNTER — Ambulatory Visit (INDEPENDENT_AMBULATORY_CARE_PROVIDER_SITE_OTHER): Payer: Medicare Other

## 2013-05-27 DIAGNOSIS — I4891 Unspecified atrial fibrillation: Secondary | ICD-10-CM

## 2013-05-27 DIAGNOSIS — Z7901 Long term (current) use of anticoagulants: Secondary | ICD-10-CM

## 2013-05-27 DIAGNOSIS — Z5181 Encounter for therapeutic drug level monitoring: Secondary | ICD-10-CM

## 2013-05-27 LAB — POCT INR: INR: 2.4

## 2013-05-29 ENCOUNTER — Encounter: Payer: Self-pay | Admitting: Internal Medicine

## 2013-06-18 ENCOUNTER — Telehealth: Payer: Self-pay | Admitting: Internal Medicine

## 2013-06-18 NOTE — Telephone Encounter (Signed)
Routed to [place], Dr. Thomes Dinning pt

## 2013-06-18 NOTE — Telephone Encounter (Signed)
Form for Medical Evaluation [facility]. located in physician's box .

## 2013-06-21 ENCOUNTER — Encounter: Payer: Self-pay | Admitting: Internal Medicine

## 2013-06-26 DIAGNOSIS — Z0279 Encounter for issue of other medical certificate: Secondary | ICD-10-CM

## 2013-06-29 NOTE — Telephone Encounter (Signed)
Patient came in and filled out the medical release form it is in Dr. Thomes Dinning box since I am not sure what needs to be done.

## 2013-06-29 NOTE — Telephone Encounter (Signed)
Forms completed and put in folder ready for pick up, notified pt

## 2013-07-08 ENCOUNTER — Ambulatory Visit (INDEPENDENT_AMBULATORY_CARE_PROVIDER_SITE_OTHER): Payer: Medicare Other

## 2013-07-08 DIAGNOSIS — Z7901 Long term (current) use of anticoagulants: Secondary | ICD-10-CM

## 2013-07-08 DIAGNOSIS — I4891 Unspecified atrial fibrillation: Secondary | ICD-10-CM

## 2013-07-08 DIAGNOSIS — Z5181 Encounter for therapeutic drug level monitoring: Secondary | ICD-10-CM

## 2013-07-08 LAB — POCT INR: INR: 2.1

## 2013-08-13 ENCOUNTER — Encounter: Payer: Self-pay | Admitting: Cardiovascular Disease

## 2013-08-13 ENCOUNTER — Ambulatory Visit (INDEPENDENT_AMBULATORY_CARE_PROVIDER_SITE_OTHER): Payer: Medicare Other

## 2013-08-13 ENCOUNTER — Ambulatory Visit (INDEPENDENT_AMBULATORY_CARE_PROVIDER_SITE_OTHER): Payer: Medicare Other | Admitting: Cardiovascular Disease

## 2013-08-13 VITALS — BP 118/78 | HR 71 | Ht 67.0 in | Wt 143.0 lb

## 2013-08-13 DIAGNOSIS — I4891 Unspecified atrial fibrillation: Secondary | ICD-10-CM

## 2013-08-13 DIAGNOSIS — I6529 Occlusion and stenosis of unspecified carotid artery: Secondary | ICD-10-CM

## 2013-08-13 DIAGNOSIS — Z9889 Other specified postprocedural states: Secondary | ICD-10-CM

## 2013-08-13 DIAGNOSIS — Z5181 Encounter for therapeutic drug level monitoring: Secondary | ICD-10-CM

## 2013-08-13 DIAGNOSIS — I442 Atrioventricular block, complete: Secondary | ICD-10-CM

## 2013-08-13 DIAGNOSIS — J438 Other emphysema: Secondary | ICD-10-CM

## 2013-08-13 DIAGNOSIS — H1131 Conjunctival hemorrhage, right eye: Secondary | ICD-10-CM | POA: Insufficient documentation

## 2013-08-13 DIAGNOSIS — H1130 Conjunctival hemorrhage, unspecified eye: Secondary | ICD-10-CM

## 2013-08-13 DIAGNOSIS — Z8679 Personal history of other diseases of the circulatory system: Secondary | ICD-10-CM

## 2013-08-13 DIAGNOSIS — E785 Hyperlipidemia, unspecified: Secondary | ICD-10-CM

## 2013-08-13 DIAGNOSIS — Z7901 Long term (current) use of anticoagulants: Secondary | ICD-10-CM

## 2013-08-13 LAB — POCT INR: INR: 3.7

## 2013-08-13 NOTE — Assessment & Plan Note (Signed)
Cholesterol is at goal on the current lipid regimen. No changes to the medications were made.  

## 2013-08-13 NOTE — Assessment & Plan Note (Signed)
Status post pacemaker, followed by Dr. Caryl Comes

## 2013-08-13 NOTE — Assessment & Plan Note (Signed)
Mild carotid arterial disease and 2011. Will continue aggressive cholesterol management. Goal LDL less than 70

## 2013-08-13 NOTE — Progress Notes (Signed)
Patient ID: Jessica [name], female    DOB: 1929-12-11, 80 y.o.   MRN: [id number]  HPI Comments: Jessica [name] is a very pleasant 80 year old woman with a history of peripheral vascular disease, status post open abdominal aortic aneurysm repair in 2009,  long smoking history who stopped >15 years ago , history of complete heart block status post pacemaker implant, herna repair in 2009 with post op atrial fib, HTN, prior TIA per the notes, carotid u/s showing mild carotid disease in 2011 with atypical vessel on the left contributing to her bruit, previous pains in her  neck radiating to her arms bilaterally occurring at night and awakening her from sleep, relieved by repositioning herself in bed. She presents for routine follow up.  She has COPD, 50 years of smoking. She reports that she is doing well.  She reports that she is moving to a nursing home tomorrow, [place]. She does report some balance instability, worse with climbing stairs. She does not do any regular exercise. She does stay active performing her own ADLs. She denies any shortness of breath, chest pain. No significant palpitations.  She is tolerating Lipitor 20 mg daily chronic pain in her back She has chronic shortness of breath with exertion. She takes Spiriva sometimes, not on a regular basis as it is expensive, not taking Symbicort  She rubs her eyes frequently. Suffered a conjunctival hemorrhage yesterday  Last Myoview October 2012 showing no ischemia   EKG shows Paced rhythm at rate of 71 beats per minute    Outpatient Encounter Prescriptions as of 78/23/2015  Medication Sig  . albuterol (PROAIR HFA) 108 (90 BASE) MCG/ACT inhaler Inhale 2 puffs into the lungs every 6 (six) hours as needed for wheezing or shortness of breath.  Marland Kitchen atenolol (TENORMIN) 25 MG tablet Take 1 tablet (25 mg total) by mouth daily.  Marland Kitchen atorvastatin (LIPITOR) 40 MG tablet Take 0.5 tablets (20 mg total) by mouth daily.  . hydrochlorothiazide  (HYDRODIURIL) 25 MG tablet Take one tablet by mouth daily as needed for swelling  . vitamin E 400 UNIT capsule Take 400 Units by mouth daily.  Marland Kitchen warfarin (COUMADIN) 5 MG tablet Take as directed by anticoagulation clinic    Review of Systems  Constitutional: Negative.   HENT: Negative.   Eyes: Negative.   Respiratory: Negative.   Cardiovascular: Negative.   Gastrointestinal: Negative.   Endocrine: Negative.   Musculoskeletal: Positive for back pain and gait problem.  Skin: Negative.   Allergic/Immunologic: Negative.   Neurological: Negative.   Hematological: Negative.   Psychiatric/Behavioral: Negative.   All other systems reviewed and are negative.   BP 118/78  Pulse 71  Ht 5\' 7"  (1.702 m)  Wt 143 lb (64.864 kg)  BMI 22.39 kg/m2  Physical Exam  Nursing note and vitals reviewed. Constitutional: She is oriented to person, place, and time. She appears well-developed and well-nourished.  HENT:  Head: Normocephalic.  Nose: Nose normal.  Mouth/Throat: Oropharynx is clear and moist.  Eyes: Conjunctivae are normal. Pupils are equal, round, and reactive to light.  Neck: Normal range of motion. Neck supple. No JVD present. Carotid bruit is present.  Cardiovascular: Normal rate, regular rhythm, S1 normal, S2 normal and intact distal pulses.  Exam reveals no gallop and no friction rub.   Murmur heard.  Crescendo systolic murmur is present with a grade of 2/6  Pulmonary/Chest: Effort normal and breath sounds normal. No respiratory distress. She has no wheezes. She has no rales. She exhibits no tenderness.  Abdominal: Soft. Bowel sounds are normal. She exhibits no distension. There is no tenderness.  Musculoskeletal: Normal range of motion. She exhibits no edema and no tenderness.  Lymphadenopathy:    She has no cervical adenopathy.  Neurological: She is alert and oriented to person, place, and time. Coordination normal.  Skin: Skin is warm and dry. No rash noted. No erythema.   Psychiatric: She has a normal mood and affect. Her behavior is normal. Judgment and thought content normal.    Assessment and Plan

## 2013-08-13 NOTE — Assessment & Plan Note (Signed)
We will place an order for repeat abdominal aortic ultrasound, prior surgery in 2009

## 2013-08-13 NOTE — Patient Instructions (Signed)
You are doing well. No medication changes were made.  We will schedule an aorta ultrasound for hx of AAA repair  Please call us if you have new issues that need to be addressed before your next appt.  Your physician wants you to follow-up in: 12 months.  You will receive a reminder letter in the mail two months in advance. If you don't receive a letter, please call our office to schedule the follow-up appointment.

## 2013-08-13 NOTE — Assessment & Plan Note (Signed)
Followed by Dr. Lake Bells. Using albuterol, Spiriva sometimes. Finds it is very expensive. Encouraged her to start a regular walking program. Unable to afford pulmonary rehabilitation

## 2013-08-13 NOTE — Assessment & Plan Note (Signed)
Heart rate well controlled, on anticoagulation

## 2013-08-13 NOTE — Assessment & Plan Note (Signed)
She rubs her eyes frequently. In the setting of INR greater than 3, this likely caused her conjunctival hemorrhage. Otherwise she is asymptomatic

## 2013-08-13 NOTE — Assessment & Plan Note (Signed)
INR greater than 3. Will be adjusted today.

## 2013-08-26 ENCOUNTER — Ambulatory Visit (INDEPENDENT_AMBULATORY_CARE_PROVIDER_SITE_OTHER): Payer: Medicare Other

## 2013-08-26 DIAGNOSIS — I4891 Unspecified atrial fibrillation: Secondary | ICD-10-CM

## 2013-08-26 DIAGNOSIS — Z5181 Encounter for therapeutic drug level monitoring: Secondary | ICD-10-CM

## 2013-08-26 DIAGNOSIS — R609 Edema, unspecified: Secondary | ICD-10-CM

## 2013-08-26 LAB — POCT INR: INR: 6

## 2013-08-26 MED ORDER — HYDROCHLOROTHIAZIDE 25 MG PO TABS: ORAL_TABLET | ORAL | Status: DC

## 2013-09-03 ENCOUNTER — Ambulatory Visit (INDEPENDENT_AMBULATORY_CARE_PROVIDER_SITE_OTHER): Payer: Medicare Other

## 2013-09-03 ENCOUNTER — Encounter (INDEPENDENT_AMBULATORY_CARE_PROVIDER_SITE_OTHER): Payer: Medicare Other

## 2013-09-03 DIAGNOSIS — Z9889 Other specified postprocedural states: Secondary | ICD-10-CM

## 2013-09-03 DIAGNOSIS — Z5181 Encounter for therapeutic drug level monitoring: Secondary | ICD-10-CM

## 2013-09-03 DIAGNOSIS — I4891 Unspecified atrial fibrillation: Secondary | ICD-10-CM

## 2013-09-03 DIAGNOSIS — I700 Atherosclerosis of aorta: Secondary | ICD-10-CM

## 2013-09-03 DIAGNOSIS — Z8679 Personal history of other diseases of the circulatory system: Secondary | ICD-10-CM

## 2013-09-03 DIAGNOSIS — Z7901 Long term (current) use of anticoagulants: Secondary | ICD-10-CM

## 2013-09-03 LAB — POCT INR: INR: 1.5

## 2013-09-09 ENCOUNTER — Ambulatory Visit (INDEPENDENT_AMBULATORY_CARE_PROVIDER_SITE_OTHER): Payer: Medicare Other

## 2013-09-09 DIAGNOSIS — Z5181 Encounter for therapeutic drug level monitoring: Secondary | ICD-10-CM

## 2013-09-09 DIAGNOSIS — I4891 Unspecified atrial fibrillation: Secondary | ICD-10-CM

## 2013-09-09 DIAGNOSIS — Z7901 Long term (current) use of anticoagulants: Secondary | ICD-10-CM

## 2013-09-09 LAB — POCT INR: INR: 1.8

## 2013-09-17 ENCOUNTER — Encounter: Payer: Self-pay | Admitting: Pulmonary Disease

## 2013-09-17 ENCOUNTER — Ambulatory Visit (INDEPENDENT_AMBULATORY_CARE_PROVIDER_SITE_OTHER): Payer: Medicare Other | Admitting: Pulmonary Disease

## 2013-09-17 VITALS — BP 112/68 | HR 71 | Ht 67.0 in | Wt 151.0 lb

## 2013-09-17 DIAGNOSIS — J438 Other emphysema: Secondary | ICD-10-CM

## 2013-09-17 MED ORDER — TIOTROPIUM BROMIDE MONOHYDRATE 18 MCG IN CAPS: 18.0000 ug | ORAL_CAPSULE | Freq: Every day | RESPIRATORY_TRACT | Status: DC

## 2013-09-17 NOTE — Progress Notes (Signed)
  Subjective:    Patient ID: Jessica [name], female    DOB: 27-Oct-1929, 80 y.o.   MRN: [id number]  Synopsis: Ms. Alberts first saw the [facility] clinic in 09/2011 for GOLD grade C COPD.  She smoked until 2001.  In 2013 she had spirometry which showed ratio 53%, FEV1 0.97 L (44% predicted) and an MMRC score of 1.  HPI   09/17/2013 ROV > Jessica [name] says that she seems to have more trouble with her breathing when she leaves the house.  She has a hard time breathing with exertion.  When she is in the grocery store she does OK, but when she is outside she has trouble.  She says that she does well at the beach and on out of state trips.  She is no longer taking the Spiriva because she can't afford it.  She did better on Spiriva  Past Medical History  Diagnosis Date  . Atrioventricular block, complete   . Cardiac pacemaker in situ   . Abdominal aneurysm without mention of rupture   . Hernia   . AAA (abdominal aortic aneurysm)   . Hypertension   . Hyperlipidemia      Review of Systems  Constitutional: Negative for fever, chills and fatigue.  HENT: Negative for congestion and sinus pressure.   Respiratory: Positive for shortness of breath. Negative for cough and wheezing.   Cardiovascular: Negative for chest pain, palpitations and leg swelling.       Objective:   Physical Exam  Filed Vitals:   09/17/13 1427  BP: 112/68  Pulse: 71  Height: 5\' 7"  (1.702 m)  Weight: 151 lb (68.493 kg)  SpO2: 96%   RA  Gen: well appearing, no acute distress HEENT: NCAT, EOMi, OP clear PULM: CTA B CV: RRR, no mgr, no JVD AB: BS+, soft, nontender, no hsm Ext: warm, no edema, no clubbing, no cyanosis      Assessment & Plan:   COPD (chronic obstructive pulmonary disease) Sahira really needs to get back on her Spiriva because she does well when taking it.  She needs financial assistance to help afford it.  Otherwise there have been no major changes since the last visit.  Plan: -flu shot  in the fall -restart Spiriva -financial assistance for Spiriva -f/u 6 months    Updated Medication List Outpatient Encounter Prescriptions as of 09/17/2013  Medication Sig  . albuterol (PROAIR HFA) 108 (90 BASE) MCG/ACT inhaler Inhale 2 puffs into the lungs every 6 (six) hours as needed for wheezing or shortness of breath.  Marland Kitchen atenolol (TENORMIN) 25 MG tablet Take 1 tablet (25 mg total) by mouth daily.  Marland Kitchen atorvastatin (LIPITOR) 40 MG tablet Take 0.5 tablets (20 mg total) by mouth daily.  . hydrochlorothiazide (HYDRODIURIL) 25 MG tablet Take one tablet by mouth daily as needed for swelling  . vitamin E 400 UNIT capsule Take 400 Units by mouth daily.  Marland Kitchen warfarin (COUMADIN) 5 MG tablet Take as directed by anticoagulation clinic

## 2013-09-17 NOTE — Patient Instructions (Signed)
Take the Spiriva daily no matter how you feel. We will apply for financial assistance for the Spiriva We will see you back in 6 months or sooner if needed

## 2013-09-17 NOTE — Assessment & Plan Note (Signed)
Jessica [name] really needs to get back on her Spiriva because she does well when taking it.  She needs financial assistance to help afford it.  Otherwise there have been no major changes since the last visit.  Plan: -flu shot in the fall -restart Spiriva -financial assistance for Spiriva -f/u 6 months

## 2013-09-23 ENCOUNTER — Ambulatory Visit (INDEPENDENT_AMBULATORY_CARE_PROVIDER_SITE_OTHER): Payer: Medicare Other

## 2013-09-23 DIAGNOSIS — Z7901 Long term (current) use of anticoagulants: Secondary | ICD-10-CM

## 2013-09-23 DIAGNOSIS — Z5181 Encounter for therapeutic drug level monitoring: Secondary | ICD-10-CM

## 2013-09-23 DIAGNOSIS — I4891 Unspecified atrial fibrillation: Secondary | ICD-10-CM

## 2013-09-23 LAB — POCT INR: INR: 1.8

## 2013-09-24 ENCOUNTER — Telehealth: Payer: Self-pay | Admitting: Cardiology

## 2013-09-25 NOTE — Telephone Encounter (Signed)
Error

## 2013-10-07 ENCOUNTER — Ambulatory Visit (INDEPENDENT_AMBULATORY_CARE_PROVIDER_SITE_OTHER): Payer: Medicare Other

## 2013-10-07 DIAGNOSIS — I4891 Unspecified atrial fibrillation: Secondary | ICD-10-CM

## 2013-10-07 DIAGNOSIS — Z5181 Encounter for therapeutic drug level monitoring: Secondary | ICD-10-CM

## 2013-10-07 DIAGNOSIS — Z7901 Long term (current) use of anticoagulants: Secondary | ICD-10-CM

## 2013-10-07 LAB — POCT INR: INR: 2.1

## 2013-11-04 ENCOUNTER — Ambulatory Visit (INDEPENDENT_AMBULATORY_CARE_PROVIDER_SITE_OTHER): Payer: Medicare Other

## 2013-11-04 DIAGNOSIS — Z5181 Encounter for therapeutic drug level monitoring: Secondary | ICD-10-CM

## 2013-11-04 DIAGNOSIS — Z7901 Long term (current) use of anticoagulants: Secondary | ICD-10-CM

## 2013-11-04 DIAGNOSIS — I4891 Unspecified atrial fibrillation: Secondary | ICD-10-CM

## 2013-11-04 LAB — POCT INR: INR: 3

## 2013-11-10 ENCOUNTER — Encounter: Payer: Medicare Other | Admitting: Internal Medicine

## 2013-12-02 ENCOUNTER — Ambulatory Visit (INDEPENDENT_AMBULATORY_CARE_PROVIDER_SITE_OTHER): Payer: Medicare Other | Admitting: Pharmacist

## 2013-12-02 DIAGNOSIS — Z7901 Long term (current) use of anticoagulants: Secondary | ICD-10-CM

## 2013-12-02 DIAGNOSIS — Z5181 Encounter for therapeutic drug level monitoring: Secondary | ICD-10-CM

## 2013-12-02 DIAGNOSIS — I4891 Unspecified atrial fibrillation: Secondary | ICD-10-CM

## 2013-12-02 LAB — POCT INR: INR: 1.8

## 2013-12-08 ENCOUNTER — Encounter: Payer: Self-pay | Admitting: Internal Medicine

## 2013-12-08 ENCOUNTER — Ambulatory Visit (INDEPENDENT_AMBULATORY_CARE_PROVIDER_SITE_OTHER): Payer: Medicare Other | Admitting: Internal Medicine

## 2013-12-08 VITALS — BP 130/84 | HR 67 | Ht 67.0 in | Wt 141.0 lb

## 2013-12-08 DIAGNOSIS — I480 Paroxysmal atrial fibrillation: Secondary | ICD-10-CM

## 2013-12-08 DIAGNOSIS — Z45018 Encounter for adjustment and management of other part of cardiac pacemaker: Secondary | ICD-10-CM

## 2013-12-08 DIAGNOSIS — I442 Atrioventricular block, complete: Secondary | ICD-10-CM

## 2013-12-08 LAB — MDC_IDC_ENUM_SESS_TYPE_INCLINIC
Battery Impedance: 2300 Ohm
Battery Voltage: 2.76 V
Brady Statistic RA Percent Paced: 74 %
Brady Statistic RV Percent Paced: 97 %
Date Time Interrogation Session: 20151117134816
Implantable Pulse Generator Model: 5826
Implantable Pulse Generator Serial Number: 1236137
Lead Channel Impedance Value: 375 Ohm
Lead Channel Impedance Value: 471 Ohm
Lead Channel Pacing Threshold Amplitude: 0.5 V
Lead Channel Pacing Threshold Amplitude: 0.75 V
Lead Channel Pacing Threshold Pulse Width: 0.4 ms
Lead Channel Pacing Threshold Pulse Width: 0.5 ms
Lead Channel Sensing Intrinsic Amplitude: 1.7 mV
Lead Channel Sensing Intrinsic Amplitude: 3.9 mV
Lead Channel Setting Pacing Amplitude: 2 V
Lead Channel Setting Pacing Pulse Width: 0.5 ms
Lead Channel Setting Sensing Sensitivity: 2 mV

## 2013-12-08 NOTE — Progress Notes (Signed)
Patient Care Team: Jackolyn Confer, MD as PCP - General (Internal Medicine) Deboraha Sprang, MD (Cardiology) Minna Merritts, MD (Cardiology)   HPI  Jessica [name] is a 80 y.o. female seen in followup for a pacemaker implanted for complete heart block now status post device generator replacement.  She also has a history of paroxysmal atrial fibrillation and is on Coumadin. She has a CHADS VASC score of 6 ; she is a prior TIA. She has had no recurrent palpitations.  She has chronic shortness of breath. We undertook stress testing with [email or web address]   was negative for ischemia  Catheterization is in the remote past   She's had no problems with chest pain. Shortness of breath is stable. There is no edema.    Past Medical History  Diagnosis Date  . Atrioventricular block, complete   . Cardiac pacemaker in situ   . Abdominal aneurysm without mention of rupture   . Hernia   . AAA (abdominal aortic aneurysm)   . Hypertension   . Hyperlipidemia     Past Surgical History  Procedure Laterality Date  . Ohter      growth on colon surgery  . Tonsillectomy    . Abdominal aortic aneurysm repair    . Hernia repair      Current Outpatient Prescriptions  Medication Sig Dispense Refill  . albuterol (PROAIR HFA) 108 (90 BASE) MCG/ACT inhaler Inhale 2 puffs into the lungs every 6 (six) hours as needed for wheezing or shortness of breath. 1 Inhaler 5  . atenolol (TENORMIN) 25 MG tablet Take 1 tablet (25 mg total) by mouth daily. 180 tablet 3  . atorvastatin (LIPITOR) 40 MG tablet Take 0.5 tablets (20 mg total) by mouth daily. 90 tablet 3  . hydrochlorothiazide (HYDRODIURIL) 25 MG tablet Take one tablet by mouth daily as needed for swelling 30 tablet 3  . tiotropium (SPIRIVA HANDIHALER) 18 MCG inhalation capsule Place 1 capsule (18 mcg total) into inhaler and inhale daily. 30 capsule 5  . vitamin E 400 UNIT capsule Take 400 Units by mouth daily.    Marland Kitchen warfarin (COUMADIN) 5 MG tablet  Take as directed by anticoagulation clinic 100 tablet 1   No current facility-administered medications for this visit.    Allergies  Allergen Reactions  . Codeine     Made her feel crazy  . Morphine     Made her feel crazy  . Zyrtec [Cetirizine]     Causes facial numbness    Review of Systems negative except from HPI and PMH  Physical Exam BP 130/84 mmHg  Pulse 67  Ht 5\' 7"  (1.702 m)  Wt 141 lb (63.957 kg)  BMI 22.08 kg/m2 Well developed and well nourished in no acute distress HENT normal E scleral and icterus clear Neck Supple Clear to ausculation  Regular rate and rhythm, no murmurs gallops or rub Soft with active bowel sounds No clubbing cyanosis no  Edema Alert and oriented, grossly normal motor and sensory function Skin Warm and Dry  ECG demonstrates  Intermittent atrial pacing with some ventricular pacing and pseudofusion. Intervals 22/16/44  Assessment and  Plan  Atrial fibrillation-paroxysmal  AV block-complete -intermittent  Pacemaker-St. Jude The patient's device was interrogated.  The information was reviewed. No changes were made in the programming.     The patient is stable. COPD is her major limitation. We discussed NOACs as an alternative to warfarin. At this point she would like to continue on her warfarin  although she was told that under her new insurance proprietary drugs will be significantly cheaper.

## 2013-12-08 NOTE — Patient Instructions (Signed)
Your physician wants you to follow-up in: 6 months with Dr. Klein. You will receive a reminder letter in the mail two months in advance. If you don't receive a letter, please call our office to schedule the follow-up appointment.  Your physician recommends that you continue on your current medications as directed. Please refer to the Current Medication list given to you today.  

## 2013-12-15 ENCOUNTER — Emergency Department: Payer: Self-pay | Admitting: Emergency Medicine

## 2013-12-15 LAB — PROTIME-INR
INR: 2.2
Prothrombin Time: 23.8 secs — ABNORMAL HIGH (ref 11.5–14.7)

## 2013-12-15 LAB — BASIC METABOLIC PANEL
Anion Gap: 10 (ref 7–16)
BUN: 26 mg/dL — ABNORMAL HIGH (ref 7–18)
Calcium, Total: 9.3 mg/dL (ref 8.5–10.1)
Chloride: 103 mmol/L (ref 98–107)
Co2: 28 mmol/L (ref 21–32)
Creatinine: 1.4 mg/dL — ABNORMAL HIGH (ref 0.60–1.30)
EGFR (African American): 46 — ABNORMAL LOW
EGFR (Non-African Amer.): 38 — ABNORMAL LOW
Glucose: 115 mg/dL — ABNORMAL HIGH (ref 65–99)
Osmolality: 287 (ref 275–301)
Potassium: 3.1 mmol/L — ABNORMAL LOW (ref 3.5–5.1)
Sodium: 141 mmol/L (ref 136–145)

## 2013-12-15 LAB — CBC
HCT: 42.2 % (ref 35.0–47.0)
HGB: 14 g/dL (ref 12.0–16.0)
MCH: 31.5 pg (ref 26.0–34.0)
MCHC: 33.2 g/dL (ref 32.0–36.0)
MCV: 95 fL (ref 80–100)
Platelet: 136 10*3/uL — ABNORMAL LOW (ref 150–440)
RBC: 4.44 10*6/uL (ref 3.80–5.20)
RDW: 15.2 % — ABNORMAL HIGH (ref 11.5–14.5)
WBC: 7.5 10*3/uL (ref 3.6–11.0)

## 2013-12-30 ENCOUNTER — Ambulatory Visit (INDEPENDENT_AMBULATORY_CARE_PROVIDER_SITE_OTHER): Payer: Medicare Other

## 2013-12-30 DIAGNOSIS — Z5181 Encounter for therapeutic drug level monitoring: Secondary | ICD-10-CM

## 2013-12-30 DIAGNOSIS — I4891 Unspecified atrial fibrillation: Secondary | ICD-10-CM

## 2013-12-30 DIAGNOSIS — Z7901 Long term (current) use of anticoagulants: Secondary | ICD-10-CM

## 2013-12-30 LAB — POCT INR: INR: 1.7

## 2014-01-20 ENCOUNTER — Ambulatory Visit (INDEPENDENT_AMBULATORY_CARE_PROVIDER_SITE_OTHER): Payer: Medicare Other

## 2014-01-20 DIAGNOSIS — Z7901 Long term (current) use of anticoagulants: Secondary | ICD-10-CM

## 2014-01-20 DIAGNOSIS — I4891 Unspecified atrial fibrillation: Secondary | ICD-10-CM

## 2014-01-20 DIAGNOSIS — Z5181 Encounter for therapeutic drug level monitoring: Secondary | ICD-10-CM

## 2014-01-20 LAB — POCT INR: INR: 3.6

## 2014-02-03 ENCOUNTER — Ambulatory Visit (INDEPENDENT_AMBULATORY_CARE_PROVIDER_SITE_OTHER): Payer: PPO

## 2014-02-03 DIAGNOSIS — Z5181 Encounter for therapeutic drug level monitoring: Secondary | ICD-10-CM

## 2014-02-03 DIAGNOSIS — Z7901 Long term (current) use of anticoagulants: Secondary | ICD-10-CM

## 2014-02-03 DIAGNOSIS — I4891 Unspecified atrial fibrillation: Secondary | ICD-10-CM

## 2014-02-03 LAB — POCT INR: INR: 4.1

## 2014-02-10 ENCOUNTER — Other Ambulatory Visit: Payer: Self-pay | Admitting: *Deleted

## 2014-02-10 DIAGNOSIS — I10 Essential (primary) hypertension: Secondary | ICD-10-CM

## 2014-02-10 DIAGNOSIS — I4891 Unspecified atrial fibrillation: Secondary | ICD-10-CM

## 2014-02-10 DIAGNOSIS — I442 Atrioventricular block, complete: Secondary | ICD-10-CM

## 2014-02-10 MED ORDER — ATENOLOL 25 MG PO TABS: 25.0000 mg | ORAL_TABLET | Freq: Every day | ORAL | Status: DC

## 2014-02-17 ENCOUNTER — Ambulatory Visit (INDEPENDENT_AMBULATORY_CARE_PROVIDER_SITE_OTHER): Payer: PPO | Admitting: Pharmacist

## 2014-02-17 DIAGNOSIS — Z5181 Encounter for therapeutic drug level monitoring: Secondary | ICD-10-CM

## 2014-02-17 DIAGNOSIS — I4891 Unspecified atrial fibrillation: Secondary | ICD-10-CM

## 2014-02-17 DIAGNOSIS — Z7901 Long term (current) use of anticoagulants: Secondary | ICD-10-CM

## 2014-02-17 LAB — POCT INR: INR: 2.3

## 2014-03-10 ENCOUNTER — Ambulatory Visit (INDEPENDENT_AMBULATORY_CARE_PROVIDER_SITE_OTHER): Payer: PPO

## 2014-03-10 DIAGNOSIS — I4891 Unspecified atrial fibrillation: Secondary | ICD-10-CM

## 2014-03-10 DIAGNOSIS — Z7901 Long term (current) use of anticoagulants: Secondary | ICD-10-CM

## 2014-03-10 DIAGNOSIS — Z5181 Encounter for therapeutic drug level monitoring: Secondary | ICD-10-CM

## 2014-03-10 LAB — POCT INR: INR: 2.4

## 2014-03-12 ENCOUNTER — Other Ambulatory Visit: Payer: Self-pay | Admitting: *Deleted

## 2014-03-12 MED ORDER — WARFARIN SODIUM 5 MG PO TABS: ORAL_TABLET | ORAL | Status: DC

## 2014-04-14 ENCOUNTER — Ambulatory Visit (INDEPENDENT_AMBULATORY_CARE_PROVIDER_SITE_OTHER): Payer: PPO

## 2014-04-14 DIAGNOSIS — Z5181 Encounter for therapeutic drug level monitoring: Secondary | ICD-10-CM

## 2014-04-14 DIAGNOSIS — Z7901 Long term (current) use of anticoagulants: Secondary | ICD-10-CM | POA: Diagnosis not present

## 2014-04-14 DIAGNOSIS — I4891 Unspecified atrial fibrillation: Secondary | ICD-10-CM

## 2014-04-14 LAB — POCT INR: INR: 1.8

## 2014-05-12 ENCOUNTER — Ambulatory Visit (INDEPENDENT_AMBULATORY_CARE_PROVIDER_SITE_OTHER): Payer: PPO

## 2014-05-12 ENCOUNTER — Ambulatory Visit (INDEPENDENT_AMBULATORY_CARE_PROVIDER_SITE_OTHER): Payer: PPO | Admitting: Pulmonary Disease

## 2014-05-12 ENCOUNTER — Encounter: Payer: Self-pay | Admitting: Pulmonary Disease

## 2014-05-12 VITALS — BP 134/70 | HR 70 | Ht 67.0 in | Wt 143.0 lb

## 2014-05-12 DIAGNOSIS — J438 Other emphysema: Secondary | ICD-10-CM

## 2014-05-12 DIAGNOSIS — Z7901 Long term (current) use of anticoagulants: Secondary | ICD-10-CM | POA: Diagnosis not present

## 2014-05-12 DIAGNOSIS — I4891 Unspecified atrial fibrillation: Secondary | ICD-10-CM | POA: Diagnosis not present

## 2014-05-12 DIAGNOSIS — Z5181 Encounter for therapeutic drug level monitoring: Secondary | ICD-10-CM

## 2014-05-12 DIAGNOSIS — R0602 Shortness of breath: Secondary | ICD-10-CM | POA: Diagnosis not present

## 2014-05-12 LAB — POCT INR: INR: 2.1

## 2014-05-12 NOTE — Progress Notes (Signed)
Subjective:    Patient ID: [name], female    DOB: 13-Jan-1930, 81 y.o.   MRN: [id number]  Synopsis: Ms. Kelty first saw the [facility] clinic in 09/2011 for GOLD grade C COPD.  She smoked until 2001.  In 2013 she had spirometry which showed ratio 53%, FEV1 0.97 L (44% predicted) and an MMRC score of 1.  HPI  Chief Complaint  Patient presents with  . Follow-up    pt c/o some prod cough with light yellow mucus since Friday.  Denies sinus congestion, chest pain.     Lorraine says her dyspnea has ben worse lately.  She has been trying to stay active with exercise every day but she still has trouble.  She says taht she gets chest tightness.  She continues to take Spiriva which helps.  She is out of albuterol which helps when she takes it.  She takes the albuterol about every other day.  She sometimes takes it before. She thinks that she may be getting out of shape because she didn't stay too active over the winter. No new chest pain but she notes back and arm pain that only happens at rest, never with exercise.  She has been coughing more in the last four days.  She feels like she had a cold, had some achiness with this.    Past Medical History  Diagnosis Date  . Atrioventricular block, complete   . Cardiac pacemaker in situ   . Abdominal aneurysm without mention of rupture   . Hernia   . AAA (abdominal aortic aneurysm)   . Hypertension   . Hyperlipidemia      Review of Systems  Constitutional: Negative for fever, chills and fatigue.  HENT: Negative for congestion and sinus pressure.   Respiratory: Positive for shortness of breath. Negative for cough and wheezing.   Cardiovascular: Negative for chest pain, palpitations and leg swelling.       Objective:   Physical Exam Filed Vitals:   05/12/14 1331  BP: 134/70  Pulse: 70  Height: 5\' 7"  (1.702 m)  Weight: 143 lb (64.864 kg)  SpO2: 94%   RA  Gen: well appearing HENT: OP clear, TM's clear, neck supple PULM:  Mild wheezing, normal respiratory effort, normal percussion CV: RRR, slight systolic murmur, trace edema GI: BS+, soft, nontender Derm: no cyanosis or rash Psyche: normal mood and affect  November 2014 chest x-ray reviewed today clinic> emphysema bilaterally, mild mucus plugging versus slight atelectasis in the lingula, pacemaker placed The most recent office notes from her cardiologist were reviewed in clinic and it looks that her A. fib has been stable with no changes needed as of November 2015    Assessment & Plan:   COPD (chronic obstructive pulmonary disease) This has been worsening recently and she has more wheezing on exam today. There is nothing in her history since suggested new environmental exposure. My presumption is that this is the natural history of COPD worsening.  Plan:  -we will check a chest x-ray to ensure that there is no other etiology for her shortness of breath -Add Advair twice a day, samples given she will call if it helped so we can start a prescription -Continue Spiriva -Renewed albuterol, advised to use prior to exercise   Shortness of breath As noted above this has been worse recently. We will check a chest x-ray to ensure that there is no other etiology. Her lungs have wheezing on exam some presumption is this is related  to her emphysema and COPD.     Updated Medication List Outpatient Encounter Prescriptions as of 05/12/2014  Medication Sig  . albuterol (PROAIR HFA) 108 (90 BASE) MCG/ACT inhaler Inhale 2 puffs into the lungs every 6 (six) hours as needed for wheezing or shortness of breath.  Marland Kitchen atenolol (TENORMIN) 25 MG tablet Take 1 tablet (25 mg total) by mouth daily.  Marland Kitchen atorvastatin (LIPITOR) 40 MG tablet Take 0.5 tablets (20 mg total) by mouth daily.  . hydrochlorothiazide (HYDRODIURIL) 25 MG tablet Take one tablet by mouth daily as needed for swelling  . tiotropium (SPIRIVA HANDIHALER) 18 MCG inhalation capsule Place 1 capsule (18 mcg total) into  inhaler and inhale daily.  . vitamin E 400 UNIT capsule Take 400 Units by mouth daily.  Marland Kitchen warfarin (COUMADIN) 5 MG tablet Take as directed by anticoagulation clinic

## 2014-05-12 NOTE — Assessment & Plan Note (Signed)
This has been worsening recently and she has more wheezing on exam today. There is nothing in her history since suggested new environmental exposure. My presumption is that this is the natural history of COPD worsening.  Plan:  -we will check a chest x-ray to ensure that there is no other etiology for her shortness of breath -Add Advair twice a day, samples given she will call if it helped so we can start a prescription -Continue Spiriva -Renewed albuterol, advised to use prior to exercise

## 2014-05-12 NOTE — Patient Instructions (Signed)
Take the Advair twice a day in addition to the Spiriva. Call us and let us know if it helps so we can send in a prescription Go get a chest x-ray, we have ordered this for you and we'll call you with the results -Exercise regularly -Use albuterol prior to exercise We will see you back in 3 months or sooner if needed

## 2014-05-12 NOTE — Assessment & Plan Note (Signed)
As noted above this has been worse recently. We will check a chest x-ray to ensure that there is no other etiology. Her lungs have wheezing on exam some presumption is this is related to her emphysema and COPD.

## 2014-05-13 ENCOUNTER — Telehealth: Payer: Self-pay | Admitting: Pulmonary Disease

## 2014-05-13 ENCOUNTER — Ambulatory Visit (INDEPENDENT_AMBULATORY_CARE_PROVIDER_SITE_OTHER)
Admission: RE | Admit: 2014-05-13 | Discharge: 2014-05-13 | Disposition: A | Discharge status: Home / Self Care | Payer: PPO | Source: Ambulatory Visit | Attending: Pulmonary Disease | Admitting: Pulmonary Disease

## 2014-05-13 DIAGNOSIS — J438 Other emphysema: Secondary | ICD-10-CM

## 2014-05-13 MED ORDER — ALBUTEROL SULFATE HFA 108 (90 BASE) MCG/ACT IN AERS: 2.0000 | INHALATION_SPRAY | Freq: Four times a day (QID) | RESPIRATORY_TRACT | Status: DC | PRN

## 2014-05-13 NOTE — Telephone Encounter (Signed)
Refill of Proair called in to pharmacy.  Pharmacy will notify patient when ready for pick up. Nothing further needed.

## 2014-05-17 ENCOUNTER — Other Ambulatory Visit: Payer: Self-pay

## 2014-05-17 MED ORDER — FLUTICASONE-SALMETEROL 100-50 MCG/DOSE IN AEPB: 1.0000 | INHALATION_SPRAY | Freq: Two times a day (BID) | RESPIRATORY_TRACT | Status: DC

## 2014-06-09 ENCOUNTER — Other Ambulatory Visit: Payer: Self-pay

## 2014-06-09 ENCOUNTER — Ambulatory Visit (INDEPENDENT_AMBULATORY_CARE_PROVIDER_SITE_OTHER): Payer: PPO

## 2014-06-09 DIAGNOSIS — Z5181 Encounter for therapeutic drug level monitoring: Secondary | ICD-10-CM

## 2014-06-09 DIAGNOSIS — E785 Hyperlipidemia, unspecified: Secondary | ICD-10-CM

## 2014-06-09 DIAGNOSIS — R609 Edema, unspecified: Secondary | ICD-10-CM

## 2014-06-09 DIAGNOSIS — I4891 Unspecified atrial fibrillation: Secondary | ICD-10-CM

## 2014-06-09 DIAGNOSIS — Z7901 Long term (current) use of anticoagulants: Secondary | ICD-10-CM | POA: Diagnosis not present

## 2014-06-09 LAB — POCT INR: INR: 1.8

## 2014-06-09 MED ORDER — WARFARIN SODIUM 5 MG PO TABS: ORAL_TABLET | ORAL | Status: DC

## 2014-06-09 MED ORDER — HYDROCHLOROTHIAZIDE 25 MG PO TABS: ORAL_TABLET | ORAL | Status: DC

## 2014-06-09 MED ORDER — ATORVASTATIN CALCIUM 40 MG PO TABS: 20.0000 mg | ORAL_TABLET | Freq: Every day | ORAL | Status: DC

## 2014-06-09 NOTE — Telephone Encounter (Signed)
Rx sent to local pharmacy for HCTZ and Atorvastatin.

## 2014-06-09 NOTE — Telephone Encounter (Signed)
Pt seen in Coumadin clinic requesting refills on HCTZ and Lipitor be sent to [facility].  Thanks

## 2014-06-30 ENCOUNTER — Ambulatory Visit (INDEPENDENT_AMBULATORY_CARE_PROVIDER_SITE_OTHER): Payer: PPO

## 2014-06-30 DIAGNOSIS — Z7901 Long term (current) use of anticoagulants: Secondary | ICD-10-CM

## 2014-06-30 DIAGNOSIS — Z5181 Encounter for therapeutic drug level monitoring: Secondary | ICD-10-CM

## 2014-06-30 DIAGNOSIS — I4891 Unspecified atrial fibrillation: Secondary | ICD-10-CM | POA: Diagnosis not present

## 2014-06-30 LAB — POCT INR: INR: 2.2

## 2014-07-20 ENCOUNTER — Ambulatory Visit (INDEPENDENT_AMBULATORY_CARE_PROVIDER_SITE_OTHER): Payer: PPO | Admitting: Internal Medicine

## 2014-07-20 ENCOUNTER — Encounter: Payer: Self-pay | Admitting: Internal Medicine

## 2014-07-20 VITALS — BP 102/70 | HR 71 | Ht 66.0 in | Wt 143.2 lb

## 2014-07-20 DIAGNOSIS — I4891 Unspecified atrial fibrillation: Secondary | ICD-10-CM

## 2014-07-20 DIAGNOSIS — Z950 Presence of cardiac pacemaker: Secondary | ICD-10-CM | POA: Diagnosis not present

## 2014-07-20 DIAGNOSIS — I442 Atrioventricular block, complete: Secondary | ICD-10-CM | POA: Diagnosis not present

## 2014-07-20 LAB — CUP PACEART INCLINIC DEVICE CHECK
Battery Voltage: 2.76 V
Brady Statistic RA Percent Paced: 78 %
Brady Statistic RV Percent Paced: 98 %
Date Time Interrogation Session: 20160628121911
Lead Channel Impedance Value: 406 Ohm
Lead Channel Impedance Value: 492 Ohm
Lead Channel Pacing Threshold Amplitude: 0.75 V
Lead Channel Pacing Threshold Pulse Width: 0.5 ms
Lead Channel Sensing Intrinsic Amplitude: 0.9 mV
Lead Channel Sensing Intrinsic Amplitude: 4.8 mV
Lead Channel Setting Pacing Amplitude: 2 V
Lead Channel Setting Pacing Pulse Width: 0.5 ms
Lead Channel Setting Sensing Sensitivity: 2 mV
Pulse Gen Model: 5826
Pulse Gen Serial Number: 1236137

## 2014-07-20 NOTE — Patient Instructions (Addendum)
No medication Changes were made today. Your physician recommends that you schedule a follow-up appointment in 1 yr with Dr. Caryl Comes.

## 2014-07-20 NOTE — Progress Notes (Signed)
Patient Care Team: Jackolyn Confer, MD as PCP - General (Internal Medicine) Deboraha Sprang, MD (Cardiology) Minna Merritts, MD (Cardiology)   HPI  Jessica [name] is a 81 y.o. female seen in followup for a pacemaker implanted for complete heart block now status post device generator replacement.  She also has a history of paroxysmal atrial fibrillation and is on Coumadin. She has a CHADS VASC score of 6 ; she is a prior TIA. She has had no recurrent palpitations.  She's had no problems with chest pain. Shortness of breath is stable. There is no edema. She is largely without complaint  Past Medical History  Diagnosis Date  . Atrioventricular block, complete   . Cardiac pacemaker in situ   . Abdominal aneurysm without mention of rupture   . Hernia   . AAA (abdominal aortic aneurysm)   . Hypertension   . Hyperlipidemia     Past Surgical History  Procedure Laterality Date  . Ohter      growth on colon surgery  . Tonsillectomy    . Abdominal aortic aneurysm repair    . Hernia repair      Current Outpatient Prescriptions  Medication Sig Dispense Refill  . albuterol (PROAIR HFA) 108 (90 BASE) MCG/ACT inhaler Inhale 2 puffs into the lungs every 6 (six) hours as needed for wheezing or shortness of breath. 1 Inhaler 5  . atenolol (TENORMIN) 25 MG tablet Take 1 tablet (25 mg total) by mouth daily. 180 tablet 3  . Fluticasone-Salmeterol (ADVAIR DISKUS) 100-50 MCG/DOSE AEPB Inhale 1 puff into the lungs 2 (two) times daily. 60 each 5  . hydrochlorothiazide (HYDRODIURIL) 25 MG tablet Take one tablet by mouth daily as needed for swelling 30 tablet 3  . tiotropium (SPIRIVA HANDIHALER) 18 MCG inhalation capsule Place 1 capsule (18 mcg total) into inhaler and inhale daily. 30 capsule 5  . warfarin (COUMADIN) 5 MG tablet Take as directed by anticoagulation clinic 100 tablet 1   No current facility-administered medications for this visit.    Allergies  Allergen Reactions  .  Codeine     Made her feel crazy  . Morphine     Made her feel crazy  . Zyrtec [Cetirizine]     Causes facial numbness    Review of Systems negative except from HPI and PMH  Physical Exam BP 102/70 mmHg  Pulse 71  Ht 5\' 6"  (1.676 m)  Wt 143 lb 4 oz (64.978 kg)  BMI 23.13 kg/m2 Well developed and well nourished in no acute distress HENT normal E scleral and icterus clear Neck Supple Clear to ausculation  Regular rate and rhythm, no murmurs gallops or rub Soft with active bowel sounds No clubbing cyanosis no  Edema Alert and oriented, grossly normal motor and sensory function Skin Warm and Dry  ECG demonstrates  Intermittent atrial pacing with some ventricular pacing and pseudofusion. Intervals 22/16/44  Assessment and  Plan  Atrial fibrillation-paroxysmal   hypertension  AV block-complete -intermittent  Pacemaker-St. Jude The patient's device was interrogated.  The information was reviewed. No changes were made in the programming.     We discussed the use of the NOACs compared to Coumadin. We briefly reviewed the data of at least comparability in stroke prevention, bleeding and outcome. We discussed some of the new once wherein somewhat associated with decreased ischemic stroke risk, one to be taken daily, and has been shown to be comparable and bleeding risk to aspirin.  We also  discussed bleeding associated with warfarin as well as NOACs and a wall bleeding as a complication of all these drugs intracranial bleeding is more frequently associated with warfarin then the NOACs and a GI bleeding is more commonly associated with the latter  Hypertension well controlled  She will plan to check the prices with her pharmacist and discuss this when she sees Dr. Deidre Ala  Last creatinine was 1.4   11/15.   r.

## 2014-07-27 ENCOUNTER — Encounter: Payer: Self-pay | Admitting: Internal Medicine

## 2014-08-02 ENCOUNTER — Encounter: Payer: Self-pay | Admitting: Internal Medicine

## 2014-08-04 ENCOUNTER — Ambulatory Visit (INDEPENDENT_AMBULATORY_CARE_PROVIDER_SITE_OTHER): Payer: PPO | Admitting: *Deleted

## 2014-08-04 DIAGNOSIS — Z5181 Encounter for therapeutic drug level monitoring: Secondary | ICD-10-CM | POA: Diagnosis not present

## 2014-08-04 DIAGNOSIS — I4891 Unspecified atrial fibrillation: Secondary | ICD-10-CM | POA: Diagnosis not present

## 2014-08-04 DIAGNOSIS — Z7901 Long term (current) use of anticoagulants: Secondary | ICD-10-CM

## 2014-08-04 LAB — POCT INR: INR: 2.4

## 2014-08-11 ENCOUNTER — Encounter: Payer: Self-pay | Admitting: Internal Medicine

## 2014-08-17 ENCOUNTER — Ambulatory Visit (INDEPENDENT_AMBULATORY_CARE_PROVIDER_SITE_OTHER): Payer: PPO | Admitting: Cardiovascular Disease

## 2014-08-17 ENCOUNTER — Encounter: Payer: Self-pay | Admitting: Cardiovascular Disease

## 2014-08-17 VITALS — BP 112/80 | HR 62 | Ht 67.0 in | Wt 144.5 lb

## 2014-08-17 DIAGNOSIS — I10 Essential (primary) hypertension: Secondary | ICD-10-CM

## 2014-08-17 DIAGNOSIS — I4891 Unspecified atrial fibrillation: Secondary | ICD-10-CM | POA: Diagnosis not present

## 2014-08-17 DIAGNOSIS — E785 Hyperlipidemia, unspecified: Secondary | ICD-10-CM

## 2014-08-17 DIAGNOSIS — J438 Other emphysema: Secondary | ICD-10-CM

## 2014-08-17 DIAGNOSIS — Z950 Presence of cardiac pacemaker: Secondary | ICD-10-CM

## 2014-08-17 DIAGNOSIS — I70309 Unspecified atherosclerosis of unspecified type of bypass graft(s) of the extremities, unspecified extremity: Secondary | ICD-10-CM

## 2014-08-17 NOTE — Assessment & Plan Note (Signed)
BP stable, will hold HCTZ She is taking this QOD Will recheck BMP today (potassium)

## 2014-08-17 NOTE — Progress Notes (Signed)
Patient ID: Jessica [name], female    DOB: 19-Mar-1929, 81 y.o.   MRN: [id number]  HPI Comments: [name] is a very pleasant 81 year old woman with a history of peripheral vascular disease, status post open abdominal aortic aneurysm repair in 2009,  long smoking history who stopped >15 years ago , history of complete heart block status post pacemaker implant, herna repair in 2009 with post op atrial fib, HTN, prior TIA per the notes, carotid u/s showing mild carotid disease in 2011 with atypical vessel on the left contributing to her bruit, previous pains in her neck radiating to her arms bilaterally occurring at night and awakening her from sleep, relieved by repositioning herself in bed. She presents for routine follow up of her PAD he has COPD, 50 years of smoking.  She reports that she is doing well. She is living at the [facility],  Takes HCTZ every other day Reports having a problem with smell and taste, deficiency over the past 8 months, getting worse. She has not discussed this with primary care Walks her dog daily,  some balance instability, worse with climbing stairs. She does not do any regular exercise. She does stay active performing her own ADLs. She denies any  chest pain. No significant palpitations. Chronic mild shortness of breath with exertion chronic pain in her back  EKG on today's visit shows paced rhythm with rate 62 bpm  Other past medical history Last Myoview October 2012 showing no ischemia     Allergies  Allergen Reactions  . Codeine     Made her feel crazy  . Morphine     Made her feel crazy  . Zyrtec [Cetirizine]     Causes facial numbness    Current Outpatient Prescriptions on File Prior to Visit  Medication Sig Dispense Refill  . albuterol (PROAIR HFA) 108 (90 BASE) MCG/ACT inhaler Inhale 2 puffs into the lungs every 6 (six) hours as needed for wheezing or shortness of breath. 1 Inhaler 5  . atenolol (TENORMIN) 25 MG tablet Take 1 tablet (25 mg total) by  mouth daily. 180 tablet 3  . Fluticasone-Salmeterol (ADVAIR DISKUS) 100-50 MCG/DOSE AEPB Inhale 1 puff into the lungs 2 (two) times daily. 60 each 5  . hydrochlorothiazide (HYDRODIURIL) 25 MG tablet Take one tablet by mouth daily as needed for swelling 30 tablet 3  . tiotropium (SPIRIVA HANDIHALER) 18 MCG inhalation capsule Place 1 capsule (18 mcg total) into inhaler and inhale daily. (Patient taking differently: Place 18 mcg into inhaler and inhale as needed. ) 30 capsule 5  . warfarin (COUMADIN) 5 MG tablet Take as directed by anticoagulation clinic 100 tablet 1   No current facility-administered medications on file prior to visit.    Past Medical History  Diagnosis Date  . Atrioventricular block, complete   . Cardiac pacemaker in situ   . Abdominal aneurysm without mention of rupture   . Hernia   . AAA (abdominal aortic aneurysm)   . Hypertension   . Hyperlipidemia     Past Surgical History  Procedure Laterality Date  . Ohter      growth on colon surgery  . Tonsillectomy    . Abdominal aortic aneurysm repair    . Hernia repair      Social History  reports that she quit smoking about 18 years ago. Her smoking use included Cigarettes. She has a 75 pack-year smoking history. She has never used smokeless tobacco. She reports that she drinks alcohol. She reports that she does  not use illicit drugs.  Family History family history includes Alcohol abuse in her father; Cancer in her brother; Heart disease in her father.   Review of Systems  Constitutional: Negative.   Eyes: Negative.   Respiratory: Negative.   Cardiovascular: Negative.   Gastrointestinal: Negative.   Musculoskeletal: Negative.   Neurological: Negative.   Hematological: Negative.   Psychiatric/Behavioral: Negative.   All other systems reviewed and are negative.   BP 112/80 mmHg  Pulse 62  Ht 5\' 7"  (1.702 m)  Wt 144 lb 8 oz (65.545 kg)  BMI 22.63 kg/m2  Physical Exam  Constitutional: She is oriented  to person, place, and time. She appears well-developed and well-nourished.  HENT:  Head: Normocephalic.  Nose: Nose normal.  Mouth/Throat: Oropharynx is clear and moist.  Eyes: Conjunctivae are normal. Pupils are equal, round, and reactive to light.  Neck: Normal range of motion. Neck supple. No JVD present. Carotid bruit is present.  Cardiovascular: Normal rate, regular rhythm, S1 normal, S2 normal and intact distal pulses.  Exam reveals no gallop and no friction rub.   Murmur heard.  Crescendo systolic murmur is present with a grade of 2/6  Pulmonary/Chest: Effort normal and breath sounds normal. No respiratory distress. She has no wheezes. She has no rales. She exhibits no tenderness.  Abdominal: Soft. Bowel sounds are normal. She exhibits no distension. There is no tenderness.  Musculoskeletal: Normal range of motion. She exhibits no edema or tenderness.  Lymphadenopathy:    She has no cervical adenopathy.  Neurological: She is alert and oriented to person, place, and time. Coordination normal.  Skin: Skin is warm and dry. No rash noted. No erythema.  Psychiatric: She has a normal mood and affect. Her behavior is normal. Judgment and thought content normal.    Assessment and Plan  Nursing note and vitals reviewed.

## 2014-08-17 NOTE — Assessment & Plan Note (Signed)
Annual ultrasound scheduled later this year

## 2014-08-17 NOTE — Assessment & Plan Note (Signed)
Currently not on a statin. No recent lab work available

## 2014-08-17 NOTE — Assessment & Plan Note (Signed)
On warfarin, paced rhythm. No new symptoms She has follow-up with EP

## 2014-08-17 NOTE — Patient Instructions (Addendum)
You are doing well. No medication changes were made.  We will check labs today,  BMP today  Take HCTZ only for ankle swelling  Please call us if you have new issues that need to be addressed before your next appt.  Your physician wants you to follow-up in: 12 months.  You will receive a reminder letter in the mail two months in advance. If you don't receive a letter, please call our office to schedule the follow-up appointment.

## 2014-08-17 NOTE — Assessment & Plan Note (Signed)
Stable respiratory status, currently on inhalers

## 2014-08-17 NOTE — Assessment & Plan Note (Signed)
She has follow-up with Dr. Caryl Comes. Appears to be functioning well

## 2014-08-18 LAB — BASIC METABOLIC PANEL
BUN/Creatinine Ratio: 14 (ref 11–26)
BUN: 19 mg/dL (ref 8–27)
CO2: 27 mmol/L (ref 18–29)
Calcium: 9.2 mg/dL (ref 8.7–10.3)
Chloride: 97 mmol/L (ref 97–108)
Creatinine, Ser: 1.32 mg/dL — ABNORMAL HIGH (ref 0.57–1.00)
GFR calc Af Amer: 43 mL/min/{1.73_m2} — ABNORMAL LOW (ref >59)
GFR calc non Af Amer: 37 mL/min/{1.73_m2} — ABNORMAL LOW (ref >59)
Glucose: 99 mg/dL (ref 65–99)
Potassium: 4 mmol/L (ref 3.5–5.2)
Sodium: 141 mmol/L (ref 134–144)

## 2014-08-31 ENCOUNTER — Encounter: Payer: Self-pay | Admitting: *Deleted

## 2014-09-01 ENCOUNTER — Ambulatory Visit (INDEPENDENT_AMBULATORY_CARE_PROVIDER_SITE_OTHER): Payer: PPO | Admitting: *Deleted

## 2014-09-01 DIAGNOSIS — Z5181 Encounter for therapeutic drug level monitoring: Secondary | ICD-10-CM | POA: Diagnosis not present

## 2014-09-01 DIAGNOSIS — I4891 Unspecified atrial fibrillation: Secondary | ICD-10-CM | POA: Diagnosis not present

## 2014-09-01 DIAGNOSIS — Z7901 Long term (current) use of anticoagulants: Secondary | ICD-10-CM

## 2014-09-01 LAB — POCT INR: INR: 2.2

## 2014-09-06 ENCOUNTER — Ambulatory Visit (INDEPENDENT_AMBULATORY_CARE_PROVIDER_SITE_OTHER): Payer: PPO | Admitting: Internal Medicine

## 2014-09-06 ENCOUNTER — Encounter: Payer: Self-pay | Admitting: Internal Medicine

## 2014-09-06 VITALS — BP 148/77 | HR 70 | Temp 97.6°F | Ht 64.5 in | Wt 146.0 lb

## 2014-09-06 DIAGNOSIS — R195 Other fecal abnormalities: Secondary | ICD-10-CM

## 2014-09-06 DIAGNOSIS — K5900 Constipation, unspecified: Secondary | ICD-10-CM

## 2014-09-06 DIAGNOSIS — R194 Change in bowel habit: Secondary | ICD-10-CM | POA: Diagnosis not present

## 2014-09-06 LAB — COMPREHENSIVE METABOLIC PANEL
ALT: 11 U/L (ref 0–35)
AST: 14 U/L (ref 0–37)
Albumin: 4.3 g/dL (ref 3.5–5.2)
Alkaline Phosphatase: 87 U/L (ref 39–117)
BUN: 21 mg/dL (ref 6–23)
CO2: 29 mEq/L (ref 19–32)
Calcium: 9.6 mg/dL (ref 8.4–10.5)
Chloride: 104 mEq/L (ref 96–112)
Creatinine, Ser: 1.26 mg/dL — ABNORMAL HIGH (ref 0.40–1.20)
GFR: 43.07 mL/min — ABNORMAL LOW (ref >60.00)
Glucose, Bld: 99 mg/dL (ref 70–99)
Potassium: 4.1 mEq/L (ref 3.5–5.1)
Sodium: 140 mEq/L (ref 135–145)
Total Bilirubin: 0.7 mg/dL (ref 0.2–1.2)
Total Protein: 7.1 g/dL (ref 6.0–8.3)

## 2014-09-06 NOTE — Patient Instructions (Signed)
Blood work today to check kidney function.  We will set up a CT scan of your abdomen to evaluate your symptoms.  Consider starting a Probiotic.  Try using 17gm of Miralax mixed in a beverage daily to help with constipation.

## 2014-09-06 NOTE — Progress Notes (Signed)
Pre visit review using our clinic review tool, if applicable. No additional management support is needed unless otherwise documented below in the visit note. 

## 2014-09-06 NOTE — Assessment & Plan Note (Signed)
Recent constipation with change in stool caliber. Question if she may have obstruction from previous surgery. Will get CT for evaluation. Start Miralax. Start Probiotic. Follow up in 2 weeks.

## 2014-09-06 NOTE — Progress Notes (Signed)
Subjective:    Patient ID: Jessica [name], female    DOB: October 10, 1929, 81 y.o.   MRN: [id number]  HPI  81YO female presents for acute visit.  Last seen by me in 04/2013.  Constipation/Diarrhea - Alternating between constipation and diarrhea. Had previous history of ventral hernia repair and partial colectomy 2009. Notes that stools are very small which is a change for her. Stools are not hard generally. Increased water and added prune juice. No abdominal pain. No NV. No weight loss. No blood in stool. Feels bloated at times.    Past Medical History  Diagnosis Date  . Atrioventricular block, complete   . Cardiac pacemaker in situ   . Abdominal aneurysm without mention of rupture   . Hernia   . AAA (abdominal aortic aneurysm)   . Hypertension   . Hyperlipidemia    Family History  Problem Relation Age of Onset  . Heart disease Father   . Alcohol abuse Father   . Cancer Brother    Past Surgical History  Procedure Laterality Date  . Ohter      growth on colon surgery  . Tonsillectomy    . Abdominal aortic aneurysm repair    . Hernia repair     Social History   Social History  . Marital Status: Widowed    Spouse Name: N/A  . Number of Children: 2  . Years of Education: N/A   Occupational History  . Retired    Social History Main Topics  . Smoking status: Former Smoker -- 1.50 packs/day for 50 years    Types: Cigarettes    Quit date: 10/03/1995  . Smokeless tobacco: Never Used  . Alcohol Use: Yes     Comment: occasionally  . Drug Use: No  . Sexual Activity: Not Asked   Other Topics Concern  . None   Social History Narrative   Born in [place]. Lives in [place]. Son lives nearby.      No regular exercise     Review of Systems  Constitutional: Negative for fever, chills, appetite change, fatigue and unexpected weight change.  Eyes: Negative for visual disturbance.  Respiratory: Negative for shortness of breath.   Cardiovascular: Negative for chest pain and  leg swelling.  Gastrointestinal: Positive for diarrhea, constipation and abdominal distention. Negative for nausea, vomiting, abdominal pain and blood in stool.  Skin: Negative for color change and rash.  Hematological: Negative for adenopathy. Does not bruise/bleed easily.  Psychiatric/Behavioral: Negative for dysphoric mood. The patient is not nervous/anxious.        Objective:    BP 148/77 mmHg  Pulse 70  Temp(Src) 97.6 F (36.4 C) (Oral)  Ht 5' 4.5" (1.638 m)  Wt 146 lb (66.225 kg)  BMI 24.68 kg/m2  SpO2 98% Physical Exam  Constitutional: She is oriented to person, place, and time. She appears well-developed and well-nourished. No distress.  HENT:  Head: Normocephalic and atraumatic.  Right Ear: External ear normal.  Left Ear: External ear normal.  Nose: Nose normal.  Mouth/Throat: Oropharynx is clear and moist. No oropharyngeal exudate.  Eyes: Conjunctivae and EOM are normal. Pupils are equal, round, and reactive to light. Right eye exhibits no discharge.  Neck: Normal range of motion. Neck supple. No thyromegaly present.  Cardiovascular: Normal rate, regular rhythm, normal heart sounds and intact distal pulses.  Exam reveals no gallop and no friction rub.   No murmur heard. Pulmonary/Chest: Effort normal. No respiratory distress. She has no wheezes. She has no rales.  Abdominal: Soft. Bowel sounds are normal. She exhibits no distension and no mass. There is no tenderness. There is no rebound and no guarding.  Musculoskeletal: Normal range of motion. She exhibits no edema or tenderness.  Lymphadenopathy:    She has no cervical adenopathy.  Neurological: She is alert and oriented to person, place, and time. No cranial nerve deficit. Coordination normal.  Skin: Skin is warm and dry. No rash noted. She is not diaphoretic. No erythema. No pallor.  Psychiatric: She has a normal mood and affect. Her behavior is normal. Judgment and thought content normal.            Assessment & Plan:   Problem List Items Addressed This Visit      Unprioritized   Change in stool caliber    Small stool caliber and constipation. Question obstruction from scar tissue. Will get CT for evaluation, given age and risks of colonoscopy.      CN (constipation) - Primary    Recent constipation with change in stool caliber. Question if she may have obstruction from previous surgery. Will get CT for evaluation. Start Miralax. Start Probiotic. Follow up in 2 weeks.      Relevant Orders   CT Abdomen Pelvis W Contrast   Comprehensive metabolic panel       Return in about 2 weeks (around 09/20/2014) for Recheck.

## 2014-09-06 NOTE — Assessment & Plan Note (Signed)
Small stool caliber and constipation. Question obstruction from scar tissue. Will get CT for evaluation, given age and risks of colonoscopy.

## 2014-09-09 ENCOUNTER — Ambulatory Visit
Admission: RE | Admit: 2014-09-09 | Discharge: 2014-09-09 | Disposition: A | Discharge status: Home / Self Care | Payer: PPO | Source: Ambulatory Visit | Attending: Internal Medicine | Admitting: Internal Medicine

## 2014-09-09 DIAGNOSIS — I722 Aneurysm of renal artery: Secondary | ICD-10-CM | POA: Insufficient documentation

## 2014-09-09 DIAGNOSIS — K5730 Diverticulosis of large intestine without perforation or abscess without bleeding: Secondary | ICD-10-CM | POA: Insufficient documentation

## 2014-09-09 DIAGNOSIS — K5900 Constipation, unspecified: Secondary | ICD-10-CM | POA: Insufficient documentation

## 2014-09-09 DIAGNOSIS — I700 Atherosclerosis of aorta: Secondary | ICD-10-CM | POA: Diagnosis not present

## 2014-09-09 DIAGNOSIS — K449 Diaphragmatic hernia without obstruction or gangrene: Secondary | ICD-10-CM | POA: Diagnosis not present

## 2014-09-09 MED ORDER — IOHEXOL 300 MG/ML  SOLN
100.0000 mL | Freq: Once | INTRAMUSCULAR | Status: AC | PRN
  Administered 2014-09-09: 80 mL via INTRAVENOUS

## 2014-09-15 ENCOUNTER — Encounter: Payer: Self-pay | Admitting: Internal Medicine

## 2014-09-15 ENCOUNTER — Ambulatory Visit (INDEPENDENT_AMBULATORY_CARE_PROVIDER_SITE_OTHER): Payer: PPO | Admitting: Internal Medicine

## 2014-09-15 VITALS — BP 158/79 | HR 70 | Temp 97.4°F | Ht 64.5 in | Wt 147.0 lb

## 2014-09-15 DIAGNOSIS — I722 Aneurysm of renal artery: Secondary | ICD-10-CM

## 2014-09-15 DIAGNOSIS — R195 Other fecal abnormalities: Secondary | ICD-10-CM

## 2014-09-15 DIAGNOSIS — I701 Atherosclerosis of renal artery: Secondary | ICD-10-CM

## 2014-09-15 DIAGNOSIS — R194 Change in bowel habit: Secondary | ICD-10-CM | POA: Diagnosis not present

## 2014-09-15 NOTE — Assessment & Plan Note (Signed)
Noted on CT abdomen. Will set up follow up with Dr. Kellie Simmering in Vascular Surgery. Question if any intervention would be helpful.

## 2014-09-15 NOTE — Assessment & Plan Note (Signed)
Left sided renal artery aneurysm. Will set up follow up with Dr. Kellie Simmering in Vascular surgery.

## 2014-09-15 NOTE — Progress Notes (Signed)
Subjective:    Patient ID: Jessica [name], female    DOB: 1929/10/28, 81 y.o.   MRN: [id number]  HPI  81YO female presents with her son to go over results of recent CT abdomen.  She reports that she continues to have some stools of smaller caliber, however some stools are more normal in size. She denies constipation, black stool, blood in stool. She denies abdominal pain. No change in appetite.  CT abdomen shows no acute cause for previous abdominal pain, or change in stool caliber. Diverticulosis was noted, but no diverticulitis.  The CT did show bilateral renal artery stenosis and a left sided renal artery aneurysm. She has a history of abdominal aortic aneurysm, and has been followed by Dr. Kellie Simmering in vascular surgery. She was not aware of any issues with the renal arteries.   Past Medical History  Diagnosis Date  . Atrioventricular block, complete   . Cardiac pacemaker in situ   . Abdominal aneurysm without mention of rupture   . Hernia   . AAA (abdominal aortic aneurysm)   . Hypertension   . Hyperlipidemia    Family History  Problem Relation Age of Onset  . Heart disease Father   . Alcohol abuse Father   . Cancer Brother    Past Surgical History  Procedure Laterality Date  . Ohter      growth on colon surgery  . Tonsillectomy    . Abdominal aortic aneurysm repair    . Hernia repair     Social History   Social History  . Marital Status: Widowed    Spouse Name: N/A  . Number of Children: 2  . Years of Education: N/A   Occupational History  . Retired    Social History Main Topics  . Smoking status: Former Smoker -- 1.50 packs/day for 50 years    Types: Cigarettes    Quit date: 10/03/1995  . Smokeless tobacco: Never Used  . Alcohol Use: Yes     Comment: occasionally  . Drug Use: No  . Sexual Activity: Not Asked   Other Topics Concern  . None   Social History Narrative   Born in [place]. Lives in [place]. Son lives nearby.      No regular exercise      Review of Systems  Constitutional: Negative for fever, chills, appetite change, fatigue and unexpected weight change.  Eyes: Negative for visual disturbance.  Respiratory: Negative for shortness of breath.   Cardiovascular: Negative for chest pain and leg swelling.  Gastrointestinal: Negative for nausea, vomiting, abdominal pain, diarrhea, constipation, blood in stool, abdominal distention, anal bleeding and rectal pain.  Genitourinary: Negative for difficulty urinating.  Skin: Negative for color change and rash.  Hematological: Negative for adenopathy. Does not bruise/bleed easily.  Psychiatric/Behavioral: Negative for dysphoric mood. The patient is not nervous/anxious.        Objective:    BP 158/79 mmHg  Pulse 70  Temp(Src) 97.4 F (36.3 C) (Oral)  Ht 5' 4.5" (1.638 m)  Wt 147 lb (66.679 kg)  BMI 24.85 kg/m2  SpO2 95% Physical Exam  Constitutional: She is oriented to person, place, and time. She appears well-developed and well-nourished. No distress.  HENT:  Head: Normocephalic and atraumatic.  Right Ear: External ear normal.  Left Ear: External ear normal.  Nose: Nose normal.  Mouth/Throat: Oropharynx is clear and moist.  Eyes: Conjunctivae are normal. Pupils are equal, round, and reactive to light. Right eye exhibits no discharge. Left eye exhibits no discharge.  No scleral icterus.  Neck: Normal range of motion. Neck supple. No tracheal deviation present. No thyromegaly present.  Pulmonary/Chest: Effort normal. No accessory muscle usage. No tachypnea. She has no decreased breath sounds. She has no rhonchi.  Musculoskeletal: Normal range of motion. She exhibits no edema or tenderness.  Lymphadenopathy:    She has no cervical adenopathy.  Neurological: She is alert and oriented to person, place, and time. No cranial nerve deficit. She exhibits normal muscle tone. Coordination normal.  Skin: Skin is warm and dry. No rash noted. She is not diaphoretic. No erythema. No  pallor.  Psychiatric: She has a normal mood and affect. Her behavior is normal. Judgment and thought content normal.          Assessment & Plan:  Over 39min of which >50% spent in face-to-face contact discussing plan of care with patient and her son.  Problem List Items Addressed This Visit      Unprioritized   Change in stool caliber    CT abdomen showed no abnormalities to explain constipation or change in stool caliber. Her description of stool caliber today, sounds normal, however given some ongoing concern, will set up follow up with Dr. Olevia Perches. Question if flex sig might be helpful if persistent concerns.      Relevant Orders   Ambulatory referral to Gastroenterology   Renal arterial aneurysm    Left sided renal artery aneurysm. Will set up follow up with Dr. Kellie Simmering in Vascular surgery.      Relevant Orders   Ambulatory referral to Vascular Surgery   Renal artery stenosis - Primary    Noted on CT abdomen. Will set up follow up with Dr. Kellie Simmering in Vascular Surgery. Question if any intervention would be helpful.      Relevant Orders   Ambulatory referral to Vascular Surgery       Return in about 3 months (around 12/16/2014) for Recheck.

## 2014-09-15 NOTE — Assessment & Plan Note (Signed)
CT abdomen showed no abnormalities to explain constipation or change in stool caliber. Her description of stool caliber today, sounds normal, however given some ongoing concern, will set up follow up with Dr. Olevia Perches. Question if flex sig might be helpful if persistent concerns.

## 2014-09-15 NOTE — Progress Notes (Signed)
Pre visit review using our clinic review tool, if applicable. No additional management support is needed unless otherwise documented below in the visit note. 

## 2014-09-15 NOTE — Patient Instructions (Signed)
We will set up evaluation with Dr. Kellie Simmering and Dr. Olevia Perches.  Follow up here in 3 months and as needed.

## 2014-09-16 ENCOUNTER — Encounter: Payer: Self-pay | Admitting: Gastroenterology

## 2014-10-13 ENCOUNTER — Ambulatory Visit (INDEPENDENT_AMBULATORY_CARE_PROVIDER_SITE_OTHER): Payer: PPO

## 2014-10-13 DIAGNOSIS — Z5181 Encounter for therapeutic drug level monitoring: Secondary | ICD-10-CM

## 2014-10-13 DIAGNOSIS — Z7901 Long term (current) use of anticoagulants: Secondary | ICD-10-CM | POA: Diagnosis not present

## 2014-10-13 DIAGNOSIS — I4891 Unspecified atrial fibrillation: Secondary | ICD-10-CM

## 2014-10-13 LAB — POCT INR: INR: 2.5

## 2014-10-22 ENCOUNTER — Encounter: Payer: Self-pay | Admitting: Vascular Surgery

## 2014-10-26 ENCOUNTER — Encounter: Payer: Self-pay | Admitting: Vascular Surgery

## 2014-10-26 ENCOUNTER — Ambulatory Visit (INDEPENDENT_AMBULATORY_CARE_PROVIDER_SITE_OTHER): Payer: PPO | Admitting: Vascular Surgery

## 2014-10-26 VITALS — BP 146/82 | HR 70 | Temp 98.5°F | Resp 14 | Ht 67.0 in | Wt 146.0 lb

## 2014-10-26 DIAGNOSIS — I722 Aneurysm of renal artery: Secondary | ICD-10-CM | POA: Diagnosis not present

## 2014-10-26 NOTE — Addendum Note (Signed)
Addended by: Dorthula Rue L on: 10/26/2014 03:47 PM   Modules accepted: Orders

## 2014-10-26 NOTE — Progress Notes (Signed)
Subjective:     Patient ID: Jessica [name], female   DOB: Jun 29, 1929, 81 y.o.   MRN: [id number]  aortobiiliac graft. There was a 19 x 17 mm left renal artery aneurysm HPI This 81 year old female was referred by Dr. Ronette Deter for evaluation of left renal artery aneurysm which was recently discovered on CT scan. Patient is known to me having performed resection and grafting of abdominal aortic aneurysm in 2009. 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 Alden Benjamin. Patient has done well from this. She did develop a ventral hernia which was later treated. Recently a CT scan was performed because of some abdominal discomfort and patient was found to have a widely patent  aortobiiliac graft and a 19 x 17 mm left renal artery aneurysm which was partially thrombosed. There is the appearance of stenosis at the distal end of the aneurysm. There was also stenosis at the origin of the right renal artery. Patient has had  No worsening renal function with recent creatinine of 1.3 to. Patient also has had well controlled low-pressure on one medication.   Past Medical History  Diagnosis Date  . Atrioventricular block, complete ([place])   . Cardiac pacemaker in situ   . Abdominal aneurysm without mention of rupture   . Hernia   . AAA (abdominal aortic aneurysm) ([place])   . Hypertension   . Hyperlipidemia     Social History  Substance Use Topics  . Smoking status: Former Smoker -- 1.50 packs/day for 50 years    Types: Cigarettes    Quit date: 10/03/1995  . Smokeless tobacco: Never Used  . Alcohol Use: 0.0 oz/week    0 Standard drinks or equivalent per week     Comment: occasionally    Family History  Problem Relation Age of Onset  . Heart disease Father   . Alcohol abuse Father   . Cancer Brother     Allergies  Allergen Reactions  . Codeine     Made her feel crazy  . Morphine     Made her feel crazy  . Zyrtec [Cetirizine]      Causes facial numbness     Current outpatient prescriptions:  .  albuterol (PROAIR HFA) 108 (90 BASE) MCG/ACT inhaler, Inhale 2 puffs into the lungs every 6 (six) hours as needed for wheezing or shortness of breath., Disp: 1 Inhaler, Rfl: 5 .  atenolol (TENORMIN) 25 MG tablet, Take 1 tablet (25 mg total) by mouth daily., Disp: 180 tablet, Rfl: 3 .  Fluticasone-Salmeterol (ADVAIR DISKUS) 100-50 MCG/DOSE AEPB, Inhale 1 puff into the lungs 2 (two) times daily., Disp: 60 each, Rfl: 5 .  hydrochlorothiazide (HYDRODIURIL) 25 MG tablet, Take one tablet by mouth daily as needed for swelling, Disp: 30 tablet, Rfl: 3 .  warfarin (COUMADIN) 5 MG tablet, Take as directed by anticoagulation clinic, Disp: 100 tablet, Rfl: 1  Filed Vitals:   10/26/14 1406 10/26/14 1408  BP: 142/81 146/82  Pulse: 70 70  Temp: 98.5 F (36.9 C)   Resp: 14   Height: 5\' 7"  (1.702 m)   Weight: 146 lb (66.225 kg)   SpO2: 96%     Body mass index is 22.86 kg/(m^2).           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 All other systems negative and complete review of systems    Objective:   Physical Exam BP 146/82 mmHg  Pulse 70  Temp(Src) 98.5 F (36.9 C)  Resp 14  Ht 5\' 7"  (1.702 m)  Wt 146 lb (66.225 kg)  BMI 22.86 kg/m2  SpO2 96%  Gen.-alert and oriented x3 in no apparent distress HEENT normal for age Lungs no rhonchi or wheezing Cardiovascular regular rhythm no murmurs carotid pulses 3+ palpable no bruits audible Abdomen soft nontender no palpable masses Musculoskeletal free of  major deformities Skin clear -no rashes Neurologic normal Lower extremities 3+ femoral and dorsalis pedis pulses palpable bilaterally with no edema    today I reviewed the CT scan which was performed on September 09 2014 also compared it to a CT scan performed in 2010.  the stenosis at the origin of the right renal artery appears unchanged from 2010. The aneurysm in the left renal artery was not visible at that time.    The current study shows widely patent flow through the aneurysm sac with some possible narrowing at the distal end of the aneurysm.       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

## 2014-10-26 NOTE — Progress Notes (Signed)
Filed Vitals:   10/26/14 1406 10/26/14 1408  BP: 142/81 146/82  Pulse: 70 70  Temp: 98.5 F (36.9 C)   Resp: 14   Height: 5\' 7"  (1.702 m)   Weight: 146 lb (66.225 kg)   SpO2: 96%

## 2014-11-08 ENCOUNTER — Ambulatory Visit: Payer: PPO | Admitting: Gastroenterology

## 2014-11-24 ENCOUNTER — Ambulatory Visit (INDEPENDENT_AMBULATORY_CARE_PROVIDER_SITE_OTHER): Payer: PPO

## 2014-11-24 ENCOUNTER — Telehealth: Payer: Self-pay | Admitting: *Deleted

## 2014-11-24 DIAGNOSIS — Z7901 Long term (current) use of anticoagulants: Secondary | ICD-10-CM

## 2014-11-24 DIAGNOSIS — I4891 Unspecified atrial fibrillation: Secondary | ICD-10-CM | POA: Diagnosis not present

## 2014-11-24 DIAGNOSIS — Z5181 Encounter for therapeutic drug level monitoring: Secondary | ICD-10-CM

## 2014-11-24 LAB — POCT INR: INR: 4.4

## 2014-11-24 MED ORDER — TIOTROPIUM BROMIDE MONOHYDRATE 18 MCG IN CAPS: 18.0000 ug | ORAL_CAPSULE | Freq: Every day | RESPIRATORY_TRACT | Status: DC

## 2014-11-24 NOTE — Telephone Encounter (Signed)
*  STAT* If patient is at the pharmacy, call can be transferred to refill team.   1. Which medications need to be refilled? Spirivia Inhaler  2. Which pharmacy/location (including street and city if local pharmacy) is medication to be sent to? [facility] [address] [place]  3. Do they need a 30 day or 90 day supply? 90 day

## 2014-11-24 NOTE — Telephone Encounter (Signed)
Informed patient I was sending 2 months of Spiriva HH to pharmacy until her f/u in December. Pt agreed. Nothing further needed.

## 2014-11-29 ENCOUNTER — Ambulatory Visit (INDEPENDENT_AMBULATORY_CARE_PROVIDER_SITE_OTHER)
Admission: RE | Admit: 2014-11-29 | Discharge: 2014-11-29 | Disposition: A | Discharge status: Home / Self Care | Payer: PPO | Source: Ambulatory Visit | Attending: Family Medicine | Admitting: Family Medicine

## 2014-11-29 ENCOUNTER — Encounter: Payer: Self-pay | Admitting: Family Medicine

## 2014-11-29 ENCOUNTER — Ambulatory Visit (INDEPENDENT_AMBULATORY_CARE_PROVIDER_SITE_OTHER): Payer: PPO | Admitting: Family Medicine

## 2014-11-29 VITALS — BP 134/78 | HR 69 | Temp 97.7°F | Wt 149.2 lb

## 2014-11-29 DIAGNOSIS — S8991XA Unspecified injury of right lower leg, initial encounter: Secondary | ICD-10-CM | POA: Diagnosis not present

## 2014-11-29 MED ORDER — CEPHALEXIN 500 MG PO CAPS: 500.0000 mg | ORAL_CAPSULE | Freq: Two times a day (BID) | ORAL | Status: DC

## 2014-11-29 NOTE — Progress Notes (Signed)
Patient ID: Jessica [name], female   DOB: 05-23-1929, 81 y.o.   MRN: [id number]  [name], MD Phone: [phone]  Jessica [name] is a 81 y.o. female who presents today for same-day visit.  Right lower extremity injury: Patient notes she was carrying her dog on Friday when she bumped into a laundry basket. This basket was plastic. She did not fall with this. She noted a laceration on her right lower extremity anterior shin with this. She noted it bled on Friday and that she was able to get this to stop with ice and pressure. She notes that initially it was just bruising and a laceration. She has since developed erythema around the cut that has spread down towards her ankle. She notes she went to the pharmacist and they told her to get seen by a physician as it looked infected. She's been using soap and water and antibiotic ointment to cleanse the area. She's not had any fevers. No nausea or vomiting. She feels at her baseline. She notes she is on warfarin.  PMH: Former smoker.   ROSsee history of present illness  Objective  Physical Exam Filed Vitals:   11/29/14 1258  BP: 134/78  Pulse: 69  Temp: 97.7 F (36.5 C)    Physical Exam  Constitutional: She is well-developed, well-nourished, and in no distress.  HENT:  Head: Normocephalic and atraumatic.  Cardiovascular: Normal rate, regular rhythm and normal heart sounds.   2+ DP pulses  Pulmonary/Chest: Effort normal and breath sounds normal. No respiratory distress. She has no wheezes. She has no rales.  Musculoskeletal:  Full ankle range of motion bilaterally, mild edema in the area of erythema and proximal ankle and dorsal foot, there is mild tenderness of the lower portion of the tibia where there is erythema, no medial or lateral malleoli tenderness, no navicular tenderness, no fifth metatarsal tenderness, no tenderness of her foot  Neurological:  5 out of 5 strength in bilateral plantar flexion and dorsiflexion, sensation to  light touch intact in bilateral feet  Skin: She is not diaphoretic.     There is no fluctuance of the area of erythema, or induration of this area   the area of the cut with overlying scab appears to be well healing   Assessment/Plan: Please see individual problem list.  Injury of right lower extremity and cellulitis Patient with injury to right lower extremity with laceration on Friday. Area of laceration is scabbed and appears to be well-healing, though there does appear to be erythema surrounding the medial portion spreading to the inferior portion of this. This area of erythema is concerning for cellulitis. Patient is well-appearing and she is at her baseline. She is afebrile. Her vitals are stable. She is neurovascularly intact in bilateral lower extremities. We will treat this likely area of cellulitis with Keflex. Patient's creatinine clearance is 36 and places her in the range of Keflex 500 mg twice daily to treat soft tissue infection. There is no sign of purulent infection to indicate need for coverage of MRSA at this time.. Given the bony tenderness of the tibia we will additionally obtain plain films of her right tibia and fibula as well as right ankle. Follow-up in 2 days for recheck. She is given return precautions.    Orders Placed This Encounter  Procedures  . DG Tibia/Fibula Right    Standing Status: Future     Number of Occurrences: 1     Standing Expiration Date: 01/29/2016    Order Specific  Question:  Reason for Exam (SYMPTOM  OR DIAGNOSIS REQUIRED)    Answer:  injury to right lower leg    Order Specific Question:  Preferred imaging location?    Answer:  [facility]  . DG Ankle Complete Right    Standing Status: Future     Number of Occurrences: 1     Standing Expiration Date: 01/29/2016    Order Specific Question:  Reason for Exam (SYMPTOM  OR DIAGNOSIS REQUIRED)    Answer:  injury to right lower leg    Order Specific Question:  Preferred imaging location?     Answer:  [facility] ordered this encounter  Medications  . cephALEXin (KEFLEX) 500 MG capsule    Sig: Take 1 capsule (500 mg total) by mouth 2 (two) times daily.    Dispense:  14 capsule    Refill:  0    [name]

## 2014-11-29 NOTE — Patient Instructions (Signed)
Nice to meet you. We will start you on keflex for likely cellulitis. Please take this twice a day.  Please use soap and water on the area of your scab.  You can take tylenol for pain.  If you have spreading redness, increased pain, develop fever, nausea, vomiting, numbness, weakness, or any new or change in symptoms please seek medical attention.

## 2014-11-29 NOTE — Progress Notes (Signed)
Pre visit review using our clinic review tool, if applicable. No additional management support is needed unless otherwise documented below in the visit note. 

## 2014-11-29 NOTE — Assessment & Plan Note (Signed)
Patient with injury to right lower extremity with laceration on Friday. Area of laceration is scabbed and appears to be well-healing, though there does appear to be erythema surrounding the medial portion spreading to the inferior portion of this. This area of erythema is concerning for cellulitis. Patient is well-appearing and she is at her baseline. She is afebrile. Her vitals are stable. She is neurovascularly intact in bilateral lower extremities. We will treat this likely area of cellulitis with Keflex. Patient's creatinine clearance is 36 and places her in the range of Keflex 500 mg twice daily to treat soft tissue infection. There is no sign of purulent infection to indicate need for coverage of MRSA at this time.. Given the bony tenderness of the tibia we will additionally obtain plain films of her right tibia and fibula as well as right ankle. Follow-up in 2 days for recheck. She is given return precautions.

## 2014-12-01 ENCOUNTER — Ambulatory Visit (INDEPENDENT_AMBULATORY_CARE_PROVIDER_SITE_OTHER): Payer: PPO | Admitting: Family Medicine

## 2014-12-01 ENCOUNTER — Encounter: Payer: Self-pay | Admitting: Family Medicine

## 2014-12-01 VITALS — BP 126/84 | HR 72 | Temp 97.9°F | Wt 148.4 lb

## 2014-12-01 DIAGNOSIS — S8991XD Unspecified injury of right lower leg, subsequent encounter: Secondary | ICD-10-CM

## 2014-12-01 NOTE — Patient Instructions (Signed)
Nice to see you. I am glad you are doing better. Please finish the antibiotics.  If he develops spreading redness, pain, numbness, tingling, weakness, fever, drainage, bleeding, chills, feel poorly, or any new or changing symptoms please seek medical attention immediately.

## 2014-12-01 NOTE — Progress Notes (Signed)
Patient ID: [name], female   DOB: 1929-03-03, 81 y.o.   MRN: [id number]  [name], MD Phone: [phone]  Jessica [name] is a 81 y.o. female who presents today for follow-up.  Patient presents for follow-up of right lower extremity injury and cellulitis. She now she feels well. She's not had any fever. She notes the redness is improved. She's not had any bleeding from the site. The swelling in her legs got better as well. She had x-rays that were negative for fracture. She does note her bunion on her right foot has been mildly bothersome since she had this injury. There is no warmth or tenderness over the bunion. There is been no erythema.  PMH: Former smoker.   ROS see history of present illness  Objective  Physical Exam Filed Vitals:   12/01/14 1325  BP: 126/84  Pulse: 72  Temp: 97.9 F (36.6 C)    Physical Exam  Constitutional: She is well-developed, well-nourished, and in no distress.  Cardiovascular:  2+ DP pulses in right foot  Musculoskeletal:  Full ankle range of motion, full right first MTP joint range of motion, no tenderness of the ankle or right first MTP, no warmth of ankle or lower leg or foot, sensation to light touch intact in right lower extremity  Neurological: She is alert. Gait normal.  Skin: She is not diaphoretic.  Similar J-shaped laceration the last visit, this is well healing and scabbed over, there is no bleeding or drainage from the laceration, the area of erythema is much improved and now is just to the medial aspect of the laceration, there is no erythema down to the ankle or extending proximally, there is no induration or fluctuance of this area, there is bruising on the lateral aspect of the laceration, there is a blister with apparent clear fluid noted over the bruising that is about half a centimeter in diameter     Assessment/Plan: Please see individual problem list.  Injury of right lower extremity and cellulitis Much improved  since last visit. Erythema has improved significantly. Swelling is improved significantly as well. She is neurovascularly intact. Vital signs stable. She will continue Keflex. Discomfort in the bunion likely related to slightly altered gait from injury to leg. Does not appear infected. Advised on bunion pad. Offered referral to podiatry, though she declined this. Blister noted over area of bruising, discussed this did not look infected. Does not appear to be an abscess or pustule. Did discuss deroofing it, though patient declined at this time. Given return precautions.    [name]

## 2014-12-01 NOTE — Assessment & Plan Note (Addendum)
Much improved since last visit. Erythema has improved significantly. Swelling is improved significantly as well. She is neurovascularly intact. Vital signs stable. She will continue Keflex. Discomfort in the bunion likely related to slightly altered gait from injury to leg. Does not appear infected. Advised on bunion pad. Offered referral to podiatry, though she declined this. Blister noted over area of bruising, discussed this did not look infected. Does not appear to be an abscess or pustule. Did discuss deroofing it, though patient declined at this time. Given return precautions.

## 2014-12-01 NOTE — Progress Notes (Signed)
Pre visit review using our clinic review tool, if applicable. No additional management support is needed unless otherwise documented below in the visit note. 

## 2014-12-08 ENCOUNTER — Ambulatory Visit (INDEPENDENT_AMBULATORY_CARE_PROVIDER_SITE_OTHER): Payer: PPO

## 2014-12-08 DIAGNOSIS — Z7901 Long term (current) use of anticoagulants: Secondary | ICD-10-CM

## 2014-12-08 DIAGNOSIS — I4891 Unspecified atrial fibrillation: Secondary | ICD-10-CM

## 2014-12-08 DIAGNOSIS — Z5181 Encounter for therapeutic drug level monitoring: Secondary | ICD-10-CM | POA: Diagnosis not present

## 2014-12-08 LAB — POCT INR: INR: 2.5

## 2014-12-29 ENCOUNTER — Ambulatory Visit (INDEPENDENT_AMBULATORY_CARE_PROVIDER_SITE_OTHER): Payer: PPO

## 2014-12-29 DIAGNOSIS — Z7901 Long term (current) use of anticoagulants: Secondary | ICD-10-CM | POA: Diagnosis not present

## 2014-12-29 DIAGNOSIS — I4891 Unspecified atrial fibrillation: Secondary | ICD-10-CM

## 2014-12-29 DIAGNOSIS — Z5181 Encounter for therapeutic drug level monitoring: Secondary | ICD-10-CM

## 2014-12-29 LAB — POCT INR: INR: 2.5

## 2014-12-29 MED ORDER — WARFARIN SODIUM 5 MG PO TABS: ORAL_TABLET | ORAL | Status: DC

## 2015-01-11 ENCOUNTER — Encounter: Payer: Self-pay | Admitting: Internal Medicine

## 2015-01-11 ENCOUNTER — Ambulatory Visit (INDEPENDENT_AMBULATORY_CARE_PROVIDER_SITE_OTHER): Payer: PPO | Admitting: Internal Medicine

## 2015-01-11 ENCOUNTER — Ambulatory Visit: Payer: PPO | Admitting: Internal Medicine

## 2015-01-11 ENCOUNTER — Ambulatory Visit (INDEPENDENT_AMBULATORY_CARE_PROVIDER_SITE_OTHER): Payer: PPO | Admitting: *Deleted

## 2015-01-11 VITALS — BP 126/72 | HR 69 | Ht 67.0 in | Wt 147.0 lb

## 2015-01-11 DIAGNOSIS — Z950 Presence of cardiac pacemaker: Secondary | ICD-10-CM | POA: Diagnosis not present

## 2015-01-11 DIAGNOSIS — I442 Atrioventricular block, complete: Secondary | ICD-10-CM

## 2015-01-11 DIAGNOSIS — J438 Other emphysema: Secondary | ICD-10-CM | POA: Diagnosis not present

## 2015-01-11 DIAGNOSIS — I4819 Other persistent atrial fibrillation: Secondary | ICD-10-CM

## 2015-01-11 DIAGNOSIS — I481 Persistent atrial fibrillation: Secondary | ICD-10-CM

## 2015-01-11 LAB — CUP PACEART INCLINIC DEVICE CHECK
Battery Impedance: 3200 Ohm
Battery Voltage: 2.75 V
Date Time Interrogation Session: 20161220092327
Implantable Lead Implant Date: 20090610
Implantable Lead Implant Date: 20090610
Implantable Lead Location: 753859
Implantable Lead Location: 753860
Implantable Lead Model: 5076
Lead Channel Impedance Value: 380 Ohm
Lead Channel Impedance Value: 505 Ohm
Lead Channel Setting Pacing Amplitude: 2 V
Lead Channel Setting Pacing Pulse Width: 0.5 ms
Lead Channel Setting Sensing Sensitivity: 2 mV
Pulse Gen Model: 5826
Pulse Gen Serial Number: 1236137

## 2015-01-11 MED ORDER — FLUTICASONE-SALMETEROL 250-50 MCG/DOSE IN AEPB: 1.0000 | INHALATION_SPRAY | Freq: Two times a day (BID) | RESPIRATORY_TRACT | Status: DC

## 2015-01-11 NOTE — Progress Notes (Signed)
Review of Systems  Constitutional: Negative for fever, chills and weight loss.  HENT: Negative for ear discharge and ear pain.   Eyes: Negative for blurred vision.  Respiratory: Positive for shortness of breath. Negative for cough, hemoptysis, sputum production and wheezing.   Gastrointestinal: Negative for heartburn, nausea and vomiting.  Genitourinary: Negative for dysuria.  Skin: Negative for rash.  Neurological: Negative for headaches.     Subjective:    Patient ID: Jessica [name], female    DOB: 21-May-1929, 81 y.o.   MRN: [id number]  Synopsis: Jessica [name] first saw the [facility] clinic in 09/2011 for GOLD grade C COPD.  She smoked until 2001.  In 2013 she had spirometry which showed ratio 53%, FEV1 0.97 L (44% predicted) and an MMRC score of 1.  HPI  Chief Complaint  Patient presents with  . PULMONARY CONSULT    former BQ pt. seen for COPD. pt. states she feels breathing has worsen due to the weather change. occ. prod. cough clear in color. denies wheezing or chest pain/tightness.   Today's Clinic Visit: Patient presents today for a follow-up visit and acute care visit. She states that since the weather changes she's had worsening of her COPD symptoms, which she describes as increased shortness of breath with exertion. She denies any recent sick contacts, fever, chills, sputum production, worsening cough. She has COPD stage C currently on Advair and Spiriva. Her Advair dose is 100/50, she's had to use albuterol may be 2-3 times in the last 2-3 weeks. Overall no significant symptoms except dyspnea on exertion.  Past Medical History  Diagnosis Date  . Atrioventricular block, complete ([place])   . Cardiac pacemaker in situ   . Abdominal aneurysm without mention of rupture   . Hernia   . AAA (abdominal aortic aneurysm) ([place])   . Hypertension   . Hyperlipidemia           Objective:   Physical Exam Filed Vitals:   01/11/15 0947  BP: 126/72  Pulse: 69  Height:  5\' 7"  (1.702 m)  Weight: 147 lb (66.679 kg)  SpO2: 97%   RA  Gen: well appearing HENT: OP clear, TM's clear, neck supple PULM: Mild wheezing, normal respiratory effort, normal percussion CV: RRR, slight systolic murmur, trace edema GI: BS+, soft, nontender Derm: no cyanosis or rash Psyche: normal mood and affect   The following images were reviewed by Dr. Stevenson Clinch 01/11/15 CT A/P 11/2014  FINDINGS: Lower chest: Lungs are clear. Heart size is normal. Pacemaker in place. Prominent left posterior diaphragmatic hernia containing only fat with only a 6 mm defect in the diaphragm.  Hepatobiliary: The liver is normal. Single tiny stone in the gallbladder. Biliary tree is otherwise normal.  Pancreas: Normal.  Spleen: Normal.  Adrenals/Urinary Tract: Adrenal glands are normal. Benign cysts in both kidneys, 20 mm in the left upper pole and 24 mm in the left lower pole with 13 and 7 mm cysts in the right mid kidney. Small stones in the lower pole collecting system of the right kidney. Ureters and bladder are normal.  Stomach/Bowel: Small hiatal hernia. Extensive diverticulosis of the sigmoid colon without diverticulitis.  Vascular/Lymphatic: Extensive aortic atherosclerosis. 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 The aneurysm measures 19 x 18 mm. There appears to be a fairly severe stenosis at the distal aspect of the aneurysm.  There appears to be a severe stenosis of the origin of the right renal artery.  Aorto bi-iliac graft in place.  Reproductive: Normal.  Other: No free air or free fluid. In there is a pseudo periumbilical hernia in the midline. The fascia is quite laxity in the midline with a portion of the transverse colon protruding along with a bulging and stretched linea alba.  Musculoskeletal: No acute abnormalities.  IMPRESSION: 1. Extensive diverticulosis of the sigmoid portion of the colon. 2. 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 3. Possible severe proximal right renal artery stenosis.  November 2014 chest x-ray reviewed today clinic> emphysema bilaterally, mild mucus plugging versus slight atelectasis in the lingula, pacemaker placed The most recent office notes from her cardiologist were reviewed in clinic and it looks that her A. fib has been stable with no changes needed as of November 2015    Assessment & Plan:   COPD (chronic obstructive pulmonary disease) This has been worsening recently and she has more wheezing on exam today. There is nothing in her history since suggested new environmental exposure. My presumption is that this is the natural history of COPD worsening along with rapid weather changes.  Plan:  - Increase Advair to 200/50, 1 puff twice a day, gargle and rinse after each use -Continue Spiriva -albuterol, advised to use prior to exercise -If dyspnea on exertion does not improve with increase Advair, will place her on a short course of prednisone (20 mg, 1 tab daily 5 days).    Updated Medication List Outpatient Encounter Prescriptions as of 01/11/2015  Medication Sig  . albuterol (PROAIR HFA) 108 (90 BASE) MCG/ACT inhaler Inhale 2 puffs into the lungs every 6 (six) hours as needed for wheezing or shortness of breath.  Marland Kitchen atenolol (TENORMIN) 25 MG tablet Take 1 tablet (25 mg total) by mouth daily.  Marland Kitchen atorvastatin (LIPITOR) 20 MG tablet Take 20 mg by mouth daily.  . Fluticasone-Salmeterol (ADVAIR DISKUS) 100-50 MCG/DOSE AEPB Inhale 1 puff into the lungs 2 (two) times daily.  . hydrochlorothiazide (HYDRODIURIL) 25 MG tablet Take one tablet by mouth daily as needed for swelling  . tiotropium (SPIRIVA) 18 MCG inhalation capsule Place 1 capsule (18 mcg total) into inhaler and inhale daily.  Marland Kitchen warfarin (COUMADIN) 5 MG tablet Take as directed by anticoagulation clinic  .  Fluticasone-Salmeterol (ADVAIR DISKUS) 250-50 MCG/DOSE AEPB Inhale 1 puff into the lungs 2 (two) times daily.  . [DISCONTINUED] cephALEXin (KEFLEX) 500 MG capsule Take 1 capsule (500 mg total) by mouth 2 (two) times daily. (Patient not taking: Reported on 01/11/2015)   No facility-administered encounter medications on file as of 01/11/2015.

## 2015-01-11 NOTE — Progress Notes (Signed)
Pacemaker check in clinic. Normal device function. Threshold, sensing, impedances consistent with previous measurements. Device programmed to maximize longevity. 82% mode switch + eliquis. Device programmed at appropriate safety margins. Histogram distribution appropriate for patient activity level. Estimated longevity 1-1.78yrs. ROV w/ SK/B in 66mo.

## 2015-01-11 NOTE — Assessment & Plan Note (Signed)
This has been worsening recently and she has more wheezing on exam today. There is nothing in her history since suggested new environmental exposure. My presumption is that this is the natural history of COPD worsening along with rapid weather changes.  Plan:  - Increase Advair to 200/50, 1 puff twice a day, gargle and rinse after each use -Continue Spiriva -albuterol, advised to use prior to exercise -If dyspnea on exertion does not improve with increase Advair, will place her on a short course of prednisone (20 mg, 1 tab daily 5 days).

## 2015-01-11 NOTE — Patient Instructions (Signed)
Follow up with Dr. Stevenson Clinch in: 3 months - cont with spiriva - cont with albuterol as needed - cont with exercise as tolerated - we will change your advair dose for the winter months to : Advair 250/50 - 1 puff BID. -gargle and rinse after each use.  - if no improvement after advair increase, then call us back, you might require a short course of tapering steroids.

## 2015-01-13 ENCOUNTER — Ambulatory Visit: Payer: PPO | Admitting: Gastroenterology

## 2015-01-26 ENCOUNTER — Ambulatory Visit (INDEPENDENT_AMBULATORY_CARE_PROVIDER_SITE_OTHER): Payer: PPO

## 2015-01-26 DIAGNOSIS — I4891 Unspecified atrial fibrillation: Secondary | ICD-10-CM

## 2015-01-26 DIAGNOSIS — Z5181 Encounter for therapeutic drug level monitoring: Secondary | ICD-10-CM | POA: Diagnosis not present

## 2015-01-26 DIAGNOSIS — I481 Persistent atrial fibrillation: Secondary | ICD-10-CM

## 2015-01-26 DIAGNOSIS — I4819 Other persistent atrial fibrillation: Secondary | ICD-10-CM

## 2015-01-26 DIAGNOSIS — Z7901 Long term (current) use of anticoagulants: Secondary | ICD-10-CM

## 2015-01-26 LAB — POCT INR: INR: 2.8

## 2015-02-07 ENCOUNTER — Encounter: Payer: Self-pay | Admitting: Internal Medicine

## 2015-03-04 ENCOUNTER — Ambulatory Visit (INDEPENDENT_AMBULATORY_CARE_PROVIDER_SITE_OTHER): Payer: PPO

## 2015-03-04 VITALS — BP 136/74 | HR 90 | Temp 98.2°F | Resp 14 | Ht 65.0 in | Wt 147.0 lb

## 2015-03-04 DIAGNOSIS — Z0000 Encounter for general adult medical examination without abnormal findings: Secondary | ICD-10-CM | POA: Diagnosis not present

## 2015-03-04 NOTE — Patient Instructions (Addendum)
  Jessica [name],  Thank you for taking time to come for your Medicare Wellness Visit. I appreciate your ongoing commitment to your health goals. Please review the following plan we discussed and let me know if I can assist you in the future.   Follow up with Dr. Gilford Rile for knee pain if condition worsens.  Uses walker to ambulate for unsteady gait.   This is a list of the screening recommended for you and due dates:  Health Maintenance  Topic Date Due  . Tetanus Vaccine  05/05/1950  . Shingles Vaccine  05/05/1991  . DEXA scan (bone density measurement)  05/04/1996  . Flu Shot  08/23/2015  . Pneumonia vaccines  Completed    Bone Densitometry Bone densitometry is an imaging test that uses a special X-ray to measure the amount of calcium and other minerals in your bones (bone density). This test is also known as a bone mineral density test or dual-energy X-ray absorptiometry (DXA). The test can measure bone density at your hip and your spine. It is similar to having a regular X-ray. You may have this test to:  Diagnose a condition that causes weak or thin bones (osteoporosis).  Predict your risk of a broken bone (fracture).  Determine how well osteoporosis treatment is working. LET [facility] CARE PROVIDER KNOW ABOUT:  Any allergies you have.  All medicines you are taking, including vitamins, herbs, eye drops, creams, and over-the-counter medicines.  Previous problems you or members of your family have had with the use of anesthetics.  Any blood disorders you have.  Previous surgeries you have had.  Medical conditions you have.  Possibility of pregnancy.  Any other medical test you had within the previous 14 days that used contrast material. RISKS AND COMPLICATIONS Generally, this is a safe procedure. However, problems can occur and may include the following:  This test exposes you to a very small amount of radiation.  The risks of radiation exposure may be greater to unborn  children. BEFORE THE PROCEDURE  Do not take any calcium supplements for 24 hours before having the test. You can otherwise eat and drink what you usually do.  Take off all metal jewelry, eyeglasses, dental appliances, and any other metal objects. PROCEDURE  You may lie on an exam table. There will be an X-ray generator below you and an imaging device above you.  Other devices, such as boxes or braces, may be used to position your body properly for the scan.  You will need to lie still while the machine slowly scans your body.  The images will show up on a computer monitor. AFTER THE PROCEDURE You may need more testing at a later time.   This information is not intended to replace advice given to you by your health care provider. Make sure you discuss any questions you have with your health care provider.   Document Released: 01/31/2004 Document Revised: 01/29/2014 Document Reviewed: 06/18/2013 Elsevier Interactive Patient Education [employer].

## 2015-03-04 NOTE — Progress Notes (Signed)
Subjective:   Jessica [name] is a 82 y.o. female who presents for Medicare Annual (Subsequent) preventive examination.  Review of Systems:  No ROS.  Medicare Wellness Visit.  Cardiac Risk Factors include: hypertension;advanced age (>83men, >80 women)     Objective:     Vitals: BP 136/74 mmHg  Pulse 90  Temp(Src) 98.2 F (36.8 C) (Oral)  Resp 14  Ht 5\' 5"  (1.651 m)  Wt 147 lb (66.679 kg)  BMI 24.46 kg/m2  SpO2 97%  Tobacco History  Smoking status  . Former Smoker -- 1.50 packs/day for 50 years  . Types: Cigarettes  . Quit date: 10/03/1995  Smokeless tobacco  . Never Used     Counseling given: Not Answered   Past Medical History  Diagnosis Date  . Atrioventricular block, complete ([place])   . Cardiac pacemaker in situ   . Abdominal aneurysm without mention of rupture   . Hernia   . AAA (abdominal aortic aneurysm) ([place])   . Hypertension   . Hyperlipidemia    Past Surgical History  Procedure Laterality Date  . [place]      growth on colon surgery  . Tonsillectomy    . Abdominal aortic aneurysm repair    . Hernia repair     Family History  Problem Relation Age of Onset  . Heart disease Father   . Alcohol abuse Father   . Cancer Brother    History  Sexual Activity  . Sexual Activity: No    Outpatient Encounter Prescriptions as of 03/04/2015  Medication Sig  . albuterol (PROAIR HFA) 108 (90 BASE) MCG/ACT inhaler Inhale 2 puffs into the lungs every 6 (six) hours as needed for wheezing or shortness of breath.  Marland Kitchen atenolol (TENORMIN) 25 MG tablet Take 1 tablet (25 mg total) by mouth daily.  . Fluticasone-Salmeterol (ADVAIR DISKUS) 100-50 MCG/DOSE AEPB Inhale 1 puff into the lungs 2 (two) times daily.  . Fluticasone-Salmeterol (ADVAIR DISKUS) 250-50 MCG/DOSE AEPB Inhale 1 puff into the lungs 2 (two) times daily.  . hydrochlorothiazide (HYDRODIURIL) 25 MG tablet Take one tablet by mouth daily as needed for swelling  . tiotropium (SPIRIVA) 18 MCG inhalation  capsule Place 1 capsule (18 mcg total) into inhaler and inhale daily.  Marland Kitchen warfarin (COUMADIN) 5 MG tablet Take as directed by anticoagulation clinic  . [DISCONTINUED] atorvastatin (LIPITOR) 20 MG tablet Take 20 mg by mouth daily.   No facility-administered encounter medications on file as of 03/04/2015.    Activities of Daily Living In your present state of health, do you have any difficulty performing the following activities: 03/04/2015  Hearing? N  Vision? N  Difficulty concentrating or making decisions? N  Walking or climbing stairs? Y  Dressing or bathing? N  Doing errands, shopping? N  Preparing Food and eating ? N  Using the Toilet? N  In the past six months, have you accidently leaked urine? N  Do you have problems with loss of bowel control? N  Managing your Medications? N  Managing your Finances? N  Housekeeping or managing your Housekeeping? N    Patient Care Team: Jackolyn Confer, MD as PCP - General (Internal Medicine) Deboraha Sprang, MD (Cardiology) Minna Merritts, MD (Cardiology)    Assessment:    This is a routine wellness examination for Jessica [name]. The goal of the wellness visit is to assist the patient how to close the gaps in care and create a preventative care plan for the patient.   Osteoporosis risk reviewed.  Bone Density postponed, per patient request.  Educational material provided.  Medications reviewed; taking without issues or barriers.  Safety issues reviewed; smoke detectors in the home. No firearms in the home. Wears seatbelts when driving or riding with others. No violence in the home.  No identified risk were noted; The patient was oriented x 3; appropriate in dress and manner and no objective failures at ADL's or IADL's.    Chr airway obstruct-stable and followed by Dr. Stevenson Clinch Chronic obstructive-stable and followed by Dr. Stevenson Clinch Renal artery aneurys-stable and followed by Dr. Kellie Simmering Aneurysm of renal a-stable and followed by Dr.  Kellie Simmering Renal artery atheros-stable and followed by Dr. Kellie Simmering Atrial fibrillation-stable and followed by Dr. Buckner [place] athscl unsp type bypass of the extrm, unsp ex-stable and followed by Dr. Caryl Comes Atriovent block complete-stable and followed by Dr. Caryl Comes Abdominal aortic aneurysm, without rupture-stable and followed by PCP  TDAP and Zostavax vaccine postponed per patient request to follow up with insurance.  Educational material provided.  Patient Concerns: Intermittent  L knee pain; onset 1 week. Declined follow up appointment. She states she will try to start wearing a brace on the knee and using her walker to ambulate more often.  Encouraged to follow up with PCP as needed.    Exercise Activities and Dietary recommendations Current Exercise Habits:: The patient does not participate in regular exercise at present  Goals    . Increase physical activity     Start using the exercise bike at [facility] for 2 days a week for 30 minute sessions.      Fall Risk Fall Risk  03/04/2015 04/22/2013  Falls in the past year? Yes Yes  Number falls in past yr: 1 1  Injury with Fall? Yes No  Follow up Education provided;Falls prevention discussed -   Depression Screen PHQ 2/9 Scores 03/04/2015 04/22/2013 01/30/2013  PHQ - 2 Score 0 0 3  PHQ- 9 Score - - 6     Cognitive Testing MMSE - Mini Mental State Exam 03/04/2015  Orientation to time 5  Orientation to Place 5  Registration 3  Attention/ Calculation 5  Recall 3  Language- name 2 objects 2  Language- repeat 1  Language- follow 3 step command 3  Language- read & follow direction 1  Write a sentence 1  Copy design 1  Total score 30    Immunization History  Administered Date(s) Administered  . Influenza Split 12/13/2010, 09/27/2011, 12/13/2013  . Influenza Whole 11/28/2009  . Influenza, High Dose Seasonal PF 11/26/2012  . Influenza, Seasonal, Injecte, Preservative Fre 11/23/2014  . Pneumococcal Conjugate-13 04/22/2013   . Pneumococcal Polysaccharide-23 08/27/2006   Screening Tests Health Maintenance  Topic Date Due  . TETANUS/TDAP  05/05/1950  . ZOSTAVAX  05/05/1991  . DEXA SCAN  05/04/1996  . INFLUENZA VACCINE  08/23/2015  . PNA vac Low Risk Adult  Completed      Plan:   End of life planning; Advance aging; Advanced directives discussed. Reports Living Will on file with no changes at this time.   During the course of the visit the patient was educated and counseled about the following appropriate screening and preventive services:   Vaccines to include Pneumoccal, Influenza, Hepatitis B, Td, Zostavax, HCV  Electrocardiogram  Cardiovascular Disease  Colorectal cancer screening  Bone density screening  Diabetes screening  Glaucoma screening  Mammography/PAP  Nutrition counseling   Patient Instructions (the written plan) was given to the patient.   OBrien-Blaney, Denisa L, LPN  03/04/2015      

## 2015-03-04 NOTE — Progress Notes (Signed)
Annual Wellness Visit as completed by Health Coach was reviewed in full.  

## 2015-03-09 ENCOUNTER — Ambulatory Visit (INDEPENDENT_AMBULATORY_CARE_PROVIDER_SITE_OTHER): Payer: PPO

## 2015-03-09 DIAGNOSIS — Z7901 Long term (current) use of anticoagulants: Secondary | ICD-10-CM

## 2015-03-09 DIAGNOSIS — Z5181 Encounter for therapeutic drug level monitoring: Secondary | ICD-10-CM | POA: Diagnosis not present

## 2015-03-09 DIAGNOSIS — I4891 Unspecified atrial fibrillation: Secondary | ICD-10-CM

## 2015-03-09 DIAGNOSIS — I481 Persistent atrial fibrillation: Secondary | ICD-10-CM | POA: Diagnosis not present

## 2015-03-09 DIAGNOSIS — I4819 Other persistent atrial fibrillation: Secondary | ICD-10-CM

## 2015-03-09 LAB — POCT INR: INR: 2.7

## 2015-03-22 ENCOUNTER — Other Ambulatory Visit: Payer: Self-pay | Admitting: Internal Medicine

## 2015-03-24 ENCOUNTER — Telehealth: Payer: Self-pay | Admitting: Internal Medicine

## 2015-03-24 MED ORDER — TIOTROPIUM BROMIDE MONOHYDRATE 18 MCG IN CAPS: 18.0000 ug | ORAL_CAPSULE | Freq: Every day | RESPIRATORY_TRACT | Status: DC

## 2015-03-24 NOTE — Telephone Encounter (Signed)
°*  STAT* If patient is at the pharmacy, call can be transferred to refill team.   1. Which medications need to be refilled? (please list name of each medication and dose if known) Spiriva   2. Which pharmacy/location (including street and city if local pharmacy) is medication to be sent to?  [facility]   3. Do they need a 30 day or 90 day supply? [place]

## 2015-03-24 NOTE — Telephone Encounter (Signed)
RX sent to pharmacy. Pt informed.

## 2015-03-25 ENCOUNTER — Telehealth: Payer: Self-pay | Admitting: Cardiovascular Disease

## 2015-03-25 NOTE — Telephone Encounter (Signed)
Left message on pt's vm w/ Dr. Donivan Scull recommendation. Asked her to call back w/ any questions or concerns.

## 2015-03-25 NOTE — Telephone Encounter (Signed)
Should be okay from a cardiac perspective But she needs to check with Dr. Gilford Rile Too much voltaren cream can hurt the kidneys and her kidney number is up a little bit

## 2015-03-25 NOTE — Telephone Encounter (Signed)
Patient wants to know if she can take Voltaren gel with her heart medication. Its for her arthritis. Please call patient.

## 2015-04-15 ENCOUNTER — Ambulatory Visit: Payer: PPO | Admitting: Internal Medicine

## 2015-04-19 DIAGNOSIS — M1712 Unilateral primary osteoarthritis, left knee: Secondary | ICD-10-CM | POA: Diagnosis not present

## 2015-04-20 ENCOUNTER — Ambulatory Visit (INDEPENDENT_AMBULATORY_CARE_PROVIDER_SITE_OTHER): Payer: PPO

## 2015-04-20 DIAGNOSIS — I481 Persistent atrial fibrillation: Secondary | ICD-10-CM

## 2015-04-20 DIAGNOSIS — Z7901 Long term (current) use of anticoagulants: Secondary | ICD-10-CM

## 2015-04-20 DIAGNOSIS — I4891 Unspecified atrial fibrillation: Secondary | ICD-10-CM | POA: Diagnosis not present

## 2015-04-20 DIAGNOSIS — Z5181 Encounter for therapeutic drug level monitoring: Secondary | ICD-10-CM

## 2015-04-20 DIAGNOSIS — I4819 Other persistent atrial fibrillation: Secondary | ICD-10-CM

## 2015-04-20 LAB — POCT INR: INR: 2.5

## 2015-04-25 ENCOUNTER — Other Ambulatory Visit: Payer: Self-pay | Admitting: *Deleted

## 2015-04-25 DIAGNOSIS — I722 Aneurysm of renal artery: Secondary | ICD-10-CM

## 2015-04-26 ENCOUNTER — Other Ambulatory Visit: Payer: Self-pay | Admitting: *Deleted

## 2015-04-26 ENCOUNTER — Inpatient Hospital Stay: Admission: RE | Admit: 2015-04-26 | Payer: PPO | Source: Ambulatory Visit

## 2015-04-26 DIAGNOSIS — I442 Atrioventricular block, complete: Secondary | ICD-10-CM

## 2015-04-26 DIAGNOSIS — I4891 Unspecified atrial fibrillation: Secondary | ICD-10-CM

## 2015-04-26 DIAGNOSIS — I10 Essential (primary) hypertension: Secondary | ICD-10-CM

## 2015-04-26 MED ORDER — ATENOLOL 25 MG PO TABS
25.0000 mg | ORAL_TABLET | Freq: Every day | ORAL | Status: DC
Start: 2015-04-26 — End: 2016-05-30

## 2015-05-03 ENCOUNTER — Ambulatory Visit: Payer: PPO | Admitting: Vascular Surgery

## 2015-05-17 DIAGNOSIS — M1712 Unilateral primary osteoarthritis, left knee: Secondary | ICD-10-CM | POA: Diagnosis not present

## 2015-05-24 ENCOUNTER — Encounter: Payer: Self-pay | Admitting: Internal Medicine

## 2015-05-24 ENCOUNTER — Ambulatory Visit (INDEPENDENT_AMBULATORY_CARE_PROVIDER_SITE_OTHER): Payer: PPO | Admitting: Internal Medicine

## 2015-05-24 VITALS — BP 132/80 | HR 74 | Ht 67.0 in | Wt 140.0 lb

## 2015-05-24 DIAGNOSIS — J441 Chronic obstructive pulmonary disease with (acute) exacerbation: Secondary | ICD-10-CM

## 2015-05-24 DIAGNOSIS — J438 Other emphysema: Secondary | ICD-10-CM

## 2015-05-24 MED ORDER — PREDNISONE 20 MG PO TABS: 20.0000 mg | ORAL_TABLET | Freq: Every day | ORAL | Status: DC

## 2015-05-24 MED ORDER — FLUTICASONE-SALMETEROL 250-50 MCG/DOSE IN AEPB: 1.0000 | INHALATION_SPRAY | Freq: Two times a day (BID) | RESPIRATORY_TRACT | Status: DC

## 2015-05-24 MED ORDER — AZITHROMYCIN 250 MG PO TABS: ORAL_TABLET | ORAL | Status: AC

## 2015-05-24 NOTE — Assessment & Plan Note (Signed)
COPD C No wheezing on exam today, but with inc SOB and sputum production, will treat as AECOPD  Plan:  - continue with Advair 250/50, 1 puff twice a day, gargle and rinse after each use -Continue Spiriva -albuterol, advised to use prior to exercise - today with COPD exacerabation - Prednisone 20mg  x 5 days, and Zpak

## 2015-05-24 NOTE — Assessment & Plan Note (Signed)
Increased SOB and Sputum production of the last 2 days with fatigue.  AECOPD Plan - prednisone 20mg  x 5 days - zpak

## 2015-05-24 NOTE — Patient Instructions (Signed)
Follow up with Dr. Stevenson Clinch in:6 months - Prednisone 20mg  - take 1 tab daily with breakfast, for 5 days - Zpak - use as directed - cont with Advair 250/50 and spiriva - gargle and rinse after each use - STOP Advair 100/50

## 2015-05-24 NOTE — Progress Notes (Signed)
Review of Systems  Constitutional: Negative for fever, chills and weight loss.  HENT: Negative for ear discharge and ear pain.   Eyes: Negative for blurred vision.  Respiratory: Positive for shortness of breath. Negative for cough, hemoptysis, sputum production and wheezing.   Gastrointestinal: Negative for heartburn, nausea and vomiting.  Genitourinary: Negative for dysuria.  Skin: Negative for rash.  Neurological: Negative for headaches.     Subjective:    Patient ID: Jessica [name], female    DOB: [birth year], 82 y.o.   MRN: [id number]  Synopsis: Jessica [name] first saw the [facility] in 09/2011 for GOLD grade C COPD.  She smoked until 2001.  In 2013 she had spirometry which showed ratio 53%, FEV1 0.97 L (44% predicted) and an MMRC score of 1.  HPI  Chief Complaint  Patient presents with  . Follow-up    SOB w/exertion; prod cough w/yellow mucus; sneezing   Today's Clinic Visit: Patient presents today for a follow-up visit of her COPD. She states that since her last visit Advair 250/50 has been helping a lot however over the last 2 days she's had increased cough with thin yellow mucus production, sneezing and shortness of breath. She denies any fever chills nausea, vomiting, diarrhea. Overall she's been doing well until recently.    Past Medical History  Diagnosis Date  . Atrioventricular block, complete ([place])   . Cardiac pacemaker in situ   . Abdominal aneurysm without mention of rupture   . Hernia   . AAA (abdominal aortic aneurysm) ([place])   . Hypertension   . Hyperlipidemia           Objective:   Physical Exam Filed Vitals:   05/24/15 1100  BP: 132/80  Pulse: 74  Height: 5\' 7"  (1.702 m)  Weight: 140 lb (63.504 kg)  SpO2: 93%   RA  Gen: well appearing HENT: OP clear, TM's clear, neck supple PULM: Mild wheezing, normal respiratory effort, normal percussion CV: RRR, slight systolic murmur, trace edema GI: BS+, soft, nontender Derm: no  cyanosis or rash Psyche: normal mood and affect   The following images were reviewed by Dr. Stevenson Clinch 01/11/15 CT A/P 11/2014  FINDINGS: Lower chest: Lungs are clear. Heart size is normal. Pacemaker in place. Prominent left posterior diaphragmatic hernia containing only fat with only a 6 mm defect in the diaphragm.  Hepatobiliary: The liver is normal. Single tiny stone in the gallbladder. Biliary tree is otherwise normal.  Pancreas: Normal.  Spleen: Normal.  Adrenals/Urinary Tract: Adrenal glands are normal. Benign cysts in both kidneys, 20 mm in the left upper pole and 24 mm in the left lower pole with 13 and 7 mm cysts in the right mid kidney. Small stones in the lower pole collecting system of the right kidney. Ureters and bladder are normal.  Stomach/Bowel: Small hiatal hernia. Extensive diverticulosis of the sigmoid colon without diverticulitis.  Vascular/Lymphatic: Extensive aortic atherosclerosis. 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 The aneurysm measures 19 x 18 mm. There appears to be a fairly severe stenosis at the distal aspect of the aneurysm.  There appears to be a severe stenosis of the origin of the right renal artery.  Aorto bi-iliac graft in place.  Reproductive: Normal.  Other: No free air or free fluid. In there is a pseudo periumbilical hernia in the midline. The fascia is quite laxity in the midline with a portion of the transverse colon protruding along with a bulging and stretched linea alba.  Musculoskeletal:  No acute abnormalities.  IMPRESSION: 1. Extensive diverticulosis of the sigmoid portion of the colon. 2. 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 3. Possible severe proximal right renal artery stenosis.  November 2014 chest x-ray reviewed today clinic> emphysema bilaterally, mild mucus plugging  versus slight atelectasis in the lingula, pacemaker placed The most recent office notes from her cardiologist were reviewed in clinic and it looks that her A. fib has been stable with no changes needed as of November 2015    Assessment & Plan:   COPD (chronic obstructive pulmonary disease) COPD C No wheezing on exam today, but with inc SOB and sputum production, will treat as AECOPD  Plan:  - continue with Advair 250/50, 1 puff twice a day, gargle and rinse after each use -Continue Spiriva -albuterol, advised to use prior to exercise - today with COPD exacerabation - Prednisone 20mg  x 5 days, and Zpak   COPD exacerbation (HCC) Increased SOB and Sputum production of the last 2 days with fatigue.  AECOPD Plan - prednisone 20mg  x 5 days - zpak    Updated Medication List Outpatient Encounter Prescriptions as of 05/24/2015  Medication Sig  . albuterol (PROAIR HFA) 108 (90 BASE) MCG/ACT inhaler Inhale 2 puffs into the lungs every 6 (six) hours as needed for wheezing or shortness of breath.  Marland Kitchen atenolol (TENORMIN) 25 MG tablet Take 1 tablet (25 mg total) by mouth daily.  . Fluticasone-Salmeterol (ADVAIR DISKUS) 250-50 MCG/DOSE AEPB Inhale 1 puff into the lungs 2 (two) times daily.  . hydrochlorothiazide (HYDRODIURIL) 25 MG tablet Take one tablet by mouth daily as needed for swelling  . tiotropium (SPIRIVA) 18 MCG inhalation capsule Place 1 capsule (18 mcg total) into inhaler and inhale daily.  Marland Kitchen warfarin (COUMADIN) 5 MG tablet Take as directed by anticoagulation clinic  . [DISCONTINUED] Fluticasone-Salmeterol (ADVAIR DISKUS) 250-50 MCG/DOSE AEPB Inhale 1 puff into the lungs 2 (two) times daily.  Marland Kitchen azithromycin (ZITHROMAX Z-PAK) 250 MG tablet Take 2 tablets (500 mg) on  Day 1,  followed by 1 tablet (250 mg) once daily on Days 2 through 5.  . predniSONE (DELTASONE) 20 MG tablet Take 1 tablet (20 mg total) by mouth daily with breakfast.  . [DISCONTINUED] Fluticasone-Salmeterol (ADVAIR  DISKUS) 100-50 MCG/DOSE AEPB Inhale 1 puff into the lungs 2 (two) times daily.   No facility-administered encounter medications on file as of 05/24/2015.

## 2015-06-08 ENCOUNTER — Ambulatory Visit (INDEPENDENT_AMBULATORY_CARE_PROVIDER_SITE_OTHER): Payer: PPO

## 2015-06-08 DIAGNOSIS — I4891 Unspecified atrial fibrillation: Secondary | ICD-10-CM | POA: Diagnosis not present

## 2015-06-08 DIAGNOSIS — Z7901 Long term (current) use of anticoagulants: Secondary | ICD-10-CM | POA: Diagnosis not present

## 2015-06-08 DIAGNOSIS — I4819 Other persistent atrial fibrillation: Secondary | ICD-10-CM

## 2015-06-08 DIAGNOSIS — I481 Persistent atrial fibrillation: Secondary | ICD-10-CM

## 2015-06-08 DIAGNOSIS — Z5181 Encounter for therapeutic drug level monitoring: Secondary | ICD-10-CM

## 2015-06-08 LAB — POCT INR: INR: 3.3

## 2015-07-06 ENCOUNTER — Ambulatory Visit (INDEPENDENT_AMBULATORY_CARE_PROVIDER_SITE_OTHER): Payer: PPO

## 2015-07-06 DIAGNOSIS — Z7901 Long term (current) use of anticoagulants: Secondary | ICD-10-CM

## 2015-07-06 DIAGNOSIS — I4819 Other persistent atrial fibrillation: Secondary | ICD-10-CM

## 2015-07-06 DIAGNOSIS — Z5181 Encounter for therapeutic drug level monitoring: Secondary | ICD-10-CM | POA: Diagnosis not present

## 2015-07-06 DIAGNOSIS — I481 Persistent atrial fibrillation: Secondary | ICD-10-CM

## 2015-07-06 DIAGNOSIS — I4891 Unspecified atrial fibrillation: Secondary | ICD-10-CM | POA: Diagnosis not present

## 2015-07-06 LAB — POCT INR: INR: 3.1

## 2015-08-02 ENCOUNTER — Ambulatory Visit (INDEPENDENT_AMBULATORY_CARE_PROVIDER_SITE_OTHER): Payer: PPO

## 2015-08-02 ENCOUNTER — Encounter: Payer: Self-pay | Admitting: Internal Medicine

## 2015-08-02 ENCOUNTER — Ambulatory Visit (INDEPENDENT_AMBULATORY_CARE_PROVIDER_SITE_OTHER): Payer: PPO | Admitting: Internal Medicine

## 2015-08-02 VITALS — BP 158/90 | HR 70 | Ht 67.0 in | Wt 142.5 lb

## 2015-08-02 DIAGNOSIS — I481 Persistent atrial fibrillation: Secondary | ICD-10-CM

## 2015-08-02 DIAGNOSIS — I442 Atrioventricular block, complete: Secondary | ICD-10-CM | POA: Diagnosis not present

## 2015-08-02 DIAGNOSIS — I4819 Other persistent atrial fibrillation: Secondary | ICD-10-CM

## 2015-08-02 DIAGNOSIS — Z7901 Long term (current) use of anticoagulants: Secondary | ICD-10-CM | POA: Diagnosis not present

## 2015-08-02 DIAGNOSIS — Z5181 Encounter for therapeutic drug level monitoring: Secondary | ICD-10-CM

## 2015-08-02 DIAGNOSIS — Z950 Presence of cardiac pacemaker: Secondary | ICD-10-CM

## 2015-08-02 DIAGNOSIS — I4891 Unspecified atrial fibrillation: Secondary | ICD-10-CM | POA: Diagnosis not present

## 2015-08-02 LAB — POCT INR: INR: 1.9

## 2015-08-02 NOTE — Progress Notes (Signed)
      Patient Care Team: Jackolyn Confer, MD as PCP - General (Internal Medicine) Deboraha Sprang, MD (Cardiology) Minna Merritts, MD (Cardiology)   HPI  Jessica [name] is a 82 y.o. female seen in followup for a pacemaker implanted for complete heart block now status post device generator replacement.  She also has a history of paroxysmal atrial fibrillation and remains on Coumadin. We have in the past discussed NOACs. She has a CHADS VASC score of 6 ; she is a prior TIA. She has had no recurrent palpitations.  She's had no problems with chest pain. Shortness of breath is stable. There is no edema. She is largely without complaint  Past Medical History  Diagnosis Date  . Atrioventricular block, complete ([place])   . Cardiac pacemaker in situ   . Abdominal aneurysm without mention of rupture   . Hernia   . AAA (abdominal aortic aneurysm) ([place])   . Hypertension   . Hyperlipidemia     Past Surgical History  Procedure Laterality Date  . Ohter      growth on colon surgery  . Tonsillectomy    . Abdominal aortic aneurysm repair    . Hernia repair      Current Outpatient Prescriptions  Medication Sig Dispense Refill  . albuterol (PROAIR HFA) 108 (90 BASE) MCG/ACT inhaler Inhale 2 puffs into the lungs every 6 (six) hours as needed for wheezing or shortness of breath. 1 Inhaler 5  . atenolol (TENORMIN) 25 MG tablet Take 1 tablet (25 mg total) by mouth daily. 180 tablet 3  . Fluticasone-Salmeterol (ADVAIR DISKUS) 250-50 MCG/DOSE AEPB Inhale 1 puff into the lungs 2 (two) times daily. 180 each 2  . hydrochlorothiazide (HYDRODIURIL) 25 MG tablet Take one tablet by mouth daily as needed for swelling 30 tablet 3  . tiotropium (SPIRIVA) 18 MCG inhalation capsule Place 1 capsule (18 mcg total) into inhaler and inhale daily. 60 capsule 2  . warfarin (COUMADIN) 5 MG tablet Take as directed by anticoagulation clinic 100 tablet 1   No current facility-administered medications for this visit.      Allergies  Allergen Reactions  . Codeine     Made her feel crazy  . Morphine     Made her feel crazy  . Zyrtec [Cetirizine]     Causes facial numbness    Review of Systems negative except from HPI and PMH  Physical Exam BP 158/90 mmHg  Pulse 70  Ht 5\' 7"  (1.702 m)  Wt 142 lb 8 oz (64.638 kg)  BMI 22.31 kg/m2 Well developed and well nourished in no acute distress HENT normal E scleral and icterus clear Neck Supple Clear to ausculation  Regular rate and rhythm, no murmurs gallops or rub Soft with active bowel sounds No clubbing cyanosis no  Edema Alert and oriented, grossly normal motor and sensory function Skin Warm and Dry  ECG demonstrates atrial fibrillation with some ventricular pacing and pseudofusion.  Intervals -/18/48  Assessment and  Plan  Atrial fibrillation-permanent  Hypertension    AV block-complete -intermittent  Pacemaker-St. Jude The patient's device was interrogated.  The information was reviewed. No changes were made in the programming.     Predominantly Vpaced   Atrial fib now permanent-- will reprogram device to VVIR   BP elevated;  Will have her record it weekly and followup with her PCP about this  AF.

## 2015-08-02 NOTE — Patient Instructions (Signed)

## 2015-08-08 LAB — CUP PACEART INCLINIC DEVICE CHECK
Battery Impedance: 4600 Ohm
Battery Voltage: 2.72 V
Date Time Interrogation Session: 20170711133347
Implantable Lead Implant Date: 20090610
Implantable Lead Implant Date: 20090610
Implantable Lead Location: 753859
Implantable Lead Location: 753860
Implantable Lead Model: 5076
Lead Channel Impedance Value: 470 Ohm
Lead Channel Setting Pacing Pulse Width: 0.5 ms
Lead Channel Setting Sensing Sensitivity: 2 mV
Pulse Gen Model: 5826
Pulse Gen Serial Number: 1236137

## 2015-08-12 ENCOUNTER — Emergency Department: Payer: PPO

## 2015-08-12 ENCOUNTER — Emergency Department
Admission: EM | Admit: 2015-08-12 | Discharge: 2015-08-12 | Disposition: A | Discharge status: Home / Self Care | Payer: PPO | Attending: Emergency Medicine | Admitting: Emergency Medicine

## 2015-08-12 DIAGNOSIS — E785 Hyperlipidemia, unspecified: Secondary | ICD-10-CM | POA: Diagnosis not present

## 2015-08-12 DIAGNOSIS — R109 Unspecified abdominal pain: Secondary | ICD-10-CM | POA: Diagnosis not present

## 2015-08-12 DIAGNOSIS — Z7951 Long term (current) use of inhaled steroids: Secondary | ICD-10-CM | POA: Insufficient documentation

## 2015-08-12 DIAGNOSIS — Z950 Presence of cardiac pacemaker: Secondary | ICD-10-CM | POA: Diagnosis not present

## 2015-08-12 DIAGNOSIS — I701 Atherosclerosis of renal artery: Secondary | ICD-10-CM | POA: Diagnosis not present

## 2015-08-12 DIAGNOSIS — Z7901 Long term (current) use of anticoagulants: Secondary | ICD-10-CM | POA: Insufficient documentation

## 2015-08-12 DIAGNOSIS — M549 Dorsalgia, unspecified: Secondary | ICD-10-CM | POA: Diagnosis not present

## 2015-08-12 DIAGNOSIS — I10 Essential (primary) hypertension: Secondary | ICD-10-CM | POA: Insufficient documentation

## 2015-08-12 DIAGNOSIS — Z87891 Personal history of nicotine dependence: Secondary | ICD-10-CM | POA: Diagnosis not present

## 2015-08-12 DIAGNOSIS — R339 Retention of urine, unspecified: Secondary | ICD-10-CM | POA: Diagnosis not present

## 2015-08-12 LAB — BASIC METABOLIC PANEL
Anion gap: 8 (ref 5–15)
BUN: 19 mg/dL (ref 6–20)
CO2: 27 mmol/L (ref 22–32)
Calcium: 9.1 mg/dL (ref 8.9–10.3)
Chloride: 101 mmol/L (ref 101–111)
Creatinine, Ser: 1.36 mg/dL — ABNORMAL HIGH (ref 0.44–1.00)
GFR calc Af Amer: 40 mL/min — ABNORMAL LOW (ref >60)
GFR calc non Af Amer: 35 mL/min — ABNORMAL LOW (ref >60)
Glucose, Bld: 115 mg/dL — ABNORMAL HIGH (ref 65–99)
Potassium: 4 mmol/L (ref 3.5–5.1)
Sodium: 136 mmol/L (ref 135–145)

## 2015-08-12 LAB — CBC
HCT: 40.7 % (ref 35.0–47.0)
Hemoglobin: 14.1 g/dL (ref 12.0–16.0)
MCH: 33.2 pg (ref 26.0–34.0)
MCHC: 34.6 g/dL (ref 32.0–36.0)
MCV: 95.8 fL (ref 80.0–100.0)
Platelets: 131 10*3/uL — ABNORMAL LOW (ref 150–440)
RBC: 4.24 MIL/uL (ref 3.80–5.20)
RDW: 14.7 % — ABNORMAL HIGH (ref 11.5–14.5)
WBC: 12.1 10*3/uL — ABNORMAL HIGH (ref 3.6–11.0)

## 2015-08-12 LAB — URINALYSIS COMPLETE WITH MICROSCOPIC (ARMC ONLY)
Bacteria, UA: NONE SEEN
Bilirubin Urine: NEGATIVE
Glucose, UA: NEGATIVE mg/dL
Ketones, ur: NEGATIVE mg/dL
Leukocytes, UA: NEGATIVE
Nitrite: NEGATIVE
Protein, ur: NEGATIVE mg/dL
Specific Gravity, Urine: 1.031 — ABNORMAL HIGH (ref 1.005–1.030)
pH: 6 (ref 5.0–8.0)

## 2015-08-12 LAB — LIPASE, BLOOD: Lipase: 18 U/L (ref 11–51)

## 2015-08-12 LAB — HEPATIC FUNCTION PANEL
ALT: 13 U/L — ABNORMAL LOW (ref 14–54)
AST: 23 U/L (ref 15–41)
Albumin: 4 g/dL (ref 3.5–5.0)
Alkaline Phosphatase: 86 U/L (ref 38–126)
Bilirubin, Direct: 0.1 mg/dL (ref 0.1–0.5)
Indirect Bilirubin: 1.6 mg/dL — ABNORMAL HIGH (ref 0.3–0.9)
Total Bilirubin: 1.7 mg/dL — ABNORMAL HIGH (ref 0.3–1.2)
Total Protein: 6.8 g/dL (ref 6.5–8.1)

## 2015-08-12 MED ORDER — IOPAMIDOL (ISOVUE-370) INJECTION 76%
75.0000 mL | Freq: Once | INTRAVENOUS | Status: AC | PRN
  Administered 2015-08-12: 75 mL via INTRAVENOUS

## 2015-08-12 MED ORDER — ACETAMINOPHEN 500 MG PO TABS
1000.0000 mg | ORAL_TABLET | ORAL | Status: AC
  Administered 2015-08-12: 1000 mg via ORAL
  Filled 2015-08-12: qty 2

## 2015-08-12 NOTE — ED Notes (Addendum)
Pt presents from [facility] via [facility] for L sided pain and urinary retention. Nurse at [facility] stated to EMS that pt will dribble some urine, but hasn't fully urinated in 24 hours. Denies UTI or kidney stone hx. States yesterday pain with urination.

## 2015-08-12 NOTE — ED Provider Notes (Signed)
[facility] Emergency Department Provider Note  ____________________________________________  Time seen: Approximately 7:10 AM  I have reviewed the triage vital signs and the nursing notes.  HISTORY  Chief Complaint Flank Pain and Urinary Retention  HPI Jessica [name] is a 82 y.o. female history notable for previous abdominal aortic aneurysm, hernia repair, cardiac pacemaker due to AV block.  The patient reports yesterday when she got up she urinated, it felt slightly strange and then she began experiencing discomfort which she says is moderate sharp pain over her left flank.  This is not associated with any nausea or vomiting. Normal bowel movements. No fevers or chills. No chest pain or shortness of breath. She is taking Tylenol, last one yesterday morning and reports it helped some.  She doesn't history of an aneurysm repair several years ago. She has not had complications from that. She denies any ripping tearing or moving pain. No numbness tingling or weakness in the hands arms or legs.  No previous history of kidney stones.  Past Medical History  Diagnosis Date  . Atrioventricular block, complete ([place])   . Cardiac pacemaker in situ   . Abdominal aneurysm without mention of rupture   . Hernia   . AAA (abdominal aortic aneurysm) ([place])   . Hypertension   . Hyperlipidemia     Patient Active Problem List   Diagnosis Date Noted  . COPD exacerbation ([place]) 05/24/2015  . Injury of right lower extremity and cellulitis 11/29/2014  . 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 Renal artery stenosis ([place]) 09/15/2014  . 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 CN (constipation) 09/06/2014  . Change in stool caliber 09/06/2014  . 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 Shortness of breath 05/12/2014  . Conjunctival hemorrhage of right eye 08/13/2013  . Medicare annual wellness visit, subsequent 04/22/2013  . Encounter for  therapeutic drug monitoring 03/04/2013  . Allergic rhinitis 11/26/2012  . COPD (chronic obstructive pulmonary disease) ([place]) 09/27/2011  . Hyperlipidemia 02/22/2011  . Depression 02/22/2011  . Long term current use of anticoagulant 04/12/2010  . ANXIETY STATE, UNSPECIFIED 09/07/2009  . HYPERTENSION, BENIGN 08/25/2009  . ATRIAL FIBRILLATION 03/07/2009  . CAROTID ARTERY STENOSIS, WITHOUT INFARCTION 08/24/2008  . AV BLOCK, COMPLETE 06/17/2008  . ABDOMINAL AORTIC ANEURYSM 06/17/2008  . PACEMAKER-St.Jude 06/17/2008  Noted previous renal artery aneurysm as well  Past Surgical History  Procedure Laterality Date  . Ohter      growth on colon surgery  . Tonsillectomy    . Abdominal aortic aneurysm repair    . Hernia repair      Current Outpatient Rx  Name  Route  Sig  Dispense  Refill  . albuterol (PROAIR HFA) 108 (90 BASE) MCG/ACT inhaler   Inhalation   Inhale 2 puffs into the lungs every 6 (six) hours as needed for wheezing or shortness of breath.   1 Inhaler   5   . atenolol (TENORMIN) 25 MG tablet   Oral   Take 1 tablet (25 mg total) by mouth daily.   180 tablet   3   . Fluticasone-Salmeterol (ADVAIR DISKUS) 250-50 MCG/DOSE AEPB   Inhalation   Inhale 1 puff into the lungs 2 (two) times daily.   180 each   2   . hydrochlorothiazide (HYDRODIURIL) 25 MG tablet      Take one tablet by mouth daily as needed for swelling   30 tablet   3   . tiotropium (SPIRIVA) 18 MCG inhalation capsule  Inhalation   Place 1 capsule (18 mcg total) into inhaler and inhale daily.   60 capsule   2   . warfarin (COUMADIN) 5 MG tablet      Take as directed by anticoagulation clinic   100 tablet   1     90 day supply     Allergies Codeine; Morphine; and Zyrtec  Family History  Problem Relation Age of Onset  . Heart disease Father   . Alcohol abuse Father   . Cancer Brother     Social History Social History  Substance Use Topics  . Smoking status: Former Smoker -- 1.50  packs/day for 50 years    Types: Cigarettes    Quit date: 10/03/1995  . Smokeless tobacco: Never Used  . Alcohol Use: 0.0 oz/week    0 Standard drinks or equivalent per week     Comment: occasionally    Review of Systems Constitutional: No fever/chills. No falls or injuries. Eyes: No visual changes. ENT: No sore throat. Cardiovascular: Denies chest pain. Respiratory: Denies shortness of breath. Gastrointestinal: No abdominal pain, but she does point over her left flank stating that her side hurts.  No nausea, no vomiting.  No diarrhea.  No constipation. Genitourinary: No burning with urination, but explains abnormal sensation with urination yesterday just prior to beginning to have pain. Has no foul odor. Musculoskeletal: Negative for back pain. Skin: Negative for rash. Neurological: Negative for headaches, focal weakness or numbness.  10-point ROS otherwise negative.  ____________________________________________   PHYSICAL EXAM:  VITAL SIGNS: ED Triage Vitals  Enc Vitals Group     BP 08/12/15 0704 162/78 mmHg     Pulse Rate 08/12/15 0704 70     Resp 08/12/15 0704 16     Temp 08/12/15 0704 97.1 F (36.2 C)     Temp Source 08/12/15 0704 Oral     SpO2 08/12/15 0704 97 %     Weight 08/12/15 0704 144 lb (65.318 kg)     Height 08/12/15 0704 5\' 7"  (1.702 m)     Head Cir --      Peak Flow --      Pain Score 08/12/15 0705 7     Pain Loc --      Pain Edu? --      Excl. in [place]? --    Constitutional: Alert and oriented. Well appearing and in no acute distress.Accompanied by her son. Both patient and family very pleasant. Eyes: Conjunctivae are normal. PERRL. EOMI. Head: Atraumatic. Nose: No congestion/rhinnorhea. Mouth/Throat: Mucous membranes are moist.   Neck: No stridor.   Cardiovascular: Normal rate, regular rhythm. Grossly normal heart sounds.  Normal dorsalis pedis pulses lower extremities bilateral. Respiratory: Normal respiratory effort.  Clear  bilateral. Gastrointestinal: Soft and nontender except for some mild worsening of her pain in the left flank when palpated in the left flank area. No rebound guarding or peritonitis.. No distention. No CVA tenderness. Musculoskeletal: No lower extremity tenderness nor edema.   Neurologic:  Normal speech and language. No gross focal neurologic deficits are appreciated. Skin:  Skin is warm, dry and intact. No rash noted. Psychiatric: Mood and affect are normal. Speech and behavior are normal.  ____________________________________________   LABS (all labs ordered are listed, but only abnormal results are displayed)  Labs Reviewed  URINALYSIS COMPLETEWITH MICROSCOPIC ([facility] ONLY) - Abnormal; Notable for the following:    Color, Urine YELLOW (*)    APPearance CLEAR (*)    Specific Gravity, Urine 1.031 (*)  Hgb urine dipstick 1+ (*)    Squamous Epithelial / LPF 0-5 (*)    All other components within normal limits  BASIC METABOLIC PANEL - Abnormal; Notable for the following:    Glucose, Bld 115 (*)    Creatinine, Ser 1.36 (*)    GFR calc non Af Amer 35 (*)    GFR calc Af Amer 40 (*)    All other components within normal limits  CBC - Abnormal; Notable for the following:    WBC 12.1 (*)    RDW 14.7 (*)    Platelets 131 (*)    All other components within normal limits  HEPATIC FUNCTION PANEL - Abnormal; Notable for the following:    ALT 13 (*)    Total Bilirubin 1.7 (*)    Indirect Bilirubin 1.6 (*)    All other components within normal limits  LIPASE, BLOOD   ____________________________________________  EKG  Reviewed and interpreted by me at 7:40 AM Heart rate 70 Care is 150 QTc 480 Ventricular paced Underlying atrial fibrillation, chronic. ____________________________________________  RADIOLOGY  CT CTA Abd/Pel w/cm &/or w/o cm (Final result) Result time: 08/12/15 09:15:53   Final result by Rad Results In Interface (08/12/15 09:15:53)   Narrative:   CLINICAL DATA:  Left-sided abdominal pain and nausea. History of abdominal aortic aneurysm repair.  EXAM: CT ANGIOGRAPHY ABDOMEN AND PELVIS WITH CONTRAST AND WITHOUT CONTRAST  TECHNIQUE: Multidetector CT imaging of the abdomen and pelvis was performed using the standard protocol during bolus administration of intravenous contrast. Multiplanar reconstructed images and MIPs were obtained and reviewed to evaluate the vascular anatomy.  CONTRAST: 75 mL Isovue 370 IV  COMPARISON: CT of the abdomen and pelvis on 09/09/2014  FINDINGS: Vascular Findings:  Stable and normally patent aorto bi-iliac bypass graft. The native aorta superior to the graft shows stable calcified plaque without evidence of aneurysm. The celiac axis is normally patent. There is mild plaque at the origin of the superior mesenteric artery without evidence of significant stenosis. IMA sacrificed by prior bypass grafting.  Two separate right renal arteries are present with a patent small accessory artery supplying the upper pole. The inferior right renal artery demonstrates calcified plaque at its origin causing moderate range stenosis of approximately 50- 70%. There is stable partially thrombosed aneurysmal disease at the origin of the left renal artery measuring approximately 17 mm. Just along the distal aspect of the aneurysm stenosis of the left renal artery again noted of approximately 70- 75%. Initial arterial and venous phase imaging do suggest that there is decreased flow to the left kidney compared to the right without evidence of infarction or significant atrophy. Native distal common, internal and external iliac arteries are normally patent bilaterally. The common femoral arteries and femoral bifurcation show normal patency. No venous findings on the delayed venous phase imaging.  Nonvascular Findings:  Lower chest: Visualized lower lung bases are unremarkable.  Hepatobiliary: The liver and gallbladder have a  normal appearance.  Pancreas: Normal appearance of the pancreas.  Spleen: Normal size and appearance of the spleen.  Adrenals/Urinary Tract: Normal adrenal glands. Stable bilateral renal cysts. No evidence of solid renal masses or calculi.  Stomach/Bowel: No evidence of bowel obstruction. There is an anastomosis of the ascending colon likely related to partial right hemicolectomy. No free air. Sigmoid diverticulosis without evidence of diverticulitis.  Lymphatic: No lymphadenopathy identified.  Reproductive: The uterus and adnexal regions are unremarkable.  Other: Ventral hernia mesh present with some laxity of the midline abdominal wall but  no true recurrent ventral hernia. There are small bilateral inguinal hernias containing fat.  Musculoskeletal: Degenerative spondylosis noted of the lumbar spine, most prominently at L3-4 and L4-5. No bony lesions or fractures identified.  Review of the MIP images confirms the above findings.  IMPRESSION: 1. Normally patent aorto bi-iliac bypass graft. 2. 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 There is associated significant stenosis of the renal artery just along the distal margin of the aneurysm causing visible decrease in flow to the left kidney compared to the right without infarction or significant renal atrophy. 3. Approximately 50- 70% moderate stenosis of the proximal inferior right renal artery. A small superior accessory right renal artery supplies the upper pole. 4. No significant occlusive disease of the celiac axis or superior mesenteric artery.   Electronically Signed By: Aletta Edouard M.D. On: 08/12/2015 09:15      CT scan reviewed in detail with Dr. Leotis Pain as Dr. Kellie Simmering was not on-call today and tone. In addition he was also discussed with Dr. Nechama Guard (spelling), on-call for [facility] vascular. Both physicians appear to be in agreement that there is no acute  intervention required at this time, but they recommend outpatient follow-up within the next week with vascular surgery. Patient her family both agreement ____________________________________________   PROCEDURES  Procedure(s) performed: None  Critical Care performed: No  ____________________________________________   INITIAL IMPRESSION / ASSESSMENT AND PLAN / ED COURSE  Pertinent labs & imaging results that were available during my care of the patient were reviewed by me and considered in my medical decision making (see chart for details).  Differential diagnosis includes but is not limited to, abdominal perforation, aortic dissection, cholecystitis, appendicitis, diverticulitis, colitis, esophagitis/gastritis, kidney stone, pyelonephritis, urinary tract infection, aortic aneurysm. All are considered in decision and treatment plan. Based upon the patient's presentation and risk factors, we'll proceed with CT angiography of the abdomen and pelvis. Although I suspect this may be due to a renal etiology given her symptomatology, she does have a history of a prior aortic aneurysm and certainly we wished to rule out any sort of leak or complication. She also has a renal artery aneurysm.  She does not have any chest symptoms. Completely neurologically intact. Afebrile with stable vitals. She reports her pain is mild right now, and that Tylenol works well for her pain. She does not wish for anything stronger.  ----------------------------------------- 12:17 PM on 08/12/2015 -----------------------------------------  Patient reports mild improvement in her pain after Tylenol, still has a mild aching in the left flank. Review of her CT scan, I'm concerned about the possibility of acute ischemia involving the left kidney. I have paged Dr. Kellie Simmering, the patient knows, for consultation from vascular surgery at [facility] for further recommendation.  Vascular surgery recommended outpatient follow-up. I  find no other clear oxygenation for abdominal/left sided pain, but suspect it may be due to some mild ischemia involving the left kidney. Discussed with the patient and her son, both are in agreement with close return precautions and setting up follow-up this coming week with vascular surgery, likely with Dr. Kellie Simmering though if he is unable to see her next week they will call Dr. Lucky Cowboy office for close follow-up. ____________________________________________   FINAL CLINICAL IMPRESSION(S) / ED DIAGNOSES  Final diagnoses:  Renal artery stenosis (HCC)      Delman Kitten, MD 08/12/15 1400

## 2015-08-12 NOTE — Discharge Instructions (Signed)
Based on your pain today, we are concerned this may be from poor blood flow to the left kidney.  Please call Dr. Kellie Simmering or Dr. Bunnie Domino office for close follow-up with vascular surgery within a week.  Please return to the emergency room right away if you are to develop a fever, severe nausea, your pain becomes severe or worsens, you are unable to keep food down, begin vomiting any dark or bloody fluid, you develop any dark or bloody stools, feel dehydrated, or other new concerns or symptoms arise.  Please continue your current medications.  Renal Artery Stenosis Renal artery stenosis (RAS) is narrowing of the artery that carries blood to your kidneys. It can affect one or both kidneys. Your kidneys filter waste and extra fluid from your blood. You get rid of the waste and fluid when you urinate. Your kidneys also make an important chemical messenger (hormone) called renin. Renin helps regulate your blood pressure. The first sign of RAS may be high blood pressure. Over time, other symptoms can develop. CAUSES  Plaque buildup in your arteries (atherosclerosis) is the main cause of RAS. The plaques that cause this are made up of:  Fat.  Cholesterol.  Calcium.  Other substances. As these substances build up in your renal artery, this slows the blood supply to your kidneys. The lack of blood and oxygen causes the signs and symptoms of RAS. A much less common cause of RAS is a disease called fibromuscular dysplasia. This disease causes abnormal cell growth that narrows the renal artery. It is not related to atherosclerosis. It occurs mostly in women who are 29-64 years old. It may be passed down through families. RISK FACTORS  You may be at risk for renal artery stenosis if you:  Are a man who is at least 80 years old.  Are a woman who is at least 80 years old.  Have high blood pressure.  Have high cholesterol.  Are a smoker.  Abuse alcohol.  Have diabetes or prediabetes.  Are  overweight.  Have a family history of early heart disease. SIGNS AND SYMPTOMS  RAS usually develops slowly. You may not have any signs or symptoms at first. The earliest signs may be:  Developing high blood pressure.  A sudden increase in existing high blood pressure.  No longer responding to medicine that used to control your blood pressure. Later signs and symptoms are due to kidney damage. They may include:  Fatigue.  Shortness of breath.  Swollen legs and feet.  Dry skin.  Headaches.  Muscle cramps.  Loss of appetite.  Nausea or vomiting. DIAGNOSIS  Your health care provider may suspect RAS based on changes in your blood pressure and your risk factors. A physical exam will be done. Your health care provider may use a stethoscope to listen for a whooshing sound (bruit) that can occur where the renal artery is blocking blood flow. Several tests may be done to confirm a diagnosis of RAS. These may include:  Blood and urine tests to check your kidney function.  Imaging tests of your kidneys, such as:  A test that involves using sound waves to create an image of your kidneys and the blood flow to your kidneys (ultrasound).  A test in which dye is injected into one of your blood vessels so images can be taken as the dye flows through your renal arteries (angiogram). These tests can be done using X-rays, a CT scan (computed tomography angiogram, CTA), or a type of MRI (magnetic  resonance angiogram, MRA). TREATMENT  Making lifestyle changes to reduce your risk factors is the first treatment option for early RAS. If the blood flow to one of your kidneys is cut by more than half, you may need medicine to:  Lower your blood pressure. This is the main medical treatment for RAS. You may need more than one type of medicine for this. The two types that work best for RAS are:  ACE inhibitors.  Angiotensin receptor blockers.  Reduce fluid in the body (diuretics).  Lower your  cholesterol (statins). If medicine is not enough to control RAS, you may need surgery. This may involve:  Threading a tube with an inflatable balloon into the renal artery to force it open (angioplasty).  Removing plaque from inside the artery (endarterectomy). HOME CARE INSTRUCTIONS  Take medicines only as directed by your health care provider.  Make any lifestyle changes recommended by your health care provider. This may include:  Working with a dietitian to maintain a heart-healthy diet. This type of diet is low in saturated fat, salt, and added sugar.  Starting an exercise program as directed by your health care provider.  Maintaining a healthy weight.  Quitting smoking.  Not abusing alcohol.  Keep all follow-up visits as directed by your health care provider. This is important. SEEK MEDICAL CARE IF:  Your symptoms of RAS are not getting better.  Your symptoms are changing or getting worse. SEEK IMMEDIATE MEDICAL CARE IF:  You have very bad pain in your back or abdomen.  You have blood in your urine.   This information is not intended to replace advice given to you by your health care provider. Make sure you discuss any questions you have with your health care provider.   Document Released: 10/04/2004 Document Revised: 01/29/2014 Document Reviewed: 04/23/2013 Elsevier Interactive Patient Education [employer].

## 2015-08-17 ENCOUNTER — Ambulatory Visit (INDEPENDENT_AMBULATORY_CARE_PROVIDER_SITE_OTHER): Payer: PPO | Admitting: Cardiovascular Disease

## 2015-08-17 ENCOUNTER — Encounter: Payer: Self-pay | Admitting: Cardiovascular Disease

## 2015-08-17 VITALS — BP 144/80 | HR 70 | Ht 67.0 in | Wt 140.0 lb

## 2015-08-17 DIAGNOSIS — I701 Atherosclerosis of renal artery: Secondary | ICD-10-CM

## 2015-08-17 DIAGNOSIS — I70309 Unspecified atherosclerosis of unspecified type of bypass graft(s) of the extremities, unspecified extremity: Secondary | ICD-10-CM

## 2015-08-17 DIAGNOSIS — I4891 Unspecified atrial fibrillation: Secondary | ICD-10-CM

## 2015-08-17 DIAGNOSIS — I722 Aneurysm of renal artery: Secondary | ICD-10-CM

## 2015-08-17 DIAGNOSIS — I10 Essential (primary) hypertension: Secondary | ICD-10-CM

## 2015-08-17 DIAGNOSIS — E785 Hyperlipidemia, unspecified: Secondary | ICD-10-CM

## 2015-08-17 DIAGNOSIS — J438 Other emphysema: Secondary | ICD-10-CM

## 2015-08-17 DIAGNOSIS — Z950 Presence of cardiac pacemaker: Secondary | ICD-10-CM

## 2015-08-17 MED ORDER — ROSUVASTATIN CALCIUM 5 MG PO TABS: 5.0000 mg | ORAL_TABLET | Freq: Every day | ORAL | 11 refills | Status: DC

## 2015-08-17 NOTE — Patient Instructions (Addendum)
Medication Instructions:   Please start crestor one pill a day for blockage prevention  We will call Dr. Lucky Cowboy to make an appt for renal artery stenosis Monday, August 7 @ 1:30 pm at  [facility]  Address: [address] [place], [place]  [phone]  [phone]   Follow-Up: It was a pleasure seeing you in the office today. Please call us if you have new issues that need to be addressed before your next appt.  [phone]  Your physician wants you to follow-up in: 6 months.  You will receive a reminder letter in the mail two months in advance. If you don't receive a letter, please call our office to schedule the follow-up appointment.  If you need a refill on your cardiac medications before your next appointment, please call your pharmacy.

## 2015-08-17 NOTE — Progress Notes (Signed)
Cardiology Office Note  Date:  08/17/2015   ID:  Jessica [name], DOB 12-13-1929, MRN [id number]  PCP:  Rica Mast, MD   Chief Complaint  Patient presents with  . Other    1 yr f/u c/o recently went to hospital due to left lower quadrant pain dx renal artery stenosis. Meds reviewed verbally with pt.    HPI:  [name] is a very pleasant 82 year old woman with a history of peripheral vascular disease, status post open abdominal aortic aneurysm repair in 2009,  long smoking history who stopped >15 years ago , history of complete heart block status post pacemaker implant, herna repair in 2009 with post op atrial fib, HTN, prior TIA per the notes, carotid u/s showing mild carotid disease in 2011 with atypical vessel on the left contributing to her bruit, previous pains in her neck radiating to her arms bilaterally occurring at night and awakening her from sleep, relieved by repositioning herself in bed. She presents for routine follow up of her PAD he has COPD, 50 years of smoking.  Currently lives at the [facility] Denies any chest pain concerning for angina In follow-up today, she reports that she developed severe Left flank pain on Thursday, 5 days ago Jessica [name] to ER on Friday August 12 2015 CT scan results as below showing significant stenosis of left renal artery after aneurysm Left flank pain has resolved, she was instructed to see vascular surgery in follow-up Creatinine 1.36, WBC 12  She reports that she takes HCTZ only once in a while  CT ABD: 1. Normally patent aorto bi-iliac bypass graft. 2. 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 There is associated significant stenosis of the renal artery just along the distal margin of the aneurysm causing visible decrease in flow to the left kidney compared to the right without infarction or significant renal atrophy. 3. Approximately 50- 70% moderate stenosis of the proximal inferior right renal  artery. A small superior accessory right renal artery supplies the upper pole. 4. No significant occlusive disease of the celiac axis or superior mesenteric artery.   At baseline has some balance instability, worse with climbing stairs. She does not do any regular exercise. She does stay active performing her own ADLs. chronic pain in her back  EKG on today's visit shows paced rhythm with rate 70 bpm  Other past medical history Last Myoview October 2012 showing no ischemia  PMH:   has a past medical history of AAA (abdominal aortic aneurysm) ([place]); Abdominal aneurysm without mention of rupture; Atrioventricular block, complete ([place]); Cardiac pacemaker in situ; Hernia; Hyperlipidemia; and Hypertension.  PSH:    Past Surgical History:  Procedure Laterality Date  . ABDOMINAL AORTIC ANEURYSM REPAIR    . HERNIA REPAIR    . ohter     growth on colon surgery  . TONSILLECTOMY      Current Outpatient Prescriptions  Medication Sig Dispense Refill  . albuterol (PROAIR HFA) 108 (90 BASE) MCG/ACT inhaler Inhale 2 puffs into the lungs every 6 (six) hours as needed for wheezing or shortness of breath. 1 Inhaler 5  . atenolol (TENORMIN) 25 MG tablet Take 1 tablet (25 mg total) by mouth daily. 180 tablet 3  . Fluticasone-Salmeterol (ADVAIR DISKUS) 250-50 MCG/DOSE AEPB Inhale 1 puff into the lungs 2 (two) times daily. 180 each 2  . hydrochlorothiazide (HYDRODIURIL) 25 MG tablet Take one tablet by mouth daily as needed for swelling 30 tablet 3  . tiotropium (SPIRIVA) 18  MCG inhalation capsule Place 1 capsule (18 mcg total) into inhaler and inhale daily. 60 capsule 2  . warfarin (COUMADIN) 5 MG tablet Take as directed by anticoagulation clinic 100 tablet 1  . rosuvastatin (CRESTOR) 5 MG tablet Take 1 tablet (5 mg total) by mouth daily. 30 tablet 11   No current facility-administered medications for this visit.      Allergies:   Codeine; Morphine; and Zyrtec [cetirizine]   Social History:   The patient  reports that she quit smoking about 19 years ago. Her smoking use included Cigarettes. She has a 75.00 pack-year smoking history. She has never used smokeless tobacco. She reports that she drinks alcohol. She reports that she does not use drugs.   Family History:   family history includes Alcohol abuse in her father; Cancer in her brother; Heart disease in her father.    Review of Systems: Review of Systems  Constitutional: Negative.   Respiratory: Negative.   Cardiovascular: Negative.   Gastrointestinal: Negative.   Musculoskeletal: Positive for back pain and joint pain.       Left flank pain, now resolved  Neurological: Negative.   Psychiatric/Behavioral: Negative.   All other systems reviewed and are negative.    PHYSICAL EXAM: VS:  BP (!) 144/80 (BP Location: Left Arm, Patient Position: Sitting, Cuff Size: Normal)   Pulse 70   Ht 5\' 7"  (1.702 m)   Wt 140 lb (63.5 kg)   BMI 21.93 kg/m  , BMI Body mass index is 21.93 kg/m. GEN: Well nourished, well developed, in no acute distress  HEENT: normal  Neck: no JVD, carotid bruits, or masses Cardiac: RRR; no murmurs, rubs, or gallops,no edema  Respiratory:  clear to auscultation bilaterally, normal work of breathing GI: soft, nontender, nondistended, + BS MS: no deformity or atrophy  Skin: warm and dry, no rash Neuro:  Strength and sensation are intact Psych: euthymic mood, full affect    Recent Labs: 08/12/2015: ALT 13; BUN 19; Creatinine, Ser 1.36; Hemoglobin 14.1; Platelets 131; Potassium 4.0; Sodium 136    Lipid Panel Lab Results  Component Value Date   CHOL 148 01/30/2013   HDL 46.30 01/30/2013   LDLCALC 78 01/30/2013   TRIG 117.0 01/30/2013      Wt Readings from Last 3 Encounters:  08/17/15 140 lb (63.5 kg)  08/12/15 144 lb (65.3 kg)  08/02/15 142 lb 8 oz (64.6 kg)       ASSESSMENT AND PLAN:  Atrial fibrillation, unspecified type ([place]) - Plan: EKG 12-Lead Currently on anticoagulation,  paced rhythm, asymptomatic No signs of heart failure  HYPERTENSION, BENIGN Blood pressure is well controlled on today's visit. No changes made to the medications.  Renal artery aneurysm (HCC) Aneurysm and stenosis detailed on recent CT scan We'll refer to vascular surgery for further evaluation  Renal artery stenosis (HCC) CT suggesting significant stenosis after the aneurysm, unclear if she would be a candidate for stent. Appointment made with Dr. Dew/schneir  Atherosclerosis of aorto-iliac bypass graft [facility]) Last ultrasound we have available August 2015, graft was patent  Hyperlipidemia Recommended she start Crestor 5 mg daily given severe PAD Has been reluctant in the past  Other emphysema ([place]) Long history of smoking, reports chronic baseline shortness of breath on exertion, stable  PACEMAKER-[employer] Follow-up with Dr. Caryl Comes, appears to be functioning well    Total encounter time more than 25 minutes  Greater than 50% was spent in counseling and coordination of care with the patient   Disposition:  F/U  6 months   Orders Placed This Encounter  Procedures  . EKG 12-Lead     Signed, Esmond Plants, M.D., Ph.D. 08/17/2015  [place], [place]

## 2015-08-29 DIAGNOSIS — I10 Essential (primary) hypertension: Secondary | ICD-10-CM | POA: Diagnosis not present

## 2015-08-29 DIAGNOSIS — I701 Atherosclerosis of renal artery: Secondary | ICD-10-CM | POA: Diagnosis not present

## 2015-08-29 DIAGNOSIS — I714 Abdominal aortic aneurysm, without rupture: Secondary | ICD-10-CM | POA: Diagnosis not present

## 2015-08-29 DIAGNOSIS — I722 Aneurysm of renal artery: Secondary | ICD-10-CM | POA: Diagnosis not present

## 2015-08-31 ENCOUNTER — Ambulatory Visit (INDEPENDENT_AMBULATORY_CARE_PROVIDER_SITE_OTHER): Payer: PPO

## 2015-08-31 DIAGNOSIS — Z5181 Encounter for therapeutic drug level monitoring: Secondary | ICD-10-CM

## 2015-08-31 DIAGNOSIS — I4891 Unspecified atrial fibrillation: Secondary | ICD-10-CM

## 2015-08-31 DIAGNOSIS — Z7901 Long term (current) use of anticoagulants: Secondary | ICD-10-CM

## 2015-08-31 DIAGNOSIS — R609 Edema, unspecified: Secondary | ICD-10-CM

## 2015-08-31 DIAGNOSIS — I481 Persistent atrial fibrillation: Secondary | ICD-10-CM | POA: Diagnosis not present

## 2015-08-31 DIAGNOSIS — I4819 Other persistent atrial fibrillation: Secondary | ICD-10-CM

## 2015-08-31 LAB — POCT INR: INR: 3.4

## 2015-08-31 MED ORDER — HYDROCHLOROTHIAZIDE 25 MG PO TABS: ORAL_TABLET | ORAL | 3 refills | Status: DC

## 2015-09-28 ENCOUNTER — Ambulatory Visit (INDEPENDENT_AMBULATORY_CARE_PROVIDER_SITE_OTHER): Payer: PPO

## 2015-09-28 DIAGNOSIS — Z5181 Encounter for therapeutic drug level monitoring: Secondary | ICD-10-CM

## 2015-09-28 DIAGNOSIS — I4891 Unspecified atrial fibrillation: Secondary | ICD-10-CM

## 2015-09-28 DIAGNOSIS — Z7901 Long term (current) use of anticoagulants: Secondary | ICD-10-CM

## 2015-09-28 LAB — POCT INR: INR: 3.8

## 2015-10-12 ENCOUNTER — Ambulatory Visit (INDEPENDENT_AMBULATORY_CARE_PROVIDER_SITE_OTHER): Payer: PPO | Admitting: *Deleted

## 2015-10-12 DIAGNOSIS — Z7901 Long term (current) use of anticoagulants: Secondary | ICD-10-CM

## 2015-10-12 DIAGNOSIS — Z5181 Encounter for therapeutic drug level monitoring: Secondary | ICD-10-CM

## 2015-10-12 DIAGNOSIS — I4891 Unspecified atrial fibrillation: Secondary | ICD-10-CM

## 2015-10-12 LAB — POCT INR: INR: 3.5

## 2015-10-26 ENCOUNTER — Ambulatory Visit (INDEPENDENT_AMBULATORY_CARE_PROVIDER_SITE_OTHER): Payer: PPO

## 2015-10-26 DIAGNOSIS — Z7901 Long term (current) use of anticoagulants: Secondary | ICD-10-CM | POA: Diagnosis not present

## 2015-10-26 DIAGNOSIS — Z5181 Encounter for therapeutic drug level monitoring: Secondary | ICD-10-CM | POA: Diagnosis not present

## 2015-10-26 DIAGNOSIS — I4891 Unspecified atrial fibrillation: Secondary | ICD-10-CM

## 2015-10-26 LAB — POCT INR: INR: 2.2

## 2015-11-16 ENCOUNTER — Ambulatory Visit (INDEPENDENT_AMBULATORY_CARE_PROVIDER_SITE_OTHER): Payer: PPO

## 2015-11-16 DIAGNOSIS — I4891 Unspecified atrial fibrillation: Secondary | ICD-10-CM

## 2015-11-16 DIAGNOSIS — Z5181 Encounter for therapeutic drug level monitoring: Secondary | ICD-10-CM

## 2015-11-16 DIAGNOSIS — Z7901 Long term (current) use of anticoagulants: Secondary | ICD-10-CM | POA: Diagnosis not present

## 2015-11-16 LAB — POCT INR: INR: 2.5

## 2015-11-22 ENCOUNTER — Ambulatory Visit (INDEPENDENT_AMBULATORY_CARE_PROVIDER_SITE_OTHER): Payer: PPO | Admitting: Internal Medicine

## 2015-11-22 ENCOUNTER — Encounter: Payer: Self-pay | Admitting: Internal Medicine

## 2015-11-22 VITALS — BP 118/74 | HR 88 | Wt 140.0 lb

## 2015-11-22 DIAGNOSIS — J438 Other emphysema: Secondary | ICD-10-CM

## 2015-11-22 NOTE — Progress Notes (Signed)
[facility] Consultation      MRN# [id number] Jessica [name] November 30, 1929   CC: Chief Complaint  Patient presents with  . Follow-up    6 mth f/u; SOb w/activity; prod cough at night      Brief History: Jessica [name] first saw the [facility] in 09/2011 for GOLD grade C COPD.  She smoked until 2001.  In 2013 she had spirometry which showed ratio 53%, FEV1 0.97 L (44% predicted) and an MMRC score of 1.   Events since last clinic visit:  Patient presents today for a follow-up visit of her COPD. She states that since her last visit Advair 250/50 has been helping, she's had increased cough with thin yellow mucus production, sneezing and shortness of breath. She denies any fever chills nausea, vomiting, diarrhea. Overall she's been doing well. Admits to not using spiriva daily, only about 3 days per week.  Also stated that she is forgetting more lately, mostly names.    Current Outpatient Prescriptions:  .  albuterol (PROAIR HFA) 108 (90 BASE) MCG/ACT inhaler, Inhale 2 puffs into the lungs every 6 (six) hours as needed for wheezing or shortness of breath., Disp: 1 Inhaler, Rfl: 5 .  atenolol (TENORMIN) 25 MG tablet, Take 1 tablet (25 mg total) by mouth daily., Disp: 180 tablet, Rfl: 3 .  Fluticasone-Salmeterol (ADVAIR DISKUS) 250-50 MCG/DOSE AEPB, Inhale 1 puff into the lungs 2 (two) times daily., Disp: 180 each, Rfl: 2 .  hydrochlorothiazide (HYDRODIURIL) 25 MG tablet, Take one tablet by mouth daily as needed for swelling, Disp: 30 tablet, Rfl: 3 .  rosuvastatin (CRESTOR) 5 MG tablet, Take 1 tablet (5 mg total) by mouth daily., Disp: 30 tablet, Rfl: 11 .  tiotropium (SPIRIVA) 18 MCG inhalation capsule, Place 1 capsule (18 mcg total) into inhaler and inhale daily., Disp: 60 capsule, Rfl: 2 .  warfarin (COUMADIN) 5 MG tablet, Take as directed by anticoagulation clinic, Disp: 100 tablet, Rfl: 1   Review of Systems  Constitutional: Negative for fever.    Eyes: Negative for blurred vision.  Respiratory: Positive for cough, sputum production and shortness of breath.   Cardiovascular: Negative for chest pain.  Gastrointestinal: Negative for heartburn and nausea.  Genitourinary: Negative for dysuria.  Skin: Negative for itching and rash.  Neurological: Negative for dizziness and headaches.  Endo/Heme/Allergies: Does not bruise/bleed easily.  Psychiatric/Behavioral: Positive for memory loss.      Allergies:  Codeine; Morphine; and Zyrtec [cetirizine]  Physical Examination:  VS: BP 118/74 (BP Location: Left Arm, Cuff Size: Normal)   Pulse 88   Wt 140 lb (63.5 kg)   SpO2 96%   BMI 21.93 kg/m   General Appearance: No distress  HEENT: PERRLA, no ptosis, no other lesions noticed Pulmonary:normal breath sounds., diaphragmatic excursion normal.No wheezing, No rales   Cardiovascular:  Normal S1,S2.  No m/r/g.     Abdomen:Exam: Benign, Soft, non-tender, No masses  Skin:   warm, no rashes, no ecchymosis  Extremities: normal, no cyanosis, clubbing, warm with normal capillary refill.     Assessment and Plan: COPD (chronic obstructive pulmonary disease) COPD C No wheezing on exam today Not using spiriva regularly. Re-educated patient on using maintenance and rescue drugs/inhalers.   Plan:  - continue with Advair 250/50, 1 puff twice a day, gargle and rinse after each use -Continue Spiriva daily -albuterol, advised to use prior to exercise     Updated Medication List Outpatient Encounter Prescriptions as of 11/22/2015  Medication Sig  .  albuterol (PROAIR HFA) 108 (90 BASE) MCG/ACT inhaler Inhale 2 puffs into the lungs every 6 (six) hours as needed for wheezing or shortness of breath.  Marland Kitchen atenolol (TENORMIN) 25 MG tablet Take 1 tablet (25 mg total) by mouth daily.  . Fluticasone-Salmeterol (ADVAIR DISKUS) 250-50 MCG/DOSE AEPB Inhale 1 puff into the lungs 2 (two) times daily.  . hydrochlorothiazide (HYDRODIURIL) 25 MG tablet Take one  tablet by mouth daily as needed for swelling  . rosuvastatin (CRESTOR) 5 MG tablet Take 1 tablet (5 mg total) by mouth daily.  Marland Kitchen tiotropium (SPIRIVA) 18 MCG inhalation capsule Place 1 capsule (18 mcg total) into inhaler and inhale daily.  Marland Kitchen warfarin (COUMADIN) 5 MG tablet Take as directed by anticoagulation clinic   No facility-administered encounter medications on file as of 11/22/2015.     Orders for this visit: No orders of the defined types were placed in this encounter.   Thank  you for the visitation and for allowing  [facility] to assist in the care of your patient. Our recommendations are noted above.  Please contact us if we can be of further service.  Vilinda Boehringer, MD [facility] Office Number: [phone]  Note: This note was prepared with Dragon dictation along with smaller phrase technology. Any transcriptional errors that result from this process are unintentional.

## 2015-11-22 NOTE — Assessment & Plan Note (Signed)
COPD C No wheezing on exam today Not using spiriva regularly. Re-educated patient on using maintenance and rescue drugs/inhalers.   Plan:  - continue with Advair 250/50, 1 puff twice a day, gargle and rinse after each use -Continue Spiriva daily -albuterol, advised to use prior to exercise

## 2015-11-22 NOTE — Patient Instructions (Signed)
Follow up with Dr. Alva Garnet in 3 months (89mins visit) - cont with advair - please use your Spiriva daily at least 5 day a week, ideally 7 day a week - cont with exercise as tolerated.  - Inquire from you PMD about a neurology referral.

## 2015-12-08 ENCOUNTER — Ambulatory Visit: Payer: Self-pay | Admitting: Family Medicine

## 2015-12-14 ENCOUNTER — Ambulatory Visit (INDEPENDENT_AMBULATORY_CARE_PROVIDER_SITE_OTHER): Payer: PPO

## 2015-12-14 DIAGNOSIS — I4891 Unspecified atrial fibrillation: Secondary | ICD-10-CM | POA: Diagnosis not present

## 2015-12-14 DIAGNOSIS — Z5181 Encounter for therapeutic drug level monitoring: Secondary | ICD-10-CM | POA: Diagnosis not present

## 2015-12-14 DIAGNOSIS — Z7901 Long term (current) use of anticoagulants: Secondary | ICD-10-CM

## 2015-12-14 LAB — POCT INR: INR: 1.9

## 2015-12-30 ENCOUNTER — Ambulatory Visit (INDEPENDENT_AMBULATORY_CARE_PROVIDER_SITE_OTHER): Payer: PPO | Admitting: Family Medicine

## 2015-12-30 ENCOUNTER — Encounter: Payer: Self-pay | Admitting: Family Medicine

## 2015-12-30 VITALS — BP 134/72 | HR 78 | Temp 97.8°F | Wt 143.0 lb

## 2015-12-30 DIAGNOSIS — R413 Other amnesia: Secondary | ICD-10-CM

## 2015-12-30 DIAGNOSIS — H00014 Hordeolum externum left upper eyelid: Secondary | ICD-10-CM | POA: Diagnosis not present

## 2015-12-30 DIAGNOSIS — R430 Anosmia: Secondary | ICD-10-CM

## 2015-12-30 DIAGNOSIS — H00019 Hordeolum externum unspecified eye, unspecified eyelid: Secondary | ICD-10-CM | POA: Insufficient documentation

## 2015-12-30 NOTE — Assessment & Plan Note (Signed)
Patient with issues remembering names and some other short-term memory things. Neurologically intact. She passed the mini cog today. No issues with ADLs or IADLs. I discussed staying mentally active and continuing to monitor. She will let us know if this worsens.

## 2015-12-30 NOTE — Assessment & Plan Note (Signed)
Exam consistent with stye. Discussed warm compresses. If no improvement she'll let us know. Given return precautions.

## 2015-12-30 NOTE — Patient Instructions (Signed)
Nice to see you. You have a stye. You should use warm compresses on this area 3-4 times a day for 10-15 minutes at a time. If it does not improve over the next 1-2 weeks please let us know so you can be seen by an ophthalmologist. We will refer you to ear nose and throat to evaluate your taste and smell. Please try to stay mentally active by doing puzzles and reading.

## 2015-12-30 NOTE — Assessment & Plan Note (Signed)
Patient with decreased sensation of smell and taste. Otherwise neurologically intact at this time. Unsure of cause though has been stable over the last year. We will refer to ENT for further evaluation. Given return precautions.

## 2015-12-30 NOTE — Progress Notes (Signed)
Pre visit review using our clinic review tool, if applicable. No additional management support is needed unless otherwise documented below in the visit note. 

## 2015-12-30 NOTE — Progress Notes (Signed)
  [name], MD Phone: [phone]  Jessica [name] is a 82 y.o. female who presents today for follow-up.  Patient notes over the last year she's had issues with decreased taste and smell sensations. Notes she is able to taste sour though little else. Not much sense of smell. No congestion. No nasal issues. No medications sprayed in her nose. No numbness or weakness. No other neurological issues. Has not been evaluated for this previously.  Patient reports a stye on her left upper eyelid. Notes there is some discomfort at the area of the stye though no discomfort in her eye. No redness of her eye. No pain in her eye. No vision changes. No drainage. She has started using warm compresses today.  Patient notes memory issues that are described as forgetting names and occasionally forgetting some short-term things. No long-term memory deficits. No ADL or IADL issues. She is able to drive and does not get lost. 5 out of 5 on mini cog today.  PMH: Former smoker   ROS see history of present illness  Objective  Physical Exam Vitals:   12/30/15 1619  BP: 134/72  Pulse: 78  Temp: 97.8 F (36.6 C)    BP Readings from Last 3 Encounters:  12/30/15 134/72  11/22/15 118/74  08/17/15 (!) 144/80   Wt Readings from Last 3 Encounters:  12/30/15 143 lb (64.9 kg)  11/22/15 140 lb (63.5 kg)  08/17/15 140 lb (63.5 kg)    Physical Exam  Constitutional: She is oriented to person, place, and time. No distress.  HENT:  Head: Normocephalic and atraumatic.  Eyes: Conjunctivae are normal. Pupils are equal, round, and reactive to light.    Cardiovascular: Normal rate, regular rhythm and normal heart sounds.   Pulmonary/Chest: Effort normal.  Neurological: She is alert and oriented to person, place, and time.  CN 2-12 intact, 5/5 strength in bilateral biceps, triceps, grip, quads, hamstrings, plantar and dorsiflexion, sensation to light touch intact in bilateral UE and LE, normal gait  Skin:  Skin is warm and dry. She is not diaphoretic.     Assessment/Plan: Please see individual problem list.  Stye Exam consistent with stye. Discussed warm compresses. If no improvement she'll let us know. Given return precautions.  Loss of sense of smell Patient with decreased sensation of smell and taste. Otherwise neurologically intact at this time. Unsure of cause though has been stable over the last year. We will refer to ENT for further evaluation. Given return precautions.  Memory difficulties Patient with issues remembering names and some other short-term memory things. Neurologically intact. She passed the mini cog today. No issues with ADLs or IADLs. I discussed staying mentally active and continuing to monitor. She will let us know if this worsens.   Orders Placed This Encounter  Procedures  . Ambulatory referral to ENT    Referral Priority:   Routine    Referral Type:   Consultation    Referral Reason:   Specialty Services Required    Requested Specialty:   Otolaryngology    Number of Visits Requested:   [place], MD [place]

## 2016-01-04 ENCOUNTER — Encounter: Payer: Self-pay | Admitting: Family Medicine

## 2016-01-11 ENCOUNTER — Ambulatory Visit (INDEPENDENT_AMBULATORY_CARE_PROVIDER_SITE_OTHER): Payer: PPO

## 2016-01-11 DIAGNOSIS — Z7901 Long term (current) use of anticoagulants: Secondary | ICD-10-CM

## 2016-01-11 DIAGNOSIS — Z5181 Encounter for therapeutic drug level monitoring: Secondary | ICD-10-CM

## 2016-01-11 DIAGNOSIS — I4891 Unspecified atrial fibrillation: Secondary | ICD-10-CM | POA: Diagnosis not present

## 2016-01-11 LAB — POCT INR: INR: 1.9

## 2016-01-25 ENCOUNTER — Telehealth: Payer: Self-pay | Admitting: Pulmonary Disease

## 2016-01-25 NOTE — Telephone Encounter (Signed)
Spoke with pt. She has been scheduled with Dr. Alva Garnet on 01/26/16 at 1:45pm. Nothing further was needed.

## 2016-01-25 NOTE — Telephone Encounter (Signed)
Pt states she has been couging a lot and couging up thick yellow stuff. States she has been this way for a week. She would like to get an appt his week. Please call.

## 2016-01-26 ENCOUNTER — Ambulatory Visit (INDEPENDENT_AMBULATORY_CARE_PROVIDER_SITE_OTHER): Payer: PPO | Admitting: Pulmonary Disease

## 2016-01-26 ENCOUNTER — Encounter: Payer: Self-pay | Admitting: Pulmonary Disease

## 2016-01-26 VITALS — BP 124/88 | HR 87 | Wt 133.0 lb

## 2016-01-26 DIAGNOSIS — J449 Chronic obstructive pulmonary disease, unspecified: Secondary | ICD-10-CM | POA: Diagnosis not present

## 2016-01-26 DIAGNOSIS — J441 Chronic obstructive pulmonary disease with (acute) exacerbation: Secondary | ICD-10-CM | POA: Diagnosis not present

## 2016-01-26 MED ORDER — PREDNISONE 20 MG PO TABS: 40.0000 mg | ORAL_TABLET | Freq: Every day | ORAL | 0 refills | Status: AC

## 2016-01-26 MED ORDER — ALBUTEROL SULFATE HFA 108 (90 BASE) MCG/ACT IN AERS: 2.0000 | INHALATION_SPRAY | Freq: Four times a day (QID) | RESPIRATORY_TRACT | 5 refills | Status: DC | PRN

## 2016-01-26 MED ORDER — TIOTROPIUM BROMIDE MONOHYDRATE 18 MCG IN CAPS: 18.0000 ug | ORAL_CAPSULE | Freq: Every day | RESPIRATORY_TRACT | 10 refills | Status: DC

## 2016-01-26 MED ORDER — DOXYCYCLINE HYCLATE 100 MG PO TABS: 100.0000 mg | ORAL_TABLET | Freq: Two times a day (BID) | ORAL | 0 refills | Status: DC

## 2016-01-26 NOTE — Patient Instructions (Addendum)
1) Resume Spiriva inhaler. You may remain off of the Advair 2) Get a refill of the albuterol (Pro-air) inhaler and use that as needed for cough, wheezing or shortness of breath  3) Doxycycline (antibiotic) - twice a day for 7 days 4) Prednisone 40 mg (2 pills) daily for 5 days  5) Call if your symptoms have not returned to your normal baseline in 7 days  6) Follow up in 3-4 months with me

## 2016-01-27 NOTE — Progress Notes (Signed)
PULMONARY OFFICE FOLLOW UP NOTE  PT PROFILE: 83 y.o. F former smoker previously followed by Dr Stevenson Clinch for moderate (Gold C) COPD.  PROBLEMS:  Moderate COPD Former smoker  DATA: Spirometry 09/27/11: FVC 1.85 liters (61%), FEV1 0.97 liters (44%) CT chest 12/08/12: scarring or atelectasis in the lingula. No mass or endobronchial lesion identified. Mild to moderate emphysema  INTERVAL HISTORY: No major events  SUBJ: This is an acute visit. Pt reports 7 days of "cold" symptoms with cough productive of thick yellow mucus and increased SOB. Denies CP, fever, purulent sputum, hemoptysis, LE edema and calf tenderness. She is using Advair and Spiriva each only 3-4 times per week - alternating them. She believes that Spiriva is more beneficial. She is out of albuterol MDI  OBJ: Vitals:   01/26/16 1345  BP: 124/88  Pulse: 87  SpO2: 93%  Weight: 133 lb (60.3 kg)  RA  Gen: NAD HEENT: WNL Neck: NO LAN, no JVD noted Lungs: scattered wheezes Cardiovascular: Reg, no M noted Abdomen: Soft, NT +BS Ext: no C/C/E Neuro: CNs intact, motor/sens grossly intact   DATA:   IMPRESSION: 1) Moderate (Gold C) COPD 2) Former smoker 3) Mild acute exacerbation of COPD  PLAN: 1) Resume Spiriva inhaler - use daily. May remain off of the Advair 2) Refill albuterol (Pro-air) inhaler and use as needed for cough, wheezing or shortness of breath 3) Doxycycline - twice a day for 7 days 4) Prednisone 40 mg daily for 5 days 5) She is to call if her symptoms have not returned to baseline in 7 days 6) Follow up in 3-4 months with me   Merton Border, MD PCCM service Mobile [phone] Pager [phone] 01/27/2016

## 2016-02-15 ENCOUNTER — Ambulatory Visit (INDEPENDENT_AMBULATORY_CARE_PROVIDER_SITE_OTHER): Payer: PPO | Admitting: Cardiovascular Disease

## 2016-02-15 ENCOUNTER — Encounter: Payer: Self-pay | Admitting: Cardiovascular Disease

## 2016-02-15 ENCOUNTER — Ambulatory Visit (INDEPENDENT_AMBULATORY_CARE_PROVIDER_SITE_OTHER): Payer: PPO

## 2016-02-15 VITALS — BP 110/60 | HR 60 | Ht 67.0 in | Wt 136.5 lb

## 2016-02-15 DIAGNOSIS — I10 Essential (primary) hypertension: Secondary | ICD-10-CM

## 2016-02-15 DIAGNOSIS — I4891 Unspecified atrial fibrillation: Secondary | ICD-10-CM

## 2016-02-15 DIAGNOSIS — J441 Chronic obstructive pulmonary disease with (acute) exacerbation: Secondary | ICD-10-CM

## 2016-02-15 DIAGNOSIS — I6523 Occlusion and stenosis of bilateral carotid arteries: Secondary | ICD-10-CM

## 2016-02-15 DIAGNOSIS — E782 Mixed hyperlipidemia: Secondary | ICD-10-CM | POA: Diagnosis not present

## 2016-02-15 DIAGNOSIS — Z7901 Long term (current) use of anticoagulants: Secondary | ICD-10-CM | POA: Diagnosis not present

## 2016-02-15 DIAGNOSIS — I70309 Unspecified atherosclerosis of unspecified type of bypass graft(s) of the extremities, unspecified extremity: Secondary | ICD-10-CM

## 2016-02-15 DIAGNOSIS — Z950 Presence of cardiac pacemaker: Secondary | ICD-10-CM

## 2016-02-15 DIAGNOSIS — Z5181 Encounter for therapeutic drug level monitoring: Secondary | ICD-10-CM

## 2016-02-15 LAB — POCT INR: INR: 2.5

## 2016-02-15 NOTE — Progress Notes (Signed)
Cardiology Office Note  Date:  02/15/2016   ID:  Jessica [name], DOB Jan 11, 1930, MRN [id number]  PCP:  Jessica Rumps, MD   Chief Complaint  Patient presents with  . other    6 month f/u c/o sob. Meds reviewed verbally with pt.    HPI:  Jessica [name] is a very pleasant 83 year old woman with a history of peripheral vascular disease, status post open abdominal aortic aneurysm repair in 2009, long smoking history who stopped >15 years ago , history of complete heart block status post pacemaker implant, herna repair in 2009 with post op atrial fib, HTN, prior TIA per the notes, carotid u/s showing mild carotid disease in 2011 with atypical vessel on the left contributing to her bruit, previous pains in her neck radiating to her arms bilaterally occurring at night and awakening her from sleep, relieved by repositioning herself in bed. She presents for routine follow up of her PAD COPD, 50 years of smoking.  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 She does not do any regular exercise. She does stay active performing her own ADLs. chronic pain in her back  EKG on today's visit shows paced rhythm with rate 70 bpm  CT ABD: 1. Normally patent aorto bi-iliac bypass graft. 2.  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 There is associated significant stenosis of the renal artery just along the distal margin of the aneurysm causing visible decrease in flow to the left kidney compared to the right without infarction or significant renal atrophy. 3. Approximately 50- 70% moderate stenosis of the proximal inferior right renal artery. A small superior accessory right renal artery supplies the upper pole. 4. No significant occlusive disease of the celiac axis or superior mesenteric artery.   Other past medical history Last Myoview October 2012 showing no ischemia  PMH:   has a past medical history of AAA (abdominal aortic aneurysm) ([place]); Abdominal aneurysm without mention of rupture; Atrioventricular block, complete ([place]); Cardiac pacemaker in situ; Hernia; Hyperlipidemia; and Hypertension.  PSH:    Past Surgical History:  Procedure Laterality Date  . ABDOMINAL AORTIC ANEURYSM REPAIR    . HERNIA REPAIR    . ohter     growth on colon surgery  . TONSILLECTOMY      Current Outpatient Prescriptions  Medication Sig Dispense Refill  . albuterol (PROAIR HFA) 108 (90 Base) MCG/ACT inhaler Inhale 2 puffs into the lungs every 6 (six) hours as needed for wheezing or shortness of breath. 1 Inhaler 5  . atenolol (TENORMIN) 25 MG tablet Take 1 tablet (25 mg total) by mouth daily. 180 tablet 3  . hydrochlorothiazide (HYDRODIURIL) 25 MG tablet Take one tablet by mouth daily as needed for swelling 30 tablet 3  . rosuvastatin (CRESTOR)  5 MG tablet Take 1 tablet (5 mg total) by mouth daily. 30 tablet 11  . tiotropium (SPIRIVA) 18 MCG inhalation capsule Place 1 capsule (18 mcg total) into inhaler and inhale daily. 60 capsule 10  . warfarin (COUMADIN) 5 MG tablet Take as directed by anticoagulation clinic 100 tablet 1   No current facility-administered medications for this visit.      Allergies:   Codeine; Morphine; and Zyrtec  [cetirizine]   Social History:  The patient  reports that she quit smoking about 20 years ago. Her smoking use included Cigarettes. She has a 75.00 pack-year smoking history. She has never used smokeless tobacco. She reports that she drinks alcohol. She reports that she does not use drugs.   Family History:   family history includes Alcohol abuse in her father; Cancer in her brother; Heart disease in her father.    Review of Systems: Review of Systems  Constitutional: Negative.   Respiratory: Negative.   Cardiovascular: Negative.   Gastrointestinal: Negative.   Musculoskeletal: Negative.        Gait instability  Neurological: Negative.   Psychiatric/Behavioral: Negative.   All other systems reviewed and are negative.    PHYSICAL EXAM: VS:  BP 110/60 (BP Location: Left Arm, Patient Position: Sitting, Cuff Size: Normal)   Pulse 60   Ht 5\' 7"  (1.702 m)   Wt 136 lb 8 oz (61.9 kg)   BMI 21.38 kg/m  , BMI Body mass index is 21.38 kg/m. GEN: Well nourished, well developed, in no acute distress  HEENT: normal  Neck: no JVD, carotid bruits, or masses Cardiac: RRR; no murmurs, rubs, or gallops,no edema  Respiratory:  clear to auscultation bilaterally, normal work of breathing GI: soft, nontender, nondistended, + BS MS: no deformity or atrophy  Skin: warm and dry, no rash, large regions of ecchymoses on her legs Neuro:  Strength and sensation are intact Psych: euthymic mood, full affect    Recent Labs: 08/12/2015: ALT 13; BUN 19; Creatinine, Ser 1.36; Hemoglobin 14.1; Platelets 131; Potassium 4.0; Sodium 136    Lipid Panel Lab Results  Component Value Date   CHOL 148 01/30/2013   HDL 46.30 01/30/2013   LDLCALC 78 01/30/2013   TRIG 117.0 01/30/2013      Wt Readings from Last 3 Encounters:  02/15/16 136 lb 8 oz (61.9 kg)  01/26/16 133 lb (60.3 kg)  12/30/15 143 lb (64.9 kg)       ASSESSMENT AND PLAN:  Atrial fibrillation, unspecified type ([place]) - Plan: EKG  12-Lead Chronic atrial fibrillation, tolerating warfarin, no recent falls  HYPERTENSION, BENIGN Blood pressure is well controlled on today's visit. No changes made to the medications.  Bilateral carotid artery stenosis Mild disease in the past  Mixed hyperlipidemia Previous cholesterol within goal, no recent lipid panel available Recommend she have this done on routine annual visit to primary care  Atherosclerosis of aorto-iliac bypass graft ([place]) Followed by vascular surgery Has ultrasound scheduled next week  COPD exacerbation ([place]) Recently seen by pulmonary, recent COPD exacerbation, improved symptoms on prednisone and antibiotics. Feel she is back to her baseline  Long term current use of anticoagulant Long discussion concerning her warfarin, tolerating this well, no recent falls She is willing to stay on warfarin despite excessive bruising  PACEMAKER-[employer] Followed by Dr. Caryl Comes   Total encounter time more than 25 minutes  Greater than 50% was spent in counseling and coordination of care with the patient   Disposition:   F/U  6 months  Orders Placed This Encounter  Procedures  . EKG 12-Lead     Signed, Esmond Plants, M.D., Ph.D. 02/15/2016  [place], [place]

## 2016-02-15 NOTE — Patient Instructions (Signed)

## 2016-02-29 ENCOUNTER — Ambulatory Visit (INDEPENDENT_AMBULATORY_CARE_PROVIDER_SITE_OTHER): Payer: Self-pay | Admitting: Vascular Surgery

## 2016-02-29 ENCOUNTER — Encounter (INDEPENDENT_AMBULATORY_CARE_PROVIDER_SITE_OTHER): Payer: Self-pay

## 2016-03-05 ENCOUNTER — Ambulatory Visit (INDEPENDENT_AMBULATORY_CARE_PROVIDER_SITE_OTHER): Payer: PPO

## 2016-03-05 VITALS — BP 110/60 | HR 72 | Temp 97.7°F | Resp 14 | Ht 65.0 in | Wt 136.8 lb

## 2016-03-05 DIAGNOSIS — Z0000 Encounter for general adult medical examination without abnormal findings: Secondary | ICD-10-CM

## 2016-03-05 NOTE — Progress Notes (Signed)
Subjective:   Jessica [name] is a 83 y.o. female who presents for Medicare Annual (Subsequent) preventive examination.  Review of Systems:  No ROS.  Medicare Wellness Visit.  Cardiac Risk Factors include: advanced age (>70men, >6 women);hypertension     Objective:     Vitals: BP 110/60 (BP Location: Right Arm, Patient Position: Sitting, Cuff Size: Normal)   Pulse 72   Temp 97.7 F (36.5 C) (Oral)   Resp 14   Ht 5\' 5"  (1.651 m)   Wt 136 lb 12.8 oz (62.1 kg)   SpO2 95%   BMI 22.76 kg/m   Body mass index is 22.76 kg/m.   Tobacco History  Smoking Status  . Former Smoker  . Packs/day: 1.50  . Years: 50.00  . Types: Cigarettes  . Quit date: 10/03/1995  Smokeless Tobacco  . Never Used     Counseling given: Not Answered   Past Medical History:  Diagnosis Date  . AAA (abdominal aortic aneurysm) ([place])   . Abdominal aneurysm without mention of rupture   . Atrioventricular block, complete ([place])   . Cardiac pacemaker in situ   . Hernia   . Hyperlipidemia   . Hypertension    Past Surgical History:  Procedure Laterality Date  . ABDOMINAL AORTIC ANEURYSM REPAIR    . HERNIA REPAIR    . ohter     growth on colon surgery  . TONSILLECTOMY     Family History  Problem Relation Age of Onset  . Heart disease Father   . Alcohol abuse Father   . Cancer Brother    History  Sexual Activity  . Sexual activity: No    Outpatient Encounter Prescriptions as of 03/05/2016  Medication Sig  . albuterol (PROAIR HFA) 108 (90 Base) MCG/ACT inhaler Inhale 2 puffs into the lungs every 6 (six) hours as needed for wheezing or shortness of breath.  Marland Kitchen atenolol (TENORMIN) 25 MG tablet Take 1 tablet (25 mg total) by mouth daily.  . hydrochlorothiazide (HYDRODIURIL) 25 MG tablet Take one tablet by mouth daily as needed for swelling  . rosuvastatin (CRESTOR) 5 MG tablet Take 1 tablet (5 mg total) by mouth daily.  Marland Kitchen tiotropium (SPIRIVA) 18 MCG inhalation capsule Place 1 capsule (18 mcg  total) into inhaler and inhale daily.  Marland Kitchen warfarin (COUMADIN) 5 MG tablet Take as directed by anticoagulation clinic   No facility-administered encounter medications on file as of 03/05/2016.     Activities of Daily Living In your present state of health, do you have any difficulty performing the following activities: 03/05/2016  Hearing? N  Vision? N  Difficulty concentrating or making decisions? Y  Walking or climbing stairs? Y  Dressing or bathing? N  Doing errands, shopping? N  Preparing Food and eating ? N  Using the Toilet? N  In the past six months, have you accidently leaked urine? N  Do you have problems with loss of bowel control? N  Managing your Medications? N  Managing your Finances? N  Housekeeping or managing your Housekeeping? N  Some recent data might be hidden    Patient Care Team: Jessica Haven, MD as PCP - General (Family Medicine) Jessica Sprang, MD (Cardiology) Jessica Merritts, MD (Cardiology)    Assessment:    This is a routine wellness examination for Jessica [name]. The goal of the wellness visit is to assist the patient how to close the gaps in care and create a preventative care plan for the patient.   Osteoporosis risk  reviewed. DEXA Scan declined per patient request.    Medications reviewed; taking without issues or barriers.  Safety issues reviewed; lives alone. Smoke detectors in the home. No firearms in the home. Wears seatbelts when driving or riding with others. No violence in the home.  No identified risk were noted; The patient was oriented x 3; appropriate in dress and manner and no objective failures at ADL's or IADL's.   BMI; discussed the importance of a healthy diet, water intake and exercise. Educational material provided.  HTN; followed by PCP.  TDAP and ZOSTAVAX vaccine deferred at this time. Educational material provided.   Patient Concerns: None at this time.  Follow up with PCP as needed.  Exercise Activities and  Dietary recommendations Current Exercise Habits: The patient does not participate in regular exercise at present  Goals    . Increase physical activity          Walk for 3 days a week, 30-45 minutes.     . Increase water intake          Stay hydrated. Substitute pepsi and tea for water.      Fall Risk Fall Risk  03/05/2016 03/04/2015 04/22/2013  Falls in the past year? No Yes Yes  Number falls in past yr: - 1 1  Injury with Fall? - Yes No  Follow up - Education provided;Falls prevention discussed -   Depression Screen PHQ 2/9 Scores 03/05/2016 03/04/2015 04/22/2013 01/30/2013  PHQ - 2 Score 0 0 0 3  PHQ- 9 Score - - - 6     Cognitive Function MMSE - Mini Mental State Exam 03/05/2016 03/04/2015  Orientation to time 5 5  Orientation to Place 5 5  Registration 3 3  Attention/ Calculation 5 5  Recall 3 3  Language- name 2 objects 2 2  Language- repeat 1 1  Language- follow 3 step command 3 3  Language- read & follow direction 1 1  Write a sentence 1 1  Copy design 1 1  Total score 30 30        Immunization History  Administered Date(s) Administered  . Influenza Split 12/13/2010, 09/27/2011, 12/13/2013  . Influenza Whole 11/28/2009  . Influenza, High Dose Seasonal PF 11/26/2012  . Influenza, Seasonal, Injecte, Preservative Fre 11/23/2014  . Influenza-Unspecified 09/27/2015  . Pneumococcal Conjugate-13 04/22/2013  . Pneumococcal Polysaccharide-23 08/27/2006   Screening Tests Health Maintenance  Topic Date Due  . TETANUS/TDAP  05/05/1950  . ZOSTAVAX  05/05/1991  . DEXA SCAN  05/04/1996  . INFLUENZA VACCINE  Completed  . PNA vac Low Risk Adult  Completed      Plan:    End of life planning; Advance aging; Advanced directives discussed. Copy of current HCPOA/Living Will, DNR on file.  DNR completed in office today.  Patient is one hundred percent independent and of sound mind.  No new risks noted. Time spent discussing this topic is 30 minutes.  Medicare Attestation I  have personally reviewed: The patient's medical and social history Their use of alcohol, tobacco or illicit drugs Their current medications and supplements The patient's functional ability including ADLs,fall risks, home safety risks, cognitive, and hearing and visual impairment Diet and physical activities Evidence for depression   The patient's weight, height, BMI, and visual acuity have been recorded in the chart.  I have made referrals and provided education to the patient based on review of the above and I have provided the patient with a written personalized care plan for preventive services.  During the course of the visit the patient was educated and counseled about the following appropriate screening and preventive services:   Vaccines to include Pneumoccal, Influenza, Hepatitis B, Td, Zostavax, HCV  Electrocardiogram  Cardiovascular Disease  Colorectal cancer screening  Bone density screening  Diabetes screening  Glaucoma screening  Mammography/PAP  Nutrition counseling   Patient Instructions (the written plan) was given to the patient.   Varney Biles, LPN  1/65/5374

## 2016-03-05 NOTE — Patient Instructions (Addendum)
  Jessica [name] , Thank you for taking time to come for your Medicare Wellness Visit. I appreciate your ongoing commitment to your health goals. Please review the following plan we discussed and let me know if I can assist you in the future.   Follow up with Dr. Caryl Bis as needed.  DNR form completed and scanned into chart.    These are the goals we discussed: Goals    . Increase physical activity          Walk for 3 days a week, 30-45 minutes.     . Increase water intake          Stay hydrated. Substitute pepsi and tea for water.       This is a list of the screening recommended for you and due dates:  Health Maintenance  Topic Date Due  . Tetanus Vaccine  05[birth year]05/1950  . Shingles Vaccine  05/05/1991  . DEXA scan (bone density measurement)  05/04/1996  . Flu Shot  Completed  . Pneumonia vaccines  Completed

## 2016-03-07 NOTE — Progress Notes (Signed)
I reviewed the above note and agree.  [name], M.D.

## 2016-03-13 ENCOUNTER — Other Ambulatory Visit: Payer: Self-pay | Admitting: Cardiovascular Disease

## 2016-03-13 DIAGNOSIS — R609 Edema, unspecified: Secondary | ICD-10-CM

## 2016-03-13 MED ORDER — HYDROCHLOROTHIAZIDE 25 MG PO TABS: ORAL_TABLET | ORAL | 3 refills | Status: DC

## 2016-03-14 ENCOUNTER — Ambulatory Visit (INDEPENDENT_AMBULATORY_CARE_PROVIDER_SITE_OTHER): Payer: PPO

## 2016-03-14 ENCOUNTER — Other Ambulatory Visit: Payer: Self-pay

## 2016-03-14 DIAGNOSIS — Z5181 Encounter for therapeutic drug level monitoring: Secondary | ICD-10-CM

## 2016-03-14 DIAGNOSIS — R609 Edema, unspecified: Secondary | ICD-10-CM

## 2016-03-14 DIAGNOSIS — I4891 Unspecified atrial fibrillation: Secondary | ICD-10-CM | POA: Diagnosis not present

## 2016-03-14 LAB — POCT INR: INR: 2.6

## 2016-03-14 MED ORDER — WARFARIN SODIUM 5 MG PO TABS: ORAL_TABLET | ORAL | 1 refills | Status: DC

## 2016-03-14 NOTE — Telephone Encounter (Signed)
Refill was sent in yesterday, 03/13/16.

## 2016-03-20 ENCOUNTER — Ambulatory Visit (INDEPENDENT_AMBULATORY_CARE_PROVIDER_SITE_OTHER): Payer: PPO | Admitting: *Deleted

## 2016-03-20 DIAGNOSIS — I442 Atrioventricular block, complete: Secondary | ICD-10-CM | POA: Diagnosis not present

## 2016-03-20 DIAGNOSIS — Z950 Presence of cardiac pacemaker: Secondary | ICD-10-CM

## 2016-03-20 DIAGNOSIS — I4891 Unspecified atrial fibrillation: Secondary | ICD-10-CM | POA: Diagnosis not present

## 2016-03-20 LAB — CUP PACEART INCLINIC DEVICE CHECK
Battery Impedance: 6500 Ohm
Battery Voltage: 2.72 V
Brady Statistic RV Percent Paced: 99 %
Date Time Interrogation Session: 20180227160354
Implantable Lead Implant Date: 20090610
Implantable Lead Implant Date: 20090610
Implantable Lead Location: 753859
Implantable Lead Location: 753860
Implantable Lead Model: 5076
Implantable Pulse Generator Implant Date: 20090610
Lead Channel Impedance Value: 409 Ohm
Lead Channel Impedance Value: 491 Ohm
Lead Channel Pacing Threshold Amplitude: 0.75 V
Lead Channel Pacing Threshold Amplitude: 0.75 V
Lead Channel Pacing Threshold Pulse Width: 0.4 ms
Lead Channel Pacing Threshold Pulse Width: 0.5 ms
Lead Channel Sensing Intrinsic Amplitude: 1 mV
Lead Channel Sensing Intrinsic Amplitude: 4.3 mV
Lead Channel Setting Pacing Amplitude: 2 V
Lead Channel Setting Pacing Pulse Width: 0.5 ms
Lead Channel Setting Sensing Sensitivity: 2 mV
Pulse Gen Model: 5826
Pulse Gen Serial Number: 1236137

## 2016-03-20 NOTE — Progress Notes (Signed)
Pacemaker check in clinic. Normal device function. Thresholds, sensing, impedances consistent with previous measurements. Patient presented Vp w/rare P-waves, reprogrammed mode to DDIR from VVIR (previously permanent AF), base rate maintained at 70bpm, all other programming per previous chronic settings. Pt then transitioned to AT, then AF, +warfarin. No episode triggers enabled. Device programmed at appropriate safety margins. Histogram distribution appropriate for patient activity level. Estimated longevity 0.75-1.25 years. Patient education completed. ROV with [employer] in 3 months and with [facility] in 6 months.

## 2016-03-29 ENCOUNTER — Ambulatory Visit (INDEPENDENT_AMBULATORY_CARE_PROVIDER_SITE_OTHER): Payer: PPO | Admitting: Family Medicine

## 2016-03-29 ENCOUNTER — Encounter: Payer: Self-pay | Admitting: Family Medicine

## 2016-03-29 DIAGNOSIS — J438 Other emphysema: Secondary | ICD-10-CM | POA: Diagnosis not present

## 2016-03-29 DIAGNOSIS — I4891 Unspecified atrial fibrillation: Secondary | ICD-10-CM | POA: Diagnosis not present

## 2016-03-29 DIAGNOSIS — I10 Essential (primary) hypertension: Secondary | ICD-10-CM | POA: Diagnosis not present

## 2016-03-29 DIAGNOSIS — R430 Anosmia: Secondary | ICD-10-CM | POA: Diagnosis not present

## 2016-03-29 NOTE — Progress Notes (Signed)
Pre visit review using our clinic review tool, if applicable. No additional management support is needed unless otherwise documented below in the visit note. 

## 2016-03-29 NOTE — Assessment & Plan Note (Signed)
Patient does not want to pursue any further evaluation of this. She'll continue to monitor and if there are any changes or worsening she'll let us know.

## 2016-03-29 NOTE — Patient Instructions (Signed)
Nice to see you. You're doing very well. Please monitor your smell and taste issues and if they change or worsen please let us know. Please monitor your cough and this should continue to improve following your treatment for your COPD exacerbation. Please continue to follow with cardiology for your warfarin.

## 2016-03-29 NOTE — Assessment & Plan Note (Signed)
Currently stable. Suspect slight residual cough from her prior exacerbation. Encouraged use of Spiriva. Albuterol as needed. Monitor for any worsening symptoms.

## 2016-03-29 NOTE — Progress Notes (Signed)
  Jessica Rumps, MD Phone: [phone]  Jessica [name] is a 83 y.o. female who presents today for f/u.  Hypertension: Not checking at home. Taking atenolol. No chest pain. No edema. Notes some shortness of breath that is stable from her COPD.  COPD: Notes some mild cough that is residual from a COPD exacerbation in January. Notes her breathing is stable. No wheezing. Cough is minimally productive if at all. Uses Spiriva 3 times a week.  A. fib: Follows with cardiology for this. On atenolol. No palpitations. Has a pacemaker. No bleeding issues with her warfarin. Most recent INR was 2.6.  Patient reported smell and taste issues previously. She was referred to ENT though did not keep this appointment. She does not want to see them. She notes still has issues smelling. She can taste sour and sweet stuff. Notes no numbness, weakness, or vision changes.   PMH: Former smoker   ROS see history of present illness  Objective  Physical Exam Vitals:   03/29/16 1049  BP: 140/82  Pulse: 71  Temp: 97.5 F (36.4 C)    BP Readings from Last 3 Encounters:  03/29/16 140/82  03/05/16 110/60  02/15/16 110/60   Wt Readings from Last 3 Encounters:  03/29/16 138 lb (62.6 kg)  03/05/16 136 lb 12.8 oz (62.1 kg)  02/15/16 136 lb 8 oz (61.9 kg)    Physical Exam  Constitutional: No distress.  Cardiovascular: Normal rate, regular rhythm and normal heart sounds.   Pulmonary/Chest: Effort normal and breath sounds normal.  Musculoskeletal: She exhibits no edema.  Neurological: She is alert. Gait normal.  Skin: Skin is warm and dry. She is not diaphoretic.     Assessment/Plan: Please see individual problem list.  ATRIAL FIBRILLATION On warfarin. No symptoms. She'll continue to follow with cardiology and [place].  HYPERTENSION, BENIGN Well-controlled for age. Continue atenolol.  COPD (chronic obstructive pulmonary disease) Currently stable. Suspect slight residual cough from her prior  exacerbation. Encouraged use of Spiriva. Albuterol as needed. Monitor for any worsening symptoms.  Loss of sense of smell Patient does not want to pursue any further evaluation of this. She'll continue to monitor and if there are any changes or worsening she'll let us know.  Jessica Rumps, MD [place]

## 2016-03-29 NOTE — Assessment & Plan Note (Signed)
On warfarin. No symptoms. She'll continue to follow with cardiology and [place].

## 2016-03-29 NOTE — Assessment & Plan Note (Signed)
Well-controlled for age. Continue atenolol.

## 2016-04-13 ENCOUNTER — Other Ambulatory Visit (INDEPENDENT_AMBULATORY_CARE_PROVIDER_SITE_OTHER): Payer: Self-pay | Admitting: Vascular Surgery

## 2016-04-13 DIAGNOSIS — I701 Atherosclerosis of renal artery: Secondary | ICD-10-CM

## 2016-04-18 ENCOUNTER — Ambulatory Visit (INDEPENDENT_AMBULATORY_CARE_PROVIDER_SITE_OTHER): Payer: PPO

## 2016-04-18 ENCOUNTER — Encounter (INDEPENDENT_AMBULATORY_CARE_PROVIDER_SITE_OTHER): Payer: Self-pay | Admitting: Vascular Surgery

## 2016-04-18 ENCOUNTER — Ambulatory Visit (INDEPENDENT_AMBULATORY_CARE_PROVIDER_SITE_OTHER): Payer: PPO | Admitting: Vascular Surgery

## 2016-04-18 VITALS — BP 121/78 | HR 70 | Resp 16 | Ht 65.0 in | Wt 137.0 lb

## 2016-04-18 DIAGNOSIS — I714 Abdominal aortic aneurysm, without rupture, unspecified: Secondary | ICD-10-CM | POA: Insufficient documentation

## 2016-04-18 DIAGNOSIS — I701 Atherosclerosis of renal artery: Secondary | ICD-10-CM

## 2016-04-18 DIAGNOSIS — E782 Mixed hyperlipidemia: Secondary | ICD-10-CM

## 2016-04-18 NOTE — Progress Notes (Signed)
Subjective:    Patient ID: Jessica [name], female    DOB: 04-Aug-1929, 83 y.o.   MRN: [id number] Chief Complaint  Patient presents with  . Re-evaluation    6 month renal ultrasound   The patient presents for a six month renal artery stenosis follow up. The patient underwent a renal artery duplex exam which was notable minimal change when compared to a CT exam on 08/21/16, the patient has >60% the bilateral kidneys. The patient reports good HTN control and no issues with their kidney function. His BP today was 121/78.    Review of Systems  Constitutional: Negative.   HENT: Negative.   Eyes: Negative.   Respiratory: Negative.   Cardiovascular: Negative.   Gastrointestinal: Negative.   Endocrine: Negative.   Genitourinary: Negative.   Musculoskeletal: Negative.   Skin: Negative.   Allergic/Immunologic: Negative.   Neurological: Negative.   Hematological: Negative.   Psychiatric/Behavioral: Negative.       Objective:   Physical Exam  Constitutional: She is oriented to person, place, and time. She appears well-developed and well-nourished. No distress.  HENT:  Head: Normocephalic and atraumatic.  Eyes: Conjunctivae are normal. Pupils are equal, round, and reactive to light.  Neck: Normal range of motion.  Cardiovascular: Normal rate, regular rhythm and normal heart sounds.   Pulses:      Radial pulses are 2+ on the right side, and 2+ on the left side.       Dorsalis pedis pulses are 2+ on the right side, and 2+ on the left side.       Posterior tibial pulses are 2+ on the right side, and 2+ on the left side.  Pulmonary/Chest: Effort normal.  Musculoskeletal: Normal range of motion. She exhibits no edema.  Neurological: She is alert and oriented to person, place, and time.  Skin: Skin is warm and dry. She is not diaphoretic.  Psychiatric: She has a normal mood and affect. Her behavior is normal. Judgment and thought content normal.   BP 121/78 (BP Location: Left Arm)   Pulse  70   Resp 16   Ht 5\' 5"  (1.651 m)   Wt 62.1 kg (137 lb)   BMI 22.80 kg/m   Past Medical History:  Diagnosis Date  . AAA (abdominal aortic aneurysm) ([place])   . Abdominal aneurysm without mention of rupture   . Atrioventricular block, complete ([place])   . Cardiac pacemaker in situ   . Hernia   . Hyperlipidemia   . Hypertension    Social History   Social History  . Marital status: Widowed    Spouse name: N/A  . Number of children: 2  . Years of education: N/A   Occupational History  . Retired Retired   Social History Main Topics  . Smoking status: Former Smoker    Packs/day: 1.50    Years: 50.00    Types: Cigarettes    Quit date: 10/03/1995  . Smokeless tobacco: Never Used  . Alcohol use 0.6 oz/week    1 Glasses of wine per week     Comment: per week  . Drug use: No  . Sexual activity: No   Other Topics Concern  . Not on file   Social History Narrative   Born in [place]. Lives in [place]. Son lives nearby.      No regular exercise    Past Surgical History:  Procedure Laterality Date  . ABDOMINAL AORTIC ANEURYSM REPAIR    . HERNIA REPAIR    . ohter  growth on colon surgery  . TONSILLECTOMY     Family History  Problem Relation Age of Onset  . Heart disease Father   . Alcohol abuse Father   . Cancer Brother    Allergies  Allergen Reactions  . Codeine     Made her feel crazy  . Morphine     Made her feel crazy  . Zyrtec [Cetirizine]     Causes facial numbness      Assessment & Plan:  The patient presents for a six month renal artery stenosis follow up. The patient underwent a renal artery duplex exam which was notable minimal change when compared to a CT exam on 08/21/16, the patient has >60% the bilateral kidneys. The patient reports good HTN control and no issues with their kidney function. His BP today was 121/78.   1. AAA (abdominal aortic aneurysm) without rupture [facility]) - New Patient is s/p AAA repair with Dr. Kellie Simmering. Patient does not want to  drive to [facility] and would like to esbalish care here in our office. Will order a AAA duplex for a baseline.  - VAS [place] AAA DUPLEX; Future  2. Renal artery stenosis (HCC) - Stable Stable duplex. HTN controlled. Patient to follow up in six for close monitoring. Patient to continue medical optimization with ASA, antihypertensives and dyslipidemia medication. Patient to remain abstinent of tobacco use. I have discussed with the patient at length the risk factors for and pathogenesis of atherosclerotic disease and encouraged a healthy diet, regular exercise regimen and blood pressure / glucose control.  Patient was instructed to contact our office in the interim with problems such as increasing / uncontrollable hypertension or changes in kidney function. The patient expresses their understanding.  - VAS [place] RENAL ARTERY DUPLEX; Future  3. Mixed hyperlipidemia - Stable Encouraged good control as its slows the progression of atherosclerotic disease  Current Outpatient Prescriptions on File Prior to Visit  Medication Sig Dispense Refill  . albuterol (PROAIR HFA) 108 (90 Base) MCG/ACT inhaler Inhale 2 puffs into the lungs every 6 (six) hours as needed for wheezing or shortness of breath. 1 Inhaler 5  . atenolol (TENORMIN) 25 MG tablet Take 1 tablet (25 mg total) by mouth daily. 180 tablet 3  . hydrochlorothiazide (HYDRODIURIL) 25 MG tablet Take one tablet by mouth daily as needed for swelling 30 tablet 3  . rosuvastatin (CRESTOR) 5 MG tablet Take 1 tablet (5 mg total) by mouth daily. 30 tablet 11  . tiotropium (SPIRIVA) 18 MCG inhalation capsule Place 1 capsule (18 mcg total) into inhaler and inhale daily. 60 capsule 10  . warfarin (COUMADIN) 5 MG tablet Take as directed by anticoagulation clinic 100 tablet 1   No current facility-administered medications on file prior to visit.     There are no Patient Instructions on file for this visit. No Follow-up on file.   Buna Cuppett A Jden Want,  PA-C

## 2016-04-25 ENCOUNTER — Ambulatory Visit (INDEPENDENT_AMBULATORY_CARE_PROVIDER_SITE_OTHER): Payer: PPO

## 2016-04-25 DIAGNOSIS — I4891 Unspecified atrial fibrillation: Secondary | ICD-10-CM | POA: Diagnosis not present

## 2016-04-25 DIAGNOSIS — Z5181 Encounter for therapeutic drug level monitoring: Secondary | ICD-10-CM

## 2016-04-25 LAB — POCT INR: INR: 1.7

## 2016-05-01 ENCOUNTER — Other Ambulatory Visit (INDEPENDENT_AMBULATORY_CARE_PROVIDER_SITE_OTHER): Payer: Self-pay

## 2016-05-01 ENCOUNTER — Ambulatory Visit (INDEPENDENT_AMBULATORY_CARE_PROVIDER_SITE_OTHER): Payer: Self-pay | Admitting: Vascular Surgery

## 2016-05-16 ENCOUNTER — Ambulatory Visit (INDEPENDENT_AMBULATORY_CARE_PROVIDER_SITE_OTHER): Payer: PPO

## 2016-05-16 DIAGNOSIS — Z5181 Encounter for therapeutic drug level monitoring: Secondary | ICD-10-CM

## 2016-05-16 DIAGNOSIS — I4891 Unspecified atrial fibrillation: Secondary | ICD-10-CM | POA: Diagnosis not present

## 2016-05-16 LAB — POCT INR: INR: 1.9

## 2016-05-30 ENCOUNTER — Other Ambulatory Visit: Payer: Self-pay | Admitting: Cardiovascular Disease

## 2016-05-30 DIAGNOSIS — I442 Atrioventricular block, complete: Secondary | ICD-10-CM

## 2016-05-30 DIAGNOSIS — I10 Essential (primary) hypertension: Secondary | ICD-10-CM

## 2016-05-30 DIAGNOSIS — I4891 Unspecified atrial fibrillation: Secondary | ICD-10-CM

## 2016-06-05 ENCOUNTER — Ambulatory Visit (INDEPENDENT_AMBULATORY_CARE_PROVIDER_SITE_OTHER): Payer: PPO | Admitting: Family Medicine

## 2016-06-05 ENCOUNTER — Encounter: Payer: Self-pay | Admitting: Family Medicine

## 2016-06-05 VITALS — BP 144/78 | HR 63 | Temp 97.9°F | Wt 138.4 lb

## 2016-06-05 DIAGNOSIS — M79671 Pain in right foot: Secondary | ICD-10-CM

## 2016-06-05 DIAGNOSIS — M109 Gout, unspecified: Secondary | ICD-10-CM | POA: Diagnosis not present

## 2016-06-05 LAB — CBC
HCT: 42.1 % (ref 36.0–46.0)
Hemoglobin: 14.4 g/dL (ref 12.0–15.0)
MCHC: 34.1 g/dL (ref 30.0–36.0)
MCV: 94.3 fl (ref 78.0–100.0)
Platelets: 158 10*3/uL (ref 150.0–400.0)
RBC: 4.47 Mil/uL (ref 3.87–5.11)
RDW: 15.4 % (ref 11.5–15.5)
WBC: 7.3 10*3/uL (ref 4.0–10.5)

## 2016-06-05 LAB — URIC ACID: Uric Acid, Serum: 10.5 mg/dL — ABNORMAL HIGH (ref 2.4–7.0)

## 2016-06-05 MED ORDER — PREDNISONE 20 MG PO TABS: ORAL_TABLET | ORAL | 0 refills | Status: DC

## 2016-06-05 NOTE — Assessment & Plan Note (Signed)
History and exam findings consistent with podagra. No prior history of gout. We will check a uric acid. We will check a CBC to evaluate a white blood cell count. If elevated may need to consider infection as a possible cause though exam findings and history are consistent with podagra. We will start on a prednisone taper. We will recheck in several days. She is given return precautions.

## 2016-06-05 NOTE — Patient Instructions (Signed)
Nice to see you. I suspect her discomfort is related to gout. We will check lab work to evaluate for this and for markers of infection. If it appears that you have infection on the lab work we will start her on an antibiotic as well. If he develops spreading redness, fevers, chills, sweats, increased pain, or any new or change in symptoms please seek medical attention immediately.

## 2016-06-05 NOTE — Progress Notes (Signed)
  [name], MD Phone: [phone]  Jessica [name] is a 83 y.o. female who presents today for same-day visit.  Patient notes onset of symptoms yesterday. Notes it started at her right first MTP joint. Started with pain and erythema over this area. No injury. No fevers. She does not feel ill. She's not had any chills or sweats. Notes the redness has spread to some degree over the dorsum of her second and third MTP joints and slightly proximally up her arch. She notes it hurt so much previously that she could barely touch it. Pain is slightly improved from prior. She notes no history of gout.  PMH: Former smoker   ROS see history of present illness  Objective  Physical Exam Vitals:   06/05/16 1410  BP: (!) 144/78  Pulse: 63  Temp: 97.9 F (36.6 C)    BP Readings from Last 3 Encounters:  06/05/16 (!) 144/78  04/18/16 121/78  03/29/16 140/82   Wt Readings from Last 3 Encounters:  06/05/16 138 lb 6 oz (62.8 kg)  04/18/16 137 lb (62.1 kg)  03/29/16 138 lb (62.6 kg)    Physical Exam  Constitutional: No distress.  Cardiovascular:  2+ DP and PT pulses bilaterally, good capillary refill bilateral feet  Musculoskeletal: She exhibits no edema.  Right first MTP joint with overlying erythema, minimal swelling, there is no induration, there is tenderness over the MTP joint, minimal erythema proximally along the arch and just over the second and third MTP joints as well, these areas are not tender, sensation to light touch intact bilateral feet, left foot with no swelling, warmth, erythema, or tenderness  Neurological: She is alert.  Skin: Skin is warm and dry. She is not diaphoretic.     Assessment/Plan: Please see individual problem list.  Podagra History and exam findings consistent with podagra. No prior history of gout. We will check a uric acid. We will check a CBC to evaluate a white blood cell count. If elevated may need to consider infection as a possible cause though  exam findings and history are consistent with podagra. We will start on a prednisone taper. We will recheck in several days. She is given return precautions.   Orders Placed This Encounter  Procedures  . CBC  . Uric acid    Meds ordered this encounter  Medications  . predniSONE (DELTASONE) 20 MG tablet    Sig: Take 40 mg (2 tablets) by mouth daily for 4 days, then take 20 mg (one tablet) by mouth daily for 4 days, then take 10 mg (half a tablet) by mouth daily for 4 days    Dispense:  14 tablet    Refill:  0   [name], MD [place]

## 2016-06-06 ENCOUNTER — Other Ambulatory Visit: Payer: Self-pay | Admitting: Family Medicine

## 2016-06-06 DIAGNOSIS — M109 Gout, unspecified: Secondary | ICD-10-CM

## 2016-06-08 ENCOUNTER — Encounter: Payer: Self-pay | Admitting: Family Medicine

## 2016-06-08 ENCOUNTER — Ambulatory Visit (INDEPENDENT_AMBULATORY_CARE_PROVIDER_SITE_OTHER): Payer: PPO | Admitting: Family Medicine

## 2016-06-08 VITALS — BP 120/78 | HR 70 | Temp 97.9°F | Wt 139.6 lb

## 2016-06-08 DIAGNOSIS — Z13820 Encounter for screening for osteoporosis: Secondary | ICD-10-CM

## 2016-06-08 DIAGNOSIS — M109 Gout, unspecified: Secondary | ICD-10-CM | POA: Diagnosis not present

## 2016-06-08 DIAGNOSIS — I10 Essential (primary) hypertension: Secondary | ICD-10-CM | POA: Diagnosis not present

## 2016-06-08 NOTE — Progress Notes (Signed)
  [name], MD Phone: [phone]  Jessica [name] is a 83 y.o. female who presents today for follow-up.  Gout: Patient recently seen for a gout flare. Started on prednisone. Notes her right toe is less red and less painful. No prior history of gout. She does take HCTZ. She does not check her blood pressure very frequently at home. Currently takes 25 mg of HCTZ. It is written to take as needed though she reports she takes every day. No swelling currently.  ROS see history of present illness  Objective  Physical Exam Vitals:   06/08/16 0940  BP: 120/78  Pulse: 70  Temp: 97.9 F (36.6 C)    BP Readings from Last 3 Encounters:  06/08/16 120/78  06/05/16 (!) 144/78  04/18/16 121/78   Wt Readings from Last 3 Encounters:  06/08/16 139 lb 9.6 oz (63.3 kg)  06/05/16 138 lb 6 oz (62.8 kg)  04/18/16 137 lb (62.1 kg)    Physical Exam  Constitutional: No distress.  Cardiovascular: Normal rate, regular rhythm and normal heart sounds.   Pulmonary/Chest: Effort normal and breath sounds normal.  Musculoskeletal:  Much improved erythema and swelling over the right first MTP joint, significant improvement tenderness, no induration or warmth  Neurological: She is alert.  Skin: Skin is warm and dry. She is not diaphoretic.     Assessment/Plan: Please see individual problem list.  Podagra Much improved. She'll finish her course of prednisone. We'll have her discontinue her hydrochlorothiazide given that this may worsen gout. She'll return in several weeks to have a uric acid checked to determine if she needs prophylactic treatment down the line.   HYPERTENSION, BENIGN Patient will continue to monitor BP at home now that we are taking her off of HCTZ. I did discuss adding an additional agent though she opted to monitor at home and if needed add one later. Given parameters to call with.   Orders Placed This Encounter  Procedures  . DG Bone Density    Standing Status:   Future    Standing Expiration Date:   08/08/2017    Order Specific Question:   Reason for Exam (SYMPTOM  OR DIAGNOSIS REQUIRED)    Answer:   screening    Order Specific Question:   Preferred imaging location?    Answer:   [facility]   [name], MD [place]

## 2016-06-08 NOTE — Assessment & Plan Note (Signed)
Patient will continue to monitor BP at home now that we are taking her off of HCTZ. I did discuss adding an additional agent though she opted to monitor at home and if needed add one later. Given parameters to call with.

## 2016-06-08 NOTE — Assessment & Plan Note (Signed)
Much improved. She'll finish her course of prednisone. We'll have her discontinue her hydrochlorothiazide given that this may worsen gout. She'll return in several weeks to have a uric acid checked to determine if she needs prophylactic treatment down the line.

## 2016-06-08 NOTE — Patient Instructions (Signed)
Nice to see you. I am glad you're gout is better. Please finish the course of prednisone. Please discontinue the hydrochlorothiazide. Please monitor your blood pressure. If it is consistently greater than 140/90 please contact us so we can start you on an additional medication. If you have recurrence of pain or you develop increased swelling, or any breathing issues please seek medical attention immediately.

## 2016-06-13 ENCOUNTER — Ambulatory Visit (INDEPENDENT_AMBULATORY_CARE_PROVIDER_SITE_OTHER): Payer: PPO

## 2016-06-13 DIAGNOSIS — I4891 Unspecified atrial fibrillation: Secondary | ICD-10-CM | POA: Diagnosis not present

## 2016-06-13 DIAGNOSIS — Z5181 Encounter for therapeutic drug level monitoring: Secondary | ICD-10-CM

## 2016-06-13 LAB — POCT INR: INR: 1.5

## 2016-06-19 ENCOUNTER — Ambulatory Visit (INDEPENDENT_AMBULATORY_CARE_PROVIDER_SITE_OTHER): Payer: PPO | Admitting: *Deleted

## 2016-06-19 DIAGNOSIS — Z950 Presence of cardiac pacemaker: Secondary | ICD-10-CM | POA: Diagnosis not present

## 2016-06-19 DIAGNOSIS — I481 Persistent atrial fibrillation: Secondary | ICD-10-CM

## 2016-06-19 DIAGNOSIS — I442 Atrioventricular block, complete: Secondary | ICD-10-CM | POA: Diagnosis not present

## 2016-06-19 DIAGNOSIS — I4819 Other persistent atrial fibrillation: Secondary | ICD-10-CM

## 2016-06-19 LAB — CUP PACEART INCLINIC DEVICE CHECK
Battery Impedance: 9400 Ohm
Battery Voltage: 2.68 V
Brady Statistic RA Percent Paced: 23 %
Brady Statistic RV Percent Paced: 99 %
Date Time Interrogation Session: 20180529132116
Implantable Lead Implant Date: 20090610
Implantable Lead Implant Date: 20090610
Implantable Lead Location: 753859
Implantable Lead Location: 753860
Implantable Lead Model: 5076
Implantable Pulse Generator Implant Date: 20090610
Lead Channel Impedance Value: 373 Ohm
Lead Channel Impedance Value: 475 Ohm
Lead Channel Pacing Threshold Amplitude: 0.75 V
Lead Channel Pacing Threshold Pulse Width: 0.5 ms
Lead Channel Sensing Intrinsic Amplitude: 0.8 mV
Lead Channel Sensing Intrinsic Amplitude: 12 mV
Lead Channel Setting Pacing Amplitude: 2 V
Lead Channel Setting Pacing Pulse Width: 0.5 ms
Lead Channel Setting Sensing Sensitivity: 2 mV
Pulse Gen Model: 5826
Pulse Gen Serial Number: 1236137

## 2016-06-19 NOTE — Progress Notes (Signed)
Pacemaker check in clinic. Normal device function. Thresholds, sensing, impedances consistent with previous measurements. Device programmed to maximize longevity. 90 mode switches, +warfarin. No episode triggers enabled. Device programmed at appropriate safety margins. Histogram distribution appropriate for patient activity level. Device programmed to optimize intrinsic conduction. Estimated longevity 0.5-1 year. Patient education completed. ROV with SK/B on 08/07/16.

## 2016-06-25 ENCOUNTER — Other Ambulatory Visit (INDEPENDENT_AMBULATORY_CARE_PROVIDER_SITE_OTHER): Payer: PPO

## 2016-06-25 DIAGNOSIS — M109 Gout, unspecified: Secondary | ICD-10-CM

## 2016-06-25 LAB — URIC ACID: Uric Acid, Serum: 8.4 mg/dL — ABNORMAL HIGH (ref 2.4–7.0)

## 2016-06-27 ENCOUNTER — Ambulatory Visit (INDEPENDENT_AMBULATORY_CARE_PROVIDER_SITE_OTHER): Payer: PPO | Admitting: *Deleted

## 2016-06-27 DIAGNOSIS — Z5181 Encounter for therapeutic drug level monitoring: Secondary | ICD-10-CM | POA: Diagnosis not present

## 2016-06-27 DIAGNOSIS — I4891 Unspecified atrial fibrillation: Secondary | ICD-10-CM | POA: Diagnosis not present

## 2016-06-27 LAB — POCT INR: INR: 3.3

## 2016-07-02 ENCOUNTER — Ambulatory Visit (INDEPENDENT_AMBULATORY_CARE_PROVIDER_SITE_OTHER): Payer: PPO | Admitting: Family Medicine

## 2016-07-02 ENCOUNTER — Encounter: Payer: Self-pay | Admitting: Family Medicine

## 2016-07-02 DIAGNOSIS — I10 Essential (primary) hypertension: Secondary | ICD-10-CM

## 2016-07-02 DIAGNOSIS — J438 Other emphysema: Secondary | ICD-10-CM

## 2016-07-02 DIAGNOSIS — M109 Gout, unspecified: Secondary | ICD-10-CM | POA: Diagnosis not present

## 2016-07-02 NOTE — Assessment & Plan Note (Signed)
Patient has had no recurrence of her symptoms. Discussed dietary contributors for gout. Given that she is only had one flare we will hold off on prophylactic treatment. She'll monitor for recurrence.

## 2016-07-02 NOTE — Progress Notes (Signed)
  [name], MD Phone: [phone]  Jessica [name] is a 83 y.o. female who presents today for follow-up.  Hypertension: Not checking at home. Is taking atenolol. Recently discontinued HCTZ. No chest pain. Her breathing is at her baseline for her COPD. No edema.  COPD: Patient notes no cough and wheezing though does note chronic mild dyspnea. She has been using Spiriva and Advair about 3 times a week. She uses her albuterol most days. She follows with pulmonology and they had recommended that she uses Spiriva daily previously.  Gout: She notes no additional issues with this. We rechecked her uric acid and it was still elevated.  PMH: Former smoker   ROS see history of present illness  Objective  Physical Exam Vitals:   07/02/16 1036  BP: (!) 142/80  Pulse: 71  Temp: 97.9 F (36.6 C)    BP Readings from Last 3 Encounters:  07/02/16 (!) 142/80  06/08/16 120/78  06/05/16 (!) 144/78   Wt Readings from Last 3 Encounters:  07/02/16 139 lb 3.2 oz (63.1 kg)  06/08/16 139 lb 9.6 oz (63.3 kg)  06/05/16 138 lb 6 oz (62.8 kg)    Physical Exam  Constitutional: No distress.  Cardiovascular: Normal rate, regular rhythm and normal heart sounds.   Pulmonary/Chest: Effort normal and breath sounds normal.  Musculoskeletal:  No evidence of podagra, no tenderness of right first MTP joint  Neurological: She is alert. Gait normal.  Skin: She is not diaphoretic.     Assessment/Plan: Please see individual problem list.  HYPERTENSION, BENIGN At goal for age. She'll continue atenolol.  COPD (chronic obstructive pulmonary disease) Is stable though does have chronic mild dyspnea. She's not appropriately using her Spiriva. Discussed using Spiriva daily. Albuterol as needed. She'll follow-up with her pulmonologist later this month.  Gout Patient has had no recurrence of her symptoms. Discussed dietary contributors for gout. Given that she is only had one flare we will hold off on  prophylactic treatment. She'll monitor for recurrence.   [name], MD [place]

## 2016-07-02 NOTE — Patient Instructions (Signed)
Nice to see you. Please use the Spiriva daily as prescribed. You can continue the albuterol as needed. Please see your pulmonologist later this month and see how your symptoms are doing at that time.

## 2016-07-02 NOTE — Assessment & Plan Note (Signed)
At goal for age. She'll continue atenolol.

## 2016-07-02 NOTE — Assessment & Plan Note (Signed)
Is stable though does have chronic mild dyspnea. She's not appropriately using her Spiriva. Discussed using Spiriva daily. Albuterol as needed. She'll follow-up with her pulmonologist later this month.

## 2016-07-11 ENCOUNTER — Ambulatory Visit (INDEPENDENT_AMBULATORY_CARE_PROVIDER_SITE_OTHER): Payer: PPO | Admitting: *Deleted

## 2016-07-11 DIAGNOSIS — I4891 Unspecified atrial fibrillation: Secondary | ICD-10-CM

## 2016-07-11 DIAGNOSIS — Z5181 Encounter for therapeutic drug level monitoring: Secondary | ICD-10-CM

## 2016-07-11 LAB — POCT INR: INR: 1.7

## 2016-07-19 ENCOUNTER — Ambulatory Visit (INDEPENDENT_AMBULATORY_CARE_PROVIDER_SITE_OTHER): Payer: PPO | Admitting: Pulmonary Disease

## 2016-07-19 ENCOUNTER — Encounter: Payer: Self-pay | Admitting: Pulmonary Disease

## 2016-07-19 VITALS — BP 140/80 | HR 75 | Ht 65.0 in | Wt 132.0 lb

## 2016-07-19 DIAGNOSIS — J449 Chronic obstructive pulmonary disease, unspecified: Secondary | ICD-10-CM | POA: Diagnosis not present

## 2016-07-19 NOTE — Patient Instructions (Signed)
Continue Spiriva Continue albuterol inhaler as her rescue medicine. You may use this more liberally (up to 3-4 times per day as needed)  Follow-up as needed or as directed by Dr. Caryl Bis

## 2016-07-20 NOTE — Progress Notes (Signed)
PULMONARY OFFICE FOLLOW UP NOTE  PT PROFILE: 83 y.o. F former smoker previously followed by Dr Stevenson Clinch for moderate (Gold C) COPD.  PROBLEMS:  Moderate COPD Former smoker  DATA: Spirometry 09/27/11: FVC 1.85 liters (61%), FEV1 0.97 liters (44%) CT chest 12/08/12: scarring or atelectasis in the lingula. No mass or endobronchial lesion identified. Mild to moderate emphysema  INTERVAL HISTORY: No major events  SUBJ: This is a routine reevaluation.. He has had no major change in his overall status. However he has noted increasing shortness of breath and exertional dyspnea attributed to the hot, humid weather of late. He remains on Spiriva as his only maintenance medication. He has an albuterol rescue inhaler which he uses on average once per day.Denies CP, fever, purulent sputum, hemoptysis, LE edema and calf tenderness   OBJ: Vitals:   07/19/16 1128 07/19/16 1130  BP:  140/80  Pulse:  75  SpO2:  93%  Weight: 132 lb (59.9 kg)   Height: 5\' 5"  (1.651 m)   RA  Gen: NAD HEENT: WNL Neck: NO LAN, no JVD noted Lungs: Mildly diminished without wheezes or other adventitious sounds Cardiovascular: Reg, no M noted Abdomen: Soft, NT +BS Ext: no C/C/E Neuro: grossly intact   DATA: No new CXR  IMPRESSION: 1) Moderate (Gold C) COPD 2) Former smoker   PLAN: 1) continue Spiriva inhaler - use daily 2) continue albuterol (Pro-air) inhaler as needed for cough, wheezing or shortness of breath. I suggested that he may use this more liberally on days when he has increased shortness of breath 3) follow-up as needed or as to right by Dr. Edythe Lynn, MD PCCM service Mobile [phone] Pager [phone] 07/20/2016

## 2016-08-01 ENCOUNTER — Ambulatory Visit (INDEPENDENT_AMBULATORY_CARE_PROVIDER_SITE_OTHER): Payer: PPO | Admitting: *Deleted

## 2016-08-01 DIAGNOSIS — I4891 Unspecified atrial fibrillation: Secondary | ICD-10-CM

## 2016-08-01 DIAGNOSIS — Z5181 Encounter for therapeutic drug level monitoring: Secondary | ICD-10-CM

## 2016-08-01 LAB — POCT INR: INR: 1.8

## 2016-08-07 ENCOUNTER — Encounter: Payer: Self-pay | Admitting: Internal Medicine

## 2016-08-14 ENCOUNTER — Encounter: Payer: Self-pay | Admitting: Internal Medicine

## 2016-08-14 ENCOUNTER — Ambulatory Visit (INDEPENDENT_AMBULATORY_CARE_PROVIDER_SITE_OTHER): Payer: PPO | Admitting: Internal Medicine

## 2016-08-14 VITALS — BP 149/87 | HR 70 | Ht 65.0 in | Wt 140.0 lb

## 2016-08-14 DIAGNOSIS — Z950 Presence of cardiac pacemaker: Secondary | ICD-10-CM

## 2016-08-14 DIAGNOSIS — I442 Atrioventricular block, complete: Secondary | ICD-10-CM | POA: Diagnosis not present

## 2016-08-14 DIAGNOSIS — I4819 Other persistent atrial fibrillation: Secondary | ICD-10-CM

## 2016-08-14 DIAGNOSIS — I481 Persistent atrial fibrillation: Secondary | ICD-10-CM | POA: Diagnosis not present

## 2016-08-14 DIAGNOSIS — R0602 Shortness of breath: Secondary | ICD-10-CM | POA: Diagnosis not present

## 2016-08-14 LAB — CUP PACEART INCLINIC DEVICE CHECK
Battery Impedance: 12700 Ohm
Battery Voltage: 2.67 V
Brady Statistic RA Percent Paced: 3.5 %
Brady Statistic RV Percent Paced: 99 %
Date Time Interrogation Session: 20180724145242
Implantable Lead Implant Date: 20090610
Implantable Lead Implant Date: 20090610
Implantable Lead Location: 753859
Implantable Lead Location: 753860
Implantable Lead Model: 5076
Implantable Pulse Generator Implant Date: 20090610
Lead Channel Impedance Value: 338 Ohm
Lead Channel Impedance Value: 465 Ohm
Lead Channel Pacing Threshold Amplitude: 0.75 V
Lead Channel Pacing Threshold Pulse Width: 0.5 ms
Lead Channel Sensing Intrinsic Amplitude: 1 mV
Lead Channel Sensing Intrinsic Amplitude: 3.5 mV
Lead Channel Setting Pacing Amplitude: 2 V
Lead Channel Setting Pacing Amplitude: 2.5 V
Lead Channel Setting Pacing Pulse Width: 0.5 ms
Lead Channel Setting Sensing Sensitivity: 2 mV
Pulse Gen Model: 5826
Pulse Gen Serial Number: 1236137

## 2016-08-14 NOTE — Progress Notes (Signed)
Patient Care Team: Leone Haven, MD as PCP - General (Family Medicine) Deboraha Sprang, MD (Cardiology) Minna Merritts, MD (Cardiology)   HPI  Jessica [name] is a 83 y.o. female seen in followup for a pacemaker implanted for complete heart block now status post device generator replacement.  She also has a history of paroxysmal atrial fibrillation and remains on Coumadin. We have in the past discussed NOACs. She has elected to stay on warfarin . He didn't have a a year's history She has a CHADS VASC score of 6 ; she is a prior TIA. She has had no recurrent palpitations.  She's had no problems with chest pain. There is no edema. She is largely without complaint  Dyspnea on exertion remains her major problem. Reviewing the pulmonary office evaluations, she was described as having stable COPD and no follow-up was anticipated. She does not have peripheral edema. She has no exertional chest discomfort.  She has a history of vascular disease but no known coronary disease although it is been years since her LV function or coronary artery bed has been evaluated.  Past Medical History:  Diagnosis Date  . AAA (abdominal aortic aneurysm) ([place])   . Abdominal aneurysm without mention of rupture   . Atrioventricular block, complete ([place])   . Cardiac pacemaker in situ   . Hernia   . Hyperlipidemia   . Hypertension     Past Surgical History:  Procedure Laterality Date  . ABDOMINAL AORTIC ANEURYSM REPAIR    . HERNIA REPAIR    . ohter     growth on colon surgery  . TONSILLECTOMY      Current Outpatient Prescriptions  Medication Sig Dispense Refill  . albuterol (PROAIR HFA) 108 (90 Base) MCG/ACT inhaler Inhale 2 puffs into the lungs every 6 (six) hours as needed for wheezing or shortness of breath. 1 Inhaler 5  . atenolol (TENORMIN) 25 MG tablet TAKE 1 TABLET BY MOUTH EVERY DAY. 180 tablet 1  . tiotropium (SPIRIVA) 18 MCG inhalation capsule Place 1 capsule (18 mcg total)  into inhaler and inhale daily. 60 capsule 10  . warfarin (COUMADIN) 5 MG tablet Take as directed by anticoagulation clinic 100 tablet 1   No current facility-administered medications for this visit.     Allergies  Allergen Reactions  . Codeine     Made her feel crazy  . Morphine     Made her feel crazy  . Zyrtec [Cetirizine]     Causes facial numbness    Review of Systems negative except from HPI and PMH  Physical Exam BP (!) 149/87 (BP Location: Left Arm, Patient Position: Sitting, Cuff Size: Normal)   Pulse 70   Ht 5\' 5"  (1.651 m)   Wt 140 lb (63.5 kg)   BMI 23.30 kg/m  Well developed and nourished in no acute distress HENT normal Neck supple with JVP 8+HJR Carotids brisk and full without bruits Clear Regular rate and rhythm, no murmurs or gallops Abd-soft with active BS without hepatomegaly No Clubbing cyanosis edema Skin-warm and dry A & Oriented  Grossly normal sensory and motor function  ECG personally reviewed  Atrial flutter flutter with ventricular pacing   Assessment and  Plan  Atrial fibrillation-permanent   Hypertension    AV block-complete -intermittent  Dyspnea on exertion  Peripheral vascular disease status post AAA repair  Pacemaker-[employer]  programmed VVIR   The patient has significant dyspnea on exertion. Differential diagnosis certainly includes her COPD.  I also wonder whether she could have some degree of diastolic heart failure with long-standing hypertension. She could also have ischemia based on her pretest probability driving from her peripheral vascular disease.  We will check a BNP to see if this supports a diagnosis of HFpEF  Undertake echocardiogram to look at LV function and E/E'  Myoview to exclude ischemia

## 2016-08-14 NOTE — Patient Instructions (Addendum)
Medication Instructions: - Your physician recommends that you continue on your current medications as directed. Please refer to the Current Medication list given to you today.  Labwork: - Your physician recommends that you have lab work today: BNP  Procedures/Testing: - Your physician has requested that you have an echocardiogram. Echocardiography is a painless test that uses sound waves to create images of your heart. It provides your doctor with information about the size and shape of your heart and how well your heart's chambers and valves are working. This procedure takes approximately one hour. There are no restrictions for this procedure.  - Your physician has requested that you have a lexiscan myoview. For further information please visit [email or web address].   [place]  Your caregiver has ordered a Stress Test with nuclear imaging. The purpose of this test is to evaluate the blood supply to your heart muscle. This procedure is referred to as a "Non-Invasive Stress Test." This is because other than having an IV started in your vein, nothing is inserted or "invades" your body. Cardiac stress tests are done to find areas of poor blood flow to the heart by determining the extent of coronary artery disease (CAD). Some patients exercise on a treadmill, which naturally increases the blood flow to your heart, while others who are  unable to walk on a treadmill due to physical limitations have a pharmacologic/chemical stress agent called Lexiscan . This medicine will mimic walking on a treadmill by temporarily increasing your coronary blood flow.   Please note: these test may take anywhere between 2-4 hours to complete  PLEASE REPORT TO [facility] AT THE FIRST DESK WILL DIRECT YOU WHERE TO GO  Date of Procedure:_____Thursday 7/26/18________________________________  Arrival Time for Procedure:______8:45 am________________________  Instructions regarding  medication:   ____ : Hold diabetes medication morning of procedure  __x__:  Hold betablocker(s) night before procedure and morning of procedure (Atenolol)   ____:  Hold other medications as follows:______________________________________________________________________________________________________________________________________  PLEASE NOTIFY THE OFFICE AT LEAST 24 HOURS IN ADVANCE IF YOU ARE UNABLE TO KEEP YOUR APPOINTMENT.  [phone] AND  PLEASE NOTIFY NUCLEAR MEDICINE AT [facility] AT LEAST 24 HOURS IN ADVANCE IF YOU ARE UNABLE TO KEEP YOUR APPOINTMENT. [phone]  How to prepare for your Myoview test:  1. Do not eat or drink after midnight 2. No caffeine for 24 hours prior to test 3. No smoking 24 hours prior to test. 4. Your medication may be taken with water.  If your doctor stopped a medication because of this test, do not take that medication. 5. Ladies, please do not wear dresses.  Skirts or pants are appropriate. Please wear a short sleeve shirt. 6. No perfume, cologne or lotion. 7. Wear comfortable walking shoes. No heels!  Follow-Up: - Your physician recommends that you schedule a follow-up appointment in: 2 months with Raquel Sarna, RN in the [facility].    Any Additional Special Instructions Will Be Listed Below (If Applicable).     If you need a refill on your cardiac medications before your next appointment, please call your pharmacy.

## 2016-08-15 ENCOUNTER — Ambulatory Visit (INDEPENDENT_AMBULATORY_CARE_PROVIDER_SITE_OTHER): Payer: PPO

## 2016-08-15 DIAGNOSIS — I4891 Unspecified atrial fibrillation: Secondary | ICD-10-CM

## 2016-08-15 DIAGNOSIS — Z5181 Encounter for therapeutic drug level monitoring: Secondary | ICD-10-CM

## 2016-08-15 LAB — POCT INR: INR: 1.8

## 2016-08-15 LAB — BRAIN NATRIURETIC PEPTIDE: BNP: 177 pg/mL — ABNORMAL HIGH (ref 0.0–100.0)

## 2016-08-16 ENCOUNTER — Ambulatory Visit
Admission: RE | Admit: 2016-08-16 | Discharge: 2016-08-16 | Disposition: A | Discharge status: Home / Self Care | Payer: PPO | Source: Ambulatory Visit | Attending: Internal Medicine | Admitting: Internal Medicine

## 2016-08-16 DIAGNOSIS — R0602 Shortness of breath: Secondary | ICD-10-CM | POA: Diagnosis not present

## 2016-08-16 LAB — NM MYOCAR MULTI W/SPECT W/WALL MOTION / EF
LV dias vol: 49 mL (ref 46–106)
LV sys vol: 14 mL
Peak HR: 75 {beats}/min
Percent HR: 55 %
Rest HR: 70 {beats}/min
SDS: 0
SRS: 7
SSS: 3
TID: 0.9

## 2016-08-16 MED ORDER — TECHNETIUM TC 99M TETROFOSMIN IV KIT
10.0000 | PACK | Freq: Once | INTRAVENOUS | Status: AC | PRN
  Administered 2016-08-16: 13.44 via INTRAVENOUS

## 2016-08-16 MED ORDER — REGADENOSON 0.4 MG/5ML IV SOLN
0.4000 mg | Freq: Once | INTRAVENOUS | Status: DC
  Filled 2016-08-16: qty 5

## 2016-08-16 MED ORDER — REGADENOSON 0.4 MG/5ML IV SOLN
0.4000 mg | Freq: Once | INTRAVENOUS | Status: AC
  Administered 2016-08-16: 0.4 mg via INTRAVENOUS

## 2016-08-16 MED ORDER — TECHNETIUM TC 99M TETROFOSMIN IV KIT
32.1010 | PACK | Freq: Once | INTRAVENOUS | Status: AC | PRN
  Administered 2016-08-16: 32.101 via INTRAVENOUS

## 2016-08-29 ENCOUNTER — Ambulatory Visit (INDEPENDENT_AMBULATORY_CARE_PROVIDER_SITE_OTHER): Payer: PPO

## 2016-08-29 DIAGNOSIS — I4891 Unspecified atrial fibrillation: Secondary | ICD-10-CM

## 2016-08-29 DIAGNOSIS — Z5181 Encounter for therapeutic drug level monitoring: Secondary | ICD-10-CM

## 2016-08-29 LAB — POCT INR: INR: 2.1

## 2016-08-30 ENCOUNTER — Other Ambulatory Visit: Payer: Self-pay

## 2016-08-30 ENCOUNTER — Ambulatory Visit (INDEPENDENT_AMBULATORY_CARE_PROVIDER_SITE_OTHER): Payer: PPO

## 2016-08-30 DIAGNOSIS — R0602 Shortness of breath: Secondary | ICD-10-CM | POA: Diagnosis not present

## 2016-09-07 ENCOUNTER — Telehealth: Payer: Self-pay | Admitting: Internal Medicine

## 2016-09-07 MED ORDER — FUROSEMIDE 20 MG PO TABS: ORAL_TABLET | ORAL | 3 refills | Status: DC

## 2016-09-07 NOTE — Telephone Encounter (Signed)
The patient is aware of her echo results. She is still having some SOB and is willing to try lasix 20 mg once daily x 4 days, then one tablet daily as needed for SOB/ swelling as recommended by Dr. Caryl Comes.  RX will be sent to [facility].

## 2016-09-19 ENCOUNTER — Ambulatory Visit (INDEPENDENT_AMBULATORY_CARE_PROVIDER_SITE_OTHER): Payer: PPO

## 2016-09-19 DIAGNOSIS — I4891 Unspecified atrial fibrillation: Secondary | ICD-10-CM | POA: Diagnosis not present

## 2016-09-19 DIAGNOSIS — Z5181 Encounter for therapeutic drug level monitoring: Secondary | ICD-10-CM | POA: Diagnosis not present

## 2016-09-19 LAB — POCT INR: INR: 2.8

## 2016-10-11 ENCOUNTER — Other Ambulatory Visit: Payer: Self-pay | Admitting: Cardiovascular Disease

## 2016-10-11 NOTE — Telephone Encounter (Signed)
Please review for refill, Thanks !  

## 2016-10-14 NOTE — Progress Notes (Signed)
Cardiology Office Note  Date:  10/16/2016   ID:  Jessica [name], DOB 1929/10/19, MRN [id number]  PCP:  Leone Haven, MD   Chief Complaint  Patient presents with  . other    6 month follow up. patient C/O SOB. Meds reviewed verbally with patient.     HPI:  Jessica [name] is a very pleasant 83 year old woman with a history of  Chronic atrial fib COPD, 50 years of smoking. peripheral vascular disease,  S/p open abdominal aortic aneurysm repair in 2009,  long smoking history who stopped >15 years ago ,  complete heart block status post pacemaker implant,  herna repair in 2009 with post op atrial fib,  HTN,  prior TIA per the notes,  carotid u/s showing mild carotid disease in 2011 with atypical vessel on the left contributing to her bruit,  previous pains in her neck radiating to her arms bilaterally occurring at night and awakening her from sleep, relieved by repositioning herself in bed.  She presents for routine follow up of her PAD  In follow-up today she reports that she is doing relatively well having some Memory issues Problem with crossword puzzles  Lives at [facility]  Denies any chest pain concerning for angina Active with no regular exercise program  Takes lasix as needed. Denies shortness of breath or leg swelling  Stopped crestor 5 mg daily on her own  Balance poor, occasionally uses cane, walker  no recent falls No recent lipid panel available  Previously havingSOB with exertion, taking spiriva, albuterol  EKG Personally reviewed by myself on today's visit shows paced rhythm with rate 70 bpm  Other past medical history reviewed ER August 12 2015 CT scan results as below showing significant stenosis of left renal artery after aneurysm Left flank pain has resolved, she was instructed to see vascular surgery in follow-up Dr. Ronalee Belts  CT ABD: 1. Normally patent aorto bi-iliac bypass graft. 2. 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 There is associated significant stenosis of the renal artery just along the distal margin of the aneurysm causing visible decrease in Jessica [name] to the left kidney compared to the right without infarction or significant renal atrophy. 3. Approximately 50- 70% moderate stenosis of the proximal inferior right renal artery. A small superior accessory right renal artery supplies the upper pole. 4. No significant occlusive disease of the celiac axis or superior mesenteric artery.  Last Myoview October 2012 showing no ischemia  PMH:   has a past medical history of AAA (abdominal aortic aneurysm) ([place]); Abdominal aneurysm without mention of rupture; Atrioventricular block, complete ([place]); Cardiac pacemaker in situ; Hernia; Hyperlipidemia; and Hypertension.  PSH:    Past Surgical History:  Procedure Laterality Date  . ABDOMINAL AORTIC ANEURYSM REPAIR    . HERNIA REPAIR    . ohter     growth on colon surgery  . TONSILLECTOMY      Current Outpatient Prescriptions  Medication Sig Dispense Refill  . albuterol (PROAIR HFA) 108 (90 Base) MCG/ACT inhaler Inhale 2 puffs into the lungs every 6 (six) hours as needed for wheezing or shortness of breath. 1 Inhaler 5  . atenolol (TENORMIN) 25 MG tablet TAKE 1 TABLET BY MOUTH EVERY DAY. 180 tablet 1  . furosemide (LASIX) 20 MG tablet Take 1 tablet (20 mg) by mouth once daily x 4 days, then take 1 tablet daily as needed for increased swelling/ shortness of breath 30 tablet 3  . tiotropium (  SPIRIVA) 18 MCG inhalation capsule Place 1 capsule (18 mcg total) into inhaler and inhale daily. 60 capsule 10  . warfarin (COUMADIN) 5 MG tablet TAKE AS DIRECTED BY ANTICOAGULATION CLINIC 100 tablet 0   No current facility-administered medications for this visit.      Allergies:   Codeine; Morphine; and Zyrtec [cetirizine]   Social History:  The patient  reports that she quit smoking about 21 years ago. Her smoking use included  Cigarettes. She has a 75.00 pack-year smoking history. She has never used smokeless tobacco. She reports that she drinks about 0.6 oz of alcohol per week . She reports that she does not use drugs.   Family History:   family history includes Alcohol abuse in her father; Cancer in her brother; Heart disease in her father.    Review of Systems: Review of Systems  Constitutional: Negative.   Respiratory: Negative.   Cardiovascular: Negative.   Gastrointestinal: Negative.   Musculoskeletal: Negative.        Gait instability  Neurological: Negative.   Psychiatric/Behavioral: Positive for memory loss.  All other systems reviewed and are negative.    PHYSICAL EXAM: VS:  BP 130/70 (BP Location: Left Arm, Patient Position: Sitting, Cuff Size: Normal)   Pulse 70   Ht 5\' 7"  (1.702 m)   Wt 140 lb 8 oz (63.7 kg)   BMI 22.01 kg/m  , BMI Body mass index is 22.01 kg/m.  No change from previous exam GEN: Well nourished, well developed, in no acute distress  HEENT: normal  Neck: no JVD, carotid bruits, or masses Cardiac: RRR; no murmurs, rubs, or gallops,no edema  Respiratory:  clear to auscultation bilaterally, normal work of breathing GI: soft, nontender, nondistended, + BS MS: no deformity or atrophy  Skin: warm and dry, no rash, large regions of ecchymoses on her legs Neuro:  Strength and sensation are intact Psych: euthymic mood, full affect    Recent Labs: 06/05/2016: Hemoglobin 14.4; Platelets 158.0 08/14/2016: BNP 177.0    Lipid Panel Lab Results  Component Value Date   CHOL 148 01/30/2013   HDL 46.30 01/30/2013   LDLCALC 78 01/30/2013   TRIG 117.0 01/30/2013      Wt Readings from Last 3 Encounters:  10/16/16 140 lb 8 oz (63.7 kg)  08/14/16 140 lb (63.5 kg)  07/19/16 132 lb (59.9 kg)       ASSESSMENT AND PLAN:  Atrial fibrillation, unspecified type ([place]) - Plan: EKG 12-Lead Chronic atrial fibrillation, tolerating warfarin, no recent falls, stable Pacemaker in  place  HYPERTENSION, BENIGN Blood pressure is well controlled on today's visit. No changes made to the medications.  Bilateral carotid artery stenosis Mild disease in the past Recommended cholesterol medication  Mixed hyperlipidemia Given diffuse PAD, recommended low-dose cholesterol medication  Atherosclerosis of aorto-iliac bypass graft (HCC) Followed by vascular surgery Has ultrasound scheduled next week per the patient  COPD exacerbation ([place]) Previous COPD exacerbation,  improved symptoms on prednisone and antibiotics.  No recent symptoms, stable  Long term current use of anticoagulant Long discussion concerning her warfarin, tolerating this well, no recent falls She is willing to stay on warfarin despite excessive bruising stable  PACEMAKER-[employer] Followed by Dr. Caryl Comes   Total encounter time more than 25 minutes  Greater than 50% was spent in counseling and coordination of care with the patient   Disposition:   F/U  6 months   Orders Placed This Encounter  Procedures  . Flu Vaccine QUAD 36+ mos IM  Signed, Esmond Plants, M.D., Ph.D. 10/16/2016  [place], [place]

## 2016-10-16 ENCOUNTER — Ambulatory Visit (INDEPENDENT_AMBULATORY_CARE_PROVIDER_SITE_OTHER): Payer: PPO | Admitting: *Deleted

## 2016-10-16 ENCOUNTER — Encounter: Payer: Self-pay | Admitting: Cardiovascular Disease

## 2016-10-16 ENCOUNTER — Ambulatory Visit (INDEPENDENT_AMBULATORY_CARE_PROVIDER_SITE_OTHER): Payer: PPO | Admitting: Cardiovascular Disease

## 2016-10-16 VITALS — BP 130/70 | HR 70 | Ht 67.0 in | Wt 140.5 lb

## 2016-10-16 DIAGNOSIS — I10 Essential (primary) hypertension: Secondary | ICD-10-CM

## 2016-10-16 DIAGNOSIS — Z950 Presence of cardiac pacemaker: Secondary | ICD-10-CM

## 2016-10-16 DIAGNOSIS — I714 Abdominal aortic aneurysm, without rupture, unspecified: Secondary | ICD-10-CM

## 2016-10-16 DIAGNOSIS — I482 Chronic atrial fibrillation, unspecified: Secondary | ICD-10-CM

## 2016-10-16 DIAGNOSIS — I442 Atrioventricular block, complete: Secondary | ICD-10-CM

## 2016-10-16 DIAGNOSIS — E782 Mixed hyperlipidemia: Secondary | ICD-10-CM

## 2016-10-16 DIAGNOSIS — Z23 Encounter for immunization: Secondary | ICD-10-CM | POA: Diagnosis not present

## 2016-10-16 DIAGNOSIS — I70309 Unspecified atherosclerosis of unspecified type of bypass graft(s) of the extremities, unspecified extremity: Secondary | ICD-10-CM | POA: Diagnosis not present

## 2016-10-16 LAB — CUP PACEART INCLINIC DEVICE CHECK
Battery Impedance: 17100 Ohm
Battery Voltage: 2.61 V
Brady Statistic RA Percent Paced: 1.7 %
Brady Statistic RV Percent Paced: 99 %
Date Time Interrogation Session: 20180925135655
Implantable Lead Implant Date: 20090610
Implantable Lead Implant Date: 20090610
Implantable Lead Location: 753859
Implantable Lead Location: 753860
Implantable Lead Model: 5076
Implantable Pulse Generator Implant Date: 20090610
Lead Channel Impedance Value: 405 Ohm
Lead Channel Impedance Value: 451 Ohm
Lead Channel Setting Pacing Amplitude: 2 V
Lead Channel Setting Pacing Amplitude: 2.5 V
Lead Channel Setting Pacing Pulse Width: 0.5 ms
Lead Channel Setting Sensing Sensitivity: 2 mV
Pulse Gen Model: 5826
Pulse Gen Serial Number: 1236137

## 2016-10-16 MED ORDER — ROSUVASTATIN CALCIUM 5 MG PO TABS: 5.0000 mg | ORAL_TABLET | Freq: Every day | ORAL | 3 refills | Status: DC

## 2016-10-16 NOTE — Progress Notes (Signed)
Pacemaker battery check in clinic. No testing performed. 2 AT/AF episodes, no EGMs enabled. Remaining longevity 0.25-0.5 years. Patient education completed. ROV with Device/B in 2 months for battery check (billable) and ROV with SK/B in 07/2017.

## 2016-10-16 NOTE — Patient Instructions (Addendum)
Medication Instructions:   Please restart crestor one a day  Labwork:  No new labs needed  Testing/Procedures:  No further testing at this time   Follow-Up: It was a pleasure seeing you in the office today. Please call us if you have new issues that need to be addressed before your next appt.  [phone]  Your physician wants you to follow-up in: 6 months.  You will receive a reminder letter in the mail two months in advance. If you don't receive a letter, please call our office to schedule the follow-up appointment.  If you need a refill on your cardiac medications before your next appointment, please call your pharmacy.

## 2016-10-17 ENCOUNTER — Ambulatory Visit (INDEPENDENT_AMBULATORY_CARE_PROVIDER_SITE_OTHER): Payer: PPO

## 2016-10-17 DIAGNOSIS — Z5181 Encounter for therapeutic drug level monitoring: Secondary | ICD-10-CM

## 2016-10-17 DIAGNOSIS — I482 Chronic atrial fibrillation, unspecified: Secondary | ICD-10-CM

## 2016-10-17 LAB — POCT INR: INR: 2.6

## 2016-10-19 ENCOUNTER — Ambulatory Visit: Payer: Self-pay | Admitting: Cardiovascular Disease

## 2016-10-22 ENCOUNTER — Encounter (INDEPENDENT_AMBULATORY_CARE_PROVIDER_SITE_OTHER): Payer: Self-pay | Admitting: Vascular Surgery

## 2016-10-22 ENCOUNTER — Ambulatory Visit (INDEPENDENT_AMBULATORY_CARE_PROVIDER_SITE_OTHER): Payer: PPO | Admitting: Vascular Surgery

## 2016-10-22 ENCOUNTER — Ambulatory Visit (INDEPENDENT_AMBULATORY_CARE_PROVIDER_SITE_OTHER): Payer: PPO

## 2016-10-22 VITALS — BP 126/72 | HR 74 | Resp 15 | Ht 67.0 in | Wt 139.0 lb

## 2016-10-22 DIAGNOSIS — I714 Abdominal aortic aneurysm, without rupture, unspecified: Secondary | ICD-10-CM

## 2016-10-22 DIAGNOSIS — I701 Atherosclerosis of renal artery: Secondary | ICD-10-CM

## 2016-10-22 DIAGNOSIS — I10 Essential (primary) hypertension: Secondary | ICD-10-CM

## 2016-10-22 DIAGNOSIS — I482 Chronic atrial fibrillation, unspecified: Secondary | ICD-10-CM

## 2016-10-22 DIAGNOSIS — I722 Aneurysm of renal artery: Secondary | ICD-10-CM

## 2016-10-22 NOTE — Progress Notes (Signed)
MRN : [id number]  Jessica [name] is a 83 y.o. (March 21, 1929) female who presents with chief complaint of  Chief Complaint  Patient presents with  . Follow-up    6 month renal   .  History of Present Illness: The patient returns to the office for followup and review of the noninvasive studies regarding renal vascular hypertension and renal artery stenosis. There have been no interval changes in the patient's blood pressure control.  He denies any major changes in is medications.  The patient denies headache or flushing.  No flank or unusual back pain.    There have been no significant changes to the patient's overall health care.  No interval shortening of the patient's walking distance or new symptoms consistent with claudication.  The patient denies the  development of rest pain symptoms. No new ulcers or wounds have occurred since the last visit.  The patient denies amaurosis fugax or recent TIA symptoms. There are no recent neurological changes noted. The patient denies history of DVT, PE or superficial thrombophlebitis. The patient denies recent episodes of angina or shortness of breath.   Duplex ultrasound of the renal arteries shows stable bialteral RAS with a 1.8 cm left renal artery aneurysm (no change compared to the last two studies)  No outpatient prescriptions have been marked as taking for the 10/22/16 encounter (Office Visit) with Delana Meyer, [name], MD.    Past Medical History:  Diagnosis Date  . AAA (abdominal aortic aneurysm) ([place])   . Abdominal aneurysm without mention of rupture   . Atrioventricular block, complete ([place])   . Cardiac pacemaker in situ   . Hernia   . Hyperlipidemia   . Hypertension     Past Surgical History:  Procedure Laterality Date  . ABDOMINAL AORTIC ANEURYSM REPAIR    . HERNIA REPAIR    . ohter     growth on colon surgery  . TONSILLECTOMY      Social History Social History  Substance Use Topics  . Smoking status: Former Smoker   Packs/day: 1.50    Years: 50.00    Types: Cigarettes    Quit date: 10/03/1995  . Smokeless tobacco: Never Used  . Alcohol use 0.6 oz/week    1 Glasses of wine per week     Comment: per week    Family History Family History  Problem Relation Age of Onset  . Heart disease Father   . Alcohol abuse Father   . Cancer Brother     Allergies  Allergen Reactions  . Codeine     Made her feel crazy  . Morphine     Made her feel crazy  . Zyrtec [Cetirizine]     Causes facial numbness     REVIEW OF SYSTEMS (Negative unless checked)  Constitutional: [] Weight loss  [] Fever  [] Chills Cardiac: [] Chest pain   [] Chest pressure   [] Palpitations   [] Shortness of breath when laying flat   [] Shortness of breath with exertion. Vascular:  [] Pain in legs with walking   [] Pain in legs at rest  [] History of DVT   [] Phlebitis   [] Swelling in legs   [] Varicose veins   [] Non-healing ulcers Pulmonary:   [] Uses home oxygen   [] Productive cough   [] Hemoptysis   [] Wheeze  [] COPD   [] Asthma Neurologic:  [] Dizziness   [] Seizures   [] History of stroke   [] History of TIA  [] Aphasia   [] Vissual changes   [] Weakness or numbness in arm   [] Weakness or numbness in  leg Musculoskeletal:   [] Joint swelling   [] Joint pain   [] Low back pain Hematologic:  [] Easy bruising  [] Easy bleeding   [] Hypercoagulable state   [] Anemic Gastrointestinal:  [] Diarrhea   [] Vomiting  [] Gastroesophageal reflux/heartburn   [] Difficulty swallowing. Genitourinary:  [] Chronic kidney disease   [] Difficult urination  [] Frequent urination   [] Blood in urine Skin:  [] Rashes   [] Ulcers  Psychological:  [] History of anxiety   []  History of major depression.  Physical Examination  Vitals:   10/22/16 0944  BP: 126/72  Pulse: 74  Resp: 15  Weight: 139 lb (63 kg)  Height: 5\' 7"  (1.702 m)   Body mass index is 21.77 kg/m. Gen: WD/WN, NAD Head: Green/AT, No temporalis wasting.  Ear/Nose/Throat: Hearing grossly intact, nares w/o erythema or  drainage Eyes: PER, EOMI, sclera nonicteric.  Neck: Supple, no large masses.   Pulmonary:  Good air movement, no audible wheezing bilaterally, no use of accessory muscles.  Cardiac: RRR, no JVD Vascular: No abdominal or flank bruits, no carotid bruits Vessel Right Left  Radial Palpable Palpable  Ulnar Palpable Palpable  Brachial Palpable Palpable  Carotid Palpable Palpable  Gastrointestinal: Non-distended. No guarding/no peritoneal signs.  Musculoskeletal: M/S 5/5 throughout.  No deformity or atrophy.  Neurologic: CN 2-12 intact. Symmetrical.  Speech is fluent. Motor exam as listed above. Psychiatric: Judgment intact, Mood & affect appropriate for pt's clinical situation. Dermatologic: No rashes or ulcers noted.  No changes consistent with cellulitis. Lymph : No lichenification or skin changes of chronic lymphedema.  CBC Lab Results  Component Value Date   WBC 7.3 06/05/2016   HGB 14.4 06/05/2016   HCT 42.1 06/05/2016   MCV 94.3 06/05/2016   PLT 158.0 06/05/2016    BMET    Component Value Date/Time   NA 136 08/12/2015 0706   NA 141 08/17/2014 1148   NA 141 12/15/2013 1721   K 4.0 08/12/2015 0706   K 3.1 (L) 12/15/2013 1721   CL 101 08/12/2015 0706   CL 103 12/15/2013 1721   CO2 27 08/12/2015 0706   CO2 28 12/15/2013 1721   GLUCOSE 115 (H) 08/12/2015 0706   GLUCOSE 115 (H) 12/15/2013 1721   BUN 19 08/12/2015 0706   BUN 19 08/17/2014 1148   BUN 26 (H) 12/15/2013 1721   CREATININE 1.36 (H) 08/12/2015 0706   CREATININE 1.40 (H) 12/15/2013 1721   CALCIUM 9.1 08/12/2015 0706   CALCIUM 9.3 12/15/2013 1721   GFRNONAA 35 (L) 08/12/2015 0706   GFRNONAA 38 (L) 12/15/2013 1721   GFRAA 40 (L) 08/12/2015 0706   GFRAA 46 (L) 12/15/2013 1721   CrCl cannot be calculated (Patient's most recent lab result is older than the maximum 21 days allowed.).  COAG Lab Results  Component Value Date   INR 2.6 10/17/2016   INR 2.8 09/19/2016   INR 2.1 08/29/2016    Radiology No  results found.  Assessment/Plan 1. Renal artery stenosis (HCC) Given patient's arterial disease optimal control of the patient's hypertension is important. BP is acceptable today  The patient's vital signs and noninvasive studies support the renal artery stenosis is not significantly increased when compared to the previous study.  No invasive studies or intervention is indicated at this time.  The patient will continue the current antihypertensive medications, no changes at this time.  The primary medical service will continue aggressive antihypertensive therapy as per the AHA guidelines   2. Renal artery aneurysm [facility]) The left renal artery aneurysm is no large enough to repair  Continue to follow  3. AAA (abdominal aortic aneurysm) without rupture (HCC) Recommend: Patient is status post successful open repair of the AAA.   No further intervention is required at this time.    The patient will continue antiplatelet therapy as prescribed as well as aggressive management of hyperlipidemia. Exercise is again strongly encouraged.    The patient is reminded that lifelong routine surveillance is a necessity with an endograft. Patient will continue to follow-up at 12 month intervals with ultrasound of the aorta.  4. Chronic atrial fibrillation (HCC) Continue antiarrhythmia medications as already ordered, these medications have been reviewed and there are no changes at this time.  Continue anticoagulation as ordered by Cardiology Service   5. HYPERTENSION, BENIGN Continue antihypertensive medications as already ordered, these medications have been reviewed and there are no changes at this time.     Hortencia Pilar, MD  10/22/2016 1:00 PM

## 2016-12-03 ENCOUNTER — Ambulatory Visit: Payer: PPO

## 2016-12-03 DIAGNOSIS — I482 Chronic atrial fibrillation, unspecified: Secondary | ICD-10-CM

## 2016-12-03 DIAGNOSIS — Z5181 Encounter for therapeutic drug level monitoring: Secondary | ICD-10-CM

## 2016-12-03 LAB — POCT INR: INR: 2.9

## 2016-12-10 ENCOUNTER — Telehealth: Payer: Self-pay | Admitting: *Deleted

## 2016-12-10 NOTE — Telephone Encounter (Signed)
Patient going to walkin or UC tomorrow, no appointment available.

## 2016-12-10 NOTE — Telephone Encounter (Signed)
Copied from [place] [id number]. Topic: Appointment Scheduling - Scheduling Inquiry for Clinic >> Dec 10, 2016  8:52 AM Cathey Endow C wrote: Reason for CRM: Patient would like to be seen today for gout.  She requested Dr. Caryl Bis but he's out this week & the other providers doesn't have availability.  She would like a call @ [phone]

## 2016-12-11 ENCOUNTER — Ambulatory Visit (INDEPENDENT_AMBULATORY_CARE_PROVIDER_SITE_OTHER): Payer: PPO | Admitting: *Deleted

## 2016-12-11 DIAGNOSIS — I482 Chronic atrial fibrillation, unspecified: Secondary | ICD-10-CM

## 2016-12-11 DIAGNOSIS — I442 Atrioventricular block, complete: Secondary | ICD-10-CM | POA: Diagnosis not present

## 2016-12-11 DIAGNOSIS — Z950 Presence of cardiac pacemaker: Secondary | ICD-10-CM

## 2016-12-11 LAB — CUP PACEART INCLINIC DEVICE CHECK
Battery Impedance: 22900 Ohm
Battery Remaining Longevity: 3 mo
Battery Voltage: 2.58 V
Brady Statistic RA Percent Paced: 11 %
Brady Statistic RV Percent Paced: 99 %
Date Time Interrogation Session: 20181120150653
Implantable Lead Implant Date: 20090610
Implantable Lead Implant Date: 20090610
Implantable Lead Location: 753859
Implantable Lead Location: 753860
Implantable Lead Model: 5076
Implantable Pulse Generator Implant Date: 20090610
Lead Channel Impedance Value: 388 Ohm
Lead Channel Impedance Value: 481 Ohm
Lead Channel Pacing Threshold Amplitude: 0.75 V
Lead Channel Pacing Threshold Pulse Width: 0.5 ms
Lead Channel Sensing Intrinsic Amplitude: 0.6 mV
Lead Channel Sensing Intrinsic Amplitude: 12 mV
Lead Channel Setting Pacing Amplitude: 2 V
Lead Channel Setting Pacing Amplitude: 2.5 V
Lead Channel Setting Pacing Pulse Width: 0.5 ms
Lead Channel Setting Sensing Sensitivity: 2 mV
Pulse Gen Model: 5826
Pulse Gen Serial Number: 1236137

## 2016-12-11 NOTE — Progress Notes (Signed)
Pacemaker check in clinic. Normal device function, ERI as of 12/05/16. Thresholds, sensing, impedances consistent with previous measurements. Device programmed to maximize longevity. 22 AT/AF episodes (91% burden), +warfarin. No episode triggers enabled. Device programmed at appropriate safety margins. Histogram distribution appropriate for patient activity level. Device programmed to optimize intrinsic conduction. Patient education completed. ROV with SK/B on 01/24/17 to discuss gen change.

## 2016-12-12 ENCOUNTER — Encounter: Payer: Self-pay | Admitting: Internal Medicine

## 2016-12-12 ENCOUNTER — Ambulatory Visit (INDEPENDENT_AMBULATORY_CARE_PROVIDER_SITE_OTHER): Payer: PPO

## 2016-12-12 ENCOUNTER — Ambulatory Visit: Payer: PPO | Admitting: Internal Medicine

## 2016-12-12 VITALS — BP 140/82 | HR 62 | Temp 97.9°F | Ht 67.0 in | Wt 136.0 lb

## 2016-12-12 DIAGNOSIS — M79672 Pain in left foot: Secondary | ICD-10-CM | POA: Diagnosis not present

## 2016-12-12 DIAGNOSIS — M109 Gout, unspecified: Secondary | ICD-10-CM

## 2016-12-12 MED ORDER — PREDNISONE 20 MG PO TABS: ORAL_TABLET | ORAL | 0 refills | Status: DC

## 2016-12-12 NOTE — Progress Notes (Signed)
Chief Complaint  Patient presents with  . Gout   Pt presents for left 1st and 2nd foot pain and redness. H/o gout flare 05/2016. Pain is 10/10. Sx's started Sunday at 3 am pain is worse at the bottom of her foot and denies trauma. Pain is throbbing and now there is swelling. Pain was worse Monday and had redness. Pain is worse with standing. She has only tried Tylenol 500 mg x 1. Denies h/o trauma. She did prednisone taper 05/2016 which helped. Pain is worse on bottom of foot   Review of Systems  Respiratory: Negative for shortness of breath.   Cardiovascular: Negative for chest pain.  Musculoskeletal: Positive for joint pain.   Past Medical History:  Diagnosis Date  . AAA (abdominal aortic aneurysm) ([place])   . Abdominal aneurysm without mention of rupture   . Atrioventricular block, complete ([place])   . Cardiac pacemaker in situ   . Gout   . Hernia   . Hyperlipidemia   . Hypertension    Past Surgical History:  Procedure Laterality Date  . ABDOMINAL AORTIC ANEURYSM REPAIR    . HERNIA REPAIR    . ohter     growth on colon surgery  . TONSILLECTOMY     Family History  Problem Relation Age of Onset  . Heart disease Father   . Alcohol abuse Father   . Cancer Brother    Social History   Socioeconomic History  . Marital status: Widowed    Spouse name: Not on file  . Number of children: 2  . Years of education: Not on file  . Highest education level: Not on file  Social Needs  . Financial resource strain: Not on file  . Food insecurity - worry: Not on file  . Food insecurity - inability: Not on file  . Transportation needs - medical: Not on file  . Transportation needs - non-medical: Not on file  Occupational History  . Occupation: Retired    Fish farm manager: RETIRED  Tobacco Use  . Smoking status: Former Smoker    Packs/day: 1.50    Years: 50.00    Pack years: 75.00    Types: Cigarettes    Last attempt to quit: 10/03/1995    Years since quitting: 21.2  . Smokeless tobacco:  Never Used  Substance and Sexual Activity  . Alcohol use: Yes    Alcohol/week: 0.6 oz    Types: 1 Glasses of wine per week    Comment: per week  . Drug use: No  . Sexual activity: No  Other Topics Concern  . Not on file  Social History Narrative   Born in [place]. Lives in [place]. Son lives nearby.      No regular exercise    Current Meds  Medication Sig  . albuterol (PROAIR HFA) 108 (90 Base) MCG/ACT inhaler Inhale 2 puffs into the lungs every 6 (six) hours as needed for wheezing or shortness of breath.  Marland Kitchen atenolol (TENORMIN) 25 MG tablet TAKE 1 TABLET BY MOUTH EVERY DAY.  . furosemide (LASIX) 20 MG tablet Take 1 tablet (20 mg) by mouth once daily x 4 days, then take 1 tablet daily as needed for increased swelling/ shortness of breath  . rosuvastatin (CRESTOR) 5 MG tablet Take 1 tablet (5 mg total) by mouth daily.  Marland Kitchen tiotropium (SPIRIVA) 18 MCG inhalation capsule Place 1 capsule (18 mcg total) into inhaler and inhale daily.  Marland Kitchen warfarin (COUMADIN) 5 MG tablet TAKE AS DIRECTED BY ANTICOAGULATION CLINIC   Allergies  Allergen Reactions  . Codeine     Made her feel crazy  . Morphine     Made her feel crazy  . Zyrtec [Cetirizine]     Causes facial numbness   Vitals:   12/12/16 0807  Weight: 136 lb (61.7 kg)  Height: 5\' 7"  (1.702 m)   Recent Results (from the past 2160 hour(s))  POCT INR     Status: None   Collection Time: 09/19/16  1:53 PM  Result Value Ref Range   INR 2.8   CUP PACEART INCLINIC DEVICE CHECK     Status: Abnormal   Collection Time: 10/16/16  5:52 PM  Result Value Ref Range   Date Time Interrogation Session [id number]    Pulse Generator Manufacturer SJCR    Pulse Gen Model 5826 Zephyr XL DR    Pulse Gen Serial Number [id number]    Clinic Name [facility]    Implantable Pulse Generator Type Implantable Pulse Generator    Implantable Pulse Generator Implant Date [id number]    Implantable Lead Manufacturer MERM    Implantable Lead Model 5076 CapSureFix Novus     Implantable Lead Serial Number [id number]    Implantable Lead Implant Date [id number]    Implantable Lead Location Detail 1 APPENDAGE    Implantable Lead Location [id number]    Implantable Lead Manufacturer SJCR    Implantable Lead Model 1688TC Tendril SDX    Implantable Lead Serial Number [id number]    Implantable Lead Implant Date [id number]    Implantable Lead Location Detail 1 APEX    Implantable Lead Location [id number]    Lead Channel Setting Sensing Sensitivity 2.0 mV   Lead Channel Setting Pacing Amplitude 2.00 V   Lead Channel Setting Pacing Pulse Width 0.5 ms   Lead Channel Setting Pacing Amplitude 2.50 V   Lead Channel Impedance Value 405 ohm   Lead Channel Impedance Value 451 ohm   Battery Status Unknown    Battery Remaining Longevity 0.25 - 0.50 years    Battery Voltage 2.61 V   Battery Impedance 17,100.0 ohm   Brady Statistic RA Percent Paced 1.7 %   Brady Statistic RV Percent Paced 99 (>) %   Eval Rhythm AF/Vp @ 70   POCT INR     Status: None   Collection Time: 10/17/16  1:59 PM  Result Value Ref Range   INR 2.6   POCT INR     Status: None   Collection Time: 12/03/16  1:10 PM  Result Value Ref Range   INR 2.9   CUP PACEART INCLINIC DEVICE CHECK     Status: None   Collection Time: 12/11/16  7:53 PM  Result Value Ref Range   Date Time Interrogation Session [phone]    Pulse Generator Manufacturer SJCR    Pulse Gen Model 5826 Zephyr XL DR    Pulse Gen Serial Number [id number]    Clinic Name [facility]    Implantable Pulse Generator Type Implantable Pulse Generator    Implantable Pulse Generator Implant Date [id number]    Implantable Lead Manufacturer [facility]    Implantable Lead Model 5076 CapSureFix Novus    Implantable Lead Serial Number [id number]    Implantable Lead Implant Date [id number]    Implantable Lead Location Detail 1 APPENDAGE    Implantable Lead Location [id number]    Implantable Lead Manufacturer [facility]    Implantable Lead Model 1688TC Tendril SDX    Implantable  Lead Serial Number [id number]    Implantable Lead Implant Date [id number]    Implantable Lead  Location Detail 1 APEX    Implantable Lead Location [id number]    Lead Channel Setting Sensing Sensitivity 2.0 mV   Lead Channel Setting Pacing Amplitude 2.00 V   Lead Channel Setting Pacing Pulse Width 0.5 ms   Lead Channel Setting Pacing Amplitude 2.50 V   Lead Channel Impedance Value 388 ohm   Lead Channel Sensing Intrinsic Amplitude 0.6 mV   Lead Channel Impedance Value 481 ohm   Lead Channel Sensing Intrinsic Amplitude 12.0 mV   Lead Channel Pacing Threshold Amplitude 0.75 V   Lead Channel Pacing Threshold Pulse Width 0.5 ms   Battery Status Unknown    Battery Remaining Longevity 3 mo   Battery Voltage 2.58 V   Battery Impedance 22,900.0 ohm   Brady Statistic RA Percent Paced 11 %   Brady Statistic RV Percent Paced 99 %   Eval Rhythm AF/Vs 46-52    Vitals:   12/12/16 0807  BP: 140/82  Pulse: 62  Temp: 97.9 F (36.6 C)  SpO2: 94%   Objective  Physical Exam  Constitutional: She is oriented to person, place, and time and well-developed, well-nourished, and in no distress.  HENT:  Head: Normocephalic and atraumatic.  Mouth/Throat: Oropharynx is clear and moist and mucous membranes are normal.  Eyes: Pupils are equal, round, and reactive to light.  Cardiovascular: Normal rate, regular rhythm and normal heart sounds.  Pulmonary/Chest: Effort normal and breath sounds normal.  Abdominal: Soft. Bowel sounds are normal.  Musculoskeletal:  Left foot with redness and swelling left MTP and 2nd toe  Mod ttp on exam in these area   Neurological: She is alert and oriented to person, place, and time.  BL walks with rollator   Skin: Skin is warm, dry and intact.  Psychiatric: Mood, memory, affect and judgment normal.  Nursing note and vitals reviewed.  Assessment   1. Gout   Plan   1. Prednisone taper  Can also do tylenol prn pain max dose 3000/qd total  Xray left foot  F/u in 2 weeks   Given info about gout and diet  Consider allopurinol in future when flare resolved. Not candidate for colchicine or NSAIDS  Check uric acid in future as well    Provider: Dr. Olivia Mackie McLean-Scocuzza

## 2016-12-12 NOTE — Patient Instructions (Addendum)
1. Please follow up in 2 weeks  2. You can try Tylenol for pain up to 3000 mg total in 1 day so if 500 up to 6 pills  3. We will do Prednisone taper for inflammation and when pain improved consider Allopurinol and blood test for uric acid in the future, Xray today  4. See Below foods bad and good for gout   Gout Gout is painful swelling that can happen in some of your joints. Gout is a type of arthritis. This condition is caused by having too much uric acid in your body. Uric acid is a chemical that is made when your body breaks down substances called purines. If your body has too much uric acid, sharp crystals can form and build up in your joints. This causes pain and swelling. Gout attacks can happen quickly and be very painful (acute gout). Over time, the attacks can affect more joints and happen more often (chronic gout). Follow these instructions at home: During a Gout Attack  If directed, put ice on the painful area: ? Put ice in a plastic bag. ? Place a towel between your skin and the bag. ? Leave the ice on for 20 minutes, 2-3 times a day.  Rest the joint as much as possible. If the joint is in your leg, you may be given crutches to use.  Raise (elevate) the painful joint above the level of your heart as often as you can.  Drink enough fluids to keep your pee (urine) clear or pale yellow.  Take over-the-counter and prescription medicines only as told by your doctor.  Do not drive or use heavy machinery while taking prescription pain medicine.  Follow instructions from your doctor about what you can or cannot eat and drink.  Return to your normal activities as told by your doctor. Ask your doctor what activities are safe for you. Avoiding Future Gout Attacks  Follow a low-purine diet as told by a specialist (dietitian) or your doctor. Avoid foods and drinks that have a lot of purines, such  as: ? Liver. ? Kidney. ? Anchovies. ? Asparagus. ? Herring. ? Mushrooms ? Mussels. ? Beer.  Limit alcohol intake to no more than 1 drink a day for nonpregnant women and 2 drinks a day for men. One drink equals 12 oz of beer, 5 oz of wine, or 1 oz of hard liquor.  Stay at a healthy weight or lose weight if you are overweight. If you want to lose weight, talk with your doctor. It is important that you do not lose weight too fast.  Start or continue an exercise plan as told by your doctor.  Drink enough fluids to keep your pee clear or pale yellow.  Take over-the-counter and prescription medicines only as told by your doctor.  Keep all follow-up visits as told by your doctor. This is important.  Foods GOOD for gout  Vitamin C may lower uric acid levels 500 mg  Cherries may help  Low fat dairy   Contact a doctor if:  You have another gout attack.  You still have symptoms of a gout attack after10 days of treatment.  You have problems (side effects) because of your medicines.  You have chills or a fever.  You have burning pain when you pee (urinate).  You have pain in your lower back or belly. Get help right away if:  You have very bad pain.  Your pain cannot be controlled.  You cannot pee. This information is  not intended to replace advice given to you by your health care provider. Make sure you discuss any questions you have with your health care provider. Document Released: 10/18/2007 Document Revised: 06/16/2015 Document Reviewed: 10/21/2014 Elsevier Interactive Patient Education  [employer].

## 2017-01-01 ENCOUNTER — Ambulatory Visit: Payer: Self-pay | Admitting: Internal Medicine

## 2017-01-16 ENCOUNTER — Ambulatory Visit (INDEPENDENT_AMBULATORY_CARE_PROVIDER_SITE_OTHER): Payer: PPO

## 2017-01-16 DIAGNOSIS — I4891 Unspecified atrial fibrillation: Secondary | ICD-10-CM

## 2017-01-16 DIAGNOSIS — Z5181 Encounter for therapeutic drug level monitoring: Secondary | ICD-10-CM

## 2017-01-16 DIAGNOSIS — I482 Chronic atrial fibrillation, unspecified: Secondary | ICD-10-CM

## 2017-01-16 LAB — POCT INR: INR: 2.9

## 2017-01-16 MED ORDER — WARFARIN SODIUM 5 MG PO TABS: ORAL_TABLET | ORAL | 0 refills | Status: DC

## 2017-01-16 NOTE — Patient Instructions (Signed)
Continue taking 1 tablet daily except 1.5 tablets on Wednesdays.  Recheck in 6 weeks.

## 2017-01-24 ENCOUNTER — Encounter: Payer: Self-pay | Admitting: *Deleted

## 2017-01-24 ENCOUNTER — Encounter: Payer: Self-pay | Admitting: Internal Medicine

## 2017-01-24 ENCOUNTER — Ambulatory Visit (INDEPENDENT_AMBULATORY_CARE_PROVIDER_SITE_OTHER): Payer: PPO | Admitting: Internal Medicine

## 2017-01-24 VITALS — BP 114/74 | HR 62 | Ht 67.0 in | Wt 136.2 lb

## 2017-01-24 DIAGNOSIS — I482 Chronic atrial fibrillation, unspecified: Secondary | ICD-10-CM

## 2017-01-24 DIAGNOSIS — Z950 Presence of cardiac pacemaker: Secondary | ICD-10-CM | POA: Diagnosis not present

## 2017-01-24 DIAGNOSIS — I10 Essential (primary) hypertension: Secondary | ICD-10-CM

## 2017-01-24 DIAGNOSIS — I442 Atrioventricular block, complete: Secondary | ICD-10-CM

## 2017-01-24 DIAGNOSIS — Z01812 Encounter for preprocedural laboratory examination: Secondary | ICD-10-CM | POA: Diagnosis not present

## 2017-01-24 NOTE — Progress Notes (Signed)
Patient Care Team: Leone Haven, MD as PCP - General (Family Medicine) Deboraha Sprang, MD (Cardiology) Minna Merritts, MD (Cardiology)   HPI  Jessica [name] is a 84 y.o. female With Chief Complaint of a pacemaker implanted for complete heart block. And increasing fatigue of late.  She has noted her heart rate has been slowing down. Please had no syncope.  Denies chest pain worsening shortness of breath.  She has had no edema.  Device History: Pacemaker  implanted 1980  for CHB Changed out 2009   Date Lead Status   1980 CPI 0505 Abandoned   2009 A 5076   2009 V 1688     We have in the past discussed NOACs. She has elected to stay on warfarin .    Thromboembolic risk factors ( age  -2, HTN-1, TIA/CVA-2, Vasc disease -1, Gender-1) for a CHADSVASc Score of 7    DATE TEST    7/18 Myoview   EF 65 % No ischemia  8//18 Echo    EF 65 % Mod LVH        Date Cr K Hgb  7/17 1.36 4.0   5/18     14.4           Past Medical History:  Diagnosis Date  . AAA (abdominal aortic aneurysm) ([place])   . Abdominal aneurysm without mention of rupture   . Atrioventricular block, complete ([place])   . Cardiac pacemaker in situ   . Gout   . Hernia   . Hyperlipidemia   . Hypertension     Past Surgical History:  Procedure Laterality Date  . ABDOMINAL AORTIC ANEURYSM REPAIR    . HERNIA REPAIR    . ohter     growth on colon surgery  . TONSILLECTOMY      Current Outpatient Medications  Medication Sig Dispense Refill  . albuterol (PROAIR HFA) 108 (90 Base) MCG/ACT inhaler Inhale 2 puffs into the lungs every 6 (six) hours as needed for wheezing or shortness of breath. 1 Inhaler 5  . atenolol (TENORMIN) 25 MG tablet TAKE 1 TABLET BY MOUTH EVERY DAY. 180 tablet 1  . tiotropium (SPIRIVA) 18 MCG inhalation capsule Place 1 capsule (18 mcg total) into inhaler and inhale daily. 60 capsule 10  . warfarin (COUMADIN) 5 MG tablet TAKE AS DIRECTED BY ANTICOAGULATION CLINIC 100 tablet  0   No current facility-administered medications for this visit.     Allergies  Allergen Reactions  . Codeine     Made her feel crazy  . Morphine     Made her feel crazy  . Zyrtec [Cetirizine]     Causes facial numbness    Review of Systems negative except from HPI and PMH  Physical Exam BP 114/74 (BP Location: Left Arm, Patient Position: Sitting, Cuff Size: Normal)   Pulse 62   Ht 5\' 7"  (1.702 m)   Wt 136 lb 4 oz (61.8 kg)   BMI 21.34 kg/m  Well developed and nourished in no acute distress HENT normal Neck supple with JVP-flat Clear Device pocket well healed; without hematoma or erythema.  There is no tethering  Regular rate and rhythm, no murmurs or gallops Abd-soft with active BS No Clubbing cyanosis edema Skin-warm and dry A & Oriented  Grossly normal sensory and motor function  ECG personally reviewed  Sinus rhythm with complete heart block Intermittent failure to his atrial sense Ventricularly pacing  Assessment and  Plan  Atrial fibrillation-persistent  Hypertension    AV block-complete -intermittent  Dyspnea on exertion  Grade V3 renal insufficiency  Peripheral vascular disease status post AAA repair  Pacemaker-St. Jude     Device is reached ERI.  Will require generator replacement.  She has a 40-year lead present and so would use antimicrobial pouch  I have reprogrammed to DDI--DDD now that she is in sinus rhythm.  Hopefully the restoration of AV synchrony will help her to feel better.  She would prefer a regular generator replacement but this would have to be deferred until I return.  She is no better over the next 24+ hours, unable to having 1 of my partners doing next week.  Blood pressures well controlled  We need to check hemoglobin on her anticoagulant and her renal function which is tended towards grade 3

## 2017-01-24 NOTE — Patient Instructions (Signed)
Medication Instructions: - Your physician recommends that you continue on your current medications as directed. Please refer to the Current Medication list given to you today.  Labwork: - Your physician recommends that you have lab work today: BMP/CBC/ INR  Procedures/Testing: - Your physician has recommended that you have a pacemaker generator (battery) change  Follow-Up: - Your physician recommends that you schedule a follow-up appointment in: pending   Any Additional Special Instructions Will Be Listed Below (If Applicable).     If you need a refill on your cardiac medications before your next appointment, please call your pharmacy.

## 2017-01-25 ENCOUNTER — Telehealth: Payer: Self-pay | Admitting: Internal Medicine

## 2017-01-25 LAB — PROTIME-INR
INR: 1.6 — ABNORMAL HIGH (ref 0.8–1.2)
Prothrombin Time: 16 s — ABNORMAL HIGH (ref 9.1–12.0)

## 2017-01-25 LAB — BASIC METABOLIC PANEL
BUN/Creatinine Ratio: 17 (ref 12–28)
BUN: 16 mg/dL (ref 8–27)
CO2: 20 mmol/L (ref 20–29)
Calcium: 9.8 mg/dL (ref 8.7–10.3)
Chloride: 103 mmol/L (ref 96–106)
Creatinine, Ser: 0.94 mg/dL (ref 0.57–1.00)
GFR calc Af Amer: 64 mL/min/{1.73_m2} (ref >59)
GFR calc non Af Amer: 55 mL/min/{1.73_m2} — ABNORMAL LOW (ref >59)
Glucose: 87 mg/dL (ref 65–99)
Potassium: 4.3 mmol/L (ref 3.5–5.2)
Sodium: 145 mmol/L — ABNORMAL HIGH (ref 134–144)

## 2017-01-25 LAB — CBC
Hematocrit: 42.4 % (ref 34.0–46.6)
Hemoglobin: 14.4 g/dL (ref 11.1–15.9)
MCH: 30.6 pg (ref 26.6–33.0)
MCHC: 34 g/dL (ref 31.5–35.7)
MCV: 90 fL (ref 79–97)
Platelets: 148 10*3/uL — ABNORMAL LOW (ref 150–379)
RBC: 4.7 x10E6/uL (ref 3.77–5.28)
RDW: 14.4 % (ref 12.3–15.4)
WBC: 6.8 10*3/uL (ref 3.4–10.8)

## 2017-01-25 NOTE — Telephone Encounter (Signed)
I left a message at the patient's home # and cell # to please call me back with how she is feeling to day. Need to know if she needs her PPM gen change next week or she feels good enough to wait until Dr. Caryl Comes is back based on the change he made yesterday.

## 2017-01-25 NOTE — Telephone Encounter (Signed)
I spoke with the patient- she states she feels "great" today.  She wants to wait for Dr. Caryl Comes to do her PPM generator change on 02/25/17.  I have advised her I will get this scheduled for her and call her back with the details.  She is agreeable.

## 2017-01-25 NOTE — Telephone Encounter (Signed)
The patient is scheduled for her PPM generator change on 02/25/17 with Dr. Caryl Comes. She is aware to arrive at 9:30 am at the [facility] after midnight - regular medications the morning of the procedure - do no need to hold warfarin per Dr. Caryl Comes - use surgical scrub - bring insurance cards and current list of meds to the hospital  The patient verbalizes understanding of all of the above.

## 2017-01-25 NOTE — Telephone Encounter (Signed)
Pt is returning your call

## 2017-02-08 ENCOUNTER — Telehealth: Payer: Self-pay | Admitting: Internal Medicine

## 2017-02-08 NOTE — Telephone Encounter (Signed)
I spoke with the patient- she states she spoke with Trinidad Curet, RN. She said Venida Jarvis was calling her to confirm her procedure date/time.

## 2017-02-08 NOTE — Telephone Encounter (Signed)
New message ° °Pt verbalized that she is returning call for RN °

## 2017-02-08 NOTE — Telephone Encounter (Signed)
Thanked patient for returning my call.  Informed that we were making some scheduling changes for Dr. Caryl Comes on 2/4 but she is not affected and to arrive at 9:30 as previously instructed. Patient verbalized understanding and agreeable to plan.

## 2017-02-14 ENCOUNTER — Other Ambulatory Visit: Payer: Self-pay | Admitting: Pulmonary Disease

## 2017-02-25 ENCOUNTER — Encounter (HOSPITAL_COMMUNITY)
Admission: RE | Disposition: A | Discharge status: Home / Self Care | Payer: Self-pay | Source: Ambulatory Visit | Attending: Internal Medicine

## 2017-02-25 ENCOUNTER — Ambulatory Visit (HOSPITAL_COMMUNITY)
Admission: RE | Admit: 2017-02-25 | Discharge: 2017-02-25 | Disposition: A | Discharge status: Home / Self Care | Payer: PPO | Source: Ambulatory Visit | Attending: Internal Medicine | Admitting: Internal Medicine

## 2017-02-25 DIAGNOSIS — I442 Atrioventricular block, complete: Secondary | ICD-10-CM | POA: Diagnosis not present

## 2017-02-25 DIAGNOSIS — Z7901 Long term (current) use of anticoagulants: Secondary | ICD-10-CM | POA: Insufficient documentation

## 2017-02-25 DIAGNOSIS — Z7951 Long term (current) use of inhaled steroids: Secondary | ICD-10-CM | POA: Insufficient documentation

## 2017-02-25 DIAGNOSIS — E785 Hyperlipidemia, unspecified: Secondary | ICD-10-CM | POA: Insufficient documentation

## 2017-02-25 DIAGNOSIS — M109 Gout, unspecified: Secondary | ICD-10-CM | POA: Insufficient documentation

## 2017-02-25 DIAGNOSIS — I4891 Unspecified atrial fibrillation: Secondary | ICD-10-CM | POA: Diagnosis present

## 2017-02-25 DIAGNOSIS — N289 Disorder of kidney and ureter, unspecified: Secondary | ICD-10-CM | POA: Diagnosis not present

## 2017-02-25 DIAGNOSIS — Z45010 Encounter for checking and testing of cardiac pacemaker pulse generator [battery]: Secondary | ICD-10-CM | POA: Diagnosis not present

## 2017-02-25 DIAGNOSIS — Z950 Presence of cardiac pacemaker: Secondary | ICD-10-CM | POA: Diagnosis present

## 2017-02-25 DIAGNOSIS — I739 Peripheral vascular disease, unspecified: Secondary | ICD-10-CM | POA: Diagnosis not present

## 2017-02-25 DIAGNOSIS — I10 Essential (primary) hypertension: Secondary | ICD-10-CM | POA: Diagnosis not present

## 2017-02-25 DIAGNOSIS — Z885 Allergy status to narcotic agent status: Secondary | ICD-10-CM | POA: Insufficient documentation

## 2017-02-25 DIAGNOSIS — I481 Persistent atrial fibrillation: Secondary | ICD-10-CM | POA: Insufficient documentation

## 2017-02-25 DIAGNOSIS — Z87891 Personal history of nicotine dependence: Secondary | ICD-10-CM | POA: Diagnosis not present

## 2017-02-25 HISTORY — PX: PPM GENERATOR CHANGEOUT: EP1233

## 2017-02-25 LAB — SURGICAL PCR SCREEN
MRSA, PCR: NEGATIVE
Staphylococcus aureus: NEGATIVE

## 2017-02-25 LAB — BASIC METABOLIC PANEL
Anion gap: 11 (ref 5–15)
BUN: 18 mg/dL (ref 6–20)
CO2: 25 mmol/L (ref 22–32)
Calcium: 9.5 mg/dL (ref 8.9–10.3)
Chloride: 106 mmol/L (ref 101–111)
Creatinine, Ser: 1.09 mg/dL — ABNORMAL HIGH (ref 0.44–1.00)
GFR calc Af Amer: 52 mL/min — ABNORMAL LOW (ref >60)
GFR calc non Af Amer: 45 mL/min — ABNORMAL LOW (ref >60)
Glucose, Bld: 90 mg/dL (ref 65–99)
Potassium: 4 mmol/L (ref 3.5–5.1)
Sodium: 142 mmol/L (ref 135–145)

## 2017-02-25 LAB — CBC
HCT: 43.2 % (ref 36.0–46.0)
Hemoglobin: 14.1 g/dL (ref 12.0–15.0)
MCH: 30.4 pg (ref 26.0–34.0)
MCHC: 32.6 g/dL (ref 30.0–36.0)
MCV: 93.1 fL (ref 78.0–100.0)
Platelets: 140 10*3/uL — ABNORMAL LOW (ref 150–400)
RBC: 4.64 MIL/uL (ref 3.87–5.11)
RDW: 14.8 % (ref 11.5–15.5)
WBC: 6.1 10*3/uL (ref 4.0–10.5)

## 2017-02-25 LAB — PROTIME-INR
INR: 2.18
Prothrombin Time: 24.1 seconds — ABNORMAL HIGH (ref 11.4–15.2)

## 2017-02-25 SURGERY — PPM GENERATOR CHANGEOUT

## 2017-02-25 MED ORDER — SODIUM CHLORIDE 0.9 % IV SOLN: INTRAVENOUS | Status: AC

## 2017-02-25 MED ORDER — SODIUM CHLORIDE 0.9 % IR SOLN
Status: AC
  Filled 2017-02-25: qty 2

## 2017-02-25 MED ORDER — CEFAZOLIN SODIUM-DEXTROSE 2-4 GM/100ML-% IV SOLN
INTRAVENOUS | Status: AC
  Filled 2017-02-25: qty 100

## 2017-02-25 MED ORDER — ONDANSETRON HCL 4 MG/2ML IJ SOLN: 4.0000 mg | Freq: Four times a day (QID) | INTRAMUSCULAR | Status: DC | PRN

## 2017-02-25 MED ORDER — CEFAZOLIN SODIUM-DEXTROSE 2-4 GM/100ML-% IV SOLN
2.0000 g | INTRAVENOUS | Status: AC
  Administered 2017-02-25: 2 g via INTRAVENOUS

## 2017-02-25 MED ORDER — ACETAMINOPHEN 325 MG PO TABS: 325.0000 mg | ORAL_TABLET | ORAL | Status: DC | PRN

## 2017-02-25 MED ORDER — MUPIROCIN 2 % EX OINT
TOPICAL_OINTMENT | CUTANEOUS | Status: AC
  Administered 2017-02-25: 1 via TOPICAL
  Filled 2017-02-25: qty 22

## 2017-02-25 MED ORDER — LIDOCAINE HCL (PF) 1 % IJ SOLN
INTRAMUSCULAR | Status: AC
  Filled 2017-02-25: qty 60

## 2017-02-25 MED ORDER — LIDOCAINE HCL (PF) 1 % IJ SOLN
INTRAMUSCULAR | Status: DC | PRN
  Administered 2017-02-25: 60 mL

## 2017-02-25 MED ORDER — MIDAZOLAM HCL 5 MG/5ML IJ SOLN
INTRAMUSCULAR | Status: AC
  Filled 2017-02-25: qty 5

## 2017-02-25 MED ORDER — SODIUM CHLORIDE 0.9 % IR SOLN
80.0000 mg | Status: AC
  Administered 2017-02-25: 80 mg

## 2017-02-25 MED ORDER — FENTANYL CITRATE (PF) 100 MCG/2ML IJ SOLN
INTRAMUSCULAR | Status: AC
  Filled 2017-02-25: qty 2

## 2017-02-25 MED ORDER — MUPIROCIN 2 % EX OINT
1.0000 | TOPICAL_OINTMENT | Freq: Once | CUTANEOUS | Status: AC
Start: 2017-02-25 — End: 2017-02-25
  Administered 2017-02-25: 1 via TOPICAL

## 2017-02-25 MED ORDER — SODIUM CHLORIDE 0.9 % IV SOLN
INTRAVENOUS | Status: DC
  Administered 2017-02-25: 10:00:00 via INTRAVENOUS

## 2017-02-25 SURGICAL SUPPLY — 6 items
CABLE SURGICAL S-101-97-12 (CABLE) ×2 IMPLANT
HEMOSTAT SURGICEL 2X4 FIBR (HEMOSTASIS) ×2 IMPLANT
PACEMAKER ASSURITY DR-RF (Pacemaker) ×2 IMPLANT
PAD DEFIB LIFELINK (PAD) ×2 IMPLANT
POUCH AIGIS-R ANTIBACT ICD (Mesh General) ×2 IMPLANT
TRAY PACEMAKER INSERTION (PACKS) ×2 IMPLANT

## 2017-02-25 NOTE — H&P (Signed)
Patient Care Team: Leone Haven, MD as PCP - General (Family Medicine) Deboraha Sprang, MD (Cardiology) Minna Merritts, MD (Cardiology)   HPI  Jessica [name] is a 84 y.o. female  Admitted  For pulse generator replacement  Pacer for complete heart block and some afib  This is 3 device  Denies chest pain sob or edema    Device History: Pacemaker  implanted 1980  for CHB Changed out 2009   Date [place]   2009 A 5076   2009 V 1688     We have in the past discussed NOACs. She has elected to stay on warfarin .    Thromboembolic risk factors ( age  -2, HTN-1, TIA/CVA-2, Vasc disease -1, Gender-1) for a CHADSVASc Score of 7    DATE TEST    7/18 Myoview   EF 65 % No ischemia  8//18 Echo    EF 65 % Mod LVH        Date Cr K Hgb  7/17 1.36 4.0   5/18     14.4             Past Medical History:  Diagnosis Date  . AAA (abdominal aortic aneurysm) ([place])   . Abdominal aneurysm without mention of rupture   . Atrioventricular block, complete ([place])   . Cardiac pacemaker in situ   . Gout   . Hernia   . Hyperlipidemia   . Hypertension     Past Surgical History:  Procedure Laterality Date  . ABDOMINAL AORTIC ANEURYSM REPAIR    . HERNIA REPAIR    . ohter     growth on colon surgery  . TONSILLECTOMY      Current Facility-Administered Medications  Medication Dose Route Frequency Provider Last Rate Last Dose  . 0.9 %  sodium chloride infusion   Intravenous Continuous Deboraha Sprang, MD 50 mL/hr at 02/25/17 [phone]    . ceFAZolin (ANCEF) IVPB 2g/100 mL premix  2 g Intravenous On Call Deboraha Sprang, MD      . gentamicin (GARAMYCIN) 80 mg in sodium chloride irrigation 0.9 % 500 mL irrigation  80 mg Irrigation On Call Deboraha Sprang, MD        Allergies  Allergen Reactions  . Codeine Other (See Comments)    Made her feel crazy  . Morphine Other (See Comments)    Made her feel crazy  . Zyrtec [Cetirizine]  Other (See Comments)    Causes facial numbness      Social History   Tobacco Use  . Smoking status: Former Smoker    Packs/day: 1.50    Years: 50.00    Pack years: 75.00    Types: Cigarettes    Last attempt to quit: 10/03/1995    Years since quitting: 21.4  . Smokeless tobacco: Never Used  Substance Use Topics  . Alcohol use: Yes    Alcohol/week: 0.6 oz    Types: 1 Glasses of wine per week    Comment: per week  . Drug use: No     Family History  Problem Relation Age of Onset  . Heart disease Father   . Alcohol abuse Father   . Cancer Brother      Current Meds  Medication Sig  . acetaminophen (TYLENOL) 500 MG tablet Take 500 mg by mouth every 6 (six) hours as needed for moderate pain or headache.  . albuterol (PROAIR HFA) 108 (90  Base) MCG/ACT inhaler Inhale 2 puffs into the lungs every 6 (six) hours as needed for wheezing or shortness of breath.  Marland Kitchen atenolol (TENORMIN) 25 MG tablet TAKE 1 TABLET BY MOUTH EVERY DAY. (Patient taking differently: TAKE 25 MG BY MOUTH EVERY DAY.)  . Cholecalciferol (VITAMIN D3 PO) Take 1 capsule by mouth daily.  . Cyanocobalamin (VITAMIN B-12 PO) Take 1 tablet by mouth daily.  Marland Kitchen SPIRIVA HANDIHALER 18 MCG inhalation capsule INHALE ONE CAPSULE AS DIRECTED ONCE A DAY  . warfarin (COUMADIN) 5 MG tablet TAKE AS DIRECTED BY ANTICOAGULATION CLINIC (Patient taking differently: Take 5-7.5 mg by mouth See admin instructions. Take 7.5 mg by mouth at bedtime on Wednesday. Take 5 mg by mouth at bedtime on all other days)  . [DISCONTINUED] tiotropium (SPIRIVA) 18 MCG inhalation capsule Place 1 capsule (18 mcg total) into inhaler and inhale daily. (Patient taking differently: Place 18 mcg into inhaler and inhale daily as needed (for shortness of breath). )     Review of Systems negative except from HPI and PMH  Physical Exam BP (!) 163/77 (BP Location: Right Arm)   Pulse 73   Temp (!) 97.5 F (36.4 C) (Oral)   Ht 5\' 7"  (1.702 m)   Wt 131 lb (59.4 kg)    SpO2 97%   BMI 20.52 kg/m  Well developed and nourished in no acute distress HENT normal Neck supple with JVP-flat Clear Device pocket well healed; without hematoma or erythema.  There is no tethering  Regular rate and rhythm, no murmurs or gallops Abd-soft with active BS No Clubbing cyanosis edema Skin-warm and dry A & Oriented  Grossly normal sensory and motor function     Assessment and  Plan   Assessment and  Plan  Atrial fibrillation-persistent  Hypertension    AV block-complete -intermittent  Dyspnea on exertion  Grade V3 renal insufficiency  Peripheral vascular disease status post AAA repair  Pacemaker-St. Jude     Device is reached ERI   she has decided to abandon warfarin     We will begin apixoban at 2.5 (84 yo wt is 59 Kg)

## 2017-02-25 NOTE — Discharge Instructions (Signed)
Pacemaker Battery Change, Care After This sheet gives you information about how to care for yourself after your procedure. Your health care provider may also give you more specific instructions. If you have problems or questions, contact your health care provider. What can I expect after the procedure? After your procedure, it is common to have:  Pain or soreness at the site where the pacemaker was inserted.  Swelling at the site where the pacemaker was inserted.  Follow these instructions at home: Incision care  Keep the incision clean and dry. ? Do not take baths, swim, or use a hot tub until your health care provider approves. ? dermabond will come off in next 10-14days; if not will remove at wound check; keep wound dry until tomorrow evening; no driving x 4 days; wound check in office as scheduled ? Pat the area dry with a clean towel. Do not rub the area. This may cause bleeding.  Follow instructions from your health care provider about how to take care of your incision. Make sure you: ? Wash your hands with soap and water before you change your bandage (dressing). If soap and water are not available, use hand sanitizer. ? Change your dressing as told by your health care provider. ? Leave stitches (sutures), skin glue, or adhesive strips in place. These skin closures may need to stay in place for 2 weeks or longer. If adhesive strip edges start to loosen and curl up, you may trim the loose edges. Do not remove adhesive strips completely unless your health care provider tells you to do that.  Check your incision area every day for signs of infection. Check for: ? More redness, swelling, or pain. ? More fluid or blood. ? Warmth. ? Pus or a bad smell. Activity  Do not lift anything that is heavier than 10 lb (4.5 kg) until your health care provider says it is okay to do so.  For the first 2 weeks, or as long as told by your health care provider: ? Avoid lifting your left arm higher  than your shoulder. ? Be gentle when you move your arms over your head. It is okay to raise your arm to comb your hair. ? Avoid strenuous exercise.  Ask your health care provider when it is okay to: ? Resume your normal activities. ? Return to work or school. ? Resume sexual activity. Eating and drinking  Eat a heart-healthy diet. This should include plenty of fresh fruits and vegetables, whole grains, low-fat dairy products, and lean protein like chicken and fish.  Limit alcohol intake to no more than 1 drink a day for non-pregnant women and 2 drinks a day for men. One drink equals 12 oz of beer, 5 oz of wine, or 1 oz of hard liquor.  Check ingredients and nutrition facts on packaged foods and beverages. Avoid the following types of food: ? Food that is high in salt (sodium). ? Food that is high in saturated fat, like full-fat dairy or red meat. ? Food that is high in trans fat, like fried food. ? Food and drinks that are high in sugar. Lifestyle  Do not use any products that contain nicotine or tobacco, such as cigarettes and e-cigarettes. If you need help quitting, ask your health care provider.  Take steps to manage and control your weight.  Get regular exercise. Aim for 150 minutes of moderate-intensity exercise (such as walking or yoga) or 75 minutes of vigorous exercise (such as running or swimming) each week.  Manage other health problems, such as diabetes or high blood pressure. Ask your health care provider how you can manage these conditions. General instructions  Do not drive for 24 hours after your procedure if you were given a medicine to help you relax (sedative).  Take over-the-counter and prescription medicines only as told by your health care provider.  Avoid putting pressure on the area where the pacemaker was placed.  If you need an MRI after your pacemaker has been placed, be sure to tell the health care provider who orders the MRI that you have a  pacemaker.  Avoid close and prolonged exposure to electrical devices that have strong magnetic fields. These include: ? Cell phones. Avoid keeping them in a pocket near the pacemaker, and try using the ear opposite the pacemaker. ? MP3 players. ? Household appliances, like microwaves. ? Metal detectors. ? Electric generators. ? High-tension wires.  Keep all follow-up visits as directed by your health care provider. This is important. Contact a health care provider if:  You have pain at the incision site that is not relieved by over-the-counter or prescription medicines.  You have any of these around your incision site or coming from it: ? More redness, swelling, or pain. ? Fluid or blood. ? Warmth to the touch. ? Pus or a bad smell.  You have a fever.  You feel brief, occasional palpitations, light-headedness, or any symptoms that you think might be related to your heart. Get help right away if:  You experience chest pain that is different from the pain at the pacemaker site.  You develop a red streak that extends above or below the incision site.  You experience shortness of breath.  You have palpitations or an irregular heartbeat.  You have light-headedness that does not go away quickly.  You faint or have dizzy spells.  Your pulse suddenly drops or increases rapidly and does not return to normal.  You begin to gain weight and your legs and ankles swell. Summary  After your procedure, it is common to have pain, soreness, and some swelling where the pacemaker was inserted.  Make sure to keep your incision clean and dry. Follow instructions from your health care provider about how to take care of your incision.  Check your incision every day for signs of infection, such as more pain or swelling, pus or a bad smell, warmth, or leaking fluid and blood.  Avoid strenuous exercise and lifting your left arm higher than your shoulder for 2 weeks, or as long as told by your  health care provider. This information is not intended to replace advice given to you by your health care provider. Make sure you discuss any questions you have with your health care provider. Document Released: 10/29/2012 Document Revised: 12/01/2015 Document Reviewed: 12/01/2015 Elsevier Interactive Patient Education  2017 [employer].

## 2017-02-26 ENCOUNTER — Telehealth: Payer: Self-pay

## 2017-02-26 ENCOUNTER — Encounter (HOSPITAL_COMMUNITY): Payer: Self-pay | Admitting: Internal Medicine

## 2017-02-26 ENCOUNTER — Encounter: Payer: Self-pay | Admitting: Internal Medicine

## 2017-02-26 NOTE — Telephone Encounter (Signed)
Pt needs Eliquis called to [facility]. Pt states Dr. Caryl Comes told her yesterday she was to start on this medication

## 2017-02-26 NOTE — Telephone Encounter (Signed)
Please advise if ok to give pt Rx for Eliquis.  Eliquis isn't on pt's current medication list.

## 2017-02-26 NOTE — Telephone Encounter (Signed)
This encounter was created in error - please disregard.

## 2017-02-26 NOTE — Telephone Encounter (Signed)
S/w Dr Caryl Comes concerning patient and starting Eliquis. Patient needs to be off coumadin for 49 hours and have an INR check to make sure appropriate to start Eliquis.  Patient aware of plan. She is scheduled to see [facility] nurse tomorrow at [place] am.

## 2017-02-27 ENCOUNTER — Ambulatory Visit (INDEPENDENT_AMBULATORY_CARE_PROVIDER_SITE_OTHER): Payer: PPO

## 2017-02-27 DIAGNOSIS — I4891 Unspecified atrial fibrillation: Secondary | ICD-10-CM

## 2017-02-27 DIAGNOSIS — I482 Chronic atrial fibrillation, unspecified: Secondary | ICD-10-CM

## 2017-02-27 DIAGNOSIS — Z5181 Encounter for therapeutic drug level monitoring: Secondary | ICD-10-CM | POA: Diagnosis not present

## 2017-02-27 LAB — POCT INR: INR: 1.3

## 2017-02-27 MED ORDER — APIXABAN 2.5 MG PO TABS: 2.5000 mg | ORAL_TABLET | Freq: Two times a day (BID) | ORAL | 6 refills | Status: DC

## 2017-02-27 NOTE — Patient Instructions (Addendum)
Please STOP coumadin. Please START Eliquis 2.5 mg BID.

## 2017-03-04 ENCOUNTER — Telehealth: Payer: Self-pay | Admitting: Internal Medicine

## 2017-03-04 NOTE — Telephone Encounter (Signed)
S/w patient.  She was started on Eliquis on 02/27/17.  Saturday she started to feel she was not completely emptying her bladder. She states "I'm only going about a tablespoon at a time." Denies pain with urination or flank pain. Denies chest pain, fluttering or dizziness and said she feels much better since having the generator change. Shortness of breath is normal for her due to her COPD. Lives at [facility] facility and they have told her she's not drinking much at meals. Patient confirmed she does not drink any at meals and will "try" to drink about 8 oz of water 3 times a day in her room. Advised patient to stay well hydrated and drink more bottles of water than that each day.   S/w Ryan and he does not see that this is a side effect for Eliquis. Advised her to go ahead and call her PCP at [facility] for evaluation as well.  She verbalized understanding of these instructions.

## 2017-03-04 NOTE — Telephone Encounter (Signed)
Patient recently changed over to eliquis and has not been able to void since Saturday   Patient feels fullness but denies sob    In lobby wants to talk to nurse

## 2017-03-06 ENCOUNTER — Ambulatory Visit (INDEPENDENT_AMBULATORY_CARE_PROVIDER_SITE_OTHER): Payer: PPO | Admitting: Internal Medicine

## 2017-03-06 ENCOUNTER — Ambulatory Visit: Payer: Self-pay

## 2017-03-06 ENCOUNTER — Encounter: Payer: Self-pay | Admitting: Internal Medicine

## 2017-03-06 VITALS — BP 146/98 | HR 71 | Temp 97.4°F | Ht 65.0 in | Wt 133.0 lb

## 2017-03-06 DIAGNOSIS — R300 Dysuria: Secondary | ICD-10-CM

## 2017-03-06 DIAGNOSIS — R350 Frequency of micturition: Secondary | ICD-10-CM

## 2017-03-06 LAB — URINALYSIS, ROUTINE W REFLEX MICROSCOPIC
Ketones, ur: NEGATIVE
Leukocytes, UA: NEGATIVE
Nitrite: NEGATIVE
Specific Gravity, Urine: >=1.03 — AB (ref 1.000–1.030)
Total Protein, Urine: 30 — AB
Urine Glucose: NEGATIVE
Urobilinogen, UA: 0.2 (ref 0.0–1.0)
pH: 6 (ref 5.0–8.0)

## 2017-03-06 NOTE — Patient Instructions (Signed)
We will call you Friday with results   Urinary Tract Infection, Adult A urinary tract infection (UTI) is an infection of any part of the urinary tract. The urinary tract includes the:  Kidneys.  Ureters.  Bladder.  Urethra.  These organs make, store, and get rid of pee (urine) in the body. Follow these instructions at home:  Take over-the-counter and prescription medicines only as told by your doctor.  If you were prescribed an antibiotic medicine, take it as told by your doctor. Do not stop taking the antibiotic even if you start to feel better.  Avoid the following drinks: ? Alcohol. ? Caffeine. ? Tea. ? Carbonated drinks.  Drink enough fluid to keep your pee clear or pale yellow.  Keep all follow-up visits as told by your doctor. This is important.  Make sure to: ? Empty your bladder often and completely. Do not to hold pee for long periods of time. ? Empty your bladder before and after sex. ? Wipe from front to back after a bowel movement if you are female. Use each tissue one time when you wipe. Contact a doctor if:  You have back pain.  You have a fever.  You feel sick to your stomach (nauseous).  You throw up (vomit).  Your symptoms do not get better after 3 days.  Your symptoms go away and then come back. Get help right away if:  You have very bad back pain.  You have very bad lower belly (abdominal) pain.  You are throwing up and cannot keep down any medicines or water. This information is not intended to replace advice given to you by your health care provider. Make sure you discuss any questions you have with your health care provider. Document Released: 06/27/2007 Document Revised: 06/16/2015 Document Reviewed: 11/29/2014 Elsevier Interactive Patient Education  [employer].

## 2017-03-06 NOTE — Progress Notes (Signed)
Chief Complaint  Patient presents with  . Urinary Tract Infection   Follow up  C/o increased urine freq since sat and reduced amt w/o burning. She reports sx's started since started eliquis reviewed side effects no adverse effects eliquis and UTI. Pt reports she does not wipe properly will sample urine today Of note she reports she has mesh in from prior ab hernia surgery    Review of Systems  Constitutional: Negative for fever.  Respiratory: Negative for shortness of breath.   Cardiovascular: Negative for chest pain.  Genitourinary: Positive for frequency. Negative for dysuria.       +hesistancy    Past Medical History:  Diagnosis Date  . AAA (abdominal aortic aneurysm) ([place])   . Abdominal aneurysm without mention of rupture   . Atrioventricular block, complete ([place])   . Cardiac pacemaker in situ   . Gout   . Hernia   . Hyperlipidemia   . Hypertension    Past Surgical History:  Procedure Laterality Date  . ABDOMINAL AORTIC ANEURYSM REPAIR    . HERNIA REPAIR    . ohter     growth on colon surgery  . PPM GENERATOR CHANGEOUT N/A 02/25/2017   Procedure: PPM GENERATOR CHANGEOUT;  Surgeon: Deboraha Sprang, MD;  Location: [facility];  Service: Cardiovascular;  Laterality: N/A;  . TONSILLECTOMY     Family History  Problem Relation Age of Onset  . Heart disease Father   . Alcohol abuse Father   . Cancer Brother    Social History   Socioeconomic History  . Marital status: Widowed    Spouse name: Not on file  . Number of children: 2  . Years of education: Not on file  . Highest education level: Not on file  Social Needs  . Financial resource strain: Not on file  . Food insecurity - worry: Not on file  . Food insecurity - inability: Not on file  . Transportation needs - medical: Not on file  . Transportation needs - non-medical: Not on file  Occupational History  . Occupation: Retired    Fish farm manager: RETIRED  Tobacco Use  . Smoking status: Former Smoker     Packs/day: 1.50    Years: 50.00    Pack years: 75.00    Types: Cigarettes    Last attempt to quit: 10/03/1995    Years since quitting: 21.4  . Smokeless tobacco: Never Used  Substance and Sexual Activity  . Alcohol use: Yes    Alcohol/week: 0.6 oz    Types: 1 Glasses of wine per week    Comment: per week  . Drug use: No  . Sexual activity: No  Other Topics Concern  . Not on file  Social History Narrative   Born in [place]. Lives in [place]. Son lives nearby.      No regular exercise    Current Meds  Medication Sig  . acetaminophen (TYLENOL) 500 MG tablet Take 500 mg by mouth every 6 (six) hours as needed for moderate pain or headache.  . albuterol (PROAIR HFA) 108 (90 Base) MCG/ACT inhaler Inhale 2 puffs into the lungs every 6 (six) hours as needed for wheezing or shortness of breath.  Marland Kitchen apixaban (ELIQUIS) 2.5 MG TABS tablet Take 1 tablet (2.5 mg total) by mouth 2 (two) times daily.  Marland Kitchen atenolol (TENORMIN) 25 MG tablet TAKE 1 TABLET BY MOUTH EVERY DAY. (Patient taking differently: TAKE 25 MG BY MOUTH EVERY DAY.)  . Cholecalciferol (VITAMIN D3 PO) Take 1 capsule  by mouth daily.  . Cyanocobalamin (VITAMIN B-12 PO) Take 1 tablet by mouth daily.  Marland Kitchen SPIRIVA HANDIHALER 18 MCG inhalation capsule INHALE ONE CAPSULE AS DIRECTED ONCE A DAY   Allergies  Allergen Reactions  . Codeine Other (See Comments)    Made her feel crazy  . Morphine Other (See Comments)    Made her feel crazy  . Zyrtec [Cetirizine] Other (See Comments)    Causes facial numbness   Recent Results (from the past 2160 hour(s))  CUP PACEART INCLINIC DEVICE CHECK     Status: None   Collection Time: 12/11/16  7:53 PM  Result Value Ref Range   Date Time Interrogation Session [id number]    Pulse Generator Manufacturer [employer]    Pulse Gen Model 5826 Zephyr XL DR    Pulse Gen Serial Number [id number]    Clinic Name [facility]    Implantable Pulse Generator Type Implantable Pulse Generator    Implantable Pulse Generator  Implant Date [id number]    Implantable Lead Manufacturer [facility]    Implantable Lead Model 5076 CapSureFix Novus    Implantable Lead Serial Number [id number]    Implantable Lead Implant Date [id number]    Implantable Lead Location Detail 1 APPENDAGE    Implantable Lead Location [id number]    Implantable Lead Manufacturer [facility]    Implantable Lead Model 1688TC Tendril SDX    Implantable Lead Serial Number [id number]    Implantable Lead Implant Date [id number]    Implantable Lead Location Detail 1 APEX    Implantable Lead Location [id number]    Lead Channel Setting Sensing Sensitivity 2.0 mV   Lead Channel Setting Pacing Amplitude 2.00 V   Lead Channel Setting Pacing Pulse Width 0.5 ms   Lead Channel Setting Pacing Amplitude 2.50 V   Lead Channel Impedance Value 388 ohm   Lead Channel Sensing Intrinsic Amplitude 0.6 mV   Lead Channel Impedance Value 481 ohm   Lead Channel Sensing Intrinsic Amplitude 12.0 mV   Lead Channel Pacing Threshold Amplitude 0.75 V   Lead Channel Pacing Threshold Pulse Width 0.5 ms   Battery Status Unknown    Battery Remaining Longevity 3 mo   Battery Voltage 2.58 V   Battery Impedance 22,900.0 ohm   Brady Statistic RA Percent Paced 11 %   Brady Statistic RV Percent Paced 99 %   Eval Rhythm AF/Vs 46-52   POCT INR     Status: None   Collection Time: 01/16/17 12:56 PM  Result Value Ref Range   INR 2.9   Basic Metabolic Panel (BMET)     Status: Abnormal   Collection Time: 01/24/17 12:48 PM  Result Value Ref Range   Glucose 87 65 - 99 mg/dL   BUN 16 8 - 27 mg/dL   Creatinine, Ser 0.94 0.57 - 1.00 mg/dL   GFR calc non Af Amer 55 (L) >59 mL/min/1.73   GFR calc Af Amer 64 >59 mL/min/1.73   BUN/Creatinine Ratio 17 12 - 28   Sodium 145 (H) 134 - 144 mmol/L   Potassium 4.3 3.5 - 5.2 mmol/L   Chloride 103 96 - 106 mmol/L   CO2 20 20 - 29 mmol/L   Calcium 9.8 8.7 - 10.3 mg/dL  CBC     Status: Abnormal   Collection Time: 01/24/17 12:48 PM  Result Value Ref Range   WBC 6.8 3.4  - 10.8 x10E3/uL   RBC 4.70 3.77 - 5.28 x10E6/uL   Hemoglobin 14.4 11.1 - 15.9 g/dL   Hematocrit 42.4 34.0 - 46.6 %  MCV 90 79 - 97 fL   MCH 30.6 26.6 - 33.0 pg   MCHC 34.0 31.5 - 35.7 g/dL   RDW 14.4 12.3 - 15.4 %   Platelets 148 (L) 150 - 379 x10E3/uL  INR/PT     Status: Abnormal   Collection Time: 01/24/17 12:48 PM  Result Value Ref Range   INR 1.6 (H) 0.8 - 1.2    Comment: Reference interval is for non-anticoagulated patients. Suggested INR therapeutic range for Vitamin K antagonist therapy:    Standard Dose (moderate intensity                   therapeutic range):       2.0 - 3.0    Higher intensity therapeutic range       2.5 - 3.5    Prothrombin Time 16.0 (H) 9.1 - 12.0 sec  Surgical pcr screen     Status: None   Collection Time: 02/25/17  9:47 AM  Result Value Ref Range   MRSA, PCR NEGATIVE NEGATIVE   Staphylococcus aureus NEGATIVE NEGATIVE    Comment: (NOTE) The Xpert SA Assay (FDA approved for NASAL specimens in patients 76 years of age and older), is one component of a comprehensive surveillance program. It is not intended to diagnose infection nor to guide or monitor treatment. Performed at [facility], [place] [address], [place], [place] [address]   CBC     Status: Abnormal   Collection Time: 02/25/17  9:47 AM  Result Value Ref Range   WBC 6.1 4.0 - 10.5 K/uL   RBC 4.64 3.87 - 5.11 MIL/uL   Hemoglobin 14.1 12.0 - 15.0 g/dL   HCT 43.2 36.0 - 46.0 %   MCV 93.1 78.0 - 100.0 fL   MCH 30.4 26.0 - 34.0 pg   MCHC 32.6 30.0 - 36.0 g/dL   RDW 14.8 11.5 - 15.5 %   Platelets 140 (L) 150 - 400 K/uL    Comment: Performed at [facility], [place] [address] [place], [place] [address]  Basic metabolic panel     Status: Abnormal   Collection Time: 02/25/17  9:47 AM  Result Value Ref Range   Sodium 142 135 - 145 mmol/L   Potassium 4.0 3.5 - 5.1 mmol/L   Chloride 106 101 - 111 mmol/L   CO2 25 22 - 32 mmol/L   Glucose, Bld 90 65 - 99 mg/dL   BUN 18 6 - 20 mg/dL    Creatinine, Ser 1.09 (H) 0.44 - 1.00 mg/dL   Calcium 9.5 8.9 - 10.3 mg/dL   GFR calc non Af Amer 45 (L) >60 mL/min   GFR calc Af Amer 52 (L) >60 mL/min    Comment: (NOTE) The eGFR has been calculated using the CKD EPI equation. This calculation has not been validated in all clinical situations. eGFR's persistently <60 mL/min signify possible Chronic Kidney Disease.    Anion gap 11 5 - 15    Comment: Performed at [facility] [address] [place], [place] [address]  Protime-INR     Status: Abnormal   Collection Time: 02/25/17  9:47 AM  Result Value Ref Range   Prothrombin Time 24.1 (H) 11.4 - 15.2 seconds   INR 2.18     Comment: Performed at [facility] [address], [place], [place] [address]  POCT INR     Status: None   Collection Time: 02/27/17  8:48 AM  Result Value Ref Range  INR 1.3    Objective  Body mass index is 22.13 kg/m. Wt Readings from Last 3 Encounters:  03/06/17 133 lb (60.3 kg)  02/25/17 131 lb (59.4 kg)  01/24/17 136 lb 4 oz (61.8 kg)   Temp Readings from Last 3 Encounters:  03/06/17 (!) 97.4 F (36.3 C) (Oral)  02/25/17 (!) 97.5 F (36.4 C) (Oral)  12/12/16 97.9 F (36.6 C) (Oral)   BP Readings from Last 3 Encounters:  03/06/17 (!) 146/98  02/25/17 (!) 143/98  01/24/17 114/74   Pulse Readings from Last 3 Encounters:  03/06/17 71  02/25/17 68  01/24/17 62   O2 sat room air 93%  Physical Exam  Constitutional: She is oriented to person, place, and time and well-developed, well-nourished, and in no distress.  HENT:  Head: Normocephalic and atraumatic.  Eyes: Conjunctivae are normal. Pupils are equal, round, and reactive to light.  Cardiovascular: Normal rate, regular rhythm and normal heart sounds.  Pulmonary/Chest: Effort normal and breath sounds normal.  Abdominal: Soft. Bowel sounds are normal.  Neurological: She is alert and oriented to person, place, and time. Gait normal. Gait normal.  Skin: Skin is warm and dry.   Psychiatric: Mood, memory, affect and judgment normal.  Nursing note and vitals reviewed.   Assessment   1. C/w UTI given sx's  Plan  1. Hold antibiotics for now pt agreeable with this  UA and culture if + will tx  Instructed proper way to wipe RTC in 2 weeks of less if not better and also w/u other etiology  Provider: Dr. Olivia Mackie McLean-Scocuzza-Internal Medicine

## 2017-03-06 NOTE — Progress Notes (Signed)
Pre visit review using our clinic review tool, if applicable. No additional management support is needed unless otherwise documented below in the visit note. 

## 2017-03-07 LAB — URINE CULTURE
MICRO NUMBER:: 90193215
SPECIMEN QUALITY:: ADEQUATE

## 2017-03-08 ENCOUNTER — Ambulatory Visit (INDEPENDENT_AMBULATORY_CARE_PROVIDER_SITE_OTHER): Payer: PPO | Admitting: *Deleted

## 2017-03-08 DIAGNOSIS — I442 Atrioventricular block, complete: Secondary | ICD-10-CM | POA: Diagnosis not present

## 2017-03-08 LAB — CUP PACEART INCLINIC DEVICE CHECK
Battery Remaining Longevity: 109 mo
Battery Voltage: 3.11 V
Brady Statistic RA Percent Paced: 0 %
Brady Statistic RV Percent Paced: 99.37 %
Date Time Interrogation Session: 20190215120638
Implantable Lead Implant Date: 20090610
Implantable Lead Implant Date: 20090610
Implantable Lead Location: 753859
Implantable Lead Location: 753860
Implantable Lead Model: 5076
Implantable Pulse Generator Implant Date: 20190204
Lead Channel Impedance Value: 387.5 Ohm
Lead Channel Impedance Value: 450 Ohm
Lead Channel Pacing Threshold Amplitude: 0.75 V
Lead Channel Pacing Threshold Amplitude: 0.75 V
Lead Channel Pacing Threshold Pulse Width: 0.5 ms
Lead Channel Pacing Threshold Pulse Width: 0.5 ms
Lead Channel Sensing Intrinsic Amplitude: 0.8 mV
Lead Channel Sensing Intrinsic Amplitude: 12 mV
Lead Channel Setting Pacing Amplitude: 2 V
Lead Channel Setting Pacing Amplitude: 2.5 V
Lead Channel Setting Pacing Pulse Width: 0.5 ms
Lead Channel Setting Sensing Sensitivity: 2 mV
Pulse Gen Model: 2272
Pulse Gen Serial Number: 8991929

## 2017-03-08 NOTE — Progress Notes (Signed)
Wound check appointment. Dermabond removed. Wound without redness or edema. Incision edges approximated, wound well healed. Normal device function. Thresholds, sensing, and impedances consistent with implant measurements. Device programmed at chronic values s/p gen change. Histogram distribution appropriate for patient and level of activity. 2 AMS episodes (100%) + eliquis. No high ventricular rates noted. Patient educated about wound care, arm mobility, lifting restrictions. ROV 5/14 with SK in [place].

## 2017-03-12 ENCOUNTER — Emergency Department
Admission: EM | Admit: 2017-03-12 | Discharge: 2017-03-12 | Disposition: A | Discharge status: Home / Self Care | Payer: PPO | Attending: Emergency Medicine | Admitting: Emergency Medicine

## 2017-03-12 ENCOUNTER — Emergency Department: Payer: PPO

## 2017-03-12 ENCOUNTER — Encounter: Payer: Self-pay | Admitting: Emergency Medicine

## 2017-03-12 DIAGNOSIS — S6992XA Unspecified injury of left wrist, hand and finger(s), initial encounter: Secondary | ICD-10-CM | POA: Diagnosis not present

## 2017-03-12 DIAGNOSIS — S59902A Unspecified injury of left elbow, initial encounter: Secondary | ICD-10-CM | POA: Diagnosis not present

## 2017-03-12 DIAGNOSIS — J449 Chronic obstructive pulmonary disease, unspecified: Secondary | ICD-10-CM | POA: Insufficient documentation

## 2017-03-12 DIAGNOSIS — S51002A Unspecified open wound of left elbow, initial encounter: Secondary | ICD-10-CM | POA: Insufficient documentation

## 2017-03-12 DIAGNOSIS — Y92481 Parking lot as the place of occurrence of the external cause: Secondary | ICD-10-CM | POA: Insufficient documentation

## 2017-03-12 DIAGNOSIS — I10 Essential (primary) hypertension: Secondary | ICD-10-CM | POA: Insufficient documentation

## 2017-03-12 DIAGNOSIS — W010XXA Fall on same level from slipping, tripping and stumbling without subsequent striking against object, initial encounter: Secondary | ICD-10-CM | POA: Diagnosis not present

## 2017-03-12 DIAGNOSIS — R51 Headache: Secondary | ICD-10-CM | POA: Diagnosis not present

## 2017-03-12 DIAGNOSIS — Z950 Presence of cardiac pacemaker: Secondary | ICD-10-CM | POA: Diagnosis not present

## 2017-03-12 DIAGNOSIS — Y999 Unspecified external cause status: Secondary | ICD-10-CM | POA: Diagnosis not present

## 2017-03-12 DIAGNOSIS — S52502A Unspecified fracture of the lower end of left radius, initial encounter for closed fracture: Secondary | ICD-10-CM | POA: Diagnosis not present

## 2017-03-12 DIAGNOSIS — Y9301 Activity, walking, marching and hiking: Secondary | ICD-10-CM | POA: Insufficient documentation

## 2017-03-12 DIAGNOSIS — R102 Pelvic and perineal pain: Secondary | ICD-10-CM | POA: Diagnosis not present

## 2017-03-12 DIAGNOSIS — S79912A Unspecified injury of left hip, initial encounter: Secondary | ICD-10-CM | POA: Diagnosis not present

## 2017-03-12 DIAGNOSIS — M25532 Pain in left wrist: Secondary | ICD-10-CM | POA: Diagnosis not present

## 2017-03-12 DIAGNOSIS — Z79899 Other long term (current) drug therapy: Secondary | ICD-10-CM | POA: Insufficient documentation

## 2017-03-12 DIAGNOSIS — W19XXXA Unspecified fall, initial encounter: Secondary | ICD-10-CM

## 2017-03-12 DIAGNOSIS — Z7901 Long term (current) use of anticoagulants: Secondary | ICD-10-CM | POA: Diagnosis not present

## 2017-03-12 DIAGNOSIS — S41112A Laceration without foreign body of left upper arm, initial encounter: Secondary | ICD-10-CM | POA: Diagnosis not present

## 2017-03-12 DIAGNOSIS — S81812A Laceration without foreign body, left lower leg, initial encounter: Secondary | ICD-10-CM | POA: Diagnosis not present

## 2017-03-12 DIAGNOSIS — Z87891 Personal history of nicotine dependence: Secondary | ICD-10-CM | POA: Insufficient documentation

## 2017-03-12 DIAGNOSIS — S0990XA Unspecified injury of head, initial encounter: Secondary | ICD-10-CM | POA: Diagnosis not present

## 2017-03-12 DIAGNOSIS — S52572A Other intraarticular fracture of lower end of left radius, initial encounter for closed fracture: Secondary | ICD-10-CM | POA: Diagnosis not present

## 2017-03-12 DIAGNOSIS — M25552 Pain in left hip: Secondary | ICD-10-CM | POA: Insufficient documentation

## 2017-03-12 DIAGNOSIS — I714 Abdominal aortic aneurysm, without rupture: Secondary | ICD-10-CM | POA: Diagnosis not present

## 2017-03-12 DIAGNOSIS — S3993XA Unspecified injury of pelvis, initial encounter: Secondary | ICD-10-CM | POA: Diagnosis not present

## 2017-03-12 DIAGNOSIS — T148XXA Other injury of unspecified body region, initial encounter: Secondary | ICD-10-CM

## 2017-03-12 MED ORDER — OXYCODONE-ACETAMINOPHEN 5-325 MG PO TABS: 1.0000 | ORAL_TABLET | ORAL | 0 refills | Status: DC | PRN

## 2017-03-12 MED ORDER — ACETAMINOPHEN 500 MG PO TABS
1000.0000 mg | ORAL_TABLET | Freq: Once | ORAL | Status: AC
  Administered 2017-03-12: 1000 mg via ORAL
  Filled 2017-03-12: qty 2

## 2017-03-12 MED ORDER — OXYCODONE HCL 5 MG PO TABS
5.0000 mg | ORAL_TABLET | Freq: Once | ORAL | Status: AC
  Administered 2017-03-12: 5 mg via ORAL
  Filled 2017-03-12: qty 1

## 2017-03-12 NOTE — ED Notes (Signed)
Pt ambulatory with walker up and down hallway with steady gait noted. Pt able to tolerate walking at this time. No assistance from this RN. Pt states she feels better and is asking to go home at this time.

## 2017-03-12 NOTE — ED Notes (Signed)
Skin tear to left elbow, dressing applied out in triage.

## 2017-03-12 NOTE — ED Provider Notes (Signed)
[facility] Emergency Department Provider Note  ____________________________________________  Time seen: Approximately 4:42 PM  I have reviewed the triage vital signs and the nursing notes.   HISTORY  Chief Complaint Fall   HPI Jessica [name] is a 84 y.o. female who presents for evaluation after mechanical fall. Patient reports that she was walking through a parking lot when she tripped on a cement block fell onto her left side. She denies head trauma or LOC. She is on Eliquis. Patient is complaining of pain in her left wrist, left elbow, and left hip. The pain in the wrist and elbow and mild but she is complaining of moderate dull pain in her R hip worse with weight bearing, constant and non radiating since the fall. No neck pain or back pain, no chest pain or abdominal pain. Patient reports her tetanus shot is up to date.  Past Medical History:  Diagnosis Date  . AAA (abdominal aortic aneurysm) ([place])   . Abdominal aneurysm without mention of rupture   . Atrioventricular block, complete ([place])   . Cardiac pacemaker in situ   . Gout   . Hernia   . Hyperlipidemia   . Hypertension     Patient Active Problem List   Diagnosis Date Noted  . Gout 06/05/2016  . AAA (abdominal aortic aneurysm) without rupture ([place]) 04/18/2016  . Loss of sense of smell 12/30/2015  . Memory difficulties 12/30/2015  . Injury of right lower extremity and cellulitis 11/29/2014  . 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 Renal artery stenosis ([place]) 09/15/2014  . 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 CN (constipation) 09/06/2014  . Change in stool caliber 09/06/2014  . Atherosclerosis of aorto-iliac bypass graft ([place]) 08/17/2014  . Shortness of breath 05/12/2014  . Conjunctival hemorrhage of right eye 08/13/2013  . Medicare annual wellness visit, subsequent 04/22/2013  . Allergic rhinitis 11/26/2012  . COPD (chronic obstructive pulmonary disease) ([place]) 09/27/2011  .  Hyperlipidemia 02/22/2011  . Long term current use of anticoagulant 04/12/2010  . HYPERTENSION, BENIGN 08/25/2009  . CAROTID ARTERY STENOSIS, WITHOUT INFARCTION 08/24/2008  . AV BLOCK, COMPLETE 06/17/2008  . Aneurysm of abdominal vessel ([place]) 06/17/2008  . PACEMAKER-[place] 06/17/2008    Past Surgical History:  Procedure Laterality Date  . ABDOMINAL AORTIC ANEURYSM REPAIR    . HERNIA REPAIR    . ohter     growth on colon surgery  . PPM GENERATOR CHANGEOUT N/A 02/25/2017   Procedure: PPM GENERATOR CHANGEOUT;  Surgeon: Deboraha Sprang, MD;  Location: [facility];  Service: Cardiovascular;  Laterality: N/A;  . TONSILLECTOMY      Prior to Admission medications   Medication Sig Start Date End Date Taking? Authorizing Provider  acetaminophen (TYLENOL) 500 MG tablet Take 500 mg by mouth every 6 (six) hours as needed for moderate pain or headache.    [provider]  albuterol (PROAIR HFA) 108 (90 Base) MCG/ACT inhaler Inhale 2 puffs into the lungs every 6 (six) hours as needed for wheezing or shortness of breath. 01/26/16   Wilhelmina Mcardle, MD  apixaban (ELIQUIS) 2.5 MG TABS tablet Take 1 tablet (2.5 mg total) by mouth 2 (two) times daily. 02/27/17   Deboraha Sprang, MD  atenolol (TENORMIN) 25 MG tablet TAKE 1 TABLET BY MOUTH EVERY DAY. Patient taking differently: TAKE 25 MG BY MOUTH EVERY DAY. 05/30/16   Minna Merritts, MD  Cholecalciferol (VITAMIN D3 PO) Take 1 capsule by mouth daily.  [provider]  Cyanocobalamin (VITAMIN B-12 PO) Take 1 tablet by mouth daily.    [provider]  SPIRIVA HANDIHALER 18 MCG inhalation capsule INHALE ONE CAPSULE AS DIRECTED ONCE A DAY 02/14/17   Wilhelmina Mcardle, MD    Allergies Codeine; Morphine; and Zyrtec [cetirizine]  Family History  Problem Relation Age of Onset  . Heart disease Father   . Alcohol abuse Father   . Cancer Brother     Social History Social History   Tobacco Use  . Smoking status: Former  Smoker    Packs/day: 1.50    Years: 50.00    Pack years: 75.00    Types: Cigarettes    Last attempt to quit: 10/03/1995    Years since quitting: 21.4  . Smokeless tobacco: Never Used  Substance Use Topics  . Alcohol use: Yes    Alcohol/week: 0.6 oz    Types: 1 Glasses of wine per week    Comment: per week  . Drug use: No    Review of Systems Constitutional: Negative for fever. Eyes: Negative for visual changes. ENT: Negative for facial injury or neck injury Cardiovascular: Negative for chest injury. Respiratory: Negative for shortness of breath. Negative for chest wall injury. Gastrointestinal: Negative for abdominal pain or injury. Genitourinary: Negative for dysuria. Musculoskeletal: Negative for back injury, + L elbow, wrist and hip pain Skin: + several skin tears Neurological: Negative for head injury.  ____________________________________________   PHYSICAL EXAM:  VITAL SIGNS: ED Triage Vitals  Enc Vitals Group     BP 03/12/17 1448 (!) 179/77     Pulse Rate 03/12/17 1448 70     Resp 03/12/17 1448 20     Temp --      Temp src --      SpO2 03/12/17 1448 93 %     Weight 03/12/17 1449 133 lb (60.3 kg)     Height --      Head Circumference --      Peak Flow --      Pain Score 03/12/17 1448 4     Pain Loc --      Pain Edu? --      Excl. in [place]? --    Constitutional: Alert and oriented. No acute distress. Does not appear intoxicated. HEENT Head: Normocephalic and atraumatic. Face: No facial bony tenderness. Stable midface Ears: No hemotympanum bilaterally. No Battle sign Eyes: No eye injury. PERRL. No raccoon eyes Nose: Nontender. No epistaxis. No rhinorrhea Mouth/Throat: Mucous membranes are moist. No oropharyngeal blood. No dental injury. Airway patent without stridor. Normal voice. Neck: no C-collar in place. No midline c-spine tenderness.  Cardiovascular: Normal rate, regular rhythm. Normal and symmetric distal pulses are present in all  extremities. Pulmonary/Chest: Chest wall is stable and nontender to palpation/compression. Normal respiratory effort. Breath sounds are normal. No crepitus.  Abdominal: Soft, nontender, non distended. Musculoskeletal: ttp over the lateral proximal femur, unable to bear weight, no obvious deformity, pain with ROM of the L hip. ttp over the dorsal aspect of distal radius at the wrist joint. No snuffbox tenderness or pain with axial load of her thumb or palpation of scaphoid. Normal full range of motion in all other extremities. No deformities. No thoracic or lumbar midline spinal tenderness. Pelvis is stable. Skin: Skin is warm, dry and intact. Several skin tears on the LU and LLE Psychiatric: Speech and behavior are appropriate. Neurological: Normal speech and language. Moves all extremities to command. No gross focal neurologic deficits are appreciated.  Glascow Coma Score: 4 - Opens eyes on own 6 - Follows simple motor commands 5 - Alert and oriented GCS: 15   ____________________________________________   LABS (all labs ordered are listed, but only abnormal results are displayed)  Labs Reviewed - No data to display ____________________________________________  EKG  none ____________________________________________  RADIOLOGY  I have personally reviewed the images performed during this visit and I agree with the Radiologist's read.   Interpretation by Radiologist:  Dg Pelvis 1-2 Views  Result Date: 03/12/2017 CLINICAL DATA:  Pelvic pain after fall. EXAM: PELVIS - 1-2 VIEW COMPARISON:  CT abdomen pelvis dated August 12, 2015. FINDINGS: There is no evidence of pelvic fracture or diastasis. No pelvic bone lesions are seen. Degenerative changes of the lower lumbar spine. Osteopenia. Bowel stable seen in the right lower quadrant. Prior ventral hernia repair. Soft tissues are unremarkable. IMPRESSION: No acute osseous abnormality. Electronically Signed   By: Titus Dubin M.D.   On:  03/12/2017 15:40   Dg Elbow Complete Left  Result Date: 03/12/2017 CLINICAL DATA:  Patient tripped over cement curb injuring left elbow. EXAM: LEFT ELBOW - COMPLETE 3+ VIEW COMPARISON:  None. FINDINGS: There is no evidence of fracture, dislocation, or joint effusion. Minimal enthesopathy along the common extensor tendon origin. There is no evidence of arthropathy or other focal bone abnormality. Assessment of the soft tissues is slightly limited by overlying soft tissue wrap. IMPRESSION: Negative for acute fracture or joint effusion. No joint dislocations. Minimal spurring at the common extensor tendon origin. Electronically Signed   By: Ashley Royalty M.D.   On: 03/12/2017 15:38   Dg Wrist Complete Left  Result Date: 03/12/2017 CLINICAL DATA:  Left wrist pain after fall fall. EXAM: LEFT WRIST - COMPLETE 3+ VIEW COMPARISON:  None. FINDINGS: There is a thin lucency through the ulnar aspect of the distal radius, only seen on the frontal view. No dislocation. Ulnar positive variance. Diffuse osteopenia. Mild first CMC joint osteoarthritis. IMPRESSION: 1. Thin lucency through the ulnar aspect of the distal radius, only seen on the frontal view, possibly representing a nondisplaced intra-articular fracture. Electronically Signed   By: Titus Dubin M.D.   On: 03/12/2017 15:39   Ct Head Wo Contrast  Result Date: 03/12/2017 CLINICAL DATA:  Pain following fall EXAM: CT HEAD WITHOUT CONTRAST TECHNIQUE: Contiguous axial images were obtained from the base of the skull through the vertex without intravenous contrast. COMPARISON:  December 15, 2013 FINDINGS: Brain: There is mild diffuse atrophy, stable. There is no intracranial mass, hemorrhage, extra-axial fluid collection, or midline shift. There is slight small vessel disease in the centra semiovale bilaterally. There is small vessel disease in each extreme capsule. No acute infarct is demonstrable. Vascular: There is no hyperdense vessel. There is calcification in  each carotid siphon region. Skull: Bony calvarium appears intact. Sinuses/Orbits: There is mucosal thickening in several ethmoid air cells bilaterally. Other visualized paranasal sinuses are clear. Visualized orbits appear symmetric bilaterally. Other: Mastoid air cells are clear. There is debris in the left external auditory canal. IMPRESSION: Stable atrophy with mild supratentorial small vessel disease. No acute infarct. No mass or hemorrhage. There are foci of arterial vascular calcification. There is mucosal thickening in several ethmoid air cells. There is probable cerumen left external auditory canal. Electronically Signed   By: Lowella Grip III M.D.   On: 03/12/2017 16:43   Dg Hip Unilat W Or Wo Pelvis 2-3 Views Left  Result Date: 03/12/2017 CLINICAL DATA:  Left hip pain after fall. EXAM:  DG HIP (WITH OR WITHOUT PELVIS) 2-3V LEFT COMPARISON:  Pelvic x-ray from same day. FINDINGS: There is no evidence of hip fracture or dislocation. There is no evidence of arthropathy or other focal bone abnormality. Osteopenia. Soft tissues are unremarkable. IMPRESSION: No acute osseous abnormality. If occult hip fracture is suspected or if the patient is unable to bear weight, MRI is the preferred modality for further evaluation. Electronically Signed   By: Titus Dubin M.D.   On: 03/12/2017 16:33     ____________________________________________   PROCEDURES  Procedure(s) performed: None Procedures Critical Care performed:  None ____________________________________________   INITIAL IMPRESSION / ASSESSMENT AND PLAN / ED COURSE  84 y.o. female who presents for evaluation after mechanical fall on Eliquis. Patient has several skin tears, tetanus shot is up to date, wounds will be cleaned and dressed. She denies head trauma however she is on blood thinners for head CT was done and is negative for intracranial bleed. She has no midline C-spine tenderness. She does have significant pain on her left hip  with no obvious deformity of the left lower extremity. X-ray of the hip is negative for fracture. We'll treat with Tylenol and oxycodone and attempt to ambulate once pain is under control. If patient is unable to ambulate will pursue a CT of the hip to rule out an occult fracture. Patient also complaining of pain on the dorsal aspect of the left distal radius at the wrist joint and the x-rays concerning for a very subtle lucency which could represent a fracture. Patient will be placed on a Velcro splint and will be referred to orthopedics for further evaluation. Care transferred to Dr. Kerman Passey.      As part of my medical decision making, I reviewed the following data within the [facility] notes reviewed and incorporated, Radiograph reviewed , Notes from prior ED visits and [place] Controlled Substance Database    Pertinent labs & imaging results that were available during my care of the patient were reviewed by me and considered in my medical decision making (see chart for details).    ____________________________________________   FINAL CLINICAL IMPRESSION(S) / ED DIAGNOSES  Final diagnoses:  Fall, initial encounter  Multiple skin tears  Closed fracture of distal end of left radius, unspecified fracture morphology, initial encounter  Left hip pain      NEW MEDICATIONS STARTED DURING THIS VISIT:  ED Discharge Orders    None       Note:  This document was prepared using Dragon voice recognition software and may include unintentional dictation errors.    Alfred Levins, [place], MD 03/12/17 [phone]

## 2017-03-12 NOTE — ED Triage Notes (Signed)
Pt tripped over a cement car curb today prior to arrival injuring her left elbow and wrist, also c/o pelvic pain.

## 2017-03-12 NOTE — ED Notes (Signed)
Pt R ankle, L knee, and L elbow cleaned and wrapped.

## 2017-03-12 NOTE — ED Provider Notes (Signed)
-----------------------------------------   5:54 PM on 03/12/2017 -----------------------------------------  Patient continues to have pain especially in the left pelvis with attempted ambulation. Patient uses a walker at home to ambulate. We'll proceed with a CT scan of the pelvis to rule out occult fracture. Patient agreeable to this plan of care.  CT negative for acute fracture.  Patient was able to ambulate well with use of a walker.  Uses a walker at home.  We will discharge patient with a short course of pain medication.  Patient agreeable to plan of care.   Harvest Dark, MD 03/12/17 1932

## 2017-03-12 NOTE — ED Notes (Signed)
Pt in xray

## 2017-03-12 NOTE — ED Notes (Signed)
RN attempted to ambulate with pt. Pt was able to stand from bed without increased distress but when pt put weight on feet to walk RN had to catch pt to prevent a fall. Pt reports she feels the pain increase when attempting to bear weight the patient back in bed safely and MD made aware.

## 2017-03-20 NOTE — Progress Notes (Signed)
Subjective:    Patient ID: Jessica [name], female    DOB: [birth year], 84 y.o.   MRN: [id number]  HPI  Jessica [name] is an 84 year old female who presents today stating she is "not feeling well" with pain in her pelvis. This pain started on 03/12/17 when she had a mechanical fall and was evaluated in the ED. She presented with several skin tears that were dressed and clean. She denied head trauma however a head CT was completed since she takes Eliquis. Head CT was negative for intracranial bleed. Further imaging of pelvis did not reveal an acute osseous abnormality, imaging of left elbow was negative for acute fracture or joint effusion, minimal spurring at the common extensor tendon origin was noted. DG of left wrist did note a thin lucency through the ulnar aspect of the distal radius on frontal view that suggested possible nondisplaced intra-articular fracture. She has been referred to orthopedics and was placed in a velcro splint with treatment for pain including tylenol and oxycodone.   Today, she reports that pain in pelvis has improved with rest but still remains with ambulation. She denies chest pain, SOB, or edema.   She is closely followed by cardiology for complete AV block, chronic atrial fibrillation, HTN, and pacemaker placement. She recently 02/25/17 had a generator replacement.   Findings from imaging below:   Interpretation by Radiologist:  Dg Pelvis 1-2 Views   Result Date: 03/12/2017 CLINICAL DATA:  Pelvic pain after fall. EXAM: PELVIS - 1-2 VIEW COMPARISON:  CT abdomen pelvis dated August 12, 2015. FINDINGS: There is no evidence of pelvic fracture or diastasis. No pelvic bone lesions are seen. Degenerative changes of the lower lumbar spine. Osteopenia. Bowel stable seen in the right lower quadrant. Prior ventral hernia repair. Soft tissues are unremarkable. IMPRESSION: No acute osseous abnormality. Electronically Signed   By: Titus Dubin M.D.   On: 03/12/2017 15:40    Dg Elbow  Complete Left   Result Date: 03/12/2017 CLINICAL DATA:  Patient tripped over cement curb injuring left elbow. EXAM: LEFT ELBOW - COMPLETE 3+ VIEW COMPARISON:  None. FINDINGS: There is no evidence of fracture, dislocation, or joint effusion. Minimal enthesopathy along the common extensor tendon origin. There is no evidence of arthropathy or other focal bone abnormality. Assessment of the soft tissues is slightly limited by overlying soft tissue wrap. IMPRESSION: Negative for acute fracture or joint effusion. No joint dislocations. Minimal spurring at the common extensor tendon origin. Electronically Signed   By: Ashley Royalty M.D.   On: 03/12/2017 15:38    Dg Wrist Complete Left   Result Date: 03/12/2017 CLINICAL DATA:  Left wrist pain after fall fall. EXAM: LEFT WRIST - COMPLETE 3+ VIEW COMPARISON:  None. FINDINGS: There is a thin lucency through the ulnar aspect of the distal radius, only seen on the frontal view. No dislocation. Ulnar positive variance. Diffuse osteopenia. Mild first CMC joint osteoarthritis. IMPRESSION: 1. Thin lucency through the ulnar aspect of the distal radius, only seen on the frontal view, possibly representing a nondisplaced intra-articular fracture. Electronically Signed   By: Titus Dubin M.D.   On: 03/12/2017 15:39    Ct Hreportsead Wo Contrast   Result Date: 03/12/2017 CLINICAL DATA:  Pain following fall EXAM: CT HEAD WITHOUT CONTRAST TECHNIQUE: Contiguous axial images were obtained from the base of the skull through the vertex without intravenous contrast. COMPARISON:  December 15, 2013 FINDINGS: Brain: There is mild diffuse atrophy, stable. There is no intracranial mass, hemorrhage, extra-axial  fluid collection, or midline shift. There is slight small vessel disease in the centra semiovale bilaterally. There is small vessel disease in each extreme capsule. No acute infarct is demonstrable. Vascular: There is no hyperdense vessel. There is calcification in each carotid  siphon region. Skull: Bony calvarium appears intact. Sinuses/Orbits: There is mucosal thickening in several ethmoid air cells bilaterally. Other visualized paranasal sinuses are clear. Visualized orbits appear symmetric bilaterally. Other: Mastoid air cells are clear. There is debris in the left external auditory canal. IMPRESSION: Stable atrophy with mild supratentorial small vessel disease. No acute infarct. No mass or hemorrhage. There are foci of arterial vascular calcification. There is mucosal thickening in several ethmoid air cells. There is probable cerumen left external auditory canal. Electronically Signed   By: Lowella Grip III M.D.   On: 03/12/2017 16:43    Dg Hip Unilat W Or Wo Pelvis 2-3 Views Left   Result Date: 03/12/2017 CLINICAL DATA:  Left hip pain after fall. EXAM: DG HIP (WITH OR WITHOUT PELVIS) 2-3V LEFT COMPARISON:  Pelvic x-ray from same day. FINDINGS: There is no evidence of hip fracture or dislocation. There is no evidence of arthropathy or other focal bone abnormality. Osteopenia. Soft tissues are unremarkable. IMPRESSION: No acute osseous abnormality. If occult hip fracture is suspected or if the patient is unable to bear weight, MRI is the preferred modality for further evaluation. Electronically Signed   By: Titus Dubin M.D.   On: 03/12/2017 16:33     Pain in pelvis remains with walking but resolves with rest or sitting.  Pain is rated as a 7 or 8 with walking and zero with rest. Pain does not radiate. She denies numbness, tingling, weakness, chest pain, palpitations, back pain, SOB, or edema.  She denies fever, chills, sweats, N/V/D, dysuria, or flank pain. Treatment with percocet has provided moderate benefit  Left wrist pain has improved and she states that no limitations to movement are noted. She wears the velcro splint intermittently but does not wear it at all times.   Acetaminophen has not been taken for pain in pelvis or wrist. Percocet has been taken  intermittently which provided moderate benefit. ____________________________________________   Review of Systems  Constitutional: Negative for chills, fatigue and fever.       Ambulates with walker  Respiratory: Negative for cough, shortness of breath and wheezing.   Cardiovascular: Negative for chest pain, palpitations and leg swelling.  Gastrointestinal: Negative for abdominal pain, constipation, diarrhea, nausea and vomiting.  Musculoskeletal: Negative for back pain and joint swelling.       Pain in pelvis  Neurological: Negative for dizziness, weakness, light-headedness and headaches.   Past Medical History:  Diagnosis Date  . AAA (abdominal aortic aneurysm) ([place])   . Abdominal aneurysm without mention of rupture   . Atrioventricular block, complete ([place])   . Cardiac pacemaker in situ   . Gout   . Hernia   . Hyperlipidemia   . Hypertension      Social History   Socioeconomic History  . Marital status: Widowed    Spouse name: Not on file  . Number of children: 2  . Years of education: Not on file  . Highest education level: Not on file  Social Needs  . Financial resource strain: Not on file  . Food insecurity - worry: Not on file  . Food insecurity - inability: Not on file  . Transportation needs - medical: Not on file  . Transportation needs - non-medical: Not on file  Occupational History  . Occupation: Retired    Fish farm manager: RETIRED  Tobacco Use  . Smoking status: Former Smoker    Packs/day: 1.50    Years: 50.00    Pack years: 75.00    Types: Cigarettes    Last attempt to quit: 10/03/1995    Years since quitting: 21.4  . Smokeless tobacco: Never Used  Substance and Sexual Activity  . Alcohol use: Yes    Alcohol/week: 0.6 oz    Types: 1 Glasses of wine per week    Comment: per week  . Drug use: No  . Sexual activity: No  Other Topics Concern  . Not on file  Social History Narrative   Born in [place]. Lives in [place]. Son lives nearby.      No regular  exercise     Past Surgical History:  Procedure Laterality Date  . ABDOMINAL AORTIC ANEURYSM REPAIR    . HERNIA REPAIR    . ohter     growth on colon surgery  . PPM GENERATOR CHANGEOUT N/A 02/25/2017   Procedure: PPM GENERATOR CHANGEOUT;  Surgeon: Deboraha Sprang, MD;  Location: [facility];  Service: Cardiovascular;  Laterality: N/A;  . TONSILLECTOMY      Family History  Problem Relation Age of Onset  . Heart disease Father   . Alcohol abuse Father   . Cancer Brother     Allergies  Allergen Reactions  . Codeine Other (See Comments)    Made her feel crazy  . Morphine Other (See Comments)    Made her feel crazy  . Zyrtec [Cetirizine] Other (See Comments)    Causes facial numbness    Current Outpatient Medications on File Prior to Visit  Medication Sig Dispense Refill  . acetaminophen (TYLENOL) 500 MG tablet Take 500 mg by mouth every 6 (six) hours as needed for moderate pain or headache.    . albuterol (PROAIR HFA) 108 (90 Base) MCG/ACT inhaler Inhale 2 puffs into the lungs every 6 (six) hours as needed for wheezing or shortness of breath. 1 Inhaler 5  . apixaban (ELIQUIS) 2.5 MG TABS tablet Take 1 tablet (2.5 mg total) by mouth 2 (two) times daily. 60 tablet 6  . atenolol (TENORMIN) 25 MG tablet TAKE 1 TABLET BY MOUTH EVERY DAY. (Patient taking differently: TAKE 25 MG BY MOUTH EVERY DAY.) 180 tablet 1  . Cholecalciferol (VITAMIN D3 PO) Take 1 capsule by mouth daily.    Marland Kitchen oxyCODONE-acetaminophen (PERCOCET) 5-325 MG tablet Take 1 tablet by mouth every 4 (four) hours as needed for severe pain. 15 tablet 0  . SPIRIVA HANDIHALER 18 MCG inhalation capsule INHALE ONE CAPSULE AS DIRECTED ONCE A DAY 60 capsule 10  . Cyanocobalamin (VITAMIN B-12 PO) Take 1 tablet by mouth daily.     No current facility-administered medications on file prior to visit.     BP 140/74 (BP Location: Left Arm, Patient Position: Sitting, Cuff Size: Normal)   Pulse 72   Temp 97.6 F (36.4 C) (Oral)    Resp 15   Ht 5\' 5"  (1.651 m)   Wt 132 lb 1.9 oz (59.9 kg)   SpO2 94%   BMI 21.99 kg/m        Objective:   Physical Exam  Constitutional: She is oriented to person, place, and time. She appears well-developed and well-nourished.  Eyes: Pupils are equal, round, and reactive to light. No scleral icterus.  Neck: Neck supple.  Cardiovascular: Normal rate, regular rhythm and intact distal pulses.  Pulmonary/Chest: Effort normal and breath sounds normal. She has no wheezes. She has no rales.  Abdominal: Soft. Bowel sounds are normal. She exhibits no distension. There is no tenderness.  Musculoskeletal:       Right hip: She exhibits normal strength, no tenderness and no bony tenderness.       Left hip: She exhibits normal strength, no tenderness and no bony tenderness.  Able to bear weight and ambulate with walker. No pain noted with movement from sitting to standing position.   Lymphadenopathy:    She has no cervical adenopathy.  Neurological: She is alert and oriented to person, place, and time. She has normal strength. No sensory deficit.  Skin: Skin is warm and dry. No rash noted.  Psychiatric: She has a normal mood and affect. Her behavior is normal. Judgment and thought content normal.       Assessment & Plan:   1. Pain in pelvis Pain is not occurring with rest or sitting but remains with ambulation. CT of pelvis was negative for fracture. CT of hip did not reveal osseous abnormality; patient is able to bear weight and ambulate with a well with a walker. She has not taken medication for pain on a regular basis and this with age may be source of slow resolution after recent mechanical fall. She will start acetaminophen 500 mg every 6 hours and we discussed that this cannot be taken with percocet. She voiced understanding and does not wish to take percocet. We further discussed referral to orthopedics if this continues and she is interested stating she is not "patient" and would like to  explore further evaluation as she will be seeing an ortho provider for her wrist as well.  - AMB referral to orthopedics  2. Left wrist pain Improved; she will be further evaluated by orthopedics.  - AMB referral to orthopedics   Further advised her to follow up for evaluation if pain worsens, does not improve with scheduled acetaminophen dosing, or new symptoms develop. She voiced understanding and agreed with plan.  Delano Metz, FNP-C

## 2017-03-21 ENCOUNTER — Encounter: Payer: Self-pay | Admitting: Family Medicine

## 2017-03-21 ENCOUNTER — Ambulatory Visit (INDEPENDENT_AMBULATORY_CARE_PROVIDER_SITE_OTHER): Payer: PPO | Admitting: Family Medicine

## 2017-03-21 VITALS — BP 140/74 | HR 72 | Temp 97.6°F | Resp 15 | Ht 65.0 in | Wt 132.1 lb

## 2017-03-21 DIAGNOSIS — R102 Pelvic and perineal pain: Secondary | ICD-10-CM | POA: Diagnosis not present

## 2017-03-21 DIAGNOSIS — M25532 Pain in left wrist: Secondary | ICD-10-CM

## 2017-03-21 NOTE — Patient Instructions (Addendum)
Please start acetaminophen 500 mg every 6 hours and this is not to be taken with percocet.   A referral to orthopedics has been placed for you. If you do not receive a call related to this referral in 7 to 10 business days, please contact this office.

## 2017-03-22 ENCOUNTER — Telehealth: Payer: Self-pay

## 2017-03-22 NOTE — Telephone Encounter (Signed)
Copied from [place] [phone]. Topic: Referral - Request >> Mar 22, 2017 12:40 PM Scherrie Gerlach wrote: Reason for CRM: pt got a referral to an orthopedic in [place], but states she cannot drive to [place]. Pt needs referral to a orth md in [place]. Pt states long ago saw a dr Earnestine Leys for her knee.

## 2017-03-25 ENCOUNTER — Telehealth: Payer: Self-pay | Admitting: Family Medicine

## 2017-03-25 NOTE — Telephone Encounter (Signed)
Copied from [place] [id number]. Topic: Referral - Request >> Mar 22, 2017 12:40 PM Scherrie Gerlach wrote: Reason for CRM: pt got a referral to an orthopedic in [place], but states she cannot drive to [place]. Pt needs referral to a orth md in [place]. Pt states long ago saw a dr Earnestine Leys for her knee.  >> Mar 25, 2017  3:49 PM Bea Graff, NT wrote: Pt calling to check status on referral being sent to Dr. Sabra Heck.

## 2017-03-28 ENCOUNTER — Ambulatory Visit (INDEPENDENT_AMBULATORY_CARE_PROVIDER_SITE_OTHER): Payer: PPO

## 2017-03-28 ENCOUNTER — Encounter (INDEPENDENT_AMBULATORY_CARE_PROVIDER_SITE_OTHER): Payer: Self-pay | Admitting: Orthopaedic Surgery

## 2017-03-28 ENCOUNTER — Ambulatory Visit (INDEPENDENT_AMBULATORY_CARE_PROVIDER_SITE_OTHER): Payer: PPO | Admitting: Orthopaedic Surgery

## 2017-03-28 VITALS — BP 164/72 | HR 70 | Resp 16 | Ht 65.0 in | Wt 132.0 lb

## 2017-03-28 DIAGNOSIS — M25552 Pain in left hip: Secondary | ICD-10-CM

## 2017-03-28 MED ORDER — TRAMADOL HCL 50 MG PO TABS: 50.0000 mg | ORAL_TABLET | Freq: Three times a day (TID) | ORAL | 0 refills | Status: DC | PRN

## 2017-03-28 NOTE — Progress Notes (Signed)
Office Visit Note   Patient: Jessica [name]           Date of Birth: 12-11-1929           MRN: [id number] Visit Date: 03/28/2017              Requested by: Leone Haven, MD [address] [place], [place] [address] PCP: Leone Haven, MD   Assessment & Plan: Visit Diagnoses:  1. Pain in left hip     Plan:  #1: Continue with conservative treatment at this time. #2: Prescription for tramadol was given #3: If symptoms worsen and she needs to return earlier than her third 3-week appointment.  Follow-Up Instructions: Return in about 3 weeks (around 04/18/2017).   Orders:  Orders Placed This Encounter  Procedures  . XR HIP UNILAT W OR W/O PELVIS 2-3 VIEWS LEFT   Meds ordered this encounter  Medications  . traMADol (ULTRAM) 50 MG tablet    Sig: Take 1 tablet (50 mg total) by mouth every 8 (eight) hours as needed.    Dispense:  30 tablet    Refill:  0    Order Specific Question:   Supervising Provider    Answer:   Garald Balding [[id number]]      Procedures: No procedures performed   Clinical Data: No additional findings.   Subjective: Chief Complaint  Patient presents with  . Left Hip - Injury    [name] IS [place] FOR POST OP FALL 03-12-17. HAVING LEFT HIP PAIN     HPI  Jessica [name] is a very pleasant 84 year old white female who presents today with continuing pain and discomfort in the left hemipelvis and hip area.  History is that she was walking through a parking lot on March 12, 2017 when she tripped on a cement block fell onto her left side.  At that time she was complaining of pain in her left wrist, left elbow, and left hip.  She had moderate pain in the left hip with weightbearing and it was a constant pain.  She was taken to the emergency room where she underwent tubal x-rays as noted below.  She states now that her wrist pain is certainly improved and is now asymptomatic.  His most concern to the fact that she still continues to have  left hip pain and some left groin pain.  She is on Eliquis followed by cardiology for chronic atrial fibrillation and complete heart block.  She does have a pacemaker.   Review of Systems  Constitutional: Negative for fever and unexpected weight change.  HENT: Negative for ear pain and tinnitus.   Eyes: Negative for pain.  Respiratory: Positive for shortness of breath. Negative for cough.   Cardiovascular: Negative for chest pain, palpitations and leg swelling.  Gastrointestinal: Negative for blood in stool, constipation and diarrhea.  Genitourinary: Negative for dysuria.  Musculoskeletal: Negative for back pain, myalgias and neck pain.  Allergic/Immunologic: Negative for food allergies.  Neurological: Negative for dizziness, weakness, light-headedness, numbness and headaches.  Hematological: Bruises/bleeds easily.  Psychiatric/Behavioral: Positive for sleep disturbance.     Objective: Vital Signs: BP (!) 164/72 (BP Location: Left Arm, Patient Position: Sitting, Cuff Size: Normal)   Pulse 70   Resp 16   Ht 5\' 5"  (1.651 m)   Wt 132 lb (59.9 kg)   BMI 21.97 kg/m   Physical Exam  Constitutional: She is oriented to person, place, and time. She appears well-developed and well-nourished.  HENT:  Head: Normocephalic and atraumatic.  Eyes: EOM are normal. Pupils are equal, round, and reactive to light.  Pulmonary/Chest: Effort normal.  Neurological: She is alert and oriented to person, place, and time.  Skin: Skin is warm and dry.  Psychiatric: She has a normal mood and affect. Her behavior is normal. Judgment and thought content normal.    Ortho Exam  Today she does have some tenderness to palpation over the left iliac crest.  She does also have some pain with internal and external rotation of the left hip at 90 degrees of flexion.  SHe only has about 20-30 degrees of internal and external rotation which does produce some pain.  She sits comfortably at this time.  She does have  tenderness over the greater trochanteric region of the hip also.  She has some difficulty arising from the chair and does have some pain with ambulation with the walker.  To her left wrist she states she has had no problems with that now and she has pretty much her full range of motion.  Specialty Comments:  No specialty comments available.  Imaging: Dg Pelvis 1-2 Views  Result Date: 03/12/2017 CLINICAL DATA:  Pelvic pain after fall. EXAM: PELVIS - 1-2 VIEW COMPARISON:  CT abdomen pelvis dated August 12, 2015. FINDINGS: There is no evidence of pelvic fracture or diastasis. No pelvic bone lesions are seen. Degenerative changes of the lower lumbar spine. Osteopenia. Bowel stable seen in the right lower quadrant. Prior ventral hernia repair. Soft tissues are unremarkable. IMPRESSION: No acute osseous abnormality. Electronically Signed   By: Titus Dubin M.D.   On: 03/12/2017 15:40   Dg Elbow Complete Left  Result Date: 03/12/2017 CLINICAL DATA:  Patient tripped over cement curb injuring left elbow. EXAM: LEFT ELBOW - COMPLETE 3+ VIEW COMPARISON:  None. FINDINGS: There is no evidence of fracture, dislocation, or joint effusion. Minimal enthesopathy along the common extensor tendon origin. There is no evidence of arthropathy or other focal bone abnormality. Assessment of the soft tissues is slightly limited by overlying soft tissue wrap. IMPRESSION: Negative for acute fracture or joint effusion. No joint dislocations. Minimal spurring at the common extensor tendon origin. Electronically Signed   By: Ashley Royalty M.D.   On: 03/12/2017 15:38   Dg Wrist Complete Left  Result Date: 03/12/2017 CLINICAL DATA:  Left wrist pain after fall fall. EXAM: LEFT WRIST - COMPLETE 3+ VIEW COMPARISON:  None. FINDINGS: There is a thin lucency through the ulnar aspect of the distal radius, only seen on the frontal view. No dislocation. Ulnar positive variance. Diffuse osteopenia. Mild first CMC joint osteoarthritis.  IMPRESSION: 1. Thin lucency through the ulnar aspect of the distal radius, only seen on the frontal view, possibly representing a nondisplaced intra-articular fracture. Electronically Signed   By: Titus Dubin M.D.   On: 03/12/2017 15:39   Ct Head Wo Contrast  Result Date: 03/12/2017 CLINICAL DATA:  Pain following fall EXAM: CT HEAD WITHOUT CONTRAST TECHNIQUE: Contiguous axial images were obtained from the base of the skull through the vertex without intravenous contrast. COMPARISON:  December 15, 2013 FINDINGS: Brain: There is mild diffuse atrophy, stable. There is no intracranial mass, hemorrhage, extra-axial fluid collection, or midline shift. There is slight small vessel disease in the centra semiovale bilaterally. There is small vessel disease in each extreme capsule. No acute infarct is demonstrable. Vascular: There is no hyperdense vessel. There is calcification in each carotid siphon region. Skull: Bony calvarium appears intact. Sinuses/Orbits: There is mucosal  thickening in several ethmoid air cells bilaterally. Other visualized paranasal sinuses are clear. Visualized orbits appear symmetric bilaterally. Other: Mastoid air cells are clear. There is debris in the left external auditory canal. IMPRESSION: Stable atrophy with mild supratentorial small vessel disease. No acute infarct. No mass or hemorrhage. There are foci of arterial vascular calcification. There is mucosal thickening in several ethmoid air cells. There is probable cerumen left external auditory canal. Electronically Signed   By: Lowella Grip III M.D.   On: 03/12/2017 16:43   Ct Pelvis Wo Contrast  Result Date: 03/12/2017 CLINICAL DATA:  Pain after tripping over a curb today.  Pelvic pain. EXAM: CT PELVIS WITHOUT CONTRAST TECHNIQUE: Multidetector CT imaging of the pelvis was performed following the standard protocol without intravenous contrast. COMPARISON:  Same day pelvic radiographs FINDINGS: Urinary Tract: No hydroureter  nor ureteral calculi. Unremarkable bladder. Bowel: Sigmoid diverticulosis without acute diverticulitis. No bowel obstruction or inflammation. Large bowel protrusion at the level of the umbilicus secondary to diastatic rectus muscles. Vascular/Lymphatic: Aortoiliac atherosclerosis without aneurysm. Reproductive: Uterus and adnexa are unremarkable for age. Small bilateral fat containing inguinal hernias. Other: Surgical tacks from prior ventral hernia repair. Diastasis of the rectus muscles with interposed nonobstructed large bowel protruding between the diastatic muscles. Musculoskeletal: Lower lumbar degenerative disc disease with vacuum disc phenomenon at L3 through S1 with associated facet arthropathy. No acute fracture of the included lower lumbar spine, sacrum or coccyx. Sacroiliac joints are maintained without diastasis. No fracture of the sacral ala. The iliac bones are maintained bilaterally. Pubic symphysis appears intact. Pubic rami are maintained. Hip joints are intact. No proximal femoral fracture. No joint effusion. No significant soft tissue swelling or hematoma. Slightly buckled appearance of the anterior wall of the acetabulum bilaterally likely developmental given symmetry and lack of overlying soft tissue swelling. IMPRESSION: 1. No acute displaced appearing fracture of the bony pelvis and either hip. 2. No soft tissue hematoma or significant soft tissue swelling. 3. Lower lumbar degenerative disc disease L3 through S1. 4. Sigmoid diverticulosis without acute diverticulitis. 5. Bilateral fat containing inguinal hernias. 6. Status post ventral hernia repair with rectus diastasis and slight protrusion of large bowel between the diastatic rectus muscles at the level of the umbilicus. No bowel obstruction however. Electronically Signed   By: Ashley Royalty M.D.   On: 03/12/2017 18:32   Dg Hip Unilat W Or Wo Pelvis 2-3 Views Left  Result Date: 03/12/2017 CLINICAL DATA:  Left hip pain after fall. EXAM:  DG HIP (WITH OR WITHOUT PELVIS) 2-3V LEFT COMPARISON:  Pelvic x-ray from same day. FINDINGS: There is no evidence of hip fracture or dislocation. There is no evidence of arthropathy or other focal bone abnormality. Osteopenia. Soft tissues are unremarkable. IMPRESSION: No acute osseous abnormality. If occult hip fracture is suspected or if the patient is unable to bear weight, MRI is the preferred modality for further evaluation. Electronically Signed   By: Titus Dubin M.D.   On: 03/12/2017 16:33   Xr Hip Unilat W Or W/o Pelvis 2-3 Views Left  Result Date: 03/28/2017 2 view x-ray AP pelvis and left hip frog-leg reveals no occult fractures can be found.  She does have osteopenia.  Revealing of degenerative disc disease in the lower lumbar spine.  X-rays reviewed by Dr. Durward Fortes also.    PMFS History: Current Outpatient Medications  Medication Sig Dispense Refill  . acetaminophen (TYLENOL) 500 MG tablet Take 500 mg by mouth every 6 (six) hours as needed for moderate pain or  headache.    . albuterol (PROAIR HFA) 108 (90 Base) MCG/ACT inhaler Inhale 2 puffs into the lungs every 6 (six) hours as needed for wheezing or shortness of breath. 1 Inhaler 5  . apixaban (ELIQUIS) 2.5 MG TABS tablet Take 1 tablet (2.5 mg total) by mouth 2 (two) times daily. 60 tablet 6  . atenolol (TENORMIN) 25 MG tablet TAKE 1 TABLET BY MOUTH EVERY DAY. (Patient taking differently: TAKE 25 MG BY MOUTH EVERY DAY.) 180 tablet 1  . Cholecalciferol (VITAMIN D3 PO) Take 1 capsule by mouth daily.    Marland Kitchen SPIRIVA HANDIHALER 18 MCG inhalation capsule INHALE ONE CAPSULE AS DIRECTED ONCE A DAY 60 capsule 10  . Cyanocobalamin (VITAMIN B-12 PO) Take 1 tablet by mouth daily.    Marland Kitchen oxyCODONE-acetaminophen (PERCOCET) 5-325 MG tablet Take 1 tablet by mouth every 4 (four) hours as needed for severe pain. (Patient not taking: Reported on 03/28/2017) 15 tablet 0  . traMADol (ULTRAM) 50 MG tablet Take 1 tablet (50 mg total) by mouth every 8 (eight)  hours as needed. 30 tablet 0   No current facility-administered medications for this visit.     Patient Active Problem List   Diagnosis Date Noted  . Gout 06/05/2016  . AAA (abdominal aortic aneurysm) without rupture ([place]) 04/18/2016  . Loss of sense of smell 12/30/2015  . Memory difficulties 12/30/2015  . Injury of right lower extremity and cellulitis 11/29/2014  . 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 Renal artery stenosis ([place]) 09/15/2014  . 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 CN (constipation) 09/06/2014  . Change in stool caliber 09/06/2014  . Atherosclerosis of aorto-iliac bypass graft ([place]) 08/17/2014  . Shortness of breath 05/12/2014  . Conjunctival hemorrhage of right eye 08/13/2013  . Medicare annual wellness visit, subsequent 04/22/2013  . Allergic rhinitis 11/26/2012  . COPD (chronic obstructive pulmonary disease) ([place]) 09/27/2011  . Hyperlipidemia 02/22/2011  . Long term current use of anticoagulant 04/12/2010  . HYPERTENSION, BENIGN 08/25/2009  . CAROTID ARTERY STENOSIS, WITHOUT INFARCTION 08/24/2008  . AV BLOCK, COMPLETE 06/17/2008  . Aneurysm of abdominal vessel ([place]) 06/17/2008  . PACEMAKER-[place] 06/17/2008   Past Medical History:  Diagnosis Date  . AAA (abdominal aortic aneurysm) ([place])   . Abdominal aneurysm without mention of rupture   . Atrioventricular block, complete ([place])   . Cardiac pacemaker in situ   . Gout   . Hernia   . Hyperlipidemia   . Hypertension     Family History  Problem Relation Age of Onset  . Heart disease Father   . Alcohol abuse Father   . Cancer Brother     Past Surgical History:  Procedure Laterality Date  . ABDOMINAL AORTIC ANEURYSM REPAIR    . HERNIA REPAIR    . ohter     growth on colon surgery  . PPM GENERATOR CHANGEOUT N/A 02/25/2017   Procedure: PPM GENERATOR CHANGEOUT;  Surgeon: Deboraha Sprang, MD;  Location: [facility];  Service: Cardiovascular;  Laterality: N/A;  . TONSILLECTOMY      Social History   Occupational History  . Occupation: Retired    Fish farm manager: RETIRED  Tobacco Use  . Smoking status: Former Smoker    Packs/day: 1.50    Years: 50.00    Pack years: 75.00    Types: Cigarettes    Last attempt to quit: 10/03/1995    Years since quitting: 21.4  . Smokeless tobacco: Never Used  Substance and Sexual Activity  . Alcohol use: Yes  Alcohol/week: 0.6 oz    Types: 1 Glasses of wine per week    Comment: per week  . Drug use: No  . Sexual activity: No

## 2017-03-29 ENCOUNTER — Ambulatory Visit (INDEPENDENT_AMBULATORY_CARE_PROVIDER_SITE_OTHER): Payer: Self-pay | Admitting: Orthopaedic Surgery

## 2017-04-04 ENCOUNTER — Inpatient Hospital Stay
Admission: EM | Admit: 2017-04-04 | Discharge: 2017-04-06 | DRG: 641 | Disposition: A | Discharge status: Skilled Nursing Facility | Payer: PPO | Attending: Internal Medicine | Admitting: Internal Medicine

## 2017-04-04 ENCOUNTER — Encounter: Payer: Self-pay | Admitting: Emergency Medicine

## 2017-04-04 ENCOUNTER — Other Ambulatory Visit: Payer: Self-pay

## 2017-04-04 ENCOUNTER — Emergency Department: Payer: PPO

## 2017-04-04 DIAGNOSIS — R4182 Altered mental status, unspecified: Secondary | ICD-10-CM | POA: Diagnosis not present

## 2017-04-04 DIAGNOSIS — J449 Chronic obstructive pulmonary disease, unspecified: Secondary | ICD-10-CM | POA: Diagnosis not present

## 2017-04-04 DIAGNOSIS — E785 Hyperlipidemia, unspecified: Secondary | ICD-10-CM | POA: Diagnosis present

## 2017-04-04 DIAGNOSIS — G9340 Encephalopathy, unspecified: Secondary | ICD-10-CM | POA: Diagnosis present

## 2017-04-04 DIAGNOSIS — N390 Urinary tract infection, site not specified: Secondary | ICD-10-CM | POA: Diagnosis not present

## 2017-04-04 DIAGNOSIS — Z66 Do not resuscitate: Secondary | ICD-10-CM | POA: Diagnosis present

## 2017-04-04 DIAGNOSIS — I6529 Occlusion and stenosis of unspecified carotid artery: Secondary | ICD-10-CM | POA: Diagnosis not present

## 2017-04-04 DIAGNOSIS — I10 Essential (primary) hypertension: Secondary | ICD-10-CM | POA: Diagnosis not present

## 2017-04-04 DIAGNOSIS — Z7901 Long term (current) use of anticoagulants: Secondary | ICD-10-CM | POA: Diagnosis not present

## 2017-04-04 DIAGNOSIS — I4891 Unspecified atrial fibrillation: Secondary | ICD-10-CM | POA: Diagnosis not present

## 2017-04-04 DIAGNOSIS — E162 Hypoglycemia, unspecified: Principal | ICD-10-CM | POA: Diagnosis present

## 2017-04-04 DIAGNOSIS — Z950 Presence of cardiac pacemaker: Secondary | ICD-10-CM | POA: Diagnosis not present

## 2017-04-04 DIAGNOSIS — N3000 Acute cystitis without hematuria: Secondary | ICD-10-CM | POA: Diagnosis not present

## 2017-04-04 DIAGNOSIS — Z87891 Personal history of nicotine dependence: Secondary | ICD-10-CM

## 2017-04-04 DIAGNOSIS — R41841 Cognitive communication deficit: Secondary | ICD-10-CM | POA: Diagnosis not present

## 2017-04-04 DIAGNOSIS — R410 Disorientation, unspecified: Secondary | ICD-10-CM | POA: Diagnosis not present

## 2017-04-04 DIAGNOSIS — I70399 Other atherosclerosis of unspecified type of bypass graft(s) of the extremities, unspecified extremity: Secondary | ICD-10-CM | POA: Diagnosis not present

## 2017-04-04 DIAGNOSIS — E860 Dehydration: Secondary | ICD-10-CM | POA: Diagnosis present

## 2017-04-04 DIAGNOSIS — R2681 Unsteadiness on feet: Secondary | ICD-10-CM | POA: Diagnosis not present

## 2017-04-04 DIAGNOSIS — I442 Atrioventricular block, complete: Secondary | ICD-10-CM | POA: Diagnosis not present

## 2017-04-04 DIAGNOSIS — I714 Abdominal aortic aneurysm, without rupture: Secondary | ICD-10-CM | POA: Diagnosis not present

## 2017-04-04 DIAGNOSIS — N179 Acute kidney failure, unspecified: Secondary | ICD-10-CM | POA: Diagnosis not present

## 2017-04-04 DIAGNOSIS — Z8249 Family history of ischemic heart disease and other diseases of the circulatory system: Secondary | ICD-10-CM

## 2017-04-04 DIAGNOSIS — N3001 Acute cystitis with hematuria: Secondary | ICD-10-CM | POA: Diagnosis present

## 2017-04-04 DIAGNOSIS — M6281 Muscle weakness (generalized): Secondary | ICD-10-CM | POA: Diagnosis not present

## 2017-04-04 LAB — PROTIME-INR
INR: 0.99
Prothrombin Time: 13 seconds (ref 11.4–15.2)

## 2017-04-04 LAB — URINALYSIS, COMPLETE (UACMP) WITH MICROSCOPIC
Bacteria, UA: NONE SEEN
Bilirubin Urine: NEGATIVE
Glucose, UA: NEGATIVE mg/dL
Ketones, ur: 5 mg/dL — AB
Leukocytes, UA: NEGATIVE
Nitrite: NEGATIVE
Protein, ur: 100 mg/dL — AB
Specific Gravity, Urine: 1.024 (ref 1.005–1.030)
pH: 5 (ref 5.0–8.0)

## 2017-04-04 LAB — COMPREHENSIVE METABOLIC PANEL
ALT: 8 U/L — ABNORMAL LOW (ref 14–54)
AST: 16 U/L (ref 15–41)
Albumin: 4.5 g/dL (ref 3.5–5.0)
Alkaline Phosphatase: 201 U/L — ABNORMAL HIGH (ref 38–126)
Anion gap: 12 (ref 5–15)
BUN: 26 mg/dL — ABNORMAL HIGH (ref 6–20)
CO2: 26 mmol/L (ref 22–32)
Calcium: 9.8 mg/dL (ref 8.9–10.3)
Chloride: 101 mmol/L (ref 101–111)
Creatinine, Ser: 1.6 mg/dL — ABNORMAL HIGH (ref 0.44–1.00)
GFR calc Af Amer: 33 mL/min — ABNORMAL LOW (ref >60)
GFR calc non Af Amer: 28 mL/min — ABNORMAL LOW (ref >60)
Glucose, Bld: 110 mg/dL — ABNORMAL HIGH (ref 65–99)
Potassium: 3.3 mmol/L — ABNORMAL LOW (ref 3.5–5.1)
Sodium: 139 mmol/L (ref 135–145)
Total Bilirubin: 1.2 mg/dL (ref 0.3–1.2)
Total Protein: 8 g/dL (ref 6.5–8.1)

## 2017-04-04 LAB — CBC WITH DIFFERENTIAL/PLATELET
Basophils Absolute: 0.1 10*3/uL (ref 0–0.1)
Basophils Relative: 1 %
Eosinophils Absolute: 0.1 10*3/uL (ref 0–0.7)
Eosinophils Relative: 1 %
HCT: 42.9 % (ref 35.0–47.0)
Hemoglobin: 14.3 g/dL (ref 12.0–16.0)
Lymphocytes Relative: 11 %
Lymphs Abs: 0.8 10*3/uL — ABNORMAL LOW (ref 1.0–3.6)
MCH: 30.1 pg (ref 26.0–34.0)
MCHC: 33.3 g/dL (ref 32.0–36.0)
MCV: 90.4 fL (ref 80.0–100.0)
Monocytes Absolute: 0.7 10*3/uL (ref 0.2–0.9)
Monocytes Relative: 9 %
Neutro Abs: 5.7 10*3/uL (ref 1.4–6.5)
Neutrophils Relative %: 78 %
Platelets: 244 10*3/uL (ref 150–440)
RBC: 4.74 MIL/uL (ref 3.80–5.20)
RDW: 14.4 % (ref 11.5–14.5)
WBC: 7.3 10*3/uL (ref 3.6–11.0)

## 2017-04-04 LAB — GLUCOSE, CAPILLARY
Glucose-Capillary: 106 mg/dL — ABNORMAL HIGH (ref 65–99)
Glucose-Capillary: 109 mg/dL — ABNORMAL HIGH (ref 65–99)
Glucose-Capillary: 113 mg/dL — ABNORMAL HIGH (ref 65–99)
Glucose-Capillary: 135 mg/dL — ABNORMAL HIGH (ref 65–99)
Glucose-Capillary: 139 mg/dL — ABNORMAL HIGH (ref 65–99)
Glucose-Capillary: 91 mg/dL (ref 65–99)

## 2017-04-04 LAB — HEMOGLOBIN A1C
Hgb A1c MFr Bld: 5.2 % (ref 4.8–5.6)
Mean Plasma Glucose: 102.54 mg/dL

## 2017-04-04 LAB — TROPONIN I
Troponin I: 0.04 ng/mL (ref <0.03)
Troponin I: 0.04 ng/mL (ref <0.03)

## 2017-04-04 LAB — MRSA PCR SCREENING: MRSA by PCR: NEGATIVE

## 2017-04-04 LAB — CORTISOL: Cortisol, Plasma: 15 ug/dL

## 2017-04-04 MED ORDER — APIXABAN 2.5 MG PO TABS
2.5000 mg | ORAL_TABLET | Freq: Two times a day (BID) | ORAL | Status: DC
  Administered 2017-04-04 – 2017-04-05 (×4): 2.5 mg via ORAL
  Filled 2017-04-04 (×7): qty 1

## 2017-04-04 MED ORDER — ONDANSETRON HCL 4 MG PO TABS: 4.0000 mg | ORAL_TABLET | Freq: Four times a day (QID) | ORAL | Status: DC | PRN

## 2017-04-04 MED ORDER — HYDRALAZINE HCL 20 MG/ML IJ SOLN
10.0000 mg | Freq: Four times a day (QID) | INTRAMUSCULAR | Status: DC | PRN
  Administered 2017-04-04: 15:00:00 10 mg via INTRAVENOUS
  Filled 2017-04-04: qty 1

## 2017-04-04 MED ORDER — CEFTRIAXONE SODIUM 1 G IJ SOLR
1.0000 g | Freq: Once | INTRAMUSCULAR | Status: AC
  Administered 2017-04-04: 1 g via INTRAVENOUS
  Filled 2017-04-04: qty 10

## 2017-04-04 MED ORDER — ONDANSETRON HCL 4 MG/2ML IJ SOLN: 4.0000 mg | Freq: Four times a day (QID) | INTRAMUSCULAR | Status: DC | PRN

## 2017-04-04 MED ORDER — POLYETHYLENE GLYCOL 3350 17 G PO PACK: 17.0000 g | PACK | Freq: Every day | ORAL | Status: DC | PRN

## 2017-04-04 MED ORDER — SODIUM CHLORIDE 0.9 % IV BOLUS (SEPSIS)
1000.0000 mL | Freq: Once | INTRAVENOUS | Status: AC
  Administered 2017-04-04: 1000 mL via INTRAVENOUS

## 2017-04-04 MED ORDER — TIOTROPIUM BROMIDE MONOHYDRATE 18 MCG IN CAPS
18.0000 ug | ORAL_CAPSULE | Freq: Every day | RESPIRATORY_TRACT | Status: DC
  Administered 2017-04-04 – 2017-04-06 (×3): 18 ug via RESPIRATORY_TRACT
  Filled 2017-04-04: qty 5

## 2017-04-04 MED ORDER — ENOXAPARIN SODIUM 40 MG/0.4ML ~~LOC~~ SOLN: 40.0000 mg | SUBCUTANEOUS | Status: DC

## 2017-04-04 MED ORDER — ALBUTEROL SULFATE HFA 108 (90 BASE) MCG/ACT IN AERS: 2.0000 | INHALATION_SPRAY | Freq: Four times a day (QID) | RESPIRATORY_TRACT | Status: DC | PRN

## 2017-04-04 MED ORDER — TRAMADOL HCL 50 MG PO TABS
50.0000 mg | ORAL_TABLET | Freq: Four times a day (QID) | ORAL | Status: DC | PRN
  Administered 2017-04-05: 50 mg via ORAL
  Filled 2017-04-04: qty 1

## 2017-04-04 MED ORDER — ACETAMINOPHEN 325 MG PO TABS
650.0000 mg | ORAL_TABLET | Freq: Four times a day (QID) | ORAL | Status: DC | PRN
  Administered 2017-04-04: 650 mg via ORAL
  Filled 2017-04-04: qty 2

## 2017-04-04 MED ORDER — ATENOLOL 25 MG PO TABS
25.0000 mg | ORAL_TABLET | Freq: Every day | ORAL | Status: DC
  Administered 2017-04-04 – 2017-04-06 (×3): 25 mg via ORAL
  Filled 2017-04-04 (×4): qty 1

## 2017-04-04 MED ORDER — SODIUM CHLORIDE 0.9 % IV SOLN
1.0000 g | INTRAVENOUS | Status: DC
  Administered 2017-04-05: 1 g via INTRAVENOUS
  Filled 2017-04-04: qty 10

## 2017-04-04 MED ORDER — ALBUTEROL SULFATE (2.5 MG/3ML) 0.083% IN NEBU: 2.5000 mg | INHALATION_SOLUTION | RESPIRATORY_TRACT | Status: DC | PRN

## 2017-04-04 MED ORDER — ACETAMINOPHEN 650 MG RE SUPP: 650.0000 mg | Freq: Four times a day (QID) | RECTAL | Status: DC | PRN

## 2017-04-04 NOTE — ED Notes (Signed)
Pt given breakfast tray at this time, pt's son at bedside to assist patient with eating/drinking. Will continue to monitor for further patient needs.

## 2017-04-04 NOTE — ED Provider Notes (Signed)
[facility] Emergency Department Provider Note  ____________________________________________  Time seen: Approximately 8:54 AM  I have reviewed the triage vital signs and the nursing notes.   HISTORY  Chief Complaint Hypoglycemia and Altered Mental Status  Level 5 caveat:  Portions of the history and physical were unable to be obtained due to AMS   HPI Jessica [name] is a 84 y.o. female with a history of AAA, complete heart block with pacemaker, hypertension, hyperlipidemia, COPD, afib on Eliquis who presents for altered mental status. Patient lives in independent living and was found by home health nurse this morning refuse and altered. EMS was called. Upon arrival sugar was found to be 21. Patient is not on insulin and does not live with anybody who has insulin in the house. Patient received oral glucose in route. Upon arrival patient's glucose is 91. Patient still confused and unable to answer most questions other than yes or no to pain. Denies any pain at this time. according to patient's son patient has not been eating well for the last month since having a fall for which she was seen here. Family is also concerned she may have a UTI.  Past Medical History:  Diagnosis Date  . AAA (abdominal aortic aneurysm) ([place])   . Abdominal aneurysm without mention of rupture   . Atrioventricular block, complete ([place])   . Cardiac pacemaker in situ   . Gout   . Hernia   . Hyperlipidemia   . Hypertension     Patient Active Problem List   Diagnosis Date Noted  . Gout 06/05/2016  . AAA (abdominal aortic aneurysm) without rupture ([place]) 04/18/2016  . Loss of sense of smell 12/30/2015  . Memory difficulties 12/30/2015  . Injury of right lower extremity and cellulitis 11/29/2014  . 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 Renal artery stenosis ([place]) 09/15/2014  . 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 CN (constipation) 09/06/2014  . Change in stool caliber  09/06/2014  . Atherosclerosis of aorto-iliac bypass graft ([place]) 08/17/2014  . Shortness of breath 05/12/2014  . Conjunctival hemorrhage of right eye 08/13/2013  . Medicare annual wellness visit, subsequent 04/22/2013  . Allergic rhinitis 11/26/2012  . COPD (chronic obstructive pulmonary disease) ([place]) 09/27/2011  . Hyperlipidemia 02/22/2011  . Long term current use of anticoagulant 04/12/2010  . HYPERTENSION, BENIGN 08/25/2009  . CAROTID ARTERY STENOSIS, WITHOUT INFARCTION 08/24/2008  . AV BLOCK, COMPLETE 06/17/2008  . Aneurysm of abdominal vessel ([place]) 06/17/2008  . PACEMAKER-[place] 06/17/2008    Past Surgical History:  Procedure Laterality Date  . ABDOMINAL AORTIC ANEURYSM REPAIR    . HERNIA REPAIR    . ohter     growth on colon surgery  . PPM GENERATOR CHANGEOUT N/A 02/25/2017   Procedure: PPM GENERATOR CHANGEOUT;  Surgeon: Deboraha Sprang, MD;  Location: [place] [facility];  Service: Cardiovascular;  Laterality: N/A;  . TONSILLECTOMY      Prior to Admission medications   Medication Sig Start Date End Date Taking? Authorizing Provider  acetaminophen (TYLENOL) 500 MG tablet Take 500 mg by mouth every 6 (six) hours as needed for moderate pain or headache.   Yes [provider]  albuterol (PROAIR HFA) 108 (90 Base) MCG/ACT inhaler Inhale 2 puffs into the lungs every 6 (six) hours as needed for wheezing or shortness of breath. 01/26/16  Yes Wilhelmina Mcardle, MD  apixaban (ELIQUIS) 2.5 MG TABS tablet Take 1 tablet (2.5 mg total) by mouth 2 (two) times daily.  02/27/17  Yes Deboraha Sprang, MD  atenolol (TENORMIN) 25 MG tablet Take 25 mg by mouth daily.   Yes [provider]  Cholecalciferol (VITAMIN D3 PO) Take 1 capsule by mouth daily.   Yes [provider]  Cyanocobalamin (VITAMIN B-12 PO) Take 1 tablet by mouth daily.   Yes [provider]  SPIRIVA HANDIHALER 18 MCG inhalation capsule INHALE ONE CAPSULE AS DIRECTED ONCE A DAY 02/14/17  Yes Wilhelmina Mcardle, MD  traMADol (ULTRAM) 50 MG tablet Take 1 tablet (50 mg total) by mouth every 8 (eight) hours as needed. 03/28/17  Yes Petrarca, Aaron Edelman D, PA-C  atenolol (TENORMIN) 25 MG tablet TAKE 1 TABLET BY MOUTH EVERY DAY. Patient not taking: Reported on 04/04/2017 05/30/16   Minna Merritts, MD  oxyCODONE-acetaminophen (PERCOCET) 5-325 MG tablet Take 1 tablet by mouth every 4 (four) hours as needed for severe pain. Patient not taking: Reported on 03/28/2017 03/12/17   Harvest Dark, MD    Allergies Codeine; Morphine; and Zyrtec [cetirizine]  Family History  Problem Relation Age of Onset  . Heart disease Father   . Alcohol abuse Father   . Cancer Brother     Social History Social History   Tobacco Use  . Smoking status: Former Smoker    Packs/day: 1.50    Years: 50.00    Pack years: 75.00    Types: Cigarettes    Last attempt to quit: 10/03/1995    Years since quitting: 21.5  . Smokeless tobacco: Never Used  Substance Use Topics  . Alcohol use: Yes    Alcohol/week: 0.6 oz    Types: 1 Glasses of wine per week    Comment: per week  . Drug use: No    Review of Systems  Constitutional: + AMS Cardiovascular: Negative for chest pain. Respiratory: Negative for shortness of breath. Gastrointestinal: Negative for abdominal pain, vomiting or diarrhea. Genitourinary: Negative for dysuria. Neurological: Negative for headaches, weakness or numbness.  Level 5 caveat:  Portions of the history and physical were unable to be obtained due to AMS  ____________________________________________   PHYSICAL EXAM:  VITAL SIGNS: ED Triage Vitals [04/04/17 0844]  Enc Vitals Group     BP      Pulse Rate 71     Resp 16     Temp 98 F (36.7 C)     Temp Source Oral     SpO2 98 %     Weight 135 lb (61.2 kg)     Height 5\' 5"  (1.651 m)     Head Circumference      Peak Flow      Pain Score      Pain Loc      Pain Edu?      Excl. in [place]?     Constitutional: Alert and oriented x2,  shivering, looks confused.  HEENT:      Head: Normocephalic and atraumatic.         Eyes: Conjunctivae are normal. Sclera is non-icteric.       Mouth/Throat: Mucous membranes are moist.       Neck: Supple with no signs of meningismus. Cardiovascular: Regular rate and rhythm. No murmurs, gallops, or rubs. 2+ symmetrical distal pulses are present in all extremities. No JVD. Respiratory: Normal respiratory effort. Lungs are clear to auscultation bilaterally. No wheezes, crackles, or rhonchi.  Gastrointestinal: Soft, not tender, non distended with positive bowel sounds. No rebound or guarding. Musculoskeletal: Nontender with normal range of motion in all extremities.  No edema, cyanosis, or erythema of extremities. Neurologic: Slow to answer questions, A&O x2. Face is symmetric. Normal strength x 4.  Skin: Skin is warm, dry and intact. No rash noted. Psychiatric: Mood and affect are normal. Speech and behavior are normal.  ____________________________________________   LABS (all labs ordered are listed, but only abnormal results are displayed)  Labs Reviewed  CBC WITH DIFFERENTIAL/PLATELET - Abnormal; Notable for the following components:      Result Value   Lymphs Abs 0.8 (*)    All other components within normal limits  COMPREHENSIVE METABOLIC PANEL - Abnormal; Notable for the following components:   Potassium 3.3 (*)    Glucose, Bld 110 (*)    BUN 26 (*)    Creatinine, Ser 1.60 (*)    ALT 8 (*)    Alkaline Phosphatase 201 (*)    GFR calc non Af Amer 28 (*)    GFR calc Af Amer 33 (*)    All other components within normal limits  URINALYSIS, COMPLETE (UACMP) WITH MICROSCOPIC - Abnormal; Notable for the following components:   Color, Urine AMBER (*)    APPearance CLOUDY (*)    Hgb urine dipstick LARGE (*)    Ketones, ur 5 (*)    Protein, ur 100 (*)    Squamous Epithelial / LPF 0-5 (*)    All other components within normal limits  TROPONIN I - Abnormal; Notable for the following  components:   Troponin I 0.04 (*)    All other components within normal limits  GLUCOSE, CAPILLARY - Abnormal; Notable for the following components:   Glucose-Capillary 139 (*)    All other components within normal limits  GLUCOSE, CAPILLARY - Abnormal; Notable for the following components:   Glucose-Capillary 113 (*)    All other components within normal limits  URINE CULTURE  GLUCOSE, CAPILLARY  PROTIME-INR  TROPONIN I   ____________________________________________  EKG  ED ECG REPORT I, Rudene Re, the attending physician, personally viewed and interpreted this ECG.  ventricular paced rhythm, rate of 70, normal QTC, right axis deviation,no ST elevations or depressions. Unchanged from prior from 01/19 ____________________________________________  RADIOLOGY  I have personally reviewed the images performed during this visit and I agree with the Radiologist's read.   Interpretation by Radiologist:  Ct Head Wo Contrast  Result Date: 04/04/2017 CLINICAL DATA:  Hypoglycemia.  Confusion. EXAM: CT HEAD WITHOUT CONTRAST TECHNIQUE: Contiguous axial images were obtained from the base of the skull through the vertex without intravenous contrast. COMPARISON:  03/12/2017 FINDINGS: Brain: Generalized atrophy as seen previously. Chronic small-vessel ischemic changes of the white matter. No sign of acute infarction, mass lesion, hemorrhage, hydrocephalus or extra-axial collection. Vascular: There is atherosclerotic calcification of the major vessels at the base of the brain. Skull: Normal Sinuses/Orbits: Clear/normal Other: None IMPRESSION: No acute finding by CT. Atrophy and chronic small-vessel ischemic changes. Electronically Signed   By: Nelson Chimes M.D.   On: 04/04/2017 09:36     ____________________________________________   PROCEDURES  Procedure(s) performed: None Procedures Critical Care performed:  None ____________________________________________   INITIAL IMPRESSION  / ASSESSMENT AND PLAN / ED COURSE  84 y.o. female with a history of AAA, complete heart block with pacemaker, hypertension, hyperlipidemia, COPD, afib on Eliquis who presents for altered mental status and hypoglycemia. patient is not on insulin or any oral glycemic agents. Unclear at this time why she was so hypoglycemic. Repeat blood glucose is 91. Patient still confused but moving all 4 extremities with equal strength and  grip. She is on blood thinners. We'll send patient for head CT to rule out head bleed/stroke. We'll check labs for any signs of infection. We'll closely monitor patient's glucose.    _________________________ 9:06 AM on 04/04/2017 -----------------------------------------  Paient returning to baseline. Feels markedly improve. Able to answer questions. Denies feeling sick recently, no vomiting or diarrhea. Has no complaints at this time. Does not remember what happened prior to arrival.   _________________________ 12:58 PM on 04/04/2017 -----------------------------------------  patient's mental status keeps waxing and waning with moments of clarity and then confusion. Labs consistent with UTI and mild dehydration. Will admit to Hospitalist for further management.  As part of my medical decision making, I reviewed the following data within the [place] History obtained from family, Nursing notes reviewed and incorporated, Labs reviewed , EKG interpreted , Old chart reviewed, Radiograph reviewed , Discussed with admitting physician , Notes from prior ED visits and [place] Controlled Substance Database    Pertinent labs & imaging results that were available during my care of the patient were reviewed by me and considered in my medical decision making (see chart for details).    ____________________________________________   FINAL CLINICAL IMPRESSION(S) / ED DIAGNOSES  Final diagnoses:  Encephalopathy  Hypoglycemia  Acute cystitis with hematuria    Dehydration      NEW MEDICATIONS STARTED DURING THIS VISIT:  ED Discharge Orders    None       Note:  This document was prepared using Dragon voice recognition software and may include unintentional dictation errors.    Alfred Levins, [place], MD 04/04/17 1300

## 2017-04-04 NOTE — ED Triage Notes (Signed)
Pt presents to ED via [facility] with c/o hypoglycemia and altered mental status. Per EMS pt arrives from [facility]. EMS reports initial CBG 21, 1 tube of oral glucose given en route, CBG recheck on arrival to ED was 91. EMS reports on arrival patient was completely disoriented, after receiving tube of oral glucose was was alert and oriented x 2.

## 2017-04-04 NOTE — ED Notes (Signed)
Date and time results received: 04/04/17 1005  Test: Trop Critical Value: 0.04  Name of Provider Notified: Dr. Alfred Levins  Orders Received? Or Actions Taken?: Critical Results Acknowledged

## 2017-04-04 NOTE — Progress Notes (Signed)
Advance care planning  Patient confused at this time.  Discussed with her healthcare power of attorney and son [name]. We discussed regarding patient's UTI, hypoglycemia, treatment plan.  Also her progressive weakness over the past few weeks due to falls.  Physical therapy evaluation. Patient has had good appetite and has not lost any weight.  No problems with her memory.  She has improved with her mental status in the emergency room since her hypoglycemia has been corrected but she is still unable to participate in discussion.  Patient has prior documentation of DO NOT RESUSCITATE and DO NOT INTUBATE.  Son has requested we continue her prior wishes.  Orders entered.  Time spent 20 minutes.

## 2017-04-04 NOTE — H&P (Signed)
[place] [facility] [place] NAME: Jessica [name]    MR#:  [id number]  DATE OF BIRTH:  1929/10/05  DATE OF ADMISSION:  04/04/2017  PRIMARY CARE PHYSICIAN: Leone Haven, MD   REQUESTING/REFERRING PHYSICIAN: Dr. Alfred Levins  CHIEF COMPLAINT:   Chief Complaint  Patient presents with  . Hypoglycemia  . Altered Mental Status    HISTORY OF PRESENT ILLNESS:  Jessica [name]  is a 84 y.o. female with a known history of hypertension, gout presents to the emergency room due to altered mental status.  Initial evaluation by EMS found her blood glucose level to be 21.  Patient was given oral glucose.  And blood sugars were 91 on arrival to emergency room with improved mental status.  Patient is also been found to have UTI.  Has not returned to baseline in spite of blood sugars improving.  She does not take any oral hypoglycemics or insulin.Steroid use. History obtained from reviewing old records and son at bedside.  Patient had a fall 2 weeks back and has had difficulty walking due to pain since then and has been using a walker.  Was independent driving and walking on her own prior to that.  PAST MEDICAL HISTORY:   Past Medical History:  Diagnosis Date  . AAA (abdominal aortic aneurysm) ([place])   . Abdominal aneurysm without mention of rupture   . Atrioventricular block, complete ([place])   . Cardiac pacemaker in situ   . Gout   . Hernia   . Hyperlipidemia   . Hypertension     PAST SURGICAL HISTORY:   Past Surgical History:  Procedure Laterality Date  . ABDOMINAL AORTIC ANEURYSM REPAIR    . HERNIA REPAIR    . ohter     growth on colon surgery  . PPM GENERATOR CHANGEOUT N/A 02/25/2017   Procedure: PPM GENERATOR CHANGEOUT;  Surgeon: Deboraha Sprang, MD;  Location: [facility];  Service: Cardiovascular;  Laterality: N/A;  . TONSILLECTOMY      SOCIAL HISTORY:   Social History   Tobacco Use  . Smoking status: Former Smoker    Packs/day: 1.50     Years: 50.00    Pack years: 75.00    Types: Cigarettes    Last attempt to quit: 10/03/1995    Years since quitting: 21.5  . Smokeless tobacco: Never Used  Substance Use Topics  . Alcohol use: Yes    Alcohol/week: 0.6 oz    Types: 1 Glasses of wine per week    Comment: per week    FAMILY HISTORY:   Family History  Problem Relation Age of Onset  . Heart disease Father   . Alcohol abuse Father   . Cancer Brother     DRUG ALLERGIES:   Allergies  Allergen Reactions  . Codeine Other (See Comments)    Made her feel crazy  . Morphine Other (See Comments)    Made her feel crazy  . Zyrtec [Cetirizine] Other (See Comments)    Causes facial numbness    REVIEW OF SYSTEMS:   Review of Systems  Unable to perform ROS: Mental status change    MEDICATIONS AT HOME:   Prior to Admission medications   Medication Sig Start Date End Date Taking? Authorizing Provider  acetaminophen (TYLENOL) 500 MG tablet Take 500 mg by mouth every 6 (six) hours as needed for moderate pain or headache.   Yes [provider]  albuterol (PROAIR HFA) 108 (90 Base) MCG/ACT inhaler Inhale  2 puffs into the lungs every 6 (six) hours as needed for wheezing or shortness of breath. 01/26/16  Yes Wilhelmina Mcardle, MD  apixaban (ELIQUIS) 2.5 MG TABS tablet Take 1 tablet (2.5 mg total) by mouth 2 (two) times daily. 02/27/17  Yes Deboraha Sprang, MD  atenolol (TENORMIN) 25 MG tablet Take 25 mg by mouth daily.   Yes [provider]  Cholecalciferol (VITAMIN D3 PO) Take 1 capsule by mouth daily.   Yes [provider]  Cyanocobalamin (VITAMIN B-12 PO) Take 1 tablet by mouth daily.   Yes [provider]  SPIRIVA HANDIHALER 18 MCG inhalation capsule INHALE ONE CAPSULE AS DIRECTED ONCE A DAY 02/14/17  Yes Wilhelmina Mcardle, MD  traMADol (ULTRAM) 50 MG tablet Take 1 tablet (50 mg total) by mouth every 8 (eight) hours as needed. 03/28/17  Yes Petrarca, Aaron Edelman D, PA-C  atenolol (TENORMIN) 25 MG tablet  TAKE 1 TABLET BY MOUTH EVERY DAY. Patient not taking: Reported on 04/04/2017 05/30/16   Minna Merritts, MD  oxyCODONE-acetaminophen (PERCOCET) 5-325 MG tablet Take 1 tablet by mouth every 4 (four) hours as needed for severe pain. Patient not taking: Reported on 03/28/2017 03/12/17   Harvest Dark, MD     VITAL SIGNS:  Blood pressure (!) 183/78, pulse 70, temperature 98 F (36.7 C), temperature source Oral, resp. rate (!) 25, height 5\' 5"  (1.651 m), weight 61.2 kg (135 lb), SpO2 98 %.  PHYSICAL EXAMINATION:  Physical Exam  GENERAL:  84 y.o.-year-old patient lying in the bed with no acute distress.  EYES: Pupils equal, round, reactive to light and accommodation. No scleral icterus. Extraocular muscles intact.  HEENT: Head atraumatic, normocephalic. Oropharynx and nasopharynx clear. No oropharyngeal erythema, moist oral mucosa  NECK:  Supple, no jugular venous distention. No thyroid enlargement, no tenderness.  LUNGS: Normal breath sounds bilaterally, no wheezing, rales, rhonchi. No use of accessory muscles of respiration.  CARDIOVASCULAR: S1, S2 normal. No murmurs, rubs, or gallops.  ABDOMEN: Soft, nontender, nondistended. Bowel sounds present. No organomegaly or mass.  EXTREMITIES: No pedal edema, cyanosis, or clubbing. + 2 pedal & radial pulses b/l.   NEUROLOGIC: Cranial nerves II through XII are intact. No focal Motor or sensory deficits appreciated b/l. 4/5 motor strength in lower extremities. PSYCHIATRIC: The patient is alert and awake.  Oriented to person but not place or time. SKIN: No obvious rash, lesion, or ulcer.  Bruising and lower extremities  LABORATORY PANEL:   CBC Recent Labs  Lab 04/04/17 0847  WBC 7.3  HGB 14.3  HCT 42.9  PLT 244   ------------------------------------------------------------------------------------------------------------------  Chemistries  Recent Labs  Lab 04/04/17 0847  NA 139  K 3.3*  CL 101  CO2 26  GLUCOSE 110*  BUN 26*   CREATININE 1.60*  CALCIUM 9.8  AST 16  ALT 8*  ALKPHOS 201*  BILITOT 1.2   ------------------------------------------------------------------------------------------------------------------  Cardiac Enzymes Recent Labs  Lab 04/04/17 1233  TROPONINI 0.04*   ------------------------------------------------------------------------------------------------------------------  RADIOLOGY:  Ct Head Wo Contrast  Result Date: 04/04/2017 CLINICAL DATA:  Hypoglycemia.  Confusion. EXAM: CT HEAD WITHOUT CONTRAST TECHNIQUE: Contiguous axial images were obtained from the base of the skull through the vertex without intravenous contrast. COMPARISON:  03/12/2017 FINDINGS: Brain: Generalized atrophy as seen previously. Chronic small-vessel ischemic changes of the white matter. No sign of acute infarction, mass lesion, hemorrhage, hydrocephalus or extra-axial collection. Vascular: There is atherosclerotic calcification of the major vessels at the base of the brain. Skull: Normal Sinuses/Orbits: Clear/normal Other: None  IMPRESSION: No acute finding by CT. Atrophy and chronic small-vessel ischemic changes. Electronically Signed   By: Nelson Chimes M.D.   On: 04/04/2017 09:36     IMPRESSION AND PLAN:   * Hypoglycemia Etiology unclear.  Could be due to UTI. Check cortisol level, insulin and C-peptide. Check hemoglobin A1c Blood glucose is improved.  Will start D5 if any further hypoglycemia.  *UTI.  Start IV ceftriaxone.  Send urine and blood cultures.  *Hypertension.  Continue home medications.  Added IV hydralazine.  *Generalized weakness.  Progressively weak since a fall a few weeks back.  Will have physical therapy evaluate as patient is in independent care. Will at least need home health at discharge.   All the records are reviewed and case discussed with ED provider. Management plans discussed with the patient, family and they are in agreement.  CODE STATUS: FULL CODE  TOTAL TIME TAKING  CARE OF THIS PATIENT: 35 minutes.   Neita Carp M.D on 04/04/2017 at 1:29 PM  Between 7am to 6pm - Pager - [phone]  After 6pm go to [email or web address] - password [email or web address] [place] [facility]  Office  [phone]  CC: Primary care physician; Leone Haven, MD  Note: This dictation was prepared with Dragon dictation along with smaller phrase technology. Any transcriptional errors that result from this process are unintentional.

## 2017-04-05 DIAGNOSIS — E785 Hyperlipidemia, unspecified: Secondary | ICD-10-CM | POA: Diagnosis present

## 2017-04-05 DIAGNOSIS — E162 Hypoglycemia, unspecified: Secondary | ICD-10-CM | POA: Diagnosis present

## 2017-04-05 DIAGNOSIS — N3001 Acute cystitis with hematuria: Secondary | ICD-10-CM | POA: Diagnosis present

## 2017-04-05 DIAGNOSIS — Z950 Presence of cardiac pacemaker: Secondary | ICD-10-CM | POA: Diagnosis not present

## 2017-04-05 DIAGNOSIS — Z8249 Family history of ischemic heart disease and other diseases of the circulatory system: Secondary | ICD-10-CM | POA: Diagnosis not present

## 2017-04-05 DIAGNOSIS — E860 Dehydration: Secondary | ICD-10-CM | POA: Diagnosis present

## 2017-04-05 DIAGNOSIS — I4891 Unspecified atrial fibrillation: Secondary | ICD-10-CM | POA: Diagnosis present

## 2017-04-05 DIAGNOSIS — Z66 Do not resuscitate: Secondary | ICD-10-CM | POA: Diagnosis present

## 2017-04-05 DIAGNOSIS — G9340 Encephalopathy, unspecified: Secondary | ICD-10-CM | POA: Diagnosis present

## 2017-04-05 DIAGNOSIS — Z7901 Long term (current) use of anticoagulants: Secondary | ICD-10-CM | POA: Diagnosis not present

## 2017-04-05 DIAGNOSIS — N179 Acute kidney failure, unspecified: Secondary | ICD-10-CM | POA: Diagnosis present

## 2017-04-05 DIAGNOSIS — I10 Essential (primary) hypertension: Secondary | ICD-10-CM | POA: Diagnosis present

## 2017-04-05 DIAGNOSIS — Z87891 Personal history of nicotine dependence: Secondary | ICD-10-CM | POA: Diagnosis not present

## 2017-04-05 DIAGNOSIS — J449 Chronic obstructive pulmonary disease, unspecified: Secondary | ICD-10-CM | POA: Diagnosis present

## 2017-04-05 LAB — CBC
HCT: 38.5 % (ref 35.0–47.0)
Hemoglobin: 12.8 g/dL (ref 12.0–16.0)
MCH: 30.4 pg (ref 26.0–34.0)
MCHC: 33.2 g/dL (ref 32.0–36.0)
MCV: 91.5 fL (ref 80.0–100.0)
Platelets: 209 10*3/uL (ref 150–440)
RBC: 4.21 MIL/uL (ref 3.80–5.20)
RDW: 14.7 % — ABNORMAL HIGH (ref 11.5–14.5)
WBC: 6.9 10*3/uL (ref 3.6–11.0)

## 2017-04-05 LAB — INSULIN AND C-PEPTIDE, SERUM
C-Peptide: 4.9 ng/mL — ABNORMAL HIGH (ref 1.1–4.4)
Insulin: 6.4 u[IU]/mL (ref 2.6–24.9)

## 2017-04-05 LAB — BASIC METABOLIC PANEL
Anion gap: 12 (ref 5–15)
BUN: 25 mg/dL — ABNORMAL HIGH (ref 6–20)
CO2: 26 mmol/L (ref 22–32)
Calcium: 9.3 mg/dL (ref 8.9–10.3)
Chloride: 104 mmol/L (ref 101–111)
Creatinine, Ser: 1.58 mg/dL — ABNORMAL HIGH (ref 0.44–1.00)
GFR calc Af Amer: 33 mL/min — ABNORMAL LOW (ref >60)
GFR calc non Af Amer: 29 mL/min — ABNORMAL LOW (ref >60)
Glucose, Bld: 103 mg/dL — ABNORMAL HIGH (ref 65–99)
Potassium: 2.9 mmol/L — ABNORMAL LOW (ref 3.5–5.1)
Sodium: 142 mmol/L (ref 135–145)

## 2017-04-05 LAB — URINE CULTURE: Culture: NO GROWTH

## 2017-04-05 LAB — GLUCOSE, CAPILLARY
Glucose-Capillary: 101 mg/dL — ABNORMAL HIGH (ref 65–99)
Glucose-Capillary: 102 mg/dL — ABNORMAL HIGH (ref 65–99)
Glucose-Capillary: 103 mg/dL — ABNORMAL HIGH (ref 65–99)
Glucose-Capillary: 116 mg/dL — ABNORMAL HIGH (ref 65–99)
Glucose-Capillary: 94 mg/dL (ref 65–99)

## 2017-04-05 LAB — MAGNESIUM: Magnesium: 1.5 mg/dL — ABNORMAL LOW (ref 1.7–2.4)

## 2017-04-05 LAB — CORTISOL: Cortisol, Plasma: 15.6 ug/dL

## 2017-04-05 MED ORDER — POTASSIUM CHLORIDE IN NACL 40-0.9 MEQ/L-% IV SOLN
INTRAVENOUS | Status: AC
Start: 2017-04-05 — End: 2017-04-05
  Administered 2017-04-05: 11:00:00 75 mL/h via INTRAVENOUS
  Filled 2017-04-05: qty 1000

## 2017-04-05 MED ORDER — POTASSIUM CHLORIDE CRYS ER 20 MEQ PO TBCR
40.0000 meq | EXTENDED_RELEASE_TABLET | ORAL | Status: AC
  Administered 2017-04-05 (×2): 40 meq via ORAL
  Filled 2017-04-05 (×2): qty 2

## 2017-04-05 MED ORDER — CEFUROXIME AXETIL 500 MG PO TABS
500.0000 mg | ORAL_TABLET | Freq: Two times a day (BID) | ORAL | Status: DC
  Administered 2017-04-05: 17:00:00 500 mg via ORAL
  Filled 2017-04-05 (×2): qty 1

## 2017-04-05 NOTE — Progress Notes (Signed)
Health Team did approve for SNF at [facility], authorization # [id number]. Per Neoma Laming admissions coordinator at [facility] patient can come to [facility] this weekend. Patient's daughter in law Anderson Malta is aware of above and will provide transport.   [employer], LCSW [phone]

## 2017-04-05 NOTE — Care Management Note (Signed)
Case Management Note  Patient Details  Name: Jessica [name] MRN: [id number] Date of Birth: [birth year]  Subjective/Objective:                  Admitted to [facility] with the diagnosis of hypoglycemia. Lives at [facility], Son is [name] [phone]).Sees Dr. Biagio Quint as primary care physician.  Prescriptions are filled at [facility]. No home Health. No skilled facility. No home oxygen. Takes care of all basic and instrumental activities of daily living herself, drives. Fell 3 weeks ago. Good appetite.   Action/Plan: Will continue to follow for discharge plans   Expected Discharge Date:                  Expected Discharge Plan:     In-House Referral:   yes  Discharge planning Services   yes  Post Acute Care Choice:    Choice offered to:     DME Arranged:    DME Agency:     HH Arranged:    HH Agency:     Status of Service:     If discussed at [employer] of Stay Meetings, dates discussed:    Additional Comments:  Shelbie Ammons, RN MSN CCM Care Management [phone] 04/05/2017, 8:36 AM

## 2017-04-05 NOTE — Progress Notes (Addendum)
[place] at [facility]                                                                                                                                                                                  Patient Demographics   Jessica [name], is a 84 y.o. female, DOB - 1929/05/14, WER:[id number]  Admit date - 04/04/2017   Admitting Physician Hillary Bow, MD  Outpatient Primary MD for the patient is Leone Haven, MD   LOS - 0  Subjective:  Pt admitted with hypoglycemia blood sugars now improved patient denies any history of diabetes states that she has not been eating or drinking well According to nurse has been intermittently confused   Review of Systems:   CONSTITUTIONAL: No documented fever. No fatigue, weakness. No weight gain, no weight loss.  EYES: No blurry or double vision.  ENT: No tinnitus. No postnasal drip. No redness of the oropharynx.  RESPIRATORY: No cough, no wheeze, no hemoptysis. No dyspnea.  CARDIOVASCULAR: No chest pain. No orthopnea. No palpitations. No syncope.  GASTROINTESTINAL: No nausea, no vomiting or diarrhea. No abdominal pain. No melena or hematochezia.  GENITOURINARY: No dysuria or hematuria.  ENDOCRINE: No polyuria or nocturia. No heat or cold intolerance.  HEMATOLOGY: No anemia. No bruising. No bleeding.  INTEGUMENTARY: No rashes. No lesions.  MUSCULOSKELETAL: No arthritis. No swelling. No gout.  NEUROLOGIC: No numbness, tingling, or ataxia. No seizure-type activity.  PSYCHIATRIC: No anxiety. No insomnia. No ADD.    Vitals:   Vitals:   04/04/17 2030 04/05/17 0500 04/05/17 0500 04/05/17 1307  BP: (!) 145/69  (!) 169/70 (!) 146/65  Pulse: 69  70 69  Resp: 16  17   Temp: 98.2 F (36.8 C)  98.4 F (36.9 C) 98.1 F (36.7 C)  TempSrc: Oral  Oral Oral  SpO2: 98%  95% 96%  Weight: 117 lb 1.6 oz (53.1 kg) 120 lb (54.4 kg)    Height:        Wt Readings from Last 3 Encounters:  04/05/17 120 lb (54.4 kg)  03/28/17  132 lb (59.9 kg)  03/21/17 132 lb 1.9 oz (59.9 kg)     Intake/Output Summary (Last 24 hours) at 04/05/2017 1336 Last data filed at 04/05/2017 1029 Gross per 24 hour  Intake 340 ml  Output -  Net 340 ml    Physical Exam:   GENERAL: Pleasant-appearing in no apparent distress.  HEAD, EYES, EARS, NOSE AND THROAT: Atraumatic, normocephalic. Extraocular muscles are intact. Pupils equal and reactive to light. Sclerae anicteric. No conjunctival injection. No oro-pharyngeal erythema.  NECK: Supple. There is no jugular venous distention. No bruits, no lymphadenopathy, no thyromegaly.  HEART:  Regular rate and rhythm,. No murmurs, no rubs, no clicks.  LUNGS: Clear to auscultation bilaterally. No rales or rhonchi. No wheezes.  ABDOMEN: Soft, flat, nontender, nondistended. Has good bowel sounds. No hepatosplenomegaly appreciated.  EXTREMITIES: No evidence of any cyanosis, clubbing, or peripheral edema.  +2 pedal and radial pulses bilaterally.  NEUROLOGIC: The patient is alert, awake, and oriented x3 with no focal motor or sensory deficits appreciated bilaterally.  SKIN: Moist and warm with no rashes appreciated.  Psych: Not anxious, depressed LN: No inguinal LN enlargement    Antibiotics   Anti-infectives (From admission, onward)   Start     Dose/Rate Route Frequency Ordered Stop   04/05/17 1000  cefTRIAXone (ROCEPHIN) 1 g in sodium chloride 0.9 % 100 mL IVPB  Status:  Discontinued     1 g 200 mL/hr over 30 Minutes Intravenous Every 24 hours 04/04/17 1329 04/05/17 1316   04/04/17 0945  cefTRIAXone (ROCEPHIN) 1 g in sodium chloride 0.9 % 100 mL IVPB     1 g 200 mL/hr over 30 Minutes Intravenous  Once 04/04/17 0943 04/04/17 1057      Medications   Scheduled Meds: . apixaban  2.5 mg Oral BID  . atenolol  25 mg Oral Daily  . potassium chloride  40 mEq Oral Q4H  . tiotropium  18 mcg Inhalation Daily   Continuous Infusions: . 0.9 % NaCl with KCl 40 mEq / L 75 mL/hr (04/05/17 1110)   PRN  Meds:.acetaminophen **OR** acetaminophen, albuterol, hydrALAZINE, ondansetron **OR** ondansetron (ZOFRAN) IV, polyethylene glycol, traMADol   Data Review:   Micro Results Recent Results (from the past 240 hour(s))  Urine Culture     Status: None   Collection Time: 04/04/17  8:47 AM  Result Value Ref Range Status   Specimen Description   Final    URINE, RANDOM Performed at [facility], [address], [place], [place] [address]    Special Requests   Final    NONE Performed at [facility], [address] [place], [place] [address]    Culture   Final    NO GROWTH Performed at [facility], [place] [address], [place], [place] [address]    Report Status 04/05/2017 FINAL  Final  CULTURE, BLOOD (ROUTINE X 2) w Reflex to ID Panel     Status: None (Preliminary result)   Collection Time: 04/04/17  1:33 PM  Result Value Ref Range Status   Specimen Description BLOOD BLOOD LEFT WRIST  Final   Special Requests   Final    BOTTLES DRAWN AEROBIC AND ANAEROBIC Blood Culture results may not be optimal due to an excessive volume of blood received in culture bottles   Culture   Final    NO GROWTH < 24 HOURS Performed at [facility], [address], [place], [place] [address]    Report Status PENDING  Incomplete  CULTURE, BLOOD (ROUTINE X 2) w Reflex to ID Panel     Status: None (Preliminary result)   Collection Time: 04/04/17  1:33 PM  Result Value Ref Range Status   Specimen Description BLOOD LEFT ANTECUBITAL  Final   Special Requests   Final    BOTTLES DRAWN AEROBIC AND ANAEROBIC Blood Culture results may not be optimal due to an excessive volume of blood received in culture bottles   Culture   Final    NO GROWTH < 24 HOURS Performed at [facility], [address], [place], [place] [address]    Report  Status PENDING  Incomplete  MRSA PCR Screening     Status: None   Collection Time: 04/04/17  2:49 PM  Result Value Ref Range Status   MRSA by  PCR NEGATIVE NEGATIVE Final    Comment:        The GeneXpert MRSA Assay (FDA approved for NASAL specimens only), is one component of a comprehensive MRSA colonization surveillance program. It is not intended to diagnose MRSA infection nor to guide or monitor treatment for MRSA infections. Performed at [facility], [address], [place], [place] [address]     Radiology Reports Dg Pelvis 1-2 Views  Result Date: 03/12/2017 CLINICAL DATA:  Pelvic pain after fall. EXAM: PELVIS - 1-2 VIEW COMPARISON:  CT abdomen pelvis dated August 12, 2015. FINDINGS: There is no evidence of pelvic fracture or diastasis. No pelvic bone lesions are seen. Degenerative changes of the lower lumbar spine. Osteopenia. Bowel stable seen in the right lower quadrant. Prior ventral hernia repair. Soft tissues are unremarkable. IMPRESSION: No acute osseous abnormality. Electronically Signed   By: Titus Dubin M.D.   On: 03/12/2017 15:40   Dg Elbow Complete Left  Result Date: 03/12/2017 CLINICAL DATA:  Patient tripped over cement curb injuring left elbow. EXAM: LEFT ELBOW - COMPLETE 3+ VIEW COMPARISON:  None. FINDINGS: There is no evidence of fracture, dislocation, or joint effusion. Minimal enthesopathy along the common extensor tendon origin. There is no evidence of arthropathy or other focal bone abnormality. Assessment of the soft tissues is slightly limited by overlying soft tissue wrap. IMPRESSION: Negative for acute fracture or joint effusion. No joint dislocations. Minimal spurring at the common extensor tendon origin. Electronically Signed   By: Ashley Royalty M.D.   On: 03/12/2017 15:38   Dg Wrist Complete Left  Result Date: 03/12/2017 CLINICAL DATA:  Left wrist pain after fall fall. EXAM: LEFT WRIST - COMPLETE 3+ VIEW COMPARISON:  None. FINDINGS: There is a thin lucency through the ulnar aspect of the distal radius, only seen on the frontal view. No dislocation. Ulnar positive variance. Diffuse  osteopenia. Mild first CMC joint osteoarthritis. IMPRESSION: 1. Thin lucency through the ulnar aspect of the distal radius, only seen on the frontal view, possibly representing a nondisplaced intra-articular fracture. Electronically Signed   By: Titus Dubin M.D.   On: 03/12/2017 15:39   Ct Head Wo Contrast  Result Date: 04/04/2017 CLINICAL DATA:  Hypoglycemia.  Confusion. EXAM: CT HEAD WITHOUT CONTRAST TECHNIQUE: Contiguous axial images were obtained from the base of the skull through the vertex without intravenous contrast. COMPARISON:  03/12/2017 FINDINGS: Brain: Generalized atrophy as seen previously. Chronic small-vessel ischemic changes of the white matter. No sign of acute infarction, mass lesion, hemorrhage, hydrocephalus or extra-axial collection. Vascular: There is atherosclerotic calcification of the major vessels at the base of the brain. Skull: Normal Sinuses/Orbits: Clear/normal Other: None IMPRESSION: No acute finding by CT. Atrophy and chronic small-vessel ischemic changes. Electronically Signed   By: Nelson Chimes M.D.   On: 04/04/2017 09:36   Ct Head Wo Contrast  Result Date: 03/12/2017 CLINICAL DATA:  Pain following fall EXAM: CT HEAD WITHOUT CONTRAST TECHNIQUE: Contiguous axial images were obtained from the base of the skull through the vertex without intravenous contrast. COMPARISON:  December 15, 2013 FINDINGS: Brain: There is mild diffuse atrophy, stable. There is no intracranial mass, hemorrhage, extra-axial fluid collection, or midline shift. There is slight small vessel disease in the centra semiovale bilaterally. There is small vessel disease in each extreme capsule. No acute  infarct is demonstrable. Vascular: There is no hyperdense vessel. There is calcification in each carotid siphon region. Skull: Bony calvarium appears intact. Sinuses/Orbits: There is mucosal thickening in several ethmoid air cells bilaterally. Other visualized paranasal sinuses are clear. Visualized orbits  appear symmetric bilaterally. Other: Mastoid air cells are clear. There is debris in the left external auditory canal. IMPRESSION: Stable atrophy with mild supratentorial small vessel disease. No acute infarct. No mass or hemorrhage. There are foci of arterial vascular calcification. There is mucosal thickening in several ethmoid air cells. There is probable cerumen left external auditory canal. Electronically Signed   By: Lowella Grip III M.D.   On: 03/12/2017 16:43   Ct Pelvis Wo Contrast  Result Date: 03/12/2017 CLINICAL DATA:  Pain after tripping over a curb today.  Pelvic pain. EXAM: CT PELVIS WITHOUT CONTRAST TECHNIQUE: Multidetector CT imaging of the pelvis was performed following the standard protocol without intravenous contrast. COMPARISON:  Same day pelvic radiographs FINDINGS: Urinary Tract: No hydroureter nor ureteral calculi. Unremarkable bladder. Bowel: Sigmoid diverticulosis without acute diverticulitis. No bowel obstruction or inflammation. Large bowel protrusion at the level of the umbilicus secondary to diastatic rectus muscles. Vascular/Lymphatic: Aortoiliac atherosclerosis without aneurysm. Reproductive: Uterus and adnexa are unremarkable for age. Small bilateral fat containing inguinal hernias. Other: Surgical tacks from prior ventral hernia repair. Diastasis of the rectus muscles with interposed nonobstructed large bowel protruding between the diastatic muscles. Musculoskeletal: Lower lumbar degenerative disc disease with vacuum disc phenomenon at L3 through S1 with associated facet arthropathy. No acute fracture of the included lower lumbar spine, sacrum or coccyx. Sacroiliac joints are maintained without diastasis. No fracture of the sacral ala. The iliac bones are maintained bilaterally. Pubic symphysis appears intact. Pubic rami are maintained. Hip joints are intact. No proximal femoral fracture. No joint effusion. No significant soft tissue swelling or hematoma. Slightly buckled  appearance of the anterior wall of the acetabulum bilaterally likely developmental given symmetry and lack of overlying soft tissue swelling. IMPRESSION: 1. No acute displaced appearing fracture of the bony pelvis and either hip. 2. No soft tissue hematoma or significant soft tissue swelling. 3. Lower lumbar degenerative disc disease L3 through S1. 4. Sigmoid diverticulosis without acute diverticulitis. 5. Bilateral fat containing inguinal hernias. 6. Status post ventral hernia repair with rectus diastasis and slight protrusion of large bowel between the diastatic rectus muscles at the level of the umbilicus. No bowel obstruction however. Electronically Signed   By: Ashley Royalty M.D.   On: 03/12/2017 18:32   Dg Hip Unilat W Or Wo Pelvis 2-3 Views Left  Result Date: 03/12/2017 CLINICAL DATA:  Left hip pain after fall. EXAM: DG HIP (WITH OR WITHOUT PELVIS) 2-3V LEFT COMPARISON:  Pelvic x-ray from same day. FINDINGS: There is no evidence of hip fracture or dislocation. There is no evidence of arthropathy or other focal bone abnormality. Osteopenia. Soft tissues are unremarkable. IMPRESSION: No acute osseous abnormality. If occult hip fracture is suspected or if the patient is unable to bear weight, MRI is the preferred modality for further evaluation. Electronically Signed   By: Titus Dubin M.D.   On: 03/12/2017 16:33   Xr Hip Unilat W Or W/o Pelvis 2-3 Views Left  Result Date: 03/28/2017 2 view x-ray AP pelvis and left hip frog-leg reveals no occult fractures can be found.  She does have osteopenia.  Revealing of degenerative disc disease in the lower lumbar spine.  X-rays reviewed by Dr. Durward Fortes also.    CBC Recent Labs  Lab 04/04/17  0847 04/05/17 0441  WBC 7.3 6.9  HGB 14.3 12.8  HCT 42.9 38.5  PLT 244 209  MCV 90.4 91.5  MCH 30.1 30.4  MCHC 33.3 33.2  RDW 14.4 14.7*  LYMPHSABS 0.8*  --   MONOABS 0.7  --   EOSABS 0.1  --   BASOSABS 0.1  --     Chemistries  Recent Labs  Lab  04/04/17 0847 04/05/17 0441  NA 139 142  K 3.3* 2.9*  CL 101 104  CO2 26 26  GLUCOSE 110* 103*  BUN 26* 25*  CREATININE 1.60* 1.58*  CALCIUM 9.8 9.3  AST 16  --   ALT 8*  --   ALKPHOS 201*  --   BILITOT 1.2  --    ------------------------------------------------------------------------------------------------------------------ estimated creatinine clearance is 22.4 mL/min (A) (by C-G formula based on SCr of 1.58 mg/dL (H)). ------------------------------------------------------------------------------------------------------------------ Recent Labs    04/04/17 0847  HGBA1C 5.2   ------------------------------------------------------------------------------------------------------------------ No results for input(s): CHOL, HDL, LDLCALC, TRIG, CHOLHDL, LDLDIRECT in the last 72 hours. ------------------------------------------------------------------------------------------------------------------ No results for input(s): TSH, T4TOTAL, T3FREE, THYROIDAB in the last 72 hours.  Invalid input(s): FREET3 ------------------------------------------------------------------------------------------------------------------ No results for input(s): VITAMINB12, FOLATE, FERRITIN, TIBC, IRON, RETICCTPCT in the last 72 hours.  Coagulation profile Recent Labs  Lab 04/04/17 0847  INR 0.99    No results for input(s): DDIMER in the last 72 hours.  Cardiac Enzymes Recent Labs  Lab 04/04/17 0847 04/04/17 1233  TROPONINI 0.04* 0.04*   ------------------------------------------------------------------------------------------------------------------ Invalid input(s): POCBNP    Assessment & Plan  Patient is 84 year old presented with hypoglycemia  * Hypoglycemia Cortisol level was normal C-peptide is slightly elevated Blood  sugars currently stable continue to monitor   *Possible cystitis no nitrates not many WBCs IV ceftriaxone discontinued will use oral Ceftin  *Acute renal  failure - IVF   *Hypertension.  Continue atenolol.    Use IV hydralazine as needed  *Generalized weakness.  Progressively weak since a fall a few weeks back.   Seen by PT recommending skilled nursing facility will ask social worker to see  *History of atrial fibrillation continue Eliquis         Code Status Orders  (From admission, onward)        Start     Ordered   04/04/17 1326  Do not attempt resuscitation (DNR)  Continuous    Question Answer Comment  In the event of cardiac or respiratory ARREST Do not call a "code blue"   In the event of cardiac or respiratory ARREST Do not perform Intubation, CPR, defibrillation or ACLS   In the event of cardiac or respiratory ARREST Use medication by any route, position, wound care, and other measures to relive pain and suffering. May use oxygen, suction and manual treatment of airway obstruction as needed for comfort.      04/04/17 1327    Code Status History    Date Active Date Inactive Code Status Order ID Comments User Context   02/25/2017 16:05 02/25/2017 20:26 Full Code [id number]  Deboraha Sprang, MD Inpatient    Advance Directive Documentation     Most Recent Value  Type of Advance Directive  Out of facility DNR (pink MOST or yellow form), Living will  Pre-existing out of facility DNR order (yellow form or pink MOST form)  Yellow form placed in chart (order not valid for inpatient use), Physician notified to receive inpatient order  "MOST" Form in Place?  No data  Consults PT  DVT Prophylaxis Eliquis Lab Results  Component Value Date   PLT 209 04/05/2017     Time Spent in minutes   56min  Greater than 50% of time spent in care coordination and counseling patient regarding the condition and plan of care.   [name] M.D on 04/05/2017 at 1:36 PM  Between 7am to 6pm - Pager - [phone]  After 6pm go to [email or web address] - Proofreader  [employer]   Office  [phone]

## 2017-04-05 NOTE — Care Management Obs Status (Signed)
[facility] NOTIFICATION   Patient Details  Name: Jessica [name] MRN: [id number] Date of Birth: 07/30/1929   Medicare Observation Status Notification Given:  Yes son, Richardson Landry signed    Shelbie Ammons, RN 04/05/2017, 8:32 AM

## 2017-04-05 NOTE — Evaluation (Signed)
Physical Therapy Evaluation Patient Details Name: Jessica [name] MRN: [id number] DOB: [birth year] Today's Date: 04/05/2017   History of Present Illness  84 yo female with onset of fall and AMS with UTI pain in abd, had severe hypoglycemia, had mild troponin incr, low K+.  PMHx: AAA, AV block, gout, HTN, hernia repair, RW use recently, drove,   Clinical Impression  Pt was assessed for mobility with notable motor planning issues as well as pt awareness of where she is and why.  Talked about injuring her abd but has UTI, not recalling her medical diagnoses.  Plan is to get to SNF for rehab and more safety with her care as family has noted her safety issues as well. Plan to see acutely for PT to work on strength and balance until medical care is done.    Follow Up Recommendations SNF    Equipment Recommendations  None recommended by PT    Recommendations for Other Services       Precautions / Restrictions Precautions Precautions: Fall Restrictions Weight Bearing Restrictions: No      Mobility  Bed Mobility Overal bed mobility: Needs Assistance Bed Mobility: Supine to Sit;Sit to Supine     Supine to sit: Min assist Sit to supine: Min assist;Mod assist   General bed mobility comments: helped with pivoting legs to get up and then with cued directions helped trunk and legs back to bed  Transfers Overall transfer level: Needs assistance Equipment used: Rolling walker (2 wheeled);1 person hand held assist Transfers: Sit to/from Stand Sit to Stand: Min assist         General transfer comment: cued hand placement for safety  Ambulation/Gait Ambulation/Gait assistance: Min guard;Min assist Ambulation Distance (Feet): 150 Feet Assistive device: Rolling walker (2 wheeled);1 person hand held assist Gait Pattern/deviations: Step-through pattern;Step-to pattern;Decreased stride length;Narrow base of support(crosses over her steps at times) Gait velocity: reduced Gait velocity  interpretation: Below normal speed for age/gender    Stairs            Wheelchair Mobility    Modified Rankin (Stroke Patients Only)       Balance Overall balance assessment: Needs assistance;History of Falls Sitting-balance support: Bilateral upper extremity supported;Feet supported Sitting balance-Leahy Scale: Fair     Standing balance support: Bilateral upper extremity supported;During functional activity Standing balance-Leahy Scale: Poor                               Pertinent Vitals/Pain Pain Assessment: Faces Faces Pain Scale: Hurts little more Pain Location: abdomen Pain Descriptors / Indicators: Cramping Pain Intervention(s): Monitored during session;Repositioned    Home Living Family/patient expects to be discharged to:: Skilled nursing facility                 Additional Comments: family has been making arrangements    Prior Function Level of Independence: Independent with assistive device(s)         Comments: recently was driving and used RW in last 2 weeks for gait     Hand Dominance   Dominant Hand: Right    Extremity/Trunk Assessment   Upper Extremity Assessment Upper Extremity Assessment: Overall WFL for tasks assessed    Lower Extremity Assessment Lower Extremity Assessment: Generalized weakness    Cervical / Trunk Assessment Cervical / Trunk Assessment: Kyphotic  Communication   Communication: No difficulties  Cognition Arousal/Alertness: Awake/alert Behavior During Therapy: Impulsive Overall Cognitive Status: Impaired/Different from baseline Area of Impairment:  Attention;Memory;Following commands;Safety/judgement;Awareness;Problem solving;Orientation                 Orientation Level: Place;Time;Situation Current Attention Level: Selective Memory: Decreased recall of precautions;Decreased short-term memory Following Commands: Follows one step commands inconsistently Safety/Judgement: Decreased  awareness of safety;Decreased awareness of deficits Awareness: Intellectual Problem Solving: Slow processing;Difficulty sequencing;Requires verbal cues General Comments: requires help to change directions with RW      General Comments General comments (skin integrity, edema, etc.): has small bruises and broken skin on LE's likely from recent fall    Exercises     Assessment/Plan    PT Assessment Patient needs continued PT services  PT Problem List Decreased strength;Decreased range of motion;Decreased activity tolerance;Decreased balance;Decreased mobility;Decreased coordination;Decreased safety awareness;Decreased knowledge of use of DME;Decreased skin integrity;Pain       PT Treatment Interventions DME instruction;Gait training;Functional mobility training;Therapeutic activities;Therapeutic exercise;Balance training;Neuromuscular re-education;Patient/family education    PT Goals (Current goals can be found in the Care Plan section)  Acute Rehab PT Goals Patient Stated Goal: to get home and get stronger PT Goal Formulation: With patient/family Time For Goal Achievement: 04/19/17 Potential to Achieve Goals: Good    Frequency Min 2X/week   Barriers to discharge Decreased caregiver support home alone in [place]    Co-evaluation               AM-PAC PT "6 Clicks" Daily Activity  Outcome Measure Difficulty turning over in bed (including adjusting bedclothes, sheets and blankets)?: A Lot Difficulty moving from lying on back to sitting on the side of the bed? : Unable Difficulty sitting down on and standing up from a chair with arms (e.g., wheelchair, bedside commode, etc,.)?: Unable Help needed moving to and from a bed to chair (including a wheelchair)?: A Little Help needed walking in hospital room?: A Little Help needed climbing 3-5 steps with a railing? : A Lot 6 Click Score: 12    End of Session Equipment Utilized During Treatment: Gait belt Activity Tolerance: Patient  tolerated treatment well;Patient limited by fatigue Patient left: in bed;with call bell/phone within reach;with bed alarm set;with family/visitor present;with nursing/sitter in room Nurse Communication: Mobility status PT Visit Diagnosis: Unsteadiness on feet (R26.81);Muscle weakness (generalized) (M62.81);History of falling (Z91.81);Adult, failure to thrive (R62.7)    Time: 1025-8527 PT Time Calculation (min) (ACUTE ONLY): 20 min   Charges:   PT Evaluation $PT Eval Moderate Complexity: 1 Mod     PT G Codes:   PT G-Codes **NOT FOR INPATIENT CLASS** Functional Assessment Tool Used: AM-PAC 6 Clicks Basic Mobility    Ramond Dial 04/05/2017, 10:39 AM   Mee Hives, PT MS Acute Rehab Dept. Number: [place] and [place]

## 2017-04-05 NOTE — NC FL2 (Signed)
[place] LEVEL OF CARE SCREENING TOOL     IDENTIFICATION  Patient Name: Jessica [name] Birthdate: 1929/10/02 Sex: female Admission Date (Current Location): 04/04/2017  [name] and [place] Number:  Engineering geologist and Address:  [name], [address] [place], [place] [address]      Provider Number: [id number]  Attending Physician Name and Address:  [name], MD  Relative Name and Phone Number:       Current Level of Care: Hospital Recommended Level of Care: [place] Prior Approval Number:    Date Approved/Denied:   PASRR Number: [id number])  Discharge Plan: SNF    Current Diagnoses: Patient Active Problem List   Diagnosis Date Noted  . Hypoglycemia 04/04/2017  . Gout 06/05/2016  . AAA (abdominal aortic aneurysm) without rupture ([place]) 04/18/2016  . Loss of sense of smell 12/30/2015  . Memory difficulties 12/30/2015  . Injury of right lower extremity and cellulitis 11/29/2014  . 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 Renal artery stenosis ([place]) 09/15/2014  . Renal arterial aneurysm ([place]) 09/15/2014  . CN (constipation) 09/06/2014  . Change in stool caliber 09/06/2014  . Atherosclerosis of aorto-iliac bypass graft ([place]) 08/17/2014  . Shortness of breath 05/12/2014  . Conjunctival hemorrhage of right eye 08/13/2013  . Medicare annual wellness visit, subsequent 04/22/2013  . Allergic rhinitis 11/26/2012  . COPD (chronic obstructive pulmonary disease) ([place]) 09/27/2011  . Hyperlipidemia 02/22/2011  . Long term current use of anticoagulant 04/12/2010  . HYPERTENSION, BENIGN 08/25/2009  . CAROTID ARTERY STENOSIS, WITHOUT INFARCTION 08/24/2008  . AV BLOCK, COMPLETE 06/17/2008  . Aneurysm of abdominal vessel ([place]) 06/17/2008  . PACEMAKER-[place] 06/17/2008    Orientation RESPIRATION BLADDER Height & Weight     Self, Time  Normal Continent Weight: 120 lb (54.4 kg) Height:  5\' 5"  (165.1 cm)   BEHAVIORAL SYMPTOMS/MOOD NEUROLOGICAL BOWEL NUTRITION STATUS      Continent Diet(Diet: Regular )  AMBULATORY STATUS COMMUNICATION OF NEEDS Skin   Extensive Assist Verbally Normal                       Personal Care Assistance Level of Assistance  Bathing, Feeding, Dressing Bathing Assistance: Limited assistance Feeding assistance: Independent Dressing Assistance: Limited assistance     Functional Limitations Info  Sight, Hearing, Speech Sight Info: Adequate Hearing Info: Adequate Speech Info: Adequate    SPECIAL CARE FACTORS FREQUENCY  PT (By licensed PT)     PT Frequency: (5)              Contractures      Additional Factors Info  Code Status, Allergies Code Status Info: (DNR ) Allergies Info: (Codeine, Morphine, Zyrtec Cetirizine)           Current Medications (04/05/2017):  This is the current hospital active medication list Current Facility-Administered Medications  Medication Dose Route Frequency Provider Last Rate Last Dose  . 0.9 % NaCl with KCl 40 mEq / L  infusion   Intravenous Continuous [name], MD 75 mL/hr at 04/05/17 1110 75 mL/hr at 04/05/17 1110  . acetaminophen (TYLENOL) tablet 650 mg  650 mg Oral Q6H PRN Hillary Bow, MD   650 mg at 04/04/17 1919   Or  . acetaminophen (TYLENOL) suppository 650 mg  650 mg Rectal Q6H PRN Sudini, Alveta Heimlich, MD      . albuterol (PROVENTIL) (2.5 MG/3ML) 0.083% nebulizer solution 2.5 mg  2.5 mg Nebulization Q2H PRN Hillary Bow, MD      .  apixaban (ELIQUIS) tablet 2.5 mg  2.5 mg Oral BID Hillary Bow, MD   2.5 mg at 04/05/17 1022  . atenolol (TENORMIN) tablet 25 mg  25 mg Oral Daily Hillary Bow, MD   25 mg at 04/05/17 1305  . hydrALAZINE (APRESOLINE) injection 10 mg  10 mg Intravenous Q6H PRN Hillary Bow, MD   10 mg at 04/04/17 1444  . ondansetron (ZOFRAN) tablet 4 mg  4 mg Oral Q6H PRN Hillary Bow, MD       Or  . ondansetron (ZOFRAN) injection 4 mg  4 mg Intravenous Q6H PRN Sudini, Srikar, MD       . polyethylene glycol (MIRALAX / GLYCOLAX) packet 17 g  17 g Oral Daily PRN Sudini, Alveta Heimlich, MD      . tiotropium (SPIRIVA) inhalation capsule 18 mcg  18 mcg Inhalation Daily Hillary Bow, MD   18 mcg at 04/05/17 1019  . traMADol (ULTRAM) tablet 50 mg  50 mg Oral Q6H PRN Hillary Bow, MD         Discharge Medications: Please see discharge summary for a list of discharge medications.  Relevant Imaging Results:  Relevant Lab Results:   Additional Information (SSN: [id number])  Aeva Posey, Veronia Beets, LCSW

## 2017-04-05 NOTE — Clinical Social Work Placement (Signed)
   CLINICAL SOCIAL WORK PLACEMENT  NOTE  Date:  04/05/2017  Patient Details  Name: Jessica [name] MRN: [id number] Date of Birth: April 02, 1929  Clinical Social Work is seeking post-discharge placement for this patient at the [place] level of care (*CSW will initial, date and re-position this form in  chart as items are completed):  Yes   Patient/family provided with [place] Work Department's list of facilities offering this level of care within the geographic area requested by the patient (or if unable, by the patient's family).  Yes   Patient/family informed of their freedom to choose among providers that offer the needed level of care, that participate in Medicare, Medicaid or managed care program needed by the patient, have an available bed and are willing to accept the patient.  Yes   Patient/family informed of [place]'s ownership interest in [facility] and [facility], as well as of the fact that they are under no obligation to receive care at these facilities.  PASRR submitted to EDS on 04/05/17     PASRR number received on 04/05/17     Existing PASRR number confirmed on       FL2 transmitted to all facilities in geographic area requested by pt/family on 04/05/17     FL2 transmitted to all facilities within larger geographic area on       Patient informed that his/her managed care company has contracts with or will negotiate with certain facilities, including the following:            Patient/family informed of bed offers received.  Patient chooses bed at       Physician recommends and patient chooses bed at      Patient to be transferred to   on  .  Patient to be transferred to facility by       Patient family notified on   of transfer.  Name of family member notified:        PHYSICIAN       Additional Comment:    _______________________________________________ Megin Consalvo, Veronia Beets, LCSW 04/05/2017, 3:16 PM

## 2017-04-05 NOTE — Clinical Social Work Note (Signed)
Clinical Social Work Assessment  Patient Details  Name: Jessica [name] MRN: [id number] Date of Birth: 06-17-1929  Date of referral:  04/05/17               Reason for consult:  Facility Placement, Other (Comment Required)(From [place]. )                Permission sought to share [name] with:  Chartered certified accountant granted to share [name]::  Yes, Verbal Permission Granted  Name::      [facility]::   Jessica [name]   Relationship::     Contact [name]:     Housing/Transportation Living arrangements for the past 2 months:  Jessica [name]:  Patient, Adult Children Patient Interpreter Needed:  None Criminal Activity/Legal Involvement Pertinent to Current Situation/Hospitalization:  No - Comment as needed Significant Relationships:  Adult Children Lives with:  Self Do you feel safe going back to the place where you live?  Yes Need for family participation in patient care:  Yes (Comment)  Care giving concerns:  Patient lives at [place] [facility].    Social Worker assessment / plan:  Holiday representative (CSW) received SNF consult. PT is recommending SNF however patient walker 150 feet, insurance will not approve SNF. CSW met with patient and her son Jessica [name] was at bedside. CSW made them aware of above. CSW offered private pay for SNF or home health. Patient and son reported that they can pay privately to go to [facility] and would like for CSW to send referral there. CSW also spoke with patient's daughter in law who works at [employer]. Per daughter in law she will talk with admissions coordinator Jessica [name] at [facility]. FL2 complete and faxed out. CSW left a Advertising account executive for [employer] at [facility]. CSW also started [employer] SNF authorization however it is very unlikely Health Team will approve SNF.    Employment status:  Retired Nurse, adult PT Recommendations:  [place] / Referral to community resources:  [place]  Patient/Family's Response to care:  Patient requested to go to [employer] under private pay.   Patient/Family's Understanding of and Emotional Response to Diagnosis, Current Treatment, and Prognosis:  Patient was very pleasant and thanked CSW for assistance.   Emotional Assessment Appearance:  Appears stated age Attitude/Demeanor/Rapport:    Affect (typically observed):  Accepting, Adaptable, Pleasant Orientation:  Oriented to Self, Oriented to Place, Oriented to  Time, Oriented to Situation Alcohol / Substance use:  Not Applicable Psych involvement (Current and /or in the community):  No (Comment)  Discharge Needs  Concerns to be addressed:  Discharge Planning Concerns Readmission within the last 30 days:  No Current discharge risk:  Cognitively Impaired Barriers to Discharge:  Continued Medical Work up   [employer], Jessica Beets, Jessica [name] 04/05/2017, 3:17 PM

## 2017-04-06 DIAGNOSIS — I70399 Other atherosclerosis of unspecified type of bypass graft(s) of the extremities, unspecified extremity: Secondary | ICD-10-CM | POA: Diagnosis not present

## 2017-04-06 DIAGNOSIS — N179 Acute kidney failure, unspecified: Secondary | ICD-10-CM | POA: Diagnosis not present

## 2017-04-06 DIAGNOSIS — N3000 Acute cystitis without hematuria: Secondary | ICD-10-CM | POA: Diagnosis not present

## 2017-04-06 DIAGNOSIS — I714 Abdominal aortic aneurysm, without rupture: Secondary | ICD-10-CM | POA: Diagnosis not present

## 2017-04-06 DIAGNOSIS — R41841 Cognitive communication deficit: Secondary | ICD-10-CM | POA: Diagnosis not present

## 2017-04-06 DIAGNOSIS — E162 Hypoglycemia, unspecified: Secondary | ICD-10-CM | POA: Diagnosis not present

## 2017-04-06 DIAGNOSIS — J449 Chronic obstructive pulmonary disease, unspecified: Secondary | ICD-10-CM | POA: Diagnosis not present

## 2017-04-06 DIAGNOSIS — E860 Dehydration: Secondary | ICD-10-CM | POA: Diagnosis not present

## 2017-04-06 DIAGNOSIS — R2681 Unsteadiness on feet: Secondary | ICD-10-CM | POA: Diagnosis not present

## 2017-04-06 DIAGNOSIS — M6281 Muscle weakness (generalized): Secondary | ICD-10-CM | POA: Diagnosis not present

## 2017-04-06 DIAGNOSIS — G9340 Encephalopathy, unspecified: Secondary | ICD-10-CM | POA: Diagnosis not present

## 2017-04-06 DIAGNOSIS — I6529 Occlusion and stenosis of unspecified carotid artery: Secondary | ICD-10-CM | POA: Diagnosis not present

## 2017-04-06 DIAGNOSIS — E161 Other hypoglycemia: Secondary | ICD-10-CM | POA: Diagnosis not present

## 2017-04-06 DIAGNOSIS — R7989 Other specified abnormal findings of blood chemistry: Secondary | ICD-10-CM | POA: Diagnosis not present

## 2017-04-06 DIAGNOSIS — I10 Essential (primary) hypertension: Secondary | ICD-10-CM | POA: Diagnosis not present

## 2017-04-06 DIAGNOSIS — J984 Other disorders of lung: Secondary | ICD-10-CM | POA: Diagnosis not present

## 2017-04-06 DIAGNOSIS — R5381 Other malaise: Secondary | ICD-10-CM | POA: Diagnosis not present

## 2017-04-06 DIAGNOSIS — I442 Atrioventricular block, complete: Secondary | ICD-10-CM | POA: Diagnosis not present

## 2017-04-06 LAB — CBC
HCT: 37.2 % (ref 35.0–47.0)
Hemoglobin: 11.9 g/dL — ABNORMAL LOW (ref 12.0–16.0)
MCH: 29.5 pg (ref 26.0–34.0)
MCHC: 32 g/dL (ref 32.0–36.0)
MCV: 92.1 fL (ref 80.0–100.0)
Platelets: 163 10*3/uL (ref 150–440)
RBC: 4.04 MIL/uL (ref 3.80–5.20)
RDW: 15 % — ABNORMAL HIGH (ref 11.5–14.5)
WBC: 5.3 10*3/uL (ref 3.6–11.0)

## 2017-04-06 LAB — BASIC METABOLIC PANEL
Anion gap: 7 (ref 5–15)
BUN: 24 mg/dL — ABNORMAL HIGH (ref 6–20)
CO2: 23 mmol/L (ref 22–32)
Calcium: 9.3 mg/dL (ref 8.9–10.3)
Chloride: 113 mmol/L — ABNORMAL HIGH (ref 101–111)
Creatinine, Ser: 1.34 mg/dL — ABNORMAL HIGH (ref 0.44–1.00)
GFR calc Af Amer: 41 mL/min — ABNORMAL LOW (ref >60)
GFR calc non Af Amer: 35 mL/min — ABNORMAL LOW (ref >60)
Glucose, Bld: 99 mg/dL (ref 65–99)
Potassium: 5 mmol/L (ref 3.5–5.1)
Sodium: 143 mmol/L (ref 135–145)

## 2017-04-06 LAB — GLUCOSE, CAPILLARY: Glucose-Capillary: 97 mg/dL (ref 65–99)

## 2017-04-06 MED ORDER — AMLODIPINE BESYLATE 5 MG PO TABS
5.0000 mg | ORAL_TABLET | Freq: Every day | ORAL | Status: DC
  Administered 2017-04-06: 5 mg via ORAL
  Filled 2017-04-06: qty 1

## 2017-04-06 MED ORDER — POLYETHYLENE GLYCOL 3350 17 G PO PACK: 17.0000 g | PACK | Freq: Every day | ORAL | 0 refills | Status: DC | PRN

## 2017-04-06 MED ORDER — AMLODIPINE BESYLATE 5 MG PO TABS: 5.0000 mg | ORAL_TABLET | Freq: Every day | ORAL | 0 refills | Status: DC

## 2017-04-06 NOTE — Progress Notes (Signed)
MD order received to discharge pt to [facility] today; Care Management/SW previously prepared discharge packet for pt; pt's daughter-in-law, Anderson Malta, to transport pt to [facility]; telephone report called to Amy at [facility] [phone], no questions voiced at this time; pt's discharge pending arrival of Anderson Malta to transport pt to the facility

## 2017-04-06 NOTE — Progress Notes (Signed)
Pt's ride, Jessica [name], present for discharge; discharge packet given to Jessica [name] to take to [facility]; pt discharged via wheelchair by nursing to the visitor's entrance

## 2017-04-06 NOTE — Discharge Summary (Signed)
[place] [facility] [place] NAME: Jessica [name]    MR#:  [id number]  DATE OF BIRTH:  09-06-1929  DATE OF ADMISSION:  04/04/2017 ADMITTING PHYSICIAN: Hillary Bow, MD  DATE OF DISCHARGE: 04/06/2017  PRIMARY CARE PHYSICIAN: Leone Haven, MD    ADMISSION DIAGNOSIS:  Dehydration [E86.0] Hypoglycemia [E16.2] Encephalopathy [G93.40] Acute cystitis with hematuria [N30.01]  DISCHARGE DIAGNOSIS:  Acute hypoglycemia suspected due to poor p.o. intake now improved Acute encephalopathy secondary to hypoglycemia and dehydration --improved SECONDARY DIAGNOSIS:   Past Medical History:  Diagnosis Date  . AAA (abdominal aortic aneurysm) ([place])   . Abdominal aneurysm without mention of rupture   . Atrioventricular block, complete ([place])   . Cardiac pacemaker in situ   . Gout   . Hernia   . Hyperlipidemia   . Hypertension     HOSPITAL COURSE:  Jessica [name]  is a 84 y.o. female with a known history of hypertension, gout presents to the emergency room due to altered mental status.  Initial evaluation by EMS found her blood glucose level to be 21.  Patient was given oral glucose  *Acute Hypoglycemia--suspected from poor p.o. intake Cortisol level was normal C-peptide is slightly elevated Blood  sugars currently stable continue to monitor Sugars are much improved and steady.  P.o. intake improved.  *Possible cystitis no nitrates not many WBCs Culture negative.  White count normal.  Patient received 3 days of antibiotic.  No indication for continuation of oral antibiotic at present  *Acute renal failure-Creatinine improved with IV fluids  *Hypertension. Continue atenolol.  -Add low-dose amlodipine - Use IV hydralazine as needed  *Generalized weakness. Progressively weak since a fall a few weeks back.  -Patient has significant generalized weakness.  She will benefit from physical therapy at rehab. -We will discharge her to [facility].  *History of atrial fibrillation continue Eliquis  D/w Anderson Malta patient's family who is going to provide transportation for patient to [facility]  CONSULTS OBTAINED:    DRUG ALLERGIES:   Allergies  Allergen Reactions  . Codeine Other (See Comments)    Made her feel crazy  . Morphine Other (See Comments)    Made her feel crazy  . Zyrtec [Cetirizine] Other (See Comments)    Causes facial numbness    DISCHARGE MEDICATIONS:   Allergies as of 04/06/2017      Reactions   Codeine Other (See Comments)   Made her feel crazy   Morphine Other (See Comments)   Made her feel crazy   Zyrtec [cetirizine] Other (See Comments)   Causes facial numbness      Medication List    STOP taking these medications   oxyCODONE-acetaminophen 5-325 MG tablet Commonly known as:  PERCOCET     TAKE these medications   acetaminophen 500 MG tablet Commonly known as:  TYLENOL Take 500 mg by mouth every 6 (six) hours as needed for moderate pain or headache.   albuterol 108 (90 Base) MCG/ACT inhaler Commonly known as:  PROAIR HFA Inhale 2 puffs into the lungs every 6 (six) hours as needed for wheezing or shortness of breath.   amLODipine 5 MG tablet Commonly known as:  NORVASC Take 1 tablet (5 mg total) by mouth daily.   apixaban 2.5 MG Tabs tablet Commonly known as:  ELIQUIS Take 1 tablet (2.5 mg total) by mouth 2 (two) times daily.   atenolol 25 MG tablet Commonly known as:  TENORMIN Take 25 mg by mouth  daily.   atenolol 25 MG tablet Commonly known as:  TENORMIN TAKE 1 TABLET BY MOUTH EVERY DAY.   polyethylene glycol packet Commonly known as:  MIRALAX / GLYCOLAX Take 17 g by mouth daily as needed for mild constipation.   SPIRIVA HANDIHALER 18 MCG inhalation capsule Generic drug:  tiotropium INHALE ONE CAPSULE AS DIRECTED ONCE A DAY   traMADol 50 MG tablet Commonly known as:  ULTRAM Take 1 tablet (50 mg total) by mouth every 8 (eight) hours as needed.   VITAMIN  B-12 PO Take 1 tablet by mouth daily.   VITAMIN D3 PO Take 1 capsule by mouth daily.       If you experience worsening of your admission symptoms, develop shortness of breath, life threatening emergency, suicidal or homicidal thoughts you must seek medical attention immediately by calling 911 or calling your MD immediately  if symptoms less severe.  You Must read complete instructions/literature along with all the possible adverse reactions/side effects for all the Medicines you take and that have been prescribed to you. Take any new Medicines after you have completely understood and accept all the possible adverse reactions/side effects.   Please note  You were cared for by a hospitalist during your hospital stay. If you have any questions about your discharge medications or the care you received while you were in the hospital after you are discharged, you can call the unit and asked to speak with the hospitalist on call if the hospitalist that took care of you is not available. Once you are discharged, your primary care physician will handle any further medical issues. Please note that NO REFILLS for any discharge medications will be authorized once you are discharged, as it is imperative that you return to your primary care physician (or establish a relationship with a primary care physician if you do not have one) for your aftercare needs so that they can reassess your need for medications and monitor your lab values. Today   SUBJECTIVE   Overall feels better.  Improved p.o. intake.  VITAL SIGNS:  Blood pressure (!) 172/65, pulse 69, temperature 97.8 F (36.6 C), resp. rate 17, height 5\' 5"  (1.651 m), weight 55.3 kg (121 lb 14.6 oz), SpO2 100 %.  I/O:    Intake/Output Summary (Last 24 hours) at 04/06/2017 0835 Last data filed at 04/05/2017 1517 Gross per 24 hour  Intake 780 ml  Output -  Net 780 ml    PHYSICAL EXAMINATION:  GENERAL:  84 y.o.-year-old patient lying in the bed  with no acute distress.  EYES: Pupils equal, round, reactive to light and accommodation. No scleral icterus. Extraocular muscles intact.  HEENT: Head atraumatic, normocephalic. Oropharynx and nasopharynx clear.  NECK:  Supple, no jugular venous distention. No thyroid enlargement, no tenderness.  LUNGS: Normal breath sounds bilaterally, no wheezing, rales,rhonchi or crepitation. No use of accessory muscles of respiration.  CARDIOVASCULAR: S1, S2 normal. No murmurs, rubs, or gallops.  ABDOMEN: Soft, non-tender, non-distended. Bowel sounds present. No organomegaly or mass.  EXTREMITIES: No pedal edema, cyanosis, or clubbing.  NEUROLOGIC: Cranial nerves II through XII are intact. Muscle strength 5/5 in all extremities. Sensation intact. Gait not checked.  PSYCHIATRIC: The patient is alert and oriented x 3.  SKIN: No obvious rash, lesion, or ulcer.   DATA REVIEW:   CBC  Recent Labs  Lab 04/06/17 0528  WBC 5.3  HGB 11.9*  HCT 37.2  PLT 163    Chemistries  Recent Labs  Lab 04/04/17 0847 04/05/17  0441 04/06/17 0528  NA 139 142 143  K 3.3* 2.9* 5.0  CL 101 104 113*  CO2 26 26 23   GLUCOSE 110* 103* 99  BUN 26* 25* 24*  CREATININE 1.60* 1.58* 1.34*  CALCIUM 9.8 9.3 9.3  MG  --  1.5*  --   AST 16  --   --   ALT 8*  --   --   ALKPHOS 201*  --   --   BILITOT 1.2  --   --     Microbiology Results   Recent Results (from the past 240 hour(s))  Urine Culture     Status: None   Collection Time: 04/04/17  8:47 AM  Result Value Ref Range Status   Specimen Description   Final    URINE, RANDOM Performed at [facility], [address], [place], [place] [address]    Special Requests   Final    NONE Performed at [facility], [address], [place], [place] [address]    Culture   Final    NO GROWTH Performed at [facility], [place] [address], [place], [place] [address]    Report Status 04/05/2017 FINAL  Final  CULTURE, BLOOD (ROUTINE X 2) w Reflex to ID  Panel     Status: None (Preliminary result)   Collection Time: 04/04/17  1:33 PM  Result Value Ref Range Status   Specimen Description BLOOD BLOOD LEFT WRIST  Final   Special Requests   Final    BOTTLES DRAWN AEROBIC AND ANAEROBIC Blood Culture results may not be optimal due to an excessive volume of blood received in culture bottles   Culture   Final    NO GROWTH 2 DAYS Performed at [facility], [address], [place], [place] [address]    Report Status PENDING  Incomplete  CULTURE, BLOOD (ROUTINE X 2) w Reflex to ID Panel     Status: None (Preliminary result)   Collection Time: 04/04/17  1:33 PM  Result Value Ref Range Status   Specimen Description BLOOD LEFT ANTECUBITAL  Final   Special Requests   Final    BOTTLES DRAWN AEROBIC AND ANAEROBIC Blood Culture results may not be optimal due to an excessive volume of blood received in culture bottles   Culture   Final    NO GROWTH 2 DAYS Performed at [facility], [address], [place], [place] [address]    Report Status PENDING  Incomplete  MRSA PCR Screening     Status: None   Collection Time: 04/04/17  2:49 PM  Result Value Ref Range Status   MRSA by PCR NEGATIVE NEGATIVE Final    Comment:        The GeneXpert MRSA Assay (FDA approved for NASAL specimens only), is one component of a comprehensive MRSA colonization surveillance program. It is not intended to diagnose MRSA infection nor to guide or monitor treatment for MRSA infections. Performed at [facility], [address] [place], [place] [address]     RADIOLOGY:  Ct Head Wo Contrast  Result Date: 04/04/2017 CLINICAL DATA:  Hypoglycemia.  Confusion. EXAM: CT HEAD WITHOUT CONTRAST TECHNIQUE: Contiguous axial images were obtained from the base of the skull through the vertex without intravenous contrast. COMPARISON:  03/12/2017 FINDINGS: Brain: Generalized atrophy as seen previously. Chronic small-vessel ischemic changes of the white  matter. No sign of acute infarction, mass lesion, hemorrhage, hydrocephalus or extra-axial collection. Vascular: There is atherosclerotic calcification of the major vessels  at the base of the brain. Skull: Normal Sinuses/Orbits: Clear/normal Other: None IMPRESSION: No acute finding by CT. Atrophy and chronic small-vessel ischemic changes. Electronically Signed   By: Nelson Chimes M.D.   On: 04/04/2017 09:36     Management plans discussed with the patient, family and they are in agreement.  CODE STATUS:     Code Status Orders  (From admission, onward)        Start     Ordered   04/04/17 1326  Do not attempt resuscitation (DNR)  Continuous    Question Answer Comment  In the event of cardiac or respiratory ARREST Do not call a "code blue"   In the event of cardiac or respiratory ARREST Do not perform Intubation, CPR, defibrillation or ACLS   In the event of cardiac or respiratory ARREST Use medication by any route, position, wound care, and other measures to relive pain and suffering. May use oxygen, suction and manual treatment of airway obstruction as needed for comfort.      04/04/17 1327    Code Status History    Date Active Date Inactive Code Status Order ID Comments User Context   02/25/2017 16:05 02/25/2017 20:26 Full Code [id number]  Deboraha Sprang, MD Inpatient    Advance Directive Documentation     Most Recent Value  Type of Advance Directive  Out of facility DNR (pink MOST or yellow form), Living will  Pre-existing out of facility DNR order (yellow form or pink MOST form)  Yellow form placed in chart (order not valid for inpatient use), Physician notified to receive inpatient order  "MOST" Form in Place?  No data      TOTAL TIME TAKING CARE OF THIS PATIENT: *40* minutes.    Fritzi Mandes M.D on 04/06/2017 at 8:35 AM  Between 7am to 6pm - Pager - [phone] After 6pm go to [email or web address] - password [email or web address] [place] [facility]  Office  [phone]  CC: Primary  care physician; Leone Haven, MD

## 2017-04-06 NOTE — Clinical Social Work Note (Signed)
The patient will discharge today to [facility] via family transportation. The facility and family are aware and in agreement. The CSW has delivered the discharge packet including the DNR. The CSW is signing off. Please consult should additional needs arise.  Santiago Bumpers, MSW, Latanya Presser [phone]

## 2017-04-06 NOTE — Care Management Important Message (Signed)
Important Message  Patient Details  Name: Jessica [name] MRN: [id number] Date of Birth: 08/12/1929   Medicare Important Message Given:  N/A - LOS <3 / Initial given by admissions    Katrina Stack, RN 04/06/2017, 4:58 PM

## 2017-04-09 DIAGNOSIS — E161 Other hypoglycemia: Secondary | ICD-10-CM | POA: Diagnosis not present

## 2017-04-09 DIAGNOSIS — R5381 Other malaise: Secondary | ICD-10-CM | POA: Diagnosis not present

## 2017-04-09 DIAGNOSIS — R7989 Other specified abnormal findings of blood chemistry: Secondary | ICD-10-CM | POA: Diagnosis not present

## 2017-04-09 DIAGNOSIS — E860 Dehydration: Secondary | ICD-10-CM | POA: Diagnosis not present

## 2017-04-09 LAB — CULTURE, BLOOD (ROUTINE X 2)
Culture: NO GROWTH
Culture: NO GROWTH

## 2017-04-15 NOTE — Telephone Encounter (Signed)
Referral was refaxed to [facility].

## 2017-04-16 DIAGNOSIS — E161 Other hypoglycemia: Secondary | ICD-10-CM | POA: Diagnosis not present

## 2017-04-16 DIAGNOSIS — I10 Essential (primary) hypertension: Secondary | ICD-10-CM | POA: Diagnosis not present

## 2017-04-16 DIAGNOSIS — R5381 Other malaise: Secondary | ICD-10-CM | POA: Diagnosis not present

## 2017-04-16 DIAGNOSIS — J984 Other disorders of lung: Secondary | ICD-10-CM | POA: Diagnosis not present

## 2017-04-20 DIAGNOSIS — I2510 Atherosclerotic heart disease of native coronary artery without angina pectoris: Secondary | ICD-10-CM | POA: Diagnosis not present

## 2017-04-20 DIAGNOSIS — I4891 Unspecified atrial fibrillation: Secondary | ICD-10-CM | POA: Diagnosis not present

## 2017-04-20 DIAGNOSIS — Z7901 Long term (current) use of anticoagulants: Secondary | ICD-10-CM | POA: Diagnosis not present

## 2017-04-20 DIAGNOSIS — I10 Essential (primary) hypertension: Secondary | ICD-10-CM | POA: Diagnosis not present

## 2017-04-20 DIAGNOSIS — M109 Gout, unspecified: Secondary | ICD-10-CM | POA: Diagnosis not present

## 2017-04-20 DIAGNOSIS — I442 Atrioventricular block, complete: Secondary | ICD-10-CM | POA: Diagnosis not present

## 2017-04-20 DIAGNOSIS — J449 Chronic obstructive pulmonary disease, unspecified: Secondary | ICD-10-CM | POA: Diagnosis not present

## 2017-04-20 DIAGNOSIS — G4700 Insomnia, unspecified: Secondary | ICD-10-CM | POA: Diagnosis not present

## 2017-04-20 DIAGNOSIS — Z950 Presence of cardiac pacemaker: Secondary | ICD-10-CM | POA: Diagnosis not present

## 2017-04-20 DIAGNOSIS — E785 Hyperlipidemia, unspecified: Secondary | ICD-10-CM | POA: Diagnosis not present

## 2017-04-20 DIAGNOSIS — J309 Allergic rhinitis, unspecified: Secondary | ICD-10-CM | POA: Diagnosis not present

## 2017-04-20 DIAGNOSIS — Z9181 History of falling: Secondary | ICD-10-CM | POA: Diagnosis not present

## 2017-04-20 DIAGNOSIS — I714 Abdominal aortic aneurysm, without rupture: Secondary | ICD-10-CM | POA: Diagnosis not present

## 2017-04-22 ENCOUNTER — Ambulatory Visit (INDEPENDENT_AMBULATORY_CARE_PROVIDER_SITE_OTHER): Payer: Self-pay | Admitting: Orthopaedic Surgery

## 2017-04-22 ENCOUNTER — Telehealth: Payer: Self-pay

## 2017-04-22 NOTE — Telephone Encounter (Signed)
Verbal orders can be given. 

## 2017-04-22 NOTE — Telephone Encounter (Signed)
Please advise 

## 2017-04-22 NOTE — Telephone Encounter (Signed)
Copied from [place] [id number]. Topic: Inquiry >> Apr 22, 2017  3:19 PM Tye [place] wrote: Verbal orders are needed for Home health orders for Pt, 2x wk / 8wks, contact Marya Amsler [phone]

## 2017-04-23 ENCOUNTER — Other Ambulatory Visit: Payer: Self-pay

## 2017-04-23 NOTE — Patient Outreach (Signed)
[facility]) Care Management  04/23/2017  Jessica [name] 09-Jun-1929 [id number]   Referral Date: 04/23/17 Referral Source: HTA Report Date of Admission: 04/06/17 Diagnosis: AMS, dehydration, and hypoglycemia Date of Discharge: 04/18/17 Facility: [place]: HTA  Outreach attempt # 1 Spoke with patient she is able to verify HIPAA.  Patient reports she is doing pretty good since being home. Patient reports that she has home health through [facility] and last visit was yesterday.    Social: Patient [place] own apartment.  She states that son and daughter in law check on her frequently.  She also states that she has someone that comes in to do cleaning for her as well.   Conditions: Patient recently admitted for altered mental status, dehydration, and hypoglycemia. Patient reports she was told she had a UTI.  Patient went to [facility] for rehab.   Medications: Patient has no questions or concerns about medications.  Patient able to review medications.   Appointments:  Patient has not made her follow up with her PCP as she has to make sure her son can take her but assured CM that she will be making her appointment.    Consent: RN CM reviewed [facility] services with patient. Patient declined services at this time.  Plan: RN CM will send letter and close case at this time.     Jone Baseman, RN, MSN [facility] Care Management Care Management Coordinator Direct Line [phone] Toll Free: [phone]  Fax: [phone]

## 2017-04-23 NOTE — Telephone Encounter (Signed)
This is not a correct number, please advise

## 2017-04-24 ENCOUNTER — Telehealth: Payer: Self-pay | Admitting: Family Medicine

## 2017-04-24 NOTE — Telephone Encounter (Signed)
Verbal orders can be given. 

## 2017-04-24 NOTE — Telephone Encounter (Signed)
Copied from [place]. Topic: General - Other >> Apr 24, 2017  2:19 PM Yvette Rack wrote: Reason for CRM: Shirlean Mylar from [place] [phone] calling for verbal orders for upper extremities exercise and conditioning  One time for one week  2 times a week for three weeks

## 2017-04-24 NOTE — Telephone Encounter (Signed)
Error

## 2017-04-24 NOTE — Telephone Encounter (Signed)
Please advise 

## 2017-04-24 NOTE — Telephone Encounter (Signed)
Copied from [place]. Topic: General - Other >> Apr 24, 2017  2:19 PM Yvette Rack wrote: Reason for CRM: Shirlean Mylar from [place] [phone] calling for verbal orders for upper extremities exercise and conditioning  One time for one week  2 times a week for three weeks

## 2017-04-25 NOTE — Telephone Encounter (Signed)
Verbal orders given  

## 2017-04-26 ENCOUNTER — Telehealth: Payer: Self-pay

## 2017-04-26 NOTE — Telephone Encounter (Signed)
[facility] from [facility] is calling for order  For skilled nursing once a week for 5 weeks with two as needed and home health aid twice a week for two weeks.[phone] ext [phone]

## 2017-04-26 NOTE — Telephone Encounter (Signed)
Verbal orders can be given. 

## 2017-04-29 ENCOUNTER — Telehealth: Payer: Self-pay

## 2017-04-29 NOTE — Telephone Encounter (Signed)
Called Horris Latino with [facility] and informed her that according to Mrs. Chapel's med list she was prescribed Tramadol on 03-28-17 by Biagio Borg, PA-C. She stated that she was not aware of this but states she will speak with Mrs. Rodocker about the medication.   Copied from [place] [phone]. Topic: General - Other >> Apr 29, 2017  1:28 PM Yvette Rack wrote: Reason for CRM: Horris Latino physical therapist from [facility] [phone] calling to see if pt provider could prescribe her a muscle relaxer she states that it would help with her pain the pain hadn't subsided from  her hip and back pain level is a 7-8 pt statss that its very pain full

## 2017-04-29 NOTE — Telephone Encounter (Signed)
Left detailed message with orders.

## 2017-04-30 ENCOUNTER — Telehealth: Payer: Self-pay

## 2017-04-30 NOTE — Telephone Encounter (Signed)
Copied from [place] [phone]. Topic: Quick Communication - See Telephone Encounter >> Apr 30, 2017  8:35 AM Antonieta Iba C wrote: CRM for notification. See Telephone encounter for: 04/30/17.  Di Kindle with [facility] called in to get verbal orders for speech therapy.   Frequency: 1 time 3 weeks end date of May 4th.  [phone]

## 2017-04-30 NOTE — Telephone Encounter (Signed)
Please advise 

## 2017-04-30 NOTE — Telephone Encounter (Signed)
Therapist said she noticed yesterday that she had some memory issues for words during her evaluation and some confusion to these words , so therapist wanted a chance to help her work this out. Verbal given for therapy and therapist to fax over copy of evaluation.

## 2017-04-30 NOTE — Telephone Encounter (Signed)
Left message to return call, ok for pec to speak to therapist to find out what this is for

## 2017-04-30 NOTE — Telephone Encounter (Addendum)
Verbal orders can be given for speech therapy though please find out what she needs this for.  Thanks.

## 2017-04-30 NOTE — Telephone Encounter (Signed)
Noted  

## 2017-05-01 ENCOUNTER — Inpatient Hospital Stay: Payer: PPO | Admitting: Family Medicine

## 2017-05-09 ENCOUNTER — Other Ambulatory Visit: Payer: Self-pay | Admitting: Internal Medicine

## 2017-05-09 ENCOUNTER — Inpatient Hospital Stay: Payer: PPO | Admitting: Family Medicine

## 2017-05-09 DIAGNOSIS — E162 Hypoglycemia, unspecified: Secondary | ICD-10-CM

## 2017-05-09 NOTE — Progress Notes (Signed)
Pt reports low blood sugars at home  Check cbg today 10  Will have f/u with PCP  Please do not skip meals and check batteries in meter    TMS

## 2017-05-13 DIAGNOSIS — Z950 Presence of cardiac pacemaker: Secondary | ICD-10-CM | POA: Diagnosis not present

## 2017-05-13 DIAGNOSIS — J449 Chronic obstructive pulmonary disease, unspecified: Secondary | ICD-10-CM | POA: Diagnosis not present

## 2017-05-13 DIAGNOSIS — M109 Gout, unspecified: Secondary | ICD-10-CM | POA: Diagnosis not present

## 2017-05-13 DIAGNOSIS — I10 Essential (primary) hypertension: Secondary | ICD-10-CM | POA: Diagnosis not present

## 2017-05-13 DIAGNOSIS — I714 Abdominal aortic aneurysm, without rupture: Secondary | ICD-10-CM | POA: Diagnosis not present

## 2017-05-13 DIAGNOSIS — I4891 Unspecified atrial fibrillation: Secondary | ICD-10-CM | POA: Diagnosis not present

## 2017-05-13 DIAGNOSIS — I2510 Atherosclerotic heart disease of native coronary artery without angina pectoris: Secondary | ICD-10-CM | POA: Diagnosis not present

## 2017-05-13 DIAGNOSIS — G4700 Insomnia, unspecified: Secondary | ICD-10-CM | POA: Diagnosis not present

## 2017-05-13 DIAGNOSIS — Z9181 History of falling: Secondary | ICD-10-CM | POA: Diagnosis not present

## 2017-05-13 DIAGNOSIS — J309 Allergic rhinitis, unspecified: Secondary | ICD-10-CM | POA: Diagnosis not present

## 2017-05-13 DIAGNOSIS — I442 Atrioventricular block, complete: Secondary | ICD-10-CM | POA: Diagnosis not present

## 2017-05-13 DIAGNOSIS — Z7901 Long term (current) use of anticoagulants: Secondary | ICD-10-CM | POA: Diagnosis not present

## 2017-05-13 DIAGNOSIS — E785 Hyperlipidemia, unspecified: Secondary | ICD-10-CM | POA: Diagnosis not present

## 2017-05-20 ENCOUNTER — Ambulatory Visit (INDEPENDENT_AMBULATORY_CARE_PROVIDER_SITE_OTHER): Payer: PPO | Admitting: Family Medicine

## 2017-05-20 ENCOUNTER — Encounter: Payer: Self-pay | Admitting: Family Medicine

## 2017-05-20 ENCOUNTER — Other Ambulatory Visit: Payer: Self-pay

## 2017-05-20 DIAGNOSIS — F320 Major depressive disorder, single episode, mild: Secondary | ICD-10-CM | POA: Diagnosis not present

## 2017-05-20 DIAGNOSIS — I10 Essential (primary) hypertension: Secondary | ICD-10-CM | POA: Diagnosis not present

## 2017-05-20 DIAGNOSIS — E162 Hypoglycemia, unspecified: Secondary | ICD-10-CM

## 2017-05-20 DIAGNOSIS — W19XXXA Unspecified fall, initial encounter: Secondary | ICD-10-CM

## 2017-05-20 MED ORDER — AMLODIPINE BESYLATE 2.5 MG PO TABS: 2.5000 mg | ORAL_TABLET | Freq: Every day | ORAL | 1 refills | Status: DC

## 2017-05-20 MED ORDER — SERTRALINE HCL 25 MG PO TABS: 25.0000 mg | ORAL_TABLET | Freq: Every day | ORAL | 2 refills | Status: DC

## 2017-05-20 NOTE — Assessment & Plan Note (Signed)
Patient with symptoms of depression.  Discussed potential treatment and she is willing to start medication.  Will start on Zoloft at a low dose.  Discussed this may take 4 to 8 weeks to really make a difference.  She will keep her appointment with me in June.  Given return precautions.

## 2017-05-20 NOTE — Progress Notes (Signed)
[name], MD Phone: [phone]  Jessica [name] is a 84 y.o. female who presents today for hospital follow-up.  Patient was hospitalized from 04/04/2017-04/06/2017 for acute encephalopathy secondary to hypoglycemia and dehydration.  The patient's family member that presents with her today states she had not been eating very well as she had been depressed after falling several months ago.  She had had difficulty getting around and stopped eating and drinking well.  She has done well since getting discharged from the hospital.  She has been eating 3 meals a day.  She likes to eat vegetables.  She notes no additional confusion.  She did have some hip discomfort following her prior fall though that has improved and she had an extensive evaluation with imaging in the ED.  She did follow-up with orthopedics as well.  Continues on physical therapy.  She is currently living with her family and that has been a big adjustment.  She does note some continued depression related to this adjustment as well as her approaching the end of her life.  She reports she thinks about death quite a bit.  She has no SI.  She has no anxiety.  She would be interested in treatment for this.  She also notes they added a blood pressure medication.  She notes no chest pain or shortness of breath.  She has had some lightheadedness when she gets up from laying down in the morning.  That started after the amlodipine was added.  Social History   Tobacco Use  Smoking Status Former Smoker  . Packs/day: 1.50  . Years: 50.00  . Pack years: 75.00  . Types: Cigarettes  . Last attempt to quit: 10/03/1995  . Years since quitting: 21.6  Smokeless Tobacco Never Used     ROS see history of present illness  Objective  Physical Exam Vitals:   05/20/17 1114  BP: 122/60  Pulse: 70  Temp: (!) 97.4 F (36.3 C)  SpO2: 94%   Laying blood pressure 158/90 pulse 70 Sitting blood pressure 150/88 pulse 70 Standing blood pressure  142/84 pulse 70  BP Readings from Last 3 Encounters:  05/20/17 122/60  04/06/17 (!) 172/65  03/28/17 (!) 164/72   Wt Readings from Last 3 Encounters:  05/20/17 121 lb 3.2 oz (55 kg)  04/06/17 121 lb 14.6 oz (55.3 kg)  03/28/17 132 lb (59.9 kg)    Physical Exam  Constitutional: No distress.  Cardiovascular: Normal rate and regular rhythm.  Murmur (2/6 systolic murmur) heard. Pulmonary/Chest: Effort normal and breath sounds normal.  Musculoskeletal: She exhibits no edema.  Neurological: She is alert.  Skin: Skin is warm and dry. She is not diaphoretic.     Assessment/Plan: Please see individual problem list.  HYPERTENSION, BENIGN Low normal on initial check though in a more acceptable range on orthostatic check.  Given lightheadedness after starting amlodipine we will decrease the dose to 2.5 mg.  They will check daily for the next week and return for BP check with nursing.  Hypoglycemia Related to poor p.o. intake.  She has been eating better and has had no additional issues.  I encouraged 3 meals a day.  Depression, major, single episode, mild ([place]) Patient with symptoms of depression.  Discussed potential treatment and she is willing to start medication.  Will start on Zoloft at a low dose.  Discussed this may take 4 to 8 weeks to really make a difference.  She will keep her appointment with me in June.  Given return  precautions.  Fall Patient with prior fall.  She was evaluated in the ED.  Possible lucency in left wrist on x-ray though no other fractures noted.  She has followed up with orthopedics.  She will walk with a walker.  She will continue physical therapy.  No orders of the defined types were placed in this encounter.   Meds ordered this encounter  Medications  . amLODipine (NORVASC) 2.5 MG tablet    Sig: Take 1 tablet (2.5 mg total) by mouth daily.    Dispense:  90 tablet    Refill:  1  . sertraline (ZOLOFT) 25 MG tablet    Sig: Take 1 tablet (25 mg total)  by mouth daily.    Dispense:  30 tablet    Refill:  2     [name], MD [place]

## 2017-05-20 NOTE — Assessment & Plan Note (Signed)
Related to poor p.o. intake.  She has been eating better and has had no additional issues.  I encouraged 3 meals a day.

## 2017-05-20 NOTE — Patient Instructions (Signed)
Nice to see you. Please continue with physical therapy. We will decrease your amlodipine dose to 2.5 mg daily.  Please check your blood pressure daily for the next week and we will have you return for a blood pressure check. We will start you on Zoloft for your depression.  If your depression worsens please let us know.  If you develop thoughts of harming yourself please go to the emergency room immediately. Please continue to eat 3 good meals per day.

## 2017-05-20 NOTE — Assessment & Plan Note (Signed)
Low normal on initial check though in a more acceptable range on orthostatic check.  Given lightheadedness after starting amlodipine we will decrease the dose to 2.5 mg.  They will check daily for the next week and return for BP check with nursing.

## 2017-05-20 NOTE — Assessment & Plan Note (Signed)
Patient with prior fall.  She was evaluated in the ED.  Possible lucency in left wrist on x-ray though no other fractures noted.  She has followed up with orthopedics.  She will walk with a walker.  She will continue physical therapy.

## 2017-05-27 ENCOUNTER — Ambulatory Visit (INDEPENDENT_AMBULATORY_CARE_PROVIDER_SITE_OTHER): Payer: PPO | Admitting: Family Medicine

## 2017-05-27 ENCOUNTER — Encounter: Payer: Self-pay | Admitting: Family Medicine

## 2017-05-27 DIAGNOSIS — I10 Essential (primary) hypertension: Secondary | ICD-10-CM

## 2017-05-27 NOTE — Progress Notes (Signed)
  [name], MD Phone: [phone]  Jessica [name] is a 84 y.o. female who presents today for same-day visit.  Patient presents for blood pressure check.  She has continued to feel lightheaded following a decrease in her amlodipine dose.  This was started due to elevated blood pressure while in the hospital.  She can get lightheaded when going from laying to standing or it can occur when she has been walking.  She does drink a fair amount of green tea right before bed and is up and down all night urinating.  She continues on atenolol as well as amlodipine 2.5 mg daily.  She notes no chest pain or shortness of breath.  No syncope.  Social History   Tobacco Use  Smoking Status Former Smoker  . Packs/day: 1.50  . Years: 50.00  . Pack years: 75.00  . Types: Cigarettes  . Last attempt to quit: 10/03/1995  . Years since quitting: 21.6  Smokeless Tobacco Never Used     ROS see history of present illness  Objective  Physical Exam Vitals:   05/27/17 0950  BP: 110/74  Pulse: 63  Resp: 16  Temp: (!) 97.5 F (36.4 C)  SpO2: 94%   Laying blood pressure 140/78 pulse 67 Sitting blood pressure 120/74 pulse 62 Standing blood pressure 98/64 pulse 63  BP Readings from Last 3 Encounters:  05/27/17 110/74  05/20/17 122/60  04/06/17 (!) 172/65   Wt Readings from Last 3 Encounters:  05/20/17 121 lb 3.2 oz (55 kg)  04/06/17 121 lb 14.6 oz (55.3 kg)  03/28/17 132 lb (59.9 kg)    Physical Exam  Constitutional: No distress.  Cardiovascular: Normal rate, regular rhythm and normal heart sounds.  Pulmonary/Chest: Effort normal and breath sounds normal.  Musculoskeletal: She exhibits no edema.  Skin: She is not diaphoretic.     Assessment/Plan: Please see individual problem list.  HYPERTENSION, BENIGN Continues to have issues with lightheadedness after the addition of amlodipine during her hospitalization.  Orthostatic on exam today.  Will discontinue amlodipine.  She will return  in 1 week for recheck.  Discussed rising slowly from seated position.  Advised to contact us in the next several days if the lightheadedness persists.  She will stop drinking green tea right before bed.  She will increase her fluid intake.  No orders of the defined types were placed in this encounter.   No orders of the defined types were placed in this encounter.    [name], MD [place]

## 2017-05-27 NOTE — Patient Instructions (Signed)
Nice to see you. Please discontinue the amlodipine.  We will have you return in 1 week for nurse visit for blood pressure check.  Please stop drinking green tea at night.  Please try to drink more liquids.

## 2017-05-27 NOTE — Addendum Note (Signed)
Addended by: Caryl Bis, Nicholai Willette G on: 05/27/2017 10:07 AM   Modules accepted: Orders

## 2017-05-27 NOTE — Assessment & Plan Note (Signed)
Continues to have issues with lightheadedness after the addition of amlodipine during her hospitalization.  Orthostatic on exam today.  Will discontinue amlodipine.  She will return in 1 week for recheck.  Discussed rising slowly from seated position.  Advised to contact us in the next several days if the lightheadedness persists.  She will stop drinking green tea right before bed.  She will increase her fluid intake.

## 2017-06-04 ENCOUNTER — Ambulatory Visit: Payer: PPO | Admitting: Internal Medicine

## 2017-06-04 ENCOUNTER — Encounter: Payer: Self-pay | Admitting: Internal Medicine

## 2017-06-04 VITALS — BP 178/96 | HR 70 | Ht 65.0 in | Wt 120.0 lb

## 2017-06-04 DIAGNOSIS — I951 Orthostatic hypotension: Secondary | ICD-10-CM | POA: Diagnosis not present

## 2017-06-04 DIAGNOSIS — I442 Atrioventricular block, complete: Secondary | ICD-10-CM

## 2017-06-04 DIAGNOSIS — R0600 Dyspnea, unspecified: Secondary | ICD-10-CM

## 2017-06-04 DIAGNOSIS — I4819 Other persistent atrial fibrillation: Secondary | ICD-10-CM

## 2017-06-04 DIAGNOSIS — I481 Persistent atrial fibrillation: Secondary | ICD-10-CM | POA: Diagnosis not present

## 2017-06-04 DIAGNOSIS — R0609 Other forms of dyspnea: Secondary | ICD-10-CM | POA: Diagnosis not present

## 2017-06-04 DIAGNOSIS — Z950 Presence of cardiac pacemaker: Secondary | ICD-10-CM | POA: Diagnosis not present

## 2017-06-04 NOTE — Progress Notes (Signed)
Patient Care Team: Leone Haven, MD as PCP - General (Family Medicine) Deboraha Sprang, MD (Cardiology) Minna Merritts, MD (Cardiology)   HPI  Jessica [name] is a 84 y.o. female Seen in follow-up for a pacemaker implanted for complete heart block; she has persistent Afib.    Device History: Pacemaker  implanted 1980  for CHB Changed out 2009. 2019   Date Lead Status   1980 CPI 0505 Abandoned   2009 A 5076   2009 V 1688     We have in the past discussed NOACs. She has elected to stay on warfarin .    Thromboembolic risk factors ( age  -2, HTN-1, TIA/CVA-2, Vasc disease -1, Gender-1) for a CHADSVASc Score of 7    DATE TEST    7/18 Myoview   EF 65 % No ischemia  8//18 Echo    EF 65 % Mod LVH        Date Cr K Hgb  7/17 1.36 4.0   5/18     14.4  3/19  1.58 3.3-->5.0 14.3-->11.9   She was admitted 3/19 with a discharge diagnosis of hypoglycemia and dehydration with secondary encephalopathy  The patient denies chest pain, shortness of breath, nocturnal dyspnea, orthopnea or peripheral edema.  There have been no palpitations or syncope. She has positional lightheadedness and has had some falls  No problems with pacer replacement wound healing   On Anticoagulation;  No bleeding issues    Past Medical History:  Diagnosis Date  . AAA (abdominal aortic aneurysm) ([place])   . Abdominal aneurysm without mention of rupture   . Atrioventricular block, complete ([place])   . Cardiac pacemaker in situ   . Gout   . Hernia   . Hyperlipidemia   . Hypertension     Past Surgical History:  Procedure Laterality Date  . ABDOMINAL AORTIC ANEURYSM REPAIR    . HERNIA REPAIR    . ohter     growth on colon surgery  . PPM GENERATOR CHANGEOUT N/A 02/25/2017   Procedure: PPM GENERATOR CHANGEOUT;  Surgeon: Deboraha Sprang, MD;  Location: [facility];  Service: Cardiovascular;  Laterality: N/A;  . TONSILLECTOMY      Current Outpatient Medications  Medication Sig  Dispense Refill  . acetaminophen (TYLENOL) 500 MG tablet Take 500 mg by mouth every 6 (six) hours as needed for moderate pain or headache.    . albuterol (PROAIR HFA) 108 (90 Base) MCG/ACT inhaler Inhale 2 puffs into the lungs every 6 (six) hours as needed for wheezing or shortness of breath. 1 Inhaler 5  . apixaban (ELIQUIS) 2.5 MG TABS tablet Take 1 tablet (2.5 mg total) by mouth 2 (two) times daily. 60 tablet 6  . atenolol (TENORMIN) 25 MG tablet TAKE 1 TABLET BY MOUTH EVERY DAY. 180 tablet 1  . Cholecalciferol (VITAMIN D3 PO) Take 1 capsule by mouth daily.    . Cyanocobalamin (VITAMIN B-12 PO) Take 1 tablet by mouth daily.    . polyethylene glycol (MIRALAX / GLYCOLAX) packet Take 17 g by mouth daily as needed for mild constipation. 14 each 0  . SPIRIVA HANDIHALER 18 MCG inhalation capsule INHALE ONE CAPSULE AS DIRECTED ONCE A DAY 60 capsule 10   No current facility-administered medications for this visit.     Allergies  Allergen Reactions  . Codeine Other (See Comments)    Made her feel crazy  . Morphine Other (See Comments)    Made her feel  crazy  . Zyrtec [Cetirizine] Other (See Comments)    Causes facial numbness    Review of Systems negative except from HPI and PMH  Physical Exam BP (!) 178/96 (BP Location: Left Arm, Patient Position: Sitting, Cuff Size: Normal)   Pulse 70   Ht 5\' 5"  (1.651 m)   Wt 120 lb (54.4 kg)   BMI 19.97 kg/m  Well developed and nourished in no acute distress HENT normal Neck supple with JVP-flat Clear Device pocket well healed; without hematoma or erythema.  There is no tethering  Regular rate and rhythm, no murmurs or gallops Abd-soft with active BS No Clubbing cyanosis edema Skin-warm and dry A & Oriented  Grossly normal sensory and motor function   ECG personally reviewed   sinus with complete heart block and P-synchronous/ AV  pacing g  Assessment and  Plan  Atrial fibrillation-persistent  Hypertension    Orthostatic hypotension    AV block-complete -intermittent   Grade V3 renal insufficiency  Peripheral vascular disease status post AAA repair  Pacemaker-[employer]     BP poorly controlled with orthostatic fall >85 mm With systolic hypertension , mestinon might be the best choice  She is to see Dr ES in the am, adding  amlodipine 5 mg may allow [place] to control systolic hypertension and using An abdominal binder in conjunction might be of non pharmacologic benefit and help avoid orthostasis    mestinon 60 bid might be of benefit although not without potential for sideeffects   We spent more than 50% of our >25 min visit in face to face counseling regarding the above

## 2017-06-04 NOTE — Patient Instructions (Addendum)
Medication Instructions: - Your physician recommends that you continue on your current medications as directed. Please refer to the Current Medication list given to you today.  Labwork: - none ordered  Procedures/Testing: - none ordered  Follow-Up: - Your physician wants you to follow-up in: 9 months with Dr. Caryl Comes. You will receive a reminder letter in the mail two months in advance. If you don't receive a letter, please call our office to schedule the follow-up appointment.   Any Additional Special Instructions Will Be Listed Below (If Applicable).     If you need a refill on your cardiac medications before your next appointment, please call your pharmacy.

## 2017-06-05 ENCOUNTER — Ambulatory Visit (INDEPENDENT_AMBULATORY_CARE_PROVIDER_SITE_OTHER): Payer: PPO | Admitting: *Deleted

## 2017-06-05 VITALS — BP 154/98 | HR 72 | Resp 18

## 2017-06-05 DIAGNOSIS — I10 Essential (primary) hypertension: Secondary | ICD-10-CM | POA: Diagnosis not present

## 2017-06-05 NOTE — Progress Notes (Addendum)
Blood pressure appears to be improved and adequately controlled for age particularly given that she is orthostatic.  Blood pressure at home ranges from 116-144/58-85.  I spoke with the patient while she was in the office she noted her lightheadedness has gotten better with discontinuing the amlodipine.  The LPN noted the patient reported left scalp tingling as though it had gone to sleep when she went from laying to seated and this resolved quickly.  There were no residual symptoms.  I suspect this was positional nerve impingement.  Her neurological exam is as follows.  She will continue with her current regimen.  She should follow-up with me in 3 months.  She should continue to monitor her blood pressure at home.   CN 2-12 intact, 5/5 strength in bilateral biceps, triceps, grip, quads, hamstrings, plantar and dorsiflexion, sensation to light touch intact in bilateral UE and LE

## 2017-06-05 NOTE — Progress Notes (Signed)
Orthostatics taken see nurse visit.

## 2017-06-05 NOTE — Progress Notes (Signed)
Patient in for follow up on BP after stopping amlodipine due to orthostatic hypotension.  Patient BP to day in left arm 150/90 pule 72 , BP taken in right arm 15 minutes later 154/98 pulse 72. PCP requested Ortho static pressures on patient Lying BP 140/84 pulse 64, sitting BP 148/94 pulse 70 , standing BP 120/82 pulse 77, standing 3 minutes 128/84 pulse 76 all BP taken on left arm for orthostatics

## 2017-06-06 NOTE — Progress Notes (Signed)
Patient DPR notified and voiced understanding has foolwo up scheduled for August.

## 2017-07-04 ENCOUNTER — Ambulatory Visit: Payer: Self-pay | Admitting: Family Medicine

## 2017-07-04 ENCOUNTER — Ambulatory Visit: Payer: Self-pay

## 2017-07-12 ENCOUNTER — Other Ambulatory Visit: Payer: Self-pay | Admitting: Cardiovascular Disease

## 2017-07-12 DIAGNOSIS — I4891 Unspecified atrial fibrillation: Secondary | ICD-10-CM

## 2017-07-12 DIAGNOSIS — I442 Atrioventricular block, complete: Secondary | ICD-10-CM

## 2017-07-12 DIAGNOSIS — I10 Essential (primary) hypertension: Secondary | ICD-10-CM

## 2017-07-17 LAB — CUP PACEART INCLINIC DEVICE CHECK
Date Time Interrogation Session: 20190626103202
Implantable Lead Implant Date: 20090610
Implantable Lead Implant Date: 20090610
Implantable Lead Location: 753859
Implantable Lead Location: 753860
Implantable Lead Model: 5076
Implantable Pulse Generator Implant Date: 20190204
Pulse Gen Model: 2272
Pulse Gen Serial Number: 8991929

## 2017-07-28 NOTE — Progress Notes (Signed)
Cardiology Office Note  Date:  07/31/2017   ID:  Jessica [name], DOB May 09, 1929, MRN [id number]  PCP:  Jessica Haven, MD   Chief Complaint  Patient presents with  . other    6 month follow up. Meds reviewed by the pt. verbally. Pt. c/o not feeling well since her fall in Feb. 2019.     HPI:  Jessica [name] is a very pleasant 84 year old woman with a history of  Chronic atrial fib COPD, 50 years of smoking. peripheral vascular disease,  S/p open abdominal aortic aneurysm repair in 2009,  long smoking history who stopped >15 years ago ,  complete heart block status post pacemaker implant,  herna repair in 2009 with post op atrial fib,  HTN,  prior TIA per the notes,  carotid u/s showing mild carotid disease in 2011 with atypical vessel on the left contributing to her bruit,  previous pains in her neck radiating to her arms bilaterally occurring at night and awakening her from sleep, relieved by repositioning herself in bed.  Renal artery stenosis 60% bilaterally, followed by vascular surgery She presents for routine follow up of her PAD  Moved [place] the country, with son,  Feels isolated  Had fall in 02/2017 seen in the ER  In the hospital 03/2017: UTI, dehydration, delerium Glucose 21  Hx of orthostasis,er the notes from Jessica [name], he reports 20 point drop Does not drink fluids  Blood pressure today on my check 756 systolic Was 433 with Jessica [name] 2 months ago She is to have visiting nurse check her blood pressure [place]  Denies any chest pain concerning for angina No regular exercise Takes lasix as needed. Denies shortness of breath or leg swelling  Stopped crestor 5 mg daily on her own 2018   Previously with symptoms of SOB with exertion, taking spiriva, albuterol  EKG personally reviewed by myself on todays visit showspaced rhythm 70 bpm the underlying fibrillation flutter  Other past medical history  reviewed ER August 12 2015 CT scan results as below showing significant stenosis of left renal artery after aneurysm Left flank pain has resolved, she was instructed to see vascular surgery in follow-up Jessica [name]  CT ABD: 1. Normally patent aorto bi-iliac bypass graft. 2. 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 There is associated significant stenosis of the renal artery just along the distal margin of the aneurysm causing visible decrease in flow to the left kidney compared to the right without infarction or significant renal atrophy. 3. Approximately 50- 70% moderate stenosis of the proximal inferior right renal artery. A small superior accessory right renal artery supplies the upper pole. 4. No significant occlusive disease of the celiac axis or superior mesenteric artery.  Last Myoview October 2012 showing no ischemia  PMH:   has a past medical history of AAA (abdominal aortic aneurysm) ([place]), Abdominal aneurysm without mention of rupture, Atrioventricular block, complete [facility]), Cardiac pacemaker in situ, Gout, Hernia, Hyperlipidemia, and Hypertension.  PSH:    Past Surgical History:  Procedure Laterality Date  . ABDOMINAL AORTIC ANEURYSM REPAIR    . HERNIA REPAIR    . ohter     growth on colon surgery  . PPM GENERATOR CHANGEOUT N/A 02/25/2017   Procedure: PPM GENERATOR CHANGEOUT;  Surgeon: Jessica Sprang, MD;  Location: [facility];  Service: Cardiovascular;  Laterality: N/A;  . TONSILLECTOMY  Current Outpatient Medications  Medication Sig Dispense Refill  . acetaminophen (TYLENOL) 500 MG tablet Take 500 mg by mouth every 6 (six) hours as needed for moderate pain or headache.    . albuterol (PROAIR HFA) 108 (90 Base) MCG/ACT inhaler Inhale 2 puffs into the lungs every 6 (six) hours as needed for wheezing or shortness of breath. 1 Inhaler 5  . apixaban (ELIQUIS) 2.5 MG TABS tablet Take 1 tablet (2.5 mg total) by mouth 2  (two) times daily. 60 tablet 6  . atenolol (TENORMIN) 25 MG tablet TAKE 1 TABLET BY MOUTH EVERY DAY. 30 tablet 0  . Cholecalciferol (VITAMIN D3 PO) Take 1 capsule by mouth daily.    . Cyanocobalamin (VITAMIN B-12 PO) Take 1 tablet by mouth daily.    . polyethylene glycol (MIRALAX / GLYCOLAX) packet Take 17 g by mouth daily as needed for mild constipation. 14 each 0  . SPIRIVA HANDIHALER 18 MCG inhalation capsule INHALE ONE CAPSULE AS DIRECTED ONCE A DAY 60 capsule 10   No current facility-administered medications for this visit.      Allergies:   Codeine; Morphine; and Zyrtec [cetirizine]   Social History:  The patient  reports that she quit smoking about 21 years ago. Her smoking use included cigarettes. She has a 75.00 pack-year smoking history. She has never used smokeless tobacco. She reports that she drinks about 0.6 oz of alcohol per week. She reports that she does not use drugs.   Family History:   family history includes Alcohol abuse in her father; Cancer in her brother; Heart disease in her father.    Review of Systems: Review of Systems  Constitutional: Negative.   Respiratory: Negative.   Cardiovascular: Negative.   Gastrointestinal: Negative.   Musculoskeletal: Positive for falls.       Gait instability  Neurological: Negative.   Psychiatric/Behavioral: Positive for memory loss.  All other systems reviewed and are negative.    PHYSICAL EXAM: VS:  BP (!) 160/94 (BP Location: Left Arm, Patient Position: Sitting, Cuff Size: Normal)   Pulse 69   Ht 5\' 5"  (1.651 m)   Wt 117 lb 8 oz (53.3 kg)   BMI 19.55 kg/m  , BMI Body mass index is 19.55 kg/m. blood pressure on my check 888 systolic over 280 No change from previous exam Constitutional:  oriented to person, place, and time. No distress.  HENT:  Head: Normocephalic and atraumatic.  Eyes:  no discharge. No scleral icterus.  Neck: Normal range of motion. Neck supple. No JVD present.  Cardiovascular: Normal rate,  regular rhythm, normal heart sounds and intact distal pulses. Exam reveals no gallop and no friction rub. No edema No murmur heard. Pulmonary/Chest: Effort normal and breath sounds normal. No stridor. No respiratory distress.  no wheezes.  no rales.  no tenderness.  Abdominal: Soft.  no distension.  no tenderness.  Musculoskeletal: Normal range of motion.  no  tenderness or deformity.  Neurological:  normal muscle tone. Coordination normal. No atrophy Skin: Skin is warm and dry. No rash noted. not diaphoretic.  Psychiatric:  normal mood and affect. behavior is normal. Thought content normal.    Recent Labs: 08/14/2016: BNP 177.0 04/04/2017: ALT 8 04/05/2017: Magnesium 1.5 04/06/2017: BUN 24; Creatinine, Ser 1.34; Hemoglobin 11.9; Platelets 163; Potassium 5.0; Sodium 143    Lipid Panel Lab Results  Component Value Date   CHOL 148 01/30/2013   HDL 46.30 01/30/2013   LDLCALC 78 01/30/2013   TRIG 117.0 01/30/2013  Wt Readings from Last 3 Encounters:  07/31/17 117 lb 8 oz (53.3 kg)  06/04/17 120 lb (54.4 kg)  05/20/17 121 lb 3.2 oz (55 kg)       ASSESSMENT AND PLAN:  Atrial fibrillation, unspecified type ([place]) - Plan: EKG 12-Lead Chronic atrial fibrillation, tolerating eliquis Recent fall with no injury Pacemaker in place  HYPERTENSION, BENIGN Blood pressure continues to be markedly elevated We'll add amlodipine 5 mg daily Notes/instructions written for family to check blood pressure sitting and standing and call our office  Bilateral carotid artery stenosis Mild disease in the past We have recommended she restart Crestor 5 mg daily as she was taking in the past  Mixed hyperlipidemia Given diffuse PAD, recommended low-dose cholesterol medication We have sent a new prescription for Crestor 5 mg daily  Atherosclerosis of aorto-iliac bypass graft ([place]) Followed by vascular surgery Followed by periodic ultrasound  COPD exacerbation (HCC) Previous COPD exacerbation,    improved symptoms on prednisone and antibiotics.  No recent COPD exacerbation  [facility] Followed by Jessica [name]   Total encounter time more than 25 minutes  Greater than 50% was spent in counseling and coordination of care with the patient   Disposition:   F/U  6 months   Orders Placed This Encounter  Procedures  . EKG 12-Lead     Signed, Jessica [name], M.D., Ph.D. 07/31/2017  [place], [place]

## 2017-07-31 ENCOUNTER — Encounter: Payer: Self-pay | Admitting: Cardiovascular Disease

## 2017-07-31 ENCOUNTER — Other Ambulatory Visit: Payer: Self-pay | Admitting: Cardiovascular Disease

## 2017-07-31 ENCOUNTER — Ambulatory Visit (INDEPENDENT_AMBULATORY_CARE_PROVIDER_SITE_OTHER): Payer: Medicare Other | Admitting: Cardiovascular Disease

## 2017-07-31 VITALS — BP 160/94 | HR 69 | Ht 65.0 in | Wt 117.5 lb

## 2017-07-31 DIAGNOSIS — I714 Abdominal aortic aneurysm, without rupture, unspecified: Secondary | ICD-10-CM

## 2017-07-31 DIAGNOSIS — I10 Essential (primary) hypertension: Secondary | ICD-10-CM

## 2017-07-31 DIAGNOSIS — R0600 Dyspnea, unspecified: Secondary | ICD-10-CM

## 2017-07-31 DIAGNOSIS — I442 Atrioventricular block, complete: Secondary | ICD-10-CM

## 2017-07-31 DIAGNOSIS — Z950 Presence of cardiac pacemaker: Secondary | ICD-10-CM

## 2017-07-31 DIAGNOSIS — I722 Aneurysm of renal artery: Secondary | ICD-10-CM

## 2017-07-31 DIAGNOSIS — I701 Atherosclerosis of renal artery: Secondary | ICD-10-CM

## 2017-07-31 DIAGNOSIS — I481 Persistent atrial fibrillation: Secondary | ICD-10-CM | POA: Diagnosis not present

## 2017-07-31 DIAGNOSIS — I4891 Unspecified atrial fibrillation: Secondary | ICD-10-CM

## 2017-07-31 DIAGNOSIS — R0609 Other forms of dyspnea: Secondary | ICD-10-CM

## 2017-07-31 DIAGNOSIS — I4819 Other persistent atrial fibrillation: Secondary | ICD-10-CM

## 2017-07-31 MED ORDER — ROSUVASTATIN CALCIUM 5 MG PO TABS: 5.0000 mg | ORAL_TABLET | Freq: Every day | ORAL | 3 refills | Status: DC

## 2017-07-31 MED ORDER — AMLODIPINE BESYLATE 5 MG PO TABS: 5.0000 mg | ORAL_TABLET | Freq: Every day | ORAL | 3 refills | Status: DC

## 2017-07-31 MED ORDER — ATENOLOL 25 MG PO TABS: 25.0000 mg | ORAL_TABLET | Freq: Every day | ORAL | 3 refills | Status: DC

## 2017-07-31 NOTE — Addendum Note (Signed)
Addended byAlvis Lemmings C on: 07/31/2017 10:29 AM   Modules accepted: Orders

## 2017-07-31 NOTE — Patient Instructions (Addendum)
Please call with blood pressure numbers,  Sitting and standing   Medication Instructions:   Please restart crestor 5 mg daily  Please start amlodipine 5 mg daily  Labwork:  No new labs needed  Testing/Procedures:  No further testing at this time   Follow-Up: It was a pleasure seeing you in the office today. Please call us if you have new issues that need to be addressed before your next appt.  [phone]  Your physician wants you to follow-up in: 12 months.  You will receive a reminder letter in the mail two months in advance. If you don't receive a letter, please call our office to schedule the follow-up appointment.  If you need a refill on your cardiac medications before your next appointment, please call your pharmacy.  For educational health videos Log in to : [email or web address] Or : [email or web address], password : [email or web address]

## 2017-08-05 ENCOUNTER — Telehealth: Payer: Self-pay | Admitting: Cardiovascular Disease

## 2017-08-05 NOTE — Telephone Encounter (Signed)
Pt calling in her BP readings  08/01/17 Sitting 139/70 Standing 143/72  08/02/17 Sitting 138/76 Standing 137/78  08/03/17  Sitting 134/75  Standing 149/83  08/04/17 Sitting 132/68 Standing 126/74  08/05/17 Sitting 140/74 Standing 142/70  Please call back if we have any concerns

## 2017-08-05 NOTE — Telephone Encounter (Signed)
Patient was asked to call with BP's sitting and standing at her last appt on 07/31/17. Routing to Dr Rockey Situ to review.

## 2017-08-06 NOTE — Telephone Encounter (Signed)
blood pressures look great Would stay on her current medications

## 2017-08-07 NOTE — Telephone Encounter (Signed)
Patient verbalized understanding of Dr Donivan Scull recommendations.

## 2017-08-07 NOTE — Telephone Encounter (Signed)
No answer. Left message to call back.   

## 2017-08-29 ENCOUNTER — Ambulatory Visit (INDEPENDENT_AMBULATORY_CARE_PROVIDER_SITE_OTHER): Payer: Medicare Other | Admitting: *Deleted

## 2017-08-29 DIAGNOSIS — I442 Atrioventricular block, complete: Secondary | ICD-10-CM

## 2017-08-30 ENCOUNTER — Ambulatory Visit (INDEPENDENT_AMBULATORY_CARE_PROVIDER_SITE_OTHER): Payer: Medicare Other | Admitting: Family Medicine

## 2017-08-30 ENCOUNTER — Encounter: Payer: Self-pay | Admitting: Family Medicine

## 2017-08-30 VITALS — BP 130/82 | HR 70 | Temp 97.5°F | Ht 65.0 in | Wt 117.2 lb

## 2017-08-30 DIAGNOSIS — I10 Essential (primary) hypertension: Secondary | ICD-10-CM | POA: Diagnosis not present

## 2017-08-30 DIAGNOSIS — R8290 Unspecified abnormal findings in urine: Secondary | ICD-10-CM | POA: Diagnosis not present

## 2017-08-30 DIAGNOSIS — I482 Chronic atrial fibrillation, unspecified: Secondary | ICD-10-CM

## 2017-08-30 DIAGNOSIS — R413 Other amnesia: Secondary | ICD-10-CM

## 2017-08-30 DIAGNOSIS — R634 Abnormal weight loss: Secondary | ICD-10-CM

## 2017-08-30 DIAGNOSIS — E782 Mixed hyperlipidemia: Secondary | ICD-10-CM | POA: Diagnosis not present

## 2017-08-30 DIAGNOSIS — Z780 Asymptomatic menopausal state: Secondary | ICD-10-CM | POA: Diagnosis not present

## 2017-08-30 DIAGNOSIS — M545 Low back pain, unspecified: Secondary | ICD-10-CM

## 2017-08-30 DIAGNOSIS — K529 Noninfective gastroenteritis and colitis, unspecified: Secondary | ICD-10-CM

## 2017-08-30 LAB — POCT URINALYSIS DIPSTICK
Glucose, UA: NEGATIVE
Ketones, UA: NEGATIVE
Leukocytes, UA: NEGATIVE
Nitrite, UA: NEGATIVE
Protein, UA: POSITIVE — AB
Spec Grav, UA: 1.025 (ref 1.010–1.025)
Urobilinogen, UA: 0.2 E.U./dL
pH, UA: 5.5 (ref 5.0–8.0)

## 2017-08-30 LAB — LIPID PANEL
Cholesterol: 143 mg/dL (ref 0–200)
HDL: 57.8 mg/dL (ref >39.00)
LDL Cholesterol: 65 mg/dL (ref 0–99)
NonHDL: 84.73
Total CHOL/HDL Ratio: 2
Triglycerides: 98 mg/dL (ref 0.0–149.0)
VLDL: 19.6 mg/dL (ref 0.0–40.0)

## 2017-08-30 LAB — COMPREHENSIVE METABOLIC PANEL
ALT: 10 U/L (ref 0–35)
AST: 15 U/L (ref 0–37)
Albumin: 4.3 g/dL (ref 3.5–5.2)
Alkaline Phosphatase: 91 U/L (ref 39–117)
BUN: 24 mg/dL — ABNORMAL HIGH (ref 6–23)
CO2: 31 mEq/L (ref 19–32)
Calcium: 9.8 mg/dL (ref 8.4–10.5)
Chloride: 104 mEq/L (ref 96–112)
Creatinine, Ser: 1.4 mg/dL — ABNORMAL HIGH (ref 0.40–1.20)
GFR: 37.87 mL/min — ABNORMAL LOW (ref >60.00)
Glucose, Bld: 97 mg/dL (ref 70–99)
Potassium: 4.2 mEq/L (ref 3.5–5.1)
Sodium: 142 mEq/L (ref 135–145)
Total Bilirubin: 0.9 mg/dL (ref 0.2–1.2)
Total Protein: 6.7 g/dL (ref 6.0–8.3)

## 2017-08-30 LAB — TSH: TSH: 0.64 u[IU]/mL (ref 0.35–4.50)

## 2017-08-30 LAB — URINALYSIS, MICROSCOPIC ONLY

## 2017-08-30 LAB — SEDIMENTATION RATE: Sed Rate: 22 mm/hr (ref 0–30)

## 2017-08-30 NOTE — Patient Instructions (Addendum)
Nice to see you. We will get you set up with a bone density scan and contact you with your lab results. Please monitor your back and if it does not continue to improve over the next several weeks please let us know. We will check lab work today and contact you with the results.  Please try to eat 3 good meals per day.

## 2017-08-30 NOTE — Assessment & Plan Note (Signed)
Adequately controlled.  Continue current regimen. 

## 2017-08-30 NOTE — Assessment & Plan Note (Signed)
I suspect muscular strain given exam and improvement.  She will continue to monitor and can use heat.  Discussed being careful with heat and avoiding using it for too long given risk of burns.  If not continue to improve she will let us know.

## 2017-08-30 NOTE — Progress Notes (Signed)
[name], MD Phone: [phone]  Jessica [name] is a 84 y.o. female who presents today for f/u.  CC: htn, hld, chronic afib, low back pain, memory difficulty  HYPERTENSION  Disease Monitoring  Home BP Monitoring 120s-130s Chest pain- no    Dyspnea- no Medications  Compliance-  Taking amlodipine, atenolol.  Edema- no  HYPERLIPIDEMIA Symptoms Chest pain on exertion:  no    Medications: Compliance- taking crestor Right upper quadrant pain- no  Muscle aches- no  Chronic A. fib: Has a pacemaker in place.  Follows with cardiology.  No palpitations.  No bleeding issues.  She is on Eliquis.  Low back pain: She notes 2 weeks ago she turned the wrong way and set her back off.  Notes going from seated to standing or twisting the wrong way will bother her.  It is improving.  No radiation, numbness, weakness, incontinence, or saddle anesthesia.  Tylenol is beneficial.  Memory difficulty: Patient reports some issues with remembering names.  She notes no other memory issues.  No ADL issues.  Weight loss: Weight loss noted on vital signs.  She reports she has progressively been losing weight for some time.  It looks like fairly drastic weight loss about 4 months based on review of her trends.  It looks like she is been relatively stable over the last 4 months.  She notes some intermittent constipation and intermittent diarrhea this been going on for some time.  Her son reports this may be related to the patient taking laxatives and then taking something to stop her self up when she goes to far with the laxatives.  She notes no abdominal discomfort.  She has had no night sweats or itching.  No hemoptysis or cough.  No depression or anxiety.  Does report she does not have much of an appetite.  No early satiety.  No melena.  No blood in her stool.  Colonoscopy was up-to-date.  Appears to have had a possible cystic lesion at the tip of her appendix previously.  Appears to have had a partial right  hemicolectomy previously based on review of imaging.  Social History   Tobacco Use  Smoking Status Former Smoker  . Packs/day: 1.50  . Years: 50.00  . Pack years: 75.00  . Types: Cigarettes  . Last attempt to quit: 10/03/1995  . Years since quitting: 21.9  Smokeless Tobacco Never Used     ROS see history of present illness  Objective  Physical Exam Vitals:   08/30/17 0948  BP: 130/82  Pulse: 70  Temp: (!) 97.5 F (36.4 C)  SpO2: 95%    BP Readings from Last 3 Encounters:  08/30/17 130/82  07/31/17 (!) 160/94  06/05/17 (!) 154/98   Wt Readings from Last 3 Encounters:  08/30/17 117 lb 3.2 oz (53.2 kg)  07/31/17 117 lb 8 oz (53.3 kg)  06/04/17 120 lb (54.4 kg)    Physical Exam  Constitutional: No distress.  HENT:  Head: Normocephalic and atraumatic.  Neck: Neck supple.  Cardiovascular: Normal rate, regular rhythm and normal heart sounds.  Pulmonary/Chest: Effort normal and breath sounds normal.  Abdominal: Soft. Bowel sounds are normal. She exhibits no distension. There is no tenderness.  Musculoskeletal: She exhibits no edema.  Midline spine tenderness, no midline spine step-off, bilateral lumbar muscular back tenderness with no overlying skin changes, 5/5 strength bilateral quads, hamstrings, plantar flexion, and dorsiflexion, sensation light touch intact bilateral lower extremities  Lymphadenopathy:    She has no cervical adenopathy.  She has no axillary adenopathy.       Right: No supraclavicular adenopathy present.       Left: No supraclavicular adenopathy present.  Neurological: She is alert.  Skin: Skin is warm and dry. She is not diaphoretic.     Assessment/Plan: Please see individual problem list.  HYPERTENSION, BENIGN Adequately controlled.  Continue current regimen.  Atrial fibrillation ([place]) Continue to follow with cardiology.  Continue current regimen.  Hyperlipidemia Check lipid panel.  Continue Crestor.  Low back pain I suspect  muscular strain given exam and improvement.  She will continue to monitor and can use heat.  Discussed being careful with heat and avoiding using it for too long given risk of burns.  If not continue to improve she will let us know.  Weight loss Noted on vitals.  She does have chronic intermittent diarrhea which could be playing a role.  Will refer to GI for that.  We will obtain lab work as outlined below.  Given the amount of weight loss recently may need to consider imaging to evaluate for a cause depending on lab results. F/u 1 month for recheck of weight. Encouraged adequate diet.   Memory difficulties Memory issues for names.  She passed the mini cog testing with 5/5 score.  She will monitor.   Orders Placed This Encounter  Procedures  . DG Bone Density    Standing Status:   Future    Standing Expiration Date:   10/31/2018    Order Specific Question:   Reason for Exam (SYMPTOM  OR DIAGNOSIS REQUIRED)    Answer:   Postmenopausal estrogen deficiency, osteoporosis screening    Order Specific Question:   Preferred imaging location?    Answer:   [facility]  . Lipid panel  . Comp Met (CMET)  . TSH  . Sedimentation rate  . Ambulatory referral to Gastroenterology    Referral Priority:   Routine    Referral Type:   Consultation    Referral Reason:   Specialty Services Required    Number of Visits Requested:   1  . POCT Urinalysis Dipstick    No orders of the defined types were placed in this encounter.    [name], MD [place]

## 2017-08-30 NOTE — Assessment & Plan Note (Signed)
Check lipid panel.  Continue Crestor.

## 2017-08-30 NOTE — Assessment & Plan Note (Signed)
Continue to follow with cardiology.  Continue current regimen.

## 2017-08-30 NOTE — Progress Notes (Signed)
Remote pacemaker transmission.   

## 2017-08-30 NOTE — Assessment & Plan Note (Signed)
Memory issues for names.  She passed the mini cog testing with 5/5 score.  She will monitor.

## 2017-08-30 NOTE — Assessment & Plan Note (Addendum)
Noted on vitals.  She does have chronic intermittent diarrhea which could be playing a role.  Will refer to GI for that.  We will obtain lab work as outlined below.  Given the amount of weight loss recently may need to consider imaging to evaluate for a cause depending on lab results. F/u 1 month for recheck of weight. Encouraged adequate diet.

## 2017-09-02 ENCOUNTER — Other Ambulatory Visit: Payer: Self-pay | Admitting: Family Medicine

## 2017-09-02 DIAGNOSIS — R3129 Other microscopic hematuria: Secondary | ICD-10-CM

## 2017-09-02 DIAGNOSIS — R809 Proteinuria, unspecified: Secondary | ICD-10-CM

## 2017-09-12 ENCOUNTER — Ambulatory Visit: Payer: Medicare Other | Admitting: Gastroenterology

## 2017-09-12 ENCOUNTER — Encounter: Payer: Self-pay | Admitting: Gastroenterology

## 2017-09-12 VITALS — BP 152/74 | HR 70 | Ht 65.0 in | Wt 120.0 lb

## 2017-09-12 DIAGNOSIS — R197 Diarrhea, unspecified: Secondary | ICD-10-CM | POA: Diagnosis not present

## 2017-09-12 NOTE — Progress Notes (Signed)
Jonathon Bellows MD, MRCP(U.K) [address]  [place]  [place], [place] [address]  Main: [phone]  Fax: [phone]   Gastroenterology Consultation  Referring Provider:     Leone Haven, MD Primary Care Physician:  Leone Haven, MD Primary Gastroenterologist:  Dr. Jonathon Bellows  Reason for Consultation:     Diarrhea         HPI:   SORAIYA AHNER is a 84 y.o. y/o female referred for consultation & management  by Dr. Caryl Bis, Angela Adam, MD.     She says she has had diarrhea with constipation- few weeks back and resolved after she started taking her multi vitamins. Denies any blood in her stool. No change of weight . Apetite is good. Last colonoscopy was 13 years back and recalls was normal.  I looked at her vitamin bottle and ingredients are glucose syrup. Denies any other complaints presently.   Past Medical History:  Diagnosis Date  . AAA (abdominal aortic aneurysm) ([place])   . Abdominal aneurysm without mention of rupture   . Atrioventricular block, complete ([place])   . Cardiac pacemaker in situ   . Gout   . Hernia   . Hyperlipidemia   . Hypertension     Past Surgical History:  Procedure Laterality Date  . ABDOMINAL AORTIC ANEURYSM REPAIR    . HERNIA REPAIR    . ohter     growth on colon surgery  . PPM GENERATOR CHANGEOUT N/A 02/25/2017   Procedure: PPM GENERATOR CHANGEOUT;  Surgeon: Deboraha Sprang, MD;  Location: [facility];  Service: Cardiovascular;  Laterality: N/A;  . TONSILLECTOMY      Prior to Admission medications   Medication Sig Start Date End Date Taking? Authorizing Provider  acetaminophen (TYLENOL) 500 MG tablet Take 500 mg by mouth every 6 (six) hours as needed for moderate pain or headache.   Yes [provider]  albuterol (PROAIR HFA) 108 (90 Base) MCG/ACT inhaler Inhale 2 puffs into the lungs every 6 (six) hours as needed for wheezing or shortness of breath. 01/26/16  Yes Wilhelmina Mcardle, MD  amLODipine (NORVASC) 5 MG  tablet Take 1 tablet (5 mg total) by mouth daily. 07/31/17  Yes Gollan, Kathlene November, MD  apixaban (ELIQUIS) 2.5 MG TABS tablet Take 1 tablet (2.5 mg total) by mouth 2 (two) times daily. 02/27/17  Yes Deboraha Sprang, MD  atenolol (TENORMIN) 25 MG tablet Take 1 tablet (25 mg total) by mouth daily. 07/31/17  Yes Gollan, Kathlene November, MD  Cholecalciferol (VITAMIN D3 PO) Take 1 capsule by mouth daily.   Yes [provider]  Cyanocobalamin (VITAMIN B-12 PO) Take 1 tablet by mouth daily.   Yes [provider]  rosuvastatin (CRESTOR) 5 MG tablet Take 1 tablet (5 mg total) by mouth daily. 07/31/17  Yes Minna Merritts, MD  SPIRIVA HANDIHALER 18 MCG inhalation capsule INHALE ONE CAPSULE AS DIRECTED ONCE A DAY 02/14/17  Yes Wilhelmina Mcardle, MD  polyethylene glycol (MIRALAX / GLYCOLAX) packet Take 17 g by mouth daily as needed for mild constipation. Patient not taking: Reported on 09/12/2017 04/06/17   Fritzi Mandes, MD    Family History  Problem Relation Age of Onset  . Heart disease Father   . Alcohol abuse Father   . Cancer Brother      Social History   Tobacco Use  . Smoking status: Former Smoker    Packs/day: 1.50    Years: 50.00    Pack  years: 75.00    Types: Cigarettes    Last attempt to quit: 10/03/1995    Years since quitting: 21.9  . Smokeless tobacco: Never Used  Substance Use Topics  . Alcohol use: Yes    Alcohol/week: 1.0 standard drinks    Types: 1 Glasses of wine per week    Comment: per week  . Drug use: No    Allergies as of 09/12/2017 - Review Complete 09/12/2017  Allergen Reaction Noted  . Codeine Other (See Comments)   . Morphine Other (See Comments)   . Zyrtec [cetirizine] Other (See Comments) 01/05/2013    Review of Systems:    All systems reviewed and negative except where noted in HPI.   Physical Exam:  BP (!) 152/74   Pulse 70   Ht 5\' 5"  (1.651 m)   Wt 120 lb (54.4 kg)   BMI 19.97 kg/m  No LMP recorded. Patient is postmenopausal. Psych:  Alert  and cooperative. Normal mood and affect. General:   Alert,  Well-developed, well-nourished, pleasant and cooperative in NAD Head:  Normocephalic and atraumatic. Eyes:  Sclera clear, no icterus.   Conjunctiva pink. Ears:  Normal auditory acuity. Nose:  No deformity, discharge, or lesions. Mouth:  No deformity or lesions,oropharynx pink & moist. Neck:  Supple; no masses or thyromegaly. Lungs:  Packemaker below left clavicle Respirations even and unlabored.  Clear throughout to auscultation.   No wheezes, crackles, or rhonchi. No acute distress. Heart:  Regular rate and rhythm; no murmurs, clicks, rubs, or gallops. Abdomen:  Normal bowel sounds.  No bruits.  Soft, non-tender and non-distended without masses, hepatosplenomegaly or hernias noted.  No guarding or rebound tenderness.    Neurologic:  Alert and oriented x3;  grossly normal neurologically. Skin:  Intact without significant lesions or rashes. No jaundice. Lymph Nodes:  No significant cervical adenopathy. Psych:  Alert and cooperative. Normal mood and affect.  Imaging Studies: No results found.  Assessment and Plan:   DAWNNA GRITZ is a 84 y.o. y/o female has been referred for diarrhea which has resolved.   Follow up as needed     Dr Jonathon Bellows MD,MRCP(U.K)

## 2017-09-16 ENCOUNTER — Ambulatory Visit: Payer: Self-pay | Admitting: Urology

## 2017-09-26 LAB — CUP PACEART REMOTE DEVICE CHECK
Battery Remaining Longevity: 101 mo
Battery Remaining Percentage: 95.5 %
Battery Voltage: 3.01 V
Brady Statistic AP VP Percent: 71 %
Brady Statistic AP VS Percent: 1 %
Brady Statistic AS VP Percent: 28 %
Brady Statistic AS VS Percent: 0 %
Brady Statistic RA Percent Paced: 1 %
Brady Statistic RV Percent Paced: 99 %
Date Time Interrogation Session: 20190808060013
Implantable Lead Implant Date: 20090610
Implantable Lead Implant Date: 20090610
Implantable Lead Location: 753859
Implantable Lead Location: 753860
Implantable Lead Model: 5076
Implantable Pulse Generator Implant Date: 20190204
Lead Channel Impedance Value: 390 Ohm
Lead Channel Impedance Value: 410 Ohm
Lead Channel Pacing Threshold Amplitude: 0.75 V
Lead Channel Pacing Threshold Pulse Width: 0.5 ms
Lead Channel Sensing Intrinsic Amplitude: 1.4 mV
Lead Channel Sensing Intrinsic Amplitude: 12 mV
Lead Channel Setting Pacing Amplitude: 2 V
Lead Channel Setting Pacing Amplitude: 2.5 V
Lead Channel Setting Pacing Pulse Width: 0.5 ms
Lead Channel Setting Sensing Sensitivity: 2 mV
Pulse Gen Model: 2272
Pulse Gen Serial Number: 8991929

## 2017-09-30 ENCOUNTER — Encounter: Payer: Self-pay | Admitting: Urology

## 2017-09-30 ENCOUNTER — Other Ambulatory Visit: Payer: Self-pay | Admitting: Urology

## 2017-09-30 ENCOUNTER — Ambulatory Visit: Payer: Medicare Other | Admitting: Urology

## 2017-09-30 VITALS — BP 123/72 | HR 71 | Ht 65.0 in | Wt 119.0 lb

## 2017-09-30 DIAGNOSIS — R3129 Other microscopic hematuria: Secondary | ICD-10-CM | POA: Diagnosis not present

## 2017-09-30 DIAGNOSIS — N3946 Mixed incontinence: Secondary | ICD-10-CM | POA: Diagnosis not present

## 2017-09-30 NOTE — Progress Notes (Signed)
09/30/2017 9:35 AM   Jessica [name] 02/18/1929 [id number]  Referring provider: Leone Haven, MD [address] [place], [place] [address]  Chief Complaint  Patient presents with  . Hematuria    New patient    HPI: I was consulted to assist the patient's microscopic hematuria.  The patient does say she does see blood in the urine.  She does take a blood thinner.  She is on a blood thinner but not aspirin.  She has not had previous bladder surgery.  She does not get bladder infections.  She has a smoking history  She voids every 3 hours during the day and is continent.  She gets up 3 or 4 times a night pending fluid intake  Modifying factors: There are no other modifying factors  Associated signs and symptoms: There are no other associated signs and symptoms Aggravating and relieving factors: There are no other aggravating or relieving factors Severity: Moderate Duration: Persistent   PMH: Past Medical History:  Diagnosis Date  . AAA (abdominal aortic aneurysm) ([place])   . Abdominal aneurysm without mention of rupture   . Atrioventricular block, complete ([place])   . Cardiac pacemaker in situ   . Gout   . Hernia   . Hyperlipidemia   . Hypertension     Surgical History: Past Surgical History:  Procedure Laterality Date  . ABDOMINAL AORTIC ANEURYSM REPAIR    . HERNIA REPAIR    . ohter     growth on colon surgery  . PPM GENERATOR CHANGEOUT N/A 02/25/2017   Procedure: PPM GENERATOR CHANGEOUT;  Surgeon: Deboraha Sprang, MD;  Location: [facility];  Service: Cardiovascular;  Laterality: N/A;  . TONSILLECTOMY      Home Medications:  Allergies as of 09/30/2017      Reactions   Codeine Other (See Comments)   Made her feel crazy   Morphine Other (See Comments)   Made her feel crazy   Zyrtec [cetirizine] Other (See Comments)   Causes facial numbness      Medication List        Accurate as of 09/30/17  9:35 AM. Always use your most recent med list.          acetaminophen 500 MG tablet Commonly known as:  TYLENOL Take 500 mg by mouth every 6 (six) hours as needed for moderate pain or headache.   albuterol 108 (90 Base) MCG/ACT inhaler Commonly known as:  PROVENTIL HFA;VENTOLIN HFA Inhale 2 puffs into the lungs every 6 (six) hours as needed for wheezing or shortness of breath.   amLODipine 5 MG tablet Commonly known as:  NORVASC Take 1 tablet (5 mg total) by mouth daily.   apixaban 2.5 MG Tabs tablet Commonly known as:  ELIQUIS Take 1 tablet (2.5 mg total) by mouth 2 (two) times daily.   atenolol 25 MG tablet Commonly known as:  TENORMIN Take 1 tablet (25 mg total) by mouth daily.   polyethylene glycol packet Commonly known as:  MIRALAX / GLYCOLAX Take 17 g by mouth daily as needed for mild constipation.   rosuvastatin 5 MG tablet Commonly known as:  CRESTOR Take 1 tablet (5 mg total) by mouth daily.   SPIRIVA HANDIHALER 18 MCG inhalation capsule Generic drug:  tiotropium INHALE ONE CAPSULE AS DIRECTED ONCE A DAY   VITAMIN B-12 PO Take 1 tablet by mouth daily.   VITAMIN D3 PO Take 1 capsule by mouth daily.       Allergies:  Allergies  Allergen Reactions  . Codeine Other (See Comments)    Made her feel crazy  . Morphine Other (See Comments)    Made her feel crazy  . Zyrtec [Cetirizine] Other (See Comments)    Causes facial numbness    Family History: Family History  Problem Relation Age of Onset  . Heart disease Father   . Alcohol abuse Father   . Cancer Brother     Social History:  reports that she quit smoking about 22 years ago. Her smoking use included cigarettes. She has a 75.00 pack-year smoking history. She has never used smokeless tobacco. She reports that she drinks about 1.0 standard drinks of alcohol per week. She reports that she does not use drugs.  ROS: UROLOGY Frequent Urination?: No Hard to postpone urination?: No Burning/pain with urination?: No Get up at night to urinate?:  No Leakage of urine?: No Urine stream starts and stops?: No Trouble starting stream?: No Do you have to strain to urinate?: No Blood in urine?: No Urinary tract infection?: No Sexually transmitted disease?: No Painful intercourse?: No Weak stream?: No Currently pregnant?: No Vaginal bleeding?: No Last menstrual period?: n  Gastrointestinal Nausea?: No Vomiting?: No Indigestion/heartburn?: No Diarrhea?: No Constipation?: No  Constitutional Fever: No Night sweats?: No Weight loss?: Yes Fatigue?: No  Skin Skin rash/lesions?: No Itching?: No  Eyes Blurred vision?: No Double vision?: No  Ears/Nose/Throat Sore throat?: No Sinus problems?: No  Hematologic/Lymphatic Swollen glands?: No Easy bruising?: No  Cardiovascular Leg swelling?: No Chest pain?: No  Respiratory Cough?: No Shortness of breath?: Yes  Endocrine Excessive thirst?: No  Musculoskeletal Back pain?: No Joint pain?: No  Neurological Headaches?: No Dizziness?: No  Psychologic Depression?: No Anxiety?: No  Physical Exam: BP 123/72   Pulse 71   Ht 5\' 5"  (1.651 m)   Wt 54 kg   BMI 19.80 kg/m   Constitutional:  Alert and oriented, No acute distress. HEENT: [place] AT, moist mucus membranes.  Trachea midline, no masses. Cardiovascular: No clubbing, cyanosis, or edema. Respiratory: Normal respiratory effort, no increased work of breathing. GI: Abdomen is soft, nontender, nondistended, no abdominal masses GU: No CVA tenderness.  No bladder tenderness Skin: No rashes, bruises or suspicious lesions. Lymph: No cervical or inguinal adenopathy. Neurologic: Grossly intact, no focal deficits, moving all 4 extremities. Psychiatric: Normal mood and affect.  Laboratory Data: Lab Results  Component Value Date   WBC 5.3 04/06/2017   HGB 11.9 (L) 04/06/2017   HCT 37.2 04/06/2017   MCV 92.1 04/06/2017   PLT 163 04/06/2017    Lab Results  Component Value Date   CREATININE 1.40 (H) 08/30/2017     No results found for: PSA  No results found for: TESTOSTERONE  Lab Results  Component Value Date   HGBA1C 5.2 04/04/2017    Urinalysis    Component Value Date/Time   COLORURINE AMBER (A) 04/04/2017 0847   APPEARANCEUR CLOUDY (A) 04/04/2017 0847   LABSPEC 1.024 04/04/2017 0847   PHURINE 5.0 04/04/2017 0847   GLUCOSEU NEGATIVE 04/04/2017 0847   GLUCOSEU NEGATIVE 03/06/2017 0930   HGBUR LARGE (A) 04/04/2017 0847   BILIRUBINUR small 08/30/2017 1108   KETONESUR 5 (A) 04/04/2017 0847   PROTEINUR Positive (A) 08/30/2017 1108   PROTEINUR 100 (A) 04/04/2017 0847   UROBILINOGEN 0.2 08/30/2017 1108   UROBILINOGEN 0.2 03/06/2017 0930   NITRITE neg 08/30/2017 1108   NITRITE NEGATIVE 04/04/2017 0847   LEUKOCYTESUR Negative 08/30/2017 1108    Pertinent Imaging: None  Assessment &  Plan: Hematuria CT scan ordered.  Patient will come back for pelvic examination and cystoscopy  1. Microscopic hematuria Nighttime frequency - Urinalysis, Complete - CULTURE, URINE COMPREHENSIVE   No follow-ups on file.  Reece Packer, MD  [facility] [address], [place], [place] [address] [phone]

## 2017-10-02 LAB — BASIC METABOLIC PANEL
BUN/Creatinine Ratio: 18 (ref 12–28)
BUN: 24 mg/dL (ref 8–27)
CO2: 27 mmol/L (ref 20–29)
Calcium: 9.5 mg/dL (ref 8.7–10.3)
Chloride: 100 mmol/L (ref 96–106)
Creatinine, Ser: 1.33 mg/dL — ABNORMAL HIGH (ref 0.57–1.00)
GFR calc Af Amer: 42 mL/min/{1.73_m2} — ABNORMAL LOW (ref >59)
GFR calc non Af Amer: 36 mL/min/{1.73_m2} — ABNORMAL LOW (ref >59)
Glucose: 91 mg/dL (ref 65–99)
Potassium: 4 mmol/L (ref 3.5–5.2)
Sodium: 141 mmol/L (ref 134–144)

## 2017-10-02 LAB — SPECIMEN STATUS REPORT

## 2017-10-16 ENCOUNTER — Ambulatory Visit: Payer: Medicare Other | Admitting: Family Medicine

## 2017-10-17 ENCOUNTER — Ambulatory Visit
Admission: RE | Admit: 2017-10-17 | Discharge: 2017-10-17 | Disposition: A | Discharge status: Home / Self Care | Payer: Medicare Other | Source: Ambulatory Visit | Attending: Urology | Admitting: Urology

## 2017-10-17 DIAGNOSIS — K8020 Calculus of gallbladder without cholecystitis without obstruction: Secondary | ICD-10-CM | POA: Diagnosis not present

## 2017-10-17 DIAGNOSIS — I700 Atherosclerosis of aorta: Secondary | ICD-10-CM | POA: Insufficient documentation

## 2017-10-17 DIAGNOSIS — R3129 Other microscopic hematuria: Secondary | ICD-10-CM | POA: Diagnosis not present

## 2017-10-17 DIAGNOSIS — K7689 Other specified diseases of liver: Secondary | ICD-10-CM | POA: Diagnosis not present

## 2017-10-17 DIAGNOSIS — J439 Emphysema, unspecified: Secondary | ICD-10-CM | POA: Insufficient documentation

## 2017-10-17 DIAGNOSIS — I722 Aneurysm of renal artery: Secondary | ICD-10-CM | POA: Diagnosis not present

## 2017-10-17 DIAGNOSIS — N261 Atrophy of kidney (terminal): Secondary | ICD-10-CM | POA: Insufficient documentation

## 2017-10-17 MED ORDER — IOPAMIDOL (ISOVUE-300) INJECTION 61%
85.0000 mL | Freq: Once | INTRAVENOUS | Status: AC | PRN
  Administered 2017-10-17: 85 mL via INTRAVENOUS

## 2017-10-18 ENCOUNTER — Encounter: Payer: Self-pay | Admitting: Family Medicine

## 2017-10-21 ENCOUNTER — Ambulatory Visit: Payer: Medicare Other | Admitting: Urology

## 2017-10-21 ENCOUNTER — Encounter: Payer: Self-pay | Admitting: Urology

## 2017-10-21 VITALS — BP 153/76 | HR 70 | Ht 65.0 in | Wt 121.7 lb

## 2017-10-21 DIAGNOSIS — R3129 Other microscopic hematuria: Secondary | ICD-10-CM

## 2017-10-21 LAB — MICROSCOPIC EXAMINATION: WBC, UA: NONE SEEN /hpf (ref 0–5)

## 2017-10-21 LAB — URINALYSIS, COMPLETE
Bilirubin, UA: NEGATIVE
Glucose, UA: NEGATIVE
Ketones, UA: NEGATIVE
Leukocytes, UA: NEGATIVE
Nitrite, UA: NEGATIVE
Specific Gravity, UA: 1.01 (ref 1.005–1.030)
Urobilinogen, Ur: 0.2 mg/dL (ref 0.2–1.0)
pH, UA: 5.5 (ref 5.0–7.5)

## 2017-10-21 NOTE — Progress Notes (Signed)
In and Out Catheterization  Patient is present today for a I & O catheterization due to the patient being unable to void. Patient was cleaned and prepped in a sterile fashion with betadine and Lidocaine 2% jelly was instilled into the urethra.  A FR cath was inserted no complications were noted , 76ml of urine return was noted, urine was yellow in color. A clean urine sample was collected for UA and CX. Bladder was drained  And catheter was removed with out difficulty.    Preformed by: Sherril Cong, CMA (AAMA)

## 2017-10-21 NOTE — Progress Notes (Signed)
10/21/2017 9:26 AM   Jessica [name] 11-Apr-1929 [id number]  Referring provider: Leone Haven, MD [name], [place] [address]  No chief complaint on file.   HPI: I was consulted to assist the patient's microscopic hematuria.  The patient does say she does see blood in the urine.  She does take a blood thinner.  She is on a blood thinner but not aspirin.  She has not had previous bladder surgery.  She does not get bladder infections.  She has a smoking history  She voids every 3 hours during the day and is continent.  She gets up 3 or 4 times a night pending fluid intake   Today CT scan: Moderate left renal atrophy.  A one-point centimeter right renal lesion with minimal enhancement.  Complex cyst 1.7 cm left kidney.  In the lower pole right renal collecting system patient has 2 cm soft renal pelvic tissue thickening associated with some calcification..  Left ureter not visualized well likely from decreased left renal function.  Radiology was concerned for urothelial carcinoma in the right collecting system.  It was felt that the right renal lesion that is cystic is indeterminant with possible pseudo-enhancement and recommended reevaluation and follow-up  Clinically not infected today.  Frequency stable.  Red blood cells in urine.  Patient had no stress incontinence or prolapse  Cystoscopy: The patient underwent flexible cystoscopy utilizing sterile technique.  Bladder mucosa and trigone were normal.  There were no mucosal abnormalities.  No blood from ureters.  The patient had urethral mucosal prolapse     PMH: Past Medical History:  Diagnosis Date  . AAA (abdominal aortic aneurysm) ([place])   . Abdominal aneurysm without mention of rupture   . Atrioventricular block, complete ([place])   . Cardiac pacemaker in situ   . Gout   . Hernia   . Hyperlipidemia   . Hypertension     Surgical History: Past Surgical History:  Procedure Laterality Date  .  ABDOMINAL AORTIC ANEURYSM REPAIR    . HERNIA REPAIR    . ohter     growth on colon surgery  . PPM GENERATOR CHANGEOUT N/A 02/25/2017   Procedure: PPM GENERATOR CHANGEOUT;  Surgeon: Deboraha Sprang, MD;  Location: [facility];  Service: Cardiovascular;  Laterality: N/A;  . TONSILLECTOMY      Home Medications:  Allergies as of 10/21/2017      Reactions   Codeine Other (See Comments)   Made her feel crazy   Morphine Other (See Comments)   Made her feel crazy   Zyrtec [cetirizine] Other (See Comments)   Causes facial numbness      Medication List        Accurate as of 10/21/17  9:26 AM. Always use your most recent med list.          acetaminophen 500 MG tablet Commonly known as:  TYLENOL Take 500 mg by mouth every 6 (six) hours as needed for moderate pain or headache.   albuterol 108 (90 Base) MCG/ACT inhaler Commonly known as:  PROVENTIL HFA;VENTOLIN HFA Inhale 2 puffs into the lungs every 6 (six) hours as needed for wheezing or shortness of breath.   amLODipine 5 MG tablet Commonly known as:  NORVASC Take 1 tablet (5 mg total) by mouth daily.   apixaban 2.5 MG Tabs tablet Commonly known as:  ELIQUIS Take 1 tablet (2.5 mg total) by mouth 2 (two) times daily.   atenolol 25 MG tablet Commonly  known as:  TENORMIN Take 1 tablet (25 mg total) by mouth daily.   polyethylene glycol packet Commonly known as:  MIRALAX / GLYCOLAX Take 17 g by mouth daily as needed for mild constipation.   rosuvastatin 5 MG tablet Commonly known as:  CRESTOR Take 1 tablet (5 mg total) by mouth daily.   SPIRIVA HANDIHALER 18 MCG inhalation capsule Generic drug:  tiotropium INHALE ONE CAPSULE AS DIRECTED ONCE A DAY   VITAMIN B-12 PO Take 1 tablet by mouth daily.   VITAMIN D3 PO Take 1 capsule by mouth daily.       Allergies:  Allergies  Allergen Reactions  . Codeine Other (See Comments)    Made her feel crazy  . Morphine Other (See Comments)    Made her feel crazy  .  Zyrtec [Cetirizine] Other (See Comments)    Causes facial numbness    Family History: Family History  Problem Relation Age of Onset  . Heart disease Father   . Alcohol abuse Father   . Cancer Brother     Social History:  reports that she quit smoking about 22 years ago. Her smoking use included cigarettes. She has a 75.00 pack-year smoking history. She has never used smokeless tobacco. She reports that she drinks about 1.0 standard drinks of alcohol per week. She reports that she does not use drugs.  ROS:                                        Physical Exam: There were no vitals taken for this visit.  Constitutional:  Alert and oriented, No acute distress.   Laboratory Data: Lab Results  Component Value Date   WBC 5.3 04/06/2017   HGB 11.9 (L) 04/06/2017   HCT 37.2 04/06/2017   MCV 92.1 04/06/2017   PLT 163 04/06/2017    Lab Results  Component Value Date   CREATININE 1.33 (H) 09/30/2017    No results found for: PSA  No results found for: TESTOSTERONE  Lab Results  Component Value Date   HGBA1C 5.2 04/04/2017    Urinalysis    Component Value Date/Time   COLORURINE AMBER (A) 04/04/2017 0847   APPEARANCEUR CLOUDY (A) 04/04/2017 0847   LABSPEC 1.024 04/04/2017 0847   PHURINE 5.0 04/04/2017 0847   GLUCOSEU NEGATIVE 04/04/2017 0847   GLUCOSEU NEGATIVE 03/06/2017 0930   HGBUR LARGE (A) 04/04/2017 0847   BILIRUBINUR small 08/30/2017 1108   KETONESUR 5 (A) 04/04/2017 0847   PROTEINUR Positive (A) 08/30/2017 1108   PROTEINUR 100 (A) 04/04/2017 0847   UROBILINOGEN 0.2 08/30/2017 1108   UROBILINOGEN 0.2 03/06/2017 0930   NITRITE neg 08/30/2017 1108   NITRITE NEGATIVE 04/04/2017 0847   LEUKOCYTESUR Negative 08/30/2017 1108    Pertinent Imaging: As noted  Assessment & Plan: Patient may have a malignancy in the right kidney as noted.  The patient is elderly with medical comorbidities.  This may influence treatment recommendations.  I will  seek a second opinion by Dr. Erlene Quan or Dr Diamantina Providence.  The patient understands the differential diagnosis.  I did not mention ureteroscopy if needed in the future  1. Microscopic hematuria  - Urinalysis, Complete   No follow-ups on file.  Reece Packer, MD  [facility] [address] [place], [place] [address] [phone]

## 2017-10-23 LAB — CULTURE, URINE COMPREHENSIVE

## 2017-10-28 ENCOUNTER — Ambulatory Visit (INDEPENDENT_AMBULATORY_CARE_PROVIDER_SITE_OTHER): Payer: Medicare Other

## 2017-10-28 ENCOUNTER — Encounter (INDEPENDENT_AMBULATORY_CARE_PROVIDER_SITE_OTHER): Payer: Self-pay | Admitting: Vascular Surgery

## 2017-10-28 ENCOUNTER — Ambulatory Visit (INDEPENDENT_AMBULATORY_CARE_PROVIDER_SITE_OTHER): Payer: Medicare Other | Admitting: Vascular Surgery

## 2017-10-28 VITALS — BP 158/79 | HR 71 | Resp 16 | Ht 65.0 in | Wt 118.8 lb

## 2017-10-28 DIAGNOSIS — I714 Abdominal aortic aneurysm, without rupture, unspecified: Secondary | ICD-10-CM

## 2017-10-28 DIAGNOSIS — I10 Essential (primary) hypertension: Secondary | ICD-10-CM

## 2017-10-28 DIAGNOSIS — I6523 Occlusion and stenosis of bilateral carotid arteries: Secondary | ICD-10-CM

## 2017-10-28 DIAGNOSIS — I701 Atherosclerosis of renal artery: Secondary | ICD-10-CM

## 2017-10-28 DIAGNOSIS — I722 Aneurysm of renal artery: Secondary | ICD-10-CM

## 2017-10-28 NOTE — Progress Notes (Signed)
MRN : [id number]  Jessica [name] is a 84 y.o. (06-Oct-1929) female who presents with chief complaint of No chief complaint on file. Marland Kitchen  History of Present Illness:   The patient returns to the office for followup and review of the noninvasive studies regarding renal vascular hypertension and renal artery stenosis. There have been no interval changes in the patient's blood pressure control.  He denies any major changes in is medications.  The patient denies headache or flushing.  No flank or unusual back pain.    There have been no significant changes to the patient's overall health care.  No interval shortening of the patient's walking distance or new symptoms consistent with claudication.  The patient denies the  development of rest pain symptoms. No new ulcers or wounds have occurred since the last visit.  The patient denies amaurosis fugax or recent TIA symptoms. There are no recent neurological changes noted. The patient denies history of DVT, PE or superficial thrombophlebitis. The patient denies recent episodes of angina or shortness of breath.   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    Past Medical History:  Diagnosis Date  . AAA (abdominal aortic aneurysm) ([place])   . Abdominal aneurysm without mention of rupture   . Atrioventricular block, complete ([place])   . Cardiac pacemaker in situ   . Gout   . Hernia   . Hyperlipidemia   . Hypertension     Past Surgical History:  Procedure Laterality Date  . ABDOMINAL AORTIC ANEURYSM REPAIR    . HERNIA REPAIR    . ohter     growth on colon surgery  . PPM GENERATOR CHANGEOUT N/A 02/25/2017   Procedure: PPM GENERATOR CHANGEOUT;   Surgeon: Deboraha Sprang, MD;  Location: [facility];  Service: Cardiovascular;  Laterality: N/A;  . TONSILLECTOMY      Social History Social History   Tobacco Use  . Smoking status: Former Smoker    Packs/day: 1.50    Years: 50.00    Pack years: 75.00    Types: Cigarettes    Last attempt to quit: 10/03/1995    Years since quitting: 22.0  . Smokeless tobacco: Never Used  Substance Use Topics  . Alcohol use: Yes    Alcohol/week: 1.0 standard drinks    Types: 1 Glasses of wine per week    Comment: per week  . Drug use: No    Family History Family History  Problem Relation Age of Onset  . Heart disease Father   . Alcohol abuse Father   . Cancer Brother     Allergies  Allergen Reactions  . Codeine Other (See Comments)    Made her feel crazy  . Morphine Other (See Comments)    Made her feel crazy  . Zyrtec [Cetirizine] Other (See Comments)    Causes facial numbness     REVIEW OF SYSTEMS (Negative unless checked)  Constitutional: [] Weight loss  [] Fever  [] Chills Cardiac: [] Chest pain   [] Chest pressure   [] Palpitations   [] Shortness of breath when laying flat   [] Shortness of breath with exertion. Vascular:  [] Pain in legs with walking   [] Pain in legs at rest  [] History of DVT   []   Phlebitis   [] Swelling in legs   [] Varicose veins   [] Non-healing ulcers Pulmonary:   [] Uses home oxygen   [] Productive cough   [] Hemoptysis   [] Wheeze  [] COPD   [] Asthma Neurologic:  [] Dizziness   [] Seizures   [] History of stroke   [] History of TIA  [] Aphasia   [] Vissual changes   [] Weakness or numbness in arm   [] Weakness or numbness in leg Musculoskeletal:   [] Joint swelling   [x] Joint pain   [] Low back pain Hematologic:  [] Easy bruising  [] Easy bleeding   [] Hypercoagulable state   [] Anemic Gastrointestinal:  [] Diarrhea   [] Vomiting  [] Gastroesophageal reflux/heartburn   [] Difficulty swallowing. Genitourinary:  [x] Chronic kidney disease   [] Difficult urination  [] Frequent urination    [] Blood in urine Skin:  [] Rashes   [] Ulcers  Psychological:  [] History of anxiety   []  History of major depression.  Physical Examination  There were no vitals filed for this visit. There is no height or weight on file to calculate BMI. Gen: WD/WN, NAD Head: [place]/AT, No temporalis wasting.  Ear/Nose/Throat: Hearing grossly intact, nares w/o erythema or drainage Eyes: PER, EOMI, sclera nonicteric.  Neck: Supple, no large masses.   Pulmonary:  Good air movement, no audible wheezing bilaterally, no use of accessory muscles.  Cardiac: RRR, no JVD Vascular: no renal bruit auscultated  Vessel Right Left  Radial Palpable Palpable  Gastrointestinal: Non-distended. No guarding/no peritoneal signs.  Musculoskeletal: M/S 5/5 throughout.  No deformity or atrophy.  Neurologic: CN 2-12 intact. Symmetrical.  Speech is fluent. Motor exam as listed above. Psychiatric: Judgment intact, Mood & affect appropriate for pt's clinical situation. Dermatologic: No rashes or ulcers noted.  No changes consistent with cellulitis. Lymph : No lichenification or skin changes of chronic lymphedema.  CBC Lab Results  Component Value Date   WBC 5.3 04/06/2017   HGB 11.9 (L) 04/06/2017   HCT 37.2 04/06/2017   MCV 92.1 04/06/2017   PLT 163 04/06/2017    BMET    Component Value Date/Time   NA 141 09/30/2017 0212   NA 141 12/15/2013 1721   K 4.0 09/30/2017 0212   K 3.1 (L) 12/15/2013 1721   CL 100 09/30/2017 0212   CL 103 12/15/2013 1721   CO2 27 09/30/2017 0212   CO2 28 12/15/2013 1721   GLUCOSE 91 09/30/2017 0212   GLUCOSE 97 08/30/2017 1028   GLUCOSE 115 (H) 12/15/2013 1721   BUN 24 09/30/2017 0212   BUN 26 (H) 12/15/2013 1721   CREATININE 1.33 (H) 09/30/2017 0212   CREATININE 1.40 (H) 12/15/2013 1721   CALCIUM 9.5 09/30/2017 0212   CALCIUM 9.3 12/15/2013 1721   GFRNONAA 36 (L) 09/30/2017 0212   GFRNONAA 38 (L) 12/15/2013 1721   GFRAA 42 (L) 09/30/2017 0212   GFRAA 46 (L) 12/15/2013 1721   CrCl  cannot be calculated (Patient's most recent lab result is older than the maximum 21 days allowed.).  COAG Lab Results  Component Value Date   INR 0.99 04/04/2017   INR 1.3 02/27/2017   INR 2.18 02/25/2017    Radiology Ct Hematuria Workup  Result Date: 10/17/2017 CLINICAL DATA:  Microscopic hematuria. Intermittent painless gross hematuria. On blood thinners. EXAM: CT ABDOMEN AND PELVIS WITHOUT AND WITH CONTRAST TECHNIQUE: Multidetector CT imaging of the abdomen and pelvis was performed following the standard protocol before and following the bolus administration of intravenous contrast. CONTRAST:  12mL ISOVUE-300 IOPAMIDOL (ISOVUE-300) INJECTION 61% COMPARISON:  Pelvic CT 03/12/2017.  Abdominopelvic CT 08/12/2015 FINDINGS: Lower chest: emphysema at  the lung bases. Normal heart size, without pericardial effusion. Incompletely imaged pacer. Hepatobiliary: High left hepatic lobe cyst. Smaller low-density liver lesions are well-circumscribed and also likely cysts. Focal steatosis adjacent the falciform ligament. Thin tiny gallstone without acute cholecystitis or biliary duct dilatation. Pancreas: Normal, without mass or ductal dilatation. Spleen: Normal in size, without focal abnormality. Adrenals/Urinary Tract: Normal right adrenal gland. Mild left adrenal thickening. Moderate left renal atrophy, progressive. No renal calculi or hydronephrosis. No hydroureter or ureteric calculi. No bladder calculi. Bilateral too small to characterize renal lesions. An interpolar right renal lesion measures 1.3 cm on image 27/9. Increases from 14 HU to 26 HU on portal venous phase imaging. Just inferior to this, an interpolar right renal lesion is too small to characterize at 9 mm. Lower pole left renal exophytic 2.8 cm cyst. Interpolar left renal lesion is most consistent with a hemorrhagic/proteinaceous cyst at 1.7 cm. Within the lower pole right renal collecting system, partially calcified soft tissue density measures on  the order of 2.1 x 1.7 cm including on image 31/9. This corresponds to collecting system and inferolateral right renal pelvis soft tissue thickening, including on coronal image 65/series 20. Poor left-sided and moderate right-sided renal collecting system opacification on delayed images. Good right ureteric opacification, without filling defect. The left ureter is not well opacified. No enhancing bladder mass or filling defect on delayed images. Stomach/Bowel: Gastric antral underdistention. Extensive colonic diverticulosis. Surgical changes about the right-side of the colon. The transverse colon is positioned within an area of similar ventral abdominal wall laxity. Apparent wall thickening of the medial descending duodenum, including on image 30/9. Otherwise normal small bowel. Vascular/Lymphatic: Advanced aortic and branch vessel atherosclerosis. Saccular aneurysm off the left side of the aorta, at the level of the left renal artery. Example at 2.7 x 2.4 cm on image 18/9. Compare 1.7 x 1.7 cm at the same level back in 2017. Patent right renal vein. No abdominopelvic adenopathy. Reproductive: Normal uterus and adnexa. Other: No significant free fluid. Mild pelvic floor laxity. Fat containing left inguinal hernia. Prior ventral abdominal wall hernia repair. Musculoskeletal: Mild osteopenia. Right pubic rami fractures are new since the prior CT. Lumbosacral spondylosis, including disc bulges at multiple lumbar levels. IMPRESSION: 1. Partially calcified soft tissue within the lower pole right renal collecting system. Suspicious for urothelial carcinoma. 2. Bilateral renal lesions, primarily cysts and too small to characterize foci. An interpolar right renal lesion demonstrates apparent increased density after contrast, indeterminate. Most likely "pseudo enhancement" a CT reconstruction artifact. If the patient does not undergo right nephrectomy, this could be re-evaluated at follow-up. 3. Suboptimal evaluation of the  atrophic left kidney's collecting system and ureter. 4. No evidence of metastatic disease in the abdomen or pelvis. 5. Apparent descending duodenal soft tissue thickening is indeterminate and may simply be due to underdistention. Correlate with any symptoms of duodenitis. Duodenal mass felt unlikely, based on appearance of coronal reformats. 6. Increase in aortic saccular aneurysm at the level of the left renal artery. Development of left renal atrophy, likely related to renal insufficiency. 7. Aortic atherosclerosis (ICD10-I70.0) and emphysema (ICD10-J43.9). 8. Prior ventral abdominal wall hernia repair with similar residual laxity containing transverse colon. 9. Cholelithiasis. These results will be called to the ordering clinician or representative by the Radiologist Assistant, and communication documented in the PACS or zVision Dashboard. Electronically Signed   By: Abigail Miyamoto M.D.   On: 10/17/2017 11:36     Assessment/Plan 1. Renal artery stenosis (HCC) Given the worsening stenosis  of the right renal artery I believe that she is at risk for loosing her kidney  Angiography was discussed with the patient, the risks and benefits were reviewed and all questions were answered.  The patient will think about this and is not ready to proceed.  There is some question about a right renal mass and she is schedule to see Dr Candiss Norse.  We will plan for possible intervention after he has completed his workup.     Arrangements will be made to treat the right renal artery.  The patient will continue the current medications, no changes at this time.  The primary medical service will continue aggressive antihypertensive therapy as per the AHA guidelines     2. Renal artery aneurysm (HCC) Stable no change  3. AAA (abdominal aortic aneurysm) without rupture (HCC) S/P repair by Dr Kellie Simmering stable no changes  4. Bilateral carotid artery stenosis Recommend:  Given the patient's asymptomatic subcritical  stenosis no further invasive testing or surgery at this time.  Continue antiplatelet therapy as prescribed Continue management of CAD, HTN and Hyperlipidemia Healthy heart diet,  encouraged exercise at least 4 times per week  5. HYPERTENSION, BENIGN Continue antihypertensive medications as already ordered, these medications have been reviewed and there are no changes at this time.     Hortencia Pilar, MD  10/28/2017 8:36 AM

## 2017-10-30 ENCOUNTER — Encounter (INDEPENDENT_AMBULATORY_CARE_PROVIDER_SITE_OTHER): Payer: Self-pay

## 2017-10-31 DIAGNOSIS — I701 Atherosclerosis of renal artery: Secondary | ICD-10-CM | POA: Diagnosis not present

## 2017-10-31 DIAGNOSIS — N183 Chronic kidney disease, stage 3 (moderate): Secondary | ICD-10-CM | POA: Diagnosis not present

## 2017-10-31 DIAGNOSIS — R319 Hematuria, unspecified: Secondary | ICD-10-CM | POA: Diagnosis not present

## 2017-11-05 ENCOUNTER — Other Ambulatory Visit: Payer: Self-pay | Admitting: Nephrology

## 2017-11-05 DIAGNOSIS — N183 Chronic kidney disease, stage 3 unspecified: Secondary | ICD-10-CM

## 2017-11-18 ENCOUNTER — Other Ambulatory Visit: Payer: Self-pay | Admitting: Internal Medicine

## 2017-11-25 ENCOUNTER — Ambulatory Visit: Payer: Medicare Other | Admitting: Urology

## 2017-11-25 ENCOUNTER — Other Ambulatory Visit: Payer: Self-pay

## 2017-11-25 ENCOUNTER — Encounter
Admission: RE | Admit: 2017-11-25 | Discharge: 2017-11-25 | Disposition: A | Discharge status: Home / Self Care | Payer: Medicare Other | Source: Ambulatory Visit | Attending: Nephrology | Admitting: Nephrology

## 2017-11-25 ENCOUNTER — Encounter: Payer: Self-pay | Admitting: Urology

## 2017-11-25 VITALS — BP 162/75 | HR 70 | Ht 67.0 in | Wt 120.1 lb

## 2017-11-25 DIAGNOSIS — R310 Gross hematuria: Secondary | ICD-10-CM

## 2017-11-25 DIAGNOSIS — N2889 Other specified disorders of kidney and ureter: Secondary | ICD-10-CM | POA: Diagnosis not present

## 2017-11-25 DIAGNOSIS — N183 Chronic kidney disease, stage 3 unspecified: Secondary | ICD-10-CM

## 2017-11-25 DIAGNOSIS — R319 Hematuria, unspecified: Secondary | ICD-10-CM | POA: Diagnosis not present

## 2017-11-25 MED ORDER — FUROSEMIDE 10 MG/ML IJ SOLN
27.2500 mg | Freq: Once | INTRAMUSCULAR | Status: AC
  Administered 2017-11-25: 27.25 mg via INTRAVENOUS
  Filled 2017-11-25: qty 2.7

## 2017-11-25 MED ORDER — TECHNETIUM TC 99M MERTIATIDE
5.4500 | Freq: Once | INTRAVENOUS | Status: AC | PRN
  Administered 2017-11-25: 5.45 via INTRAVENOUS

## 2017-11-25 NOTE — Progress Notes (Signed)
11/25/2017 1:00 PM   PATTON SWISHER 11/05/1929 [id number]  Reason for visit: Follow up right upper tract mass on CTU  HPI: I had the pleasure of seeing Ms. Ades in urology clinic today for a 2 cm right upper tract filling defect seen on CT urogram performed by Dr. Arther Dames for gross hematuria.  She is an 84 year old comorbid female including history of AAA repair, atrial fibrillation on Eliquis, left atrophic kidney, and right renal artery stenosis with hypertension.  She has had 2 bouts of gross hematuria over the last few months which triggered hematuria work-up.  CT urogram showed a calcified mass in the right lower collecting system consistent with likely urothelial cell carcinoma.  There is no metastatic disease seen on her CT scan.  She is very frail appearing, and very resistant to any further investigation of this mass or surgery.  She denies any flank pain.  She does report 30 pounds of weight loss over the last 3 years.  75-pack-year smoking history, no other carcinogenic exposures.   ROS: Please see flowsheet from today's date for complete review of systems.  Physical Exam: BP (!) 162/75   Pulse 70   Ht _0  (1.702 m)   Wt 120 lb 1.6 oz (54.5 kg)   BMI 18.81 kg/m    Constitutional:  Alert and oriented, No acute distress, frail-appearing Respiratory: Normal respiratory effort, no increased work of breathing. GI: Abdomen is soft, nontender, nondistended, no abdominal masses GU: No CVA tenderness Skin: No rashes, bruises or suspicious lesions. Neurologic: Grossly intact, no focal deficits, moving all 4 extremities. Psychiatric: Normal mood and affect  Laboratory Data: Serum creatinine 1.33, eGFR 36  Pertinent Imaging:  I have personally reviewed the CT urogram dated 10/17/2017: Notable for approximately 2 cm filling defect in the right lower pole collecting system with calcifications, worrisome for urothelial cell carcinoma  Assessment & Plan:   In summary, Ms.  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  We discussed that definitive diagnosis of this lesion would require ureteroscopy with biopsy, and she and her son are both very resistant to any intervention.  With her other comorbidities, any surgical procedure would be high risk.  We discussed standard treatment if this was urothelial cell carcinoma would be nephroureterectomy, however with her advanced age and comorbidities, as well as atrophic left kidney she would unlikely be a candidate for aggressive surgical therapy.  Even if she did pursue surgical therapy, she would be at extremely high risk to be dialysis dependent postop.  She is very adverse to this, understandably.  Cytology was sent today in clinic to try to obtain a diagnosis without surgical intervention.  I discussed that other options would include discussion with radiation oncology and medical oncology to see if there are alternative management strategies with a more palliative approach in mind, or to minimize her hematuria in the future.  We discussed that there is a risk that as this grows she could develop worsening hematuria and renal colic from clots especially on her anticoagulation, or metastatic disease.  Finally, I would also recommend considering stopping anticoagulation in the setting of an active upper tract cancer that is at high risk to bleed, as the risks likely outweigh any benefit.  Follow-up in 8 weeks to discuss recommendations from radiation and oncology  25 minutes were spent with the patient today, greater than 50% were spent  in direct patient education and counseling regarding likely urothelial cell carcinoma, treatment options, and course of untreated malignancy.   Billey Co, [facility] [address] [place], [place] [address] [phone]

## 2017-11-28 ENCOUNTER — Ambulatory Visit (INDEPENDENT_AMBULATORY_CARE_PROVIDER_SITE_OTHER): Payer: Medicare Other | Admitting: *Deleted

## 2017-11-28 DIAGNOSIS — I442 Atrioventricular block, complete: Secondary | ICD-10-CM | POA: Diagnosis not present

## 2017-11-28 NOTE — Progress Notes (Signed)
Remote pacemaker transmission.   

## 2017-11-29 ENCOUNTER — Other Ambulatory Visit: Payer: Self-pay | Admitting: Urology

## 2017-12-01 ENCOUNTER — Encounter: Payer: Self-pay | Admitting: Cardiology

## 2017-12-02 ENCOUNTER — Institutional Professional Consult (permissible substitution): Payer: Medicare Other | Admitting: Radiation Oncology

## 2017-12-02 ENCOUNTER — Other Ambulatory Visit (INDEPENDENT_AMBULATORY_CARE_PROVIDER_SITE_OTHER): Payer: Self-pay | Admitting: Nurse Practitioner

## 2017-12-03 ENCOUNTER — Ambulatory Visit
Admission: RE | Admit: 2017-12-03 | Discharge: 2017-12-03 | Disposition: A | Discharge status: Home / Self Care | Payer: Medicare Other | Source: Ambulatory Visit | Attending: Vascular Surgery | Admitting: Vascular Surgery

## 2017-12-03 ENCOUNTER — Encounter
Admission: RE | Disposition: A | Discharge status: Home / Self Care | Payer: Self-pay | Source: Ambulatory Visit | Attending: Vascular Surgery

## 2017-12-03 ENCOUNTER — Encounter: Payer: Self-pay | Admitting: *Deleted

## 2017-12-03 ENCOUNTER — Other Ambulatory Visit: Payer: Self-pay

## 2017-12-03 DIAGNOSIS — Z888 Allergy status to other drugs, medicaments and biological substances status: Secondary | ICD-10-CM | POA: Diagnosis not present

## 2017-12-03 DIAGNOSIS — I6523 Occlusion and stenosis of bilateral carotid arteries: Secondary | ICD-10-CM | POA: Insufficient documentation

## 2017-12-03 DIAGNOSIS — M109 Gout, unspecified: Secondary | ICD-10-CM | POA: Insufficient documentation

## 2017-12-03 DIAGNOSIS — Z950 Presence of cardiac pacemaker: Secondary | ICD-10-CM | POA: Insufficient documentation

## 2017-12-03 DIAGNOSIS — N183 Chronic kidney disease, stage 3 (moderate): Secondary | ICD-10-CM | POA: Diagnosis not present

## 2017-12-03 DIAGNOSIS — Z87891 Personal history of nicotine dependence: Secondary | ICD-10-CM | POA: Diagnosis not present

## 2017-12-03 DIAGNOSIS — I129 Hypertensive chronic kidney disease with stage 1 through stage 4 chronic kidney disease, or unspecified chronic kidney disease: Secondary | ICD-10-CM | POA: Diagnosis not present

## 2017-12-03 DIAGNOSIS — Z9889 Other specified postprocedural states: Secondary | ICD-10-CM | POA: Diagnosis not present

## 2017-12-03 DIAGNOSIS — E785 Hyperlipidemia, unspecified: Secondary | ICD-10-CM | POA: Diagnosis not present

## 2017-12-03 DIAGNOSIS — I722 Aneurysm of renal artery: Secondary | ICD-10-CM | POA: Insufficient documentation

## 2017-12-03 DIAGNOSIS — Z885 Allergy status to narcotic agent status: Secondary | ICD-10-CM | POA: Insufficient documentation

## 2017-12-03 DIAGNOSIS — Z8249 Family history of ischemic heart disease and other diseases of the circulatory system: Secondary | ICD-10-CM | POA: Insufficient documentation

## 2017-12-03 DIAGNOSIS — I701 Atherosclerosis of renal artery: Secondary | ICD-10-CM | POA: Insufficient documentation

## 2017-12-03 DIAGNOSIS — Z8679 Personal history of other diseases of the circulatory system: Secondary | ICD-10-CM | POA: Insufficient documentation

## 2017-12-03 DIAGNOSIS — I150 Renovascular hypertension: Secondary | ICD-10-CM | POA: Diagnosis not present

## 2017-12-03 DIAGNOSIS — Z7901 Long term (current) use of anticoagulants: Secondary | ICD-10-CM | POA: Insufficient documentation

## 2017-12-03 DIAGNOSIS — Z79899 Other long term (current) drug therapy: Secondary | ICD-10-CM | POA: Insufficient documentation

## 2017-12-03 DIAGNOSIS — I2510 Atherosclerotic heart disease of native coronary artery without angina pectoris: Secondary | ICD-10-CM | POA: Diagnosis not present

## 2017-12-03 DIAGNOSIS — Z905 Acquired absence of kidney: Secondary | ICD-10-CM | POA: Diagnosis not present

## 2017-12-03 DIAGNOSIS — I714 Abdominal aortic aneurysm, without rupture: Secondary | ICD-10-CM | POA: Insufficient documentation

## 2017-12-03 HISTORY — PX: RENAL ANGIOGRAPHY: CATH118260

## 2017-12-03 LAB — CREATININE, SERUM
Creatinine, Ser: 1.36 mg/dL — ABNORMAL HIGH (ref 0.44–1.00)
GFR calc Af Amer: 40 mL/min — ABNORMAL LOW (ref >60)
GFR calc non Af Amer: 34 mL/min — ABNORMAL LOW (ref >60)

## 2017-12-03 LAB — BUN: BUN: 18 mg/dL (ref 8–23)

## 2017-12-03 SURGERY — RENAL ANGIOGRAPHY
Anesthesia: Moderate Sedation | Laterality: Right

## 2017-12-03 MED ORDER — HEPARIN (PORCINE) IN NACL 1000-0.9 UT/500ML-% IV SOLN
INTRAVENOUS | Status: AC
  Filled 2017-12-03: qty 1000

## 2017-12-03 MED ORDER — IOPAMIDOL (ISOVUE-300) INJECTION 61%
INTRAVENOUS | Status: DC | PRN
  Administered 2017-12-03: 40 mL via INTRA_ARTERIAL

## 2017-12-03 MED ORDER — MIDAZOLAM HCL 2 MG/2ML IJ SOLN
INTRAMUSCULAR | Status: DC | PRN
  Administered 2017-12-03: 0.5 mg via INTRAVENOUS
  Administered 2017-12-03: 2 mg via INTRAVENOUS

## 2017-12-03 MED ORDER — HYDROMORPHONE HCL 1 MG/ML IJ SOLN: 0.5000 mg | INTRAMUSCULAR | Status: DC | PRN

## 2017-12-03 MED ORDER — LABETALOL HCL 5 MG/ML IV SOLN
10.0000 mg | INTRAVENOUS | Status: DC | PRN
  Administered 2017-12-03: 10 mg via INTRAVENOUS

## 2017-12-03 MED ORDER — ONDANSETRON HCL 4 MG/2ML IJ SOLN: 4.0000 mg | Freq: Four times a day (QID) | INTRAMUSCULAR | Status: DC | PRN

## 2017-12-03 MED ORDER — ACETAMINOPHEN 325 MG PO TABS: 650.0000 mg | ORAL_TABLET | ORAL | Status: DC | PRN

## 2017-12-03 MED ORDER — SODIUM CHLORIDE 0.9 % IV SOLN
INTRAVENOUS | Status: DC
  Administered 2017-12-03: 09:00:00 via INTRAVENOUS

## 2017-12-03 MED ORDER — HYDRALAZINE HCL 20 MG/ML IJ SOLN: 5.0000 mg | INTRAMUSCULAR | Status: DC | PRN

## 2017-12-03 MED ORDER — LIDOCAINE HCL (PF) 1 % IJ SOLN
INTRAMUSCULAR | Status: AC
  Filled 2017-12-03: qty 30

## 2017-12-03 MED ORDER — CLOPIDOGREL BISULFATE 75 MG PO TABS
ORAL_TABLET | ORAL | Status: AC
  Administered 2017-12-03: 300 mg via ORAL
  Filled 2017-12-03: qty 4

## 2017-12-03 MED ORDER — CLOPIDOGREL BISULFATE 75 MG PO TABS: 75.0000 mg | ORAL_TABLET | Freq: Every day | ORAL | 11 refills | Status: DC

## 2017-12-03 MED ORDER — FENTANYL CITRATE (PF) 100 MCG/2ML IJ SOLN
INTRAMUSCULAR | Status: AC
  Filled 2017-12-03: qty 2

## 2017-12-03 MED ORDER — HEPARIN SODIUM (PORCINE) 1000 UNIT/ML IJ SOLN
INTRAMUSCULAR | Status: DC | PRN
  Administered 2017-12-03: 5000 [IU] via INTRAVENOUS

## 2017-12-03 MED ORDER — HEPARIN SODIUM (PORCINE) 1000 UNIT/ML IJ SOLN
INTRAMUSCULAR | Status: AC
  Filled 2017-12-03: qty 1

## 2017-12-03 MED ORDER — SODIUM CHLORIDE 0.9 % IV SOLN: 250.0000 mL | INTRAVENOUS | Status: DC | PRN

## 2017-12-03 MED ORDER — DEXTROSE 5 % IV SOLN
2.0000 g | Freq: Once | INTRAVENOUS | Status: AC
  Administered 2017-12-03: 2 g via INTRAVENOUS
  Filled 2017-12-03: qty 20

## 2017-12-03 MED ORDER — MIDAZOLAM HCL 5 MG/5ML IJ SOLN
INTRAMUSCULAR | Status: AC
  Filled 2017-12-03: qty 5

## 2017-12-03 MED ORDER — OXYCODONE HCL 5 MG PO TABS: 5.0000 mg | ORAL_TABLET | ORAL | Status: DC | PRN

## 2017-12-03 MED ORDER — CEFAZOLIN SODIUM-DEXTROSE 2-4 GM/100ML-% IV SOLN
INTRAVENOUS | Status: AC
  Filled 2017-12-03: qty 100

## 2017-12-03 MED ORDER — SODIUM CHLORIDE 0.9% FLUSH: 3.0000 mL | Freq: Two times a day (BID) | INTRAVENOUS | Status: DC

## 2017-12-03 MED ORDER — SODIUM CHLORIDE 0.9 % IV SOLN: INTRAVENOUS | Status: DC

## 2017-12-03 MED ORDER — FENTANYL CITRATE (PF) 100 MCG/2ML IJ SOLN
INTRAMUSCULAR | Status: DC | PRN
  Administered 2017-12-03: 50 ug via INTRAVENOUS
  Administered 2017-12-03: 12.5 ug via INTRAVENOUS

## 2017-12-03 MED ORDER — FENTANYL CITRATE (PF) 100 MCG/2ML IJ SOLN: 12.5000 ug | Freq: Once | INTRAMUSCULAR | Status: DC | PRN

## 2017-12-03 MED ORDER — CLOPIDOGREL BISULFATE 300 MG PO TABS
300.0000 mg | ORAL_TABLET | ORAL | Status: AC
  Administered 2017-12-03: 300 mg via ORAL

## 2017-12-03 MED ORDER — ATORVASTATIN CALCIUM 10 MG PO TABS
10.0000 mg | ORAL_TABLET | Freq: Every day | ORAL | Status: DC
  Filled 2017-12-03: qty 1

## 2017-12-03 MED ORDER — LABETALOL HCL 5 MG/ML IV SOLN
INTRAVENOUS | Status: AC
  Administered 2017-12-03: 10 mg via INTRAVENOUS
  Filled 2017-12-03: qty 4

## 2017-12-03 MED ORDER — SODIUM CHLORIDE 0.9% FLUSH: 3.0000 mL | INTRAVENOUS | Status: DC | PRN

## 2017-12-03 SURGICAL SUPPLY — 19 items
BALLN MUSTANG 7.0X20 75 (BALLOONS) ×2
BALLN ULTRVRSE 4X40X75C (BALLOONS) ×2
BALLOON MUSTANG 7.0X20 75 (BALLOONS) ×1 IMPLANT
BALLOON ULTRVRSE 4X40X75C (BALLOONS) ×1 IMPLANT
CATH PIG 70CM (CATHETERS) ×2 IMPLANT
CATH VS15FR (CATHETERS) ×2 IMPLANT
DEVICE PRESTO INFLATION (MISCELLANEOUS) ×2 IMPLANT
DEVICE STARCLOSE SE CLOSURE (Vascular Products) ×2 IMPLANT
GUIDEWIRE ANGLED .035 180CM (WIRE) ×2 IMPLANT
NEEDLE ENTRY 21GA 7CM ECHOTIP (NEEDLE) ×2 IMPLANT
PACK ANGIOGRAPHY (CUSTOM PROCEDURE TRAY) ×2 IMPLANT
SET INTRO CAPELLA COAXIAL (SET/KITS/TRAYS/PACK) ×2 IMPLANT
SHEATH BRITE TIP 5FRX11 (SHEATH) ×2 IMPLANT
SHEATH PINNACLE ST 6F 45CM (SHEATH) ×2 IMPLANT
SHIELD RADPAD SCOOP 12X17 (MISCELLANEOUS) ×2 IMPLANT
STENT LIFESTREAM 6X16X80 (Permanent Stent) ×2 IMPLANT
TUBING CONTRAST HIGH PRESS 72 (TUBING) ×2 IMPLANT
WIRE J 3MM .035X145CM (WIRE) ×2 IMPLANT
WIRE MAGIC TOR.035 180C (WIRE) ×2 IMPLANT

## 2017-12-03 NOTE — Op Note (Signed)
[facility] Percutaneous Study/Intervention Procedural Note    Surgeon(s): Nurse, children's: None  Pre-operative Diagnosis: Right renal artery stenosis with unilateral kidney, renovascular hypertension  Post-operative diagnosis: Same  Procedure(s) Performed: 1. Ultrasound guidance for vascular access right femoral artery 2. Catheter placement into right renal artery from right femoral approach 3. Aortogram and selective right renal angiogram 4. Balloon expandable stent placement to 7 mm with a 6 mm diameter x 16 mm length lifestream 5. StarClose closure device right femoral artery  Contrast: 40 cc  EBL: Less than 10 cc  Fluoro Time: 8.6 minutes  Moderate conscious sedation: Approximately 40 minutes with intravenous Versed and  Fentanyl  Indications: The patient is a 84 year old woman with worsening severe hypertension despite multiple medications.  She also has only the right kidney status post nephrectomy on the left.  The patient has suboptimal blood pressure control despite multiple antihypertensives and a noninvasive study demonstrating hemodynamically significant 95% right renal artery stenosis. Given the clinical scenario and the noninvasive findings, angiogram is indicated for further evaluation of her renal artery and potential treatment. Risks and benefits are discussed and informed consent is obtained.  Procedure: The patient was identified and appropriate procedural time out was performed. The patient was then placed supine on the table and prepped and draped in the usual sterile fashion.Moderate conscious sedation was administered with a face to face encounter with the patient throughout the procedure with my supervision of the RN administering medicines and monitoring the patients vital signs and mental status throughout from the start of the procedure  until the patient was taken to the recovery room  Ultrasound was used to evaluate the right common femoral artery. It was patent . A digital ultrasound image was acquired. A Seldinger needle was used to access the right common femoral artery under direct ultrasound guidance and a permanent image was performed. A 0.035 J wire was advanced without resistance and a 5Fr sheath was placed. Pigtail catheter was placed into the aorta at the T10 level and an AP aortogram was performed. This demonstrated greater than 95% stenosis of the right renal artery stump of the left renal artery is still visualized. The patient was then systemically heparinized with 5000 units of intravenous heparin was given. I used a V S1 catheter to cannulate the right renal artery and selective imaging was performed. This confirmed a greater than 95% stenosis of the right renal artery.  At this point I selected the Magic torque wire and crossed the lesion without difficulty.   Given the degree of stenosis I initially predilated the lesion with a 4 mm balloon inflated to 12 atm for 30 seconds  I then selected a 6 mm diameter x 16 mm length balloon expandable lifestream stent and brought this across the lesion.  This was deployed encompassing the lesion with its proximal extent going back into the aorta for a mm or two.  This was inflated to 12 ATM and the waist resolved.  Completion angiogram showed wide patency of the stent but it was still undersized.  A 7 mm x 20 mm Mustang balloon was then advanced across the stent inflated to 14 atm for 30 seconds.  Follow-up imaging demonstrated wide patency with less than 5% residual stenosis.  The stent is now appropriately sized.   The guide catheter was removed. Oblique arteriogram was performed of the right femoral artery and StarClose closure device was deployed in the usual fashion with excellent hemostatic result. The patient was  taken to the recovery room in stable condition having  tolerated the procedure well.  Findings:  Aortogram/Renal Arteries:Aorta is widely patent right renal artery demonstrates a greater than 95% stenosis at its origin is otherwise normal with a normal nephrogram.  Left renal artery there is a residual stump noted.  Following antroplasty and stent placement there is now less than 5% residual stenosis.   Condition:  Stable  Complications: None   Hortencia Pilar 12/03/2017 10:06 AM  This note was created with Dragon Medical transcription system. Any errors in dictation are purely unintentional.

## 2017-12-03 NOTE — H&P (Signed)
@LOGO @   MRN : [id number]  Jessica [name] is a 84 y.o. ([birth year]) female who presents with chief complaint of No chief complaint on file. Marland Kitchen  History of Present Illness:   The patient presents to [facility] for evaluation and possible treatment of her renal artery stenosis.  She denies any major changes in her medications. Her blood pressure has remained labile and is frequently higher than the desired goal.  The patient denies headache or flushing. No flank or unusual back pain.   There have been no significant changes to the patient's overall health care.  No interval shortening of the patient's walking distance or new symptoms consistent with claudication. The patient denies the development of rest pain symptoms. No new ulcers or wounds have occurred since the last visit.  The patient denies amaurosis fugax or recent TIA symptoms. There are no recent neurological changes noted. The patient denies history of DVT, PE or superficial thrombophlebitis. The patient denies recent episodes of angina or shortness of breath.   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 acetaminophen (TYLENOL) 500 MG tablet Take 500 mg by mouth 2 (two) times daily as needed for moderate pain or headache.   Marland Kitchen amLODipine (NORVASC) 5 MG tablet Take 1 tablet (5 mg total) by mouth daily.  . cholecalciferol 25 MCG (1000 UT) TABS Take 1,000 Units by mouth daily.  Marland Kitchen ELIQUIS 2.5 MG TABS tablet TAKE 1 TABLET BY MOUTH TWICE A DAY  . polyethylene glycol (MIRALAX / GLYCOLAX) packet Take 17 g by mouth daily as needed for mild constipation.  . rosuvastatin (CRESTOR) 5 MG tablet Take 1 tablet (5 mg total) by mouth daily. (Patient taking differently: Take 5 mg by mouth every evening. )  .  vitamin B-12 (CYANOCOBALAMIN) 1000 MCG tablet Take 1,000 mcg by mouth at bedtime.    Past Medical History:  Diagnosis Date  . AAA (abdominal aortic aneurysm) ([place])   . Abdominal aneurysm without mention of rupture   . Atrioventricular block, complete ([place])   . Cardiac pacemaker in situ   . Gout   . Hernia   . Hyperlipidemia   . Hypertension     Past Surgical History:  Procedure Laterality Date  . ABDOMINAL AORTIC ANEURYSM REPAIR    . HERNIA REPAIR    . ohter     growth on colon surgery  . PPM GENERATOR CHANGEOUT N/A 02/25/2017   Procedure: PPM GENERATOR CHANGEOUT;  Surgeon: Deboraha Sprang, MD;  Location: [facility];  Service: Cardiovascular;  Laterality: N/A;  . TONSILLECTOMY      Social History Social History   Tobacco Use  . Smoking status: Former Smoker    Packs/day: 1.50    Years: 50.00    Pack years: 75.00    Types: Cigarettes    Last attempt to quit: 10/03/1995    Years since quitting: 22.1  . Smokeless tobacco: Never Used  Substance Use Topics  . Alcohol use: Yes    Alcohol/week: 1.0 standard drinks    Types: 1 Glasses of wine per week    Comment: per week  . Drug use: No    Family History Family History  Problem Relation Age of Onset  . Heart disease Father   . Alcohol abuse Father   .  Cancer Brother     Allergies  Allergen Reactions  . Codeine Other (See Comments)    Made her feel crazy  . Morphine Other (See Comments)    Made her feel crazy  . Zyrtec [Cetirizine] Other (See Comments)    Causes facial numbness     REVIEW OF SYSTEMS (Negative unless checked)  Constitutional: [] Weight loss  [] Fever  [] Chills Cardiac: [] Chest pain   [] Chest pressure   [] Palpitations   [] Shortness of breath when laying flat   [] Shortness of breath with exertion. Vascular:  [] Pain in legs with walking   [] Pain in legs at rest  [] History of DVT   [] Phlebitis   [] Swelling in legs   [] Varicose veins   [] Non-healing ulcers Pulmonary:   [] Uses home oxygen    [] Productive cough   [] Hemoptysis   [] Wheeze  [] COPD   [] Asthma Neurologic:  [] Dizziness   [] Seizures   [] History of stroke   [] History of TIA  [] Aphasia   [] Vissual changes   [] Weakness or numbness in arm   [] Weakness or numbness in leg Musculoskeletal:   [] Joint swelling   [] Joint pain   [] Low back pain Hematologic:  [] Easy bruising  [] Easy bleeding   [] Hypercoagulable state   [] Anemic Gastrointestinal:  [] Diarrhea   [] Vomiting  [] Gastroesophageal reflux/heartburn   [] Difficulty swallowing. Genitourinary:  [] Chronic kidney disease   [] Difficult urination  [] Frequent urination   [] Blood in urine Skin:  [] Rashes   [] Ulcers  Psychological:  [] History of anxiety   []  History of major depression.  Physical Examination  Vitals:   12/03/17 0845  BP: (!) 178/80  Pulse: 70  Resp: 16  Temp: (!) 96.2 F (35.7 C)  TempSrc: Axillary  SpO2: 95%  Weight: 54.4 kg  Height: 5\' 7"  (1.702 m)   Body mass index is 18.79 kg/m. Gen: WD/WN, NAD Head: [place]/AT, No temporalis wasting.  Ear/Nose/Throat: Hearing grossly intact, nares w/o erythema or drainage Eyes: PER, EOMI, sclera nonicteric.  Neck: Supple, no large masses.   Pulmonary:  Good air movement, no audible wheezing bilaterally, no use of accessory muscles.  Cardiac: RRR, no JVD Vascular: No flank bruits auscultated Vessel Right Left  Radial Palpable Palpable  Gastrointestinal: Non-distended. No guarding/no peritoneal signs.  Musculoskeletal: M/S 5/5 throughout.  No deformity or atrophy.  Neurologic: CN 2-12 intact. Symmetrical.  Speech is fluent. Motor exam as listed above. Psychiatric: Judgment intact, Mood & affect appropriate for pt's clinical situation. Dermatologic: No rashes or ulcers noted.  No changes consistent with cellulitis. Lymph : No lichenification or skin changes of chronic lymphedema.  CBC Lab Results  Component Value Date   WBC 5.3 04/06/2017   HGB 11.9 (L) 04/06/2017   HCT 37.2 04/06/2017   MCV 92.1 04/06/2017   PLT  163 04/06/2017    BMET    Component Value Date/Time   NA 141 09/30/2017 0212   NA 141 12/15/2013 1721   K 4.0 09/30/2017 0212   K 3.1 (L) 12/15/2013 1721   CL 100 09/30/2017 0212   CL 103 12/15/2013 1721   CO2 27 09/30/2017 0212   CO2 28 12/15/2013 1721   GLUCOSE 91 09/30/2017 0212   GLUCOSE 97 08/30/2017 1028   GLUCOSE 115 (H) 12/15/2013 1721   BUN 24 09/30/2017 0212   BUN 26 (H) 12/15/2013 1721   CREATININE 1.33 (H) 09/30/2017 0212   CREATININE 1.40 (H) 12/15/2013 1721   CALCIUM 9.5 09/30/2017 0212   CALCIUM 9.3 12/15/2013 1721   GFRNONAA 36 (L) 09/30/2017 0212   GFRNONAA  38 (L) 12/15/2013 1721   GFRAA 42 (L) 09/30/2017 0212   GFRAA 46 (L) 12/15/2013 1721   CrCl cannot be calculated (Patient's most recent lab result is older than the maximum 21 days allowed.).  COAG Lab Results  Component Value Date   INR 0.99 04/04/2017   INR 1.3 02/27/2017   INR 2.18 02/25/2017    Radiology Nm Renal Imaging Flow W/pharm  Result Date: 11/25/2017 CLINICAL DATA:  Right renal mass. History of right renal artery stenosis, chronic kidney disease stage 3 and hematuria. EXAM: NUCLEAR MEDICINE RENAL SCAN WITH DIURETIC ADMINISTRATION TECHNIQUE: Radionuclide angiographic and sequential renal images were obtained after intravenous injection of radiopharmaceutical. Imaging was continued during slow intravenous injection of Lasix approximately 15 minutes after the start of the examination. Lasix 27.25 milligram. RADIOPHARMACEUTICALS:  5.45 mCi Technetium-81m MAG3 IV COMPARISON:  Abdominopelvic CT 10/17/2017. FINDINGS: Flow: There is normal arterial flow to the right kidney. The left kidney demonstrates decreased arterial blood flow. Left renogram: The left kidney demonstrates delayed and decreased cortical uptake. There is minimal excretion into the left collecting system. The renogram curve is essentially flat without response to Lasix administration. Right renogram: Normal renal cortical uptake and  excretion. The renogram curve is downsloping prior to Lasix administration. Differential: Left kidney = 9.4 % Right kidney = 90.6 % IMPRESSION: 1. Small left kidney demonstrates decreased perfusion and cortical uptake with minimal excretion. 2. Normal blood flow to and excretion from the right kidney. No hydronephrosis. 3. Differential renal function is approximately 9% left and 91% right. Electronically Signed   By: Richardean Sale M.D.   On: 11/25/2017 13:44    Assessment/Plan 1. Renal artery stenosis (HCC) Given the worsening stenosis of the right renal artery I believe that she is at risk for loosing her kidney  Angiography was discussed with the patient, the risks and benefits were reviewed and all questions were answered.  The patient will think about this and is not ready to proceed.  There is some question about a right renal mass and she is schedule to see Dr Candiss Norse.  We will plan for possible intervention after he has completed his workup.     Arrangements will be made to treat the right renal artery.  The patient will continue the current medications, no changes at this time.  The primary medical service will continue aggressive antihypertensive therapy as per the AHA guidelines     2. Renal artery aneurysm (HCC) Stable no change  3. AAA (abdominal aortic aneurysm) without rupture (HCC) S/P repair by Dr Kellie Simmering stable no changes  4. Bilateral carotid artery stenosis Recommend:  Given the patient's asymptomatic subcritical stenosis no further invasive testing or surgery at this time.  Continue antiplatelet therapy as prescribed Continue management of CAD, HTN and Hyperlipidemia Healthy heart diet,  encouraged exercise at least 4 times per week  5. HYPERTENSION, BENIGN Continue antihypertensive medications as already ordered, these medications have been reviewed and there are no changes at this time.    Hortencia Pilar, MD  12/03/2017 8:48 AM

## 2017-12-05 ENCOUNTER — Institutional Professional Consult (permissible substitution): Payer: Medicare Other | Admitting: Radiation Oncology

## 2017-12-10 ENCOUNTER — Telehealth: Payer: Self-pay | Admitting: Urology

## 2017-12-10 NOTE — Telephone Encounter (Signed)
Collette from the [facility] called the office today to report that you referred this patient to the cancer center.  They contacted the patient to make an appointment and she is refusing to make an appointment and does not wish to be seen.  The [facility] wanted you to be aware.

## 2017-12-16 ENCOUNTER — Other Ambulatory Visit (INDEPENDENT_AMBULATORY_CARE_PROVIDER_SITE_OTHER): Payer: Self-pay | Admitting: Vascular Surgery

## 2017-12-16 DIAGNOSIS — I701 Atherosclerosis of renal artery: Secondary | ICD-10-CM

## 2017-12-16 DIAGNOSIS — Z905 Acquired absence of kidney: Secondary | ICD-10-CM

## 2017-12-16 DIAGNOSIS — I150 Renovascular hypertension: Secondary | ICD-10-CM

## 2017-12-16 DIAGNOSIS — Z9889 Other specified postprocedural states: Secondary | ICD-10-CM

## 2017-12-18 ENCOUNTER — Ambulatory Visit (INDEPENDENT_AMBULATORY_CARE_PROVIDER_SITE_OTHER): Payer: Medicare Other | Admitting: Vascular Surgery

## 2017-12-18 ENCOUNTER — Encounter (INDEPENDENT_AMBULATORY_CARE_PROVIDER_SITE_OTHER): Payer: Self-pay | Admitting: Vascular Surgery

## 2017-12-18 ENCOUNTER — Ambulatory Visit (INDEPENDENT_AMBULATORY_CARE_PROVIDER_SITE_OTHER): Payer: Medicare Other

## 2017-12-18 VITALS — BP 135/78 | HR 72 | Resp 17 | Ht 66.5 in | Wt 119.0 lb

## 2017-12-18 DIAGNOSIS — Z87891 Personal history of nicotine dependence: Secondary | ICD-10-CM

## 2017-12-18 DIAGNOSIS — E782 Mixed hyperlipidemia: Secondary | ICD-10-CM | POA: Diagnosis not present

## 2017-12-18 DIAGNOSIS — I701 Atherosclerosis of renal artery: Secondary | ICD-10-CM

## 2017-12-18 DIAGNOSIS — Z9889 Other specified postprocedural states: Secondary | ICD-10-CM

## 2017-12-18 DIAGNOSIS — I150 Renovascular hypertension: Secondary | ICD-10-CM | POA: Diagnosis not present

## 2017-12-18 DIAGNOSIS — I10 Essential (primary) hypertension: Secondary | ICD-10-CM

## 2017-12-18 DIAGNOSIS — Z905 Acquired absence of kidney: Secondary | ICD-10-CM

## 2017-12-18 NOTE — Progress Notes (Signed)
Subjective:    Patient ID: Jessica [name], female    DOB: 11-14-1929, 84 y.o.   MRN: [id number] Chief Complaint  Patient presents with  . Follow-up    2 week [facility] f/u   Patient presents for her first post procedure follow-up.  The patient is status post a ultrasound guidance for vascular access right femoral artery, catheter placement into right renal artery from right femoral approach, aortogram and selective right renal angiogram, balloon expandable stent placement to 7 mm with a 6 mm diameter x 16 mm length lifestream with starClose closure device right femoral artery on 12/03/17.  The patient presents today without complaint.  Patient's access site has healed well.  Patient denies any flank pain, fever, nausea vomiting.  The patient underwent a bilateral renal duplex which was notable for patent right renal artery stents without any significant stenosis.  Normal right size kidney.  Right renal vein flow present.  Left renal artery occlusion which correlates with previous angiogram on December 03, 2017 with collateral flow visualized.  Abnormal size for the left kidney.  When compared to the previous exam on October 28, 2017 there is an improvement in the arterial patency to the right kidney left renal artery is stable.  Review of Systems  Constitutional: Negative.   HENT: Negative.   Eyes: Negative.   Respiratory: Negative.   Cardiovascular: Negative.   Gastrointestinal: Negative.   Endocrine: Negative.   Genitourinary: Negative.   Musculoskeletal: Negative.   Skin: Negative.   Allergic/Immunologic: Negative.   Neurological: Negative.   Hematological: Negative.   Psychiatric/Behavioral: Negative.       Objective:   Physical Exam  Constitutional: She is oriented to person, place, and time. She appears well-developed and well-nourished. No distress.  HENT:  Head: Normocephalic and atraumatic.  Right Ear: External ear normal.  Left Ear: External ear normal.  Eyes: Pupils are  equal, round, and reactive to light. Conjunctivae and EOM are normal.  Neck: Normal range of motion.  Cardiovascular: Normal rate, regular rhythm, normal heart sounds and intact distal pulses.  Pulses:      Radial pulses are 2+ on the right side, and 2+ on the left side.  Pulmonary/Chest: Effort normal and breath sounds normal.  Abdominal: Soft. Bowel sounds are normal.  Musculoskeletal: Normal range of motion. She exhibits no edema.  Neurological: She is alert and oriented to person, place, and time.  Skin: Skin is warm and dry. She is not diaphoretic.  Psychiatric: She has a normal mood and affect. Her behavior is normal. Judgment and thought content normal.  Vitals reviewed.  BP 135/78 (BP Location: Right Arm, Patient Position: Sitting)   Pulse 72   Resp 17   Ht 5' 6.5" (1.689 m)   Wt 119 lb (54 kg)   BMI 18.92 kg/m   Past Medical History:  Diagnosis Date  . AAA (abdominal aortic aneurysm) ([place])   . Abdominal aneurysm without mention of rupture   . Atrioventricular block, complete ([place])   . Cardiac pacemaker in situ   . Gout   . Hernia   . Hyperlipidemia   . Hypertension    Social History   Socioeconomic History  . Marital status: Widowed    Spouse name: Not on file  . Number of children: 2  . Years of education: Not on file  . Highest education level: Not on file  Occupational History  . Occupation: Retired    Fish farm manager: RETIRED  Social Needs  . Financial resource strain: Not  on file  . Food insecurity:    Worry: Not on file    Inability: Not on file  . Transportation needs:    Medical: Not on file    Non-medical: Not on file  Tobacco Use  . Smoking status: Former Smoker    Packs/day: 1.50    Years: 50.00    Pack years: 75.00    Types: Cigarettes    Last attempt to quit: 10/03/1995    Years since quitting: 22.2  . Smokeless tobacco: Never Used  Substance and Sexual Activity  . Alcohol use: Yes    Alcohol/week: 1.0 standard drinks    Types: 1 Glasses of  wine per week    Comment: per week  . Drug use: No  . Sexual activity: Not Currently  Lifestyle  . Physical activity:    Days per week: Not on file    Minutes per session: Not on file  . Stress: Not on file  Relationships  . Social connections:    Talks on phone: Not on file    Gets together: Not on file    Attends religious service: Not on file    Active member of club or organization: Not on file    Attends meetings of clubs or organizations: Not on file    Relationship status: Not on file  . Intimate partner violence:    Fear of current or ex partner: Not on file    Emotionally abused: Not on file    Physically abused: Not on file    Forced sexual activity: Not on file  Other Topics Concern  . Not on file  Social History Narrative   Born in [place]. Lives in [place]. Son lives nearby.      No regular exercise    Past Surgical History:  Procedure Laterality Date  . ABDOMINAL AORTIC ANEURYSM REPAIR    . HERNIA REPAIR    . ohter     growth on colon surgery  . PPM GENERATOR CHANGEOUT N/A 02/25/2017   Procedure: PPM GENERATOR CHANGEOUT;  Surgeon: Deboraha Sprang, MD;  Location: [place];  Service: Cardiovascular;  Laterality: N/A;  . RENAL ANGIOGRAPHY Right 12/03/2017   Procedure: RENAL ANGIOGRAPHY;  Surgeon: Katha Cabal, MD;  Location: [place] [facility];  Service: Cardiovascular;  Laterality: Right;  . TONSILLECTOMY     Family History  Problem Relation Age of Onset  . Heart disease Father   . Alcohol abuse Father   . Cancer Brother    Allergies  Allergen Reactions  . Codeine Other (See Comments)    Made her feel crazy  . Morphine Other (See Comments)    Made her feel crazy  . Zyrtec [Cetirizine] Other (See Comments)    Causes facial numbness      Assessment & Plan:  Patient presents for her first post procedure follow-up.  The patient is status post a ultrasound guidance for vascular access right femoral artery, catheter placement into right  renal artery from right femoral approach, aortogram and selective right renal angiogram, balloon expandable stent placement to 7 mm with a 6 mm diameter x 16 mm length lifestream with starClose closure device right femoral artery on 12/03/17.  The patient presents today without complaint.  Patient's access site has healed well.  Patient denies any flank pain, fever, nausea vomiting.  The patient underwent a bilateral renal duplex which was notable for patent right renal artery stents without any significant stenosis.  Normal right size kidney.  Right  renal vein flow present.  Left renal artery occlusion which correlates with previous angiogram on December 03, 2017 with collateral flow visualized.  Abnormal size for the left kidney.  When compared to the previous exam on October 28, 2017 there is an improvement in the arterial patency to the right kidney left renal artery is stable.  1. Renal artery stenosis ([place]) - Stable Patient presents today without complaint. Exam is unremarkable There is an improvement in arterial patency to the right renal artery status post a recent right renal artery angiogram with angioplasty and stent placement on December 03, 2017 The patient is to continue taking her Plavix The patient is to follow-up in 3 months with a repeat renal duplex I have discussed with the patient at length the risk factors for and pathogenesis of atherosclerotic disease and encouraged a healthy diet, regular exercise regimen and blood pressure / glucose control.  Patient was instructed to contact our office in the interim with problems such as increasing / uncontrollable hypertension or changes in kidney function. The patient expresses their understanding.  - VAS US RENAL ARTERY DUPLEX; Future  2. Mixed hyperlipidemia - Stable On Statin, Plavix and Eliquis. Encouraged good control as its slows the progression of atherosclerotic disease  3. HYPERTENSION, BENIGN - Stable Encouraged good control  as its slows the progression of atherosclerotic disease  Current Outpatient Medications on File Prior to Visit  Medication Sig Dispense Refill  . acetaminophen (TYLENOL) 500 MG tablet Take 500 mg by mouth 2 (two) times daily as needed for moderate pain or headache.     . albuterol (PROAIR HFA) 108 (90 Base) MCG/ACT inhaler Inhale 2 puffs into the lungs every 6 (six) hours as needed for wheezing or shortness of breath. (Patient not taking: Reported on 12/03/2017) 1 Inhaler 5  . amLODipine (NORVASC) 5 MG tablet Take 1 tablet (5 mg total) by mouth daily. 90 tablet 3  . atenolol (TENORMIN) 25 MG tablet Take 1 tablet (25 mg total) by mouth daily. 90 tablet 3  . cholecalciferol 25 MCG (1000 UT) TABS Take 1,000 Units by mouth daily.    . clopidogrel (PLAVIX) 75 MG tablet Take 1 tablet (75 mg total) by mouth daily. 30 tablet 11  . ELIQUIS 2.5 MG TABS tablet TAKE 1 TABLET BY MOUTH TWICE A DAY 60 tablet 6  . polyethylene glycol (MIRALAX / GLYCOLAX) packet Take 17 g by mouth daily as needed for mild constipation. 14 each 0  . rosuvastatin (CRESTOR) 5 MG tablet Take 1 tablet (5 mg total) by mouth daily. (Patient taking differently: Take 5 mg by mouth every evening. ) 90 tablet 3  . SPIRIVA HANDIHALER 18 MCG inhalation capsule INHALE ONE CAPSULE AS DIRECTED ONCE A DAY (Patient not taking: Reported on 11/27/2017) 60 capsule 10  . vitamin B-12 (CYANOCOBALAMIN) 1000 MCG tablet Take 1,000 mcg by mouth at bedtime.     No current facility-administered medications on file prior to visit.    There are no Patient Instructions on file for this visit. No follow-ups on file.  Lilienne Weins A Paiten Boies, PA-C

## 2017-12-23 ENCOUNTER — Encounter: Payer: Medicare Other | Admitting: Internal Medicine

## 2017-12-23 ENCOUNTER — Institutional Professional Consult (permissible substitution): Payer: Medicare Other | Admitting: Radiation Oncology

## 2018-01-20 ENCOUNTER — Other Ambulatory Visit: Payer: Self-pay

## 2018-01-20 ENCOUNTER — Encounter: Payer: Self-pay | Admitting: Emergency Medicine

## 2018-01-20 DIAGNOSIS — E785 Hyperlipidemia, unspecified: Secondary | ICD-10-CM | POA: Insufficient documentation

## 2018-01-20 DIAGNOSIS — K625 Hemorrhage of anus and rectum: Secondary | ICD-10-CM | POA: Diagnosis not present

## 2018-01-20 DIAGNOSIS — I4892 Unspecified atrial flutter: Secondary | ICD-10-CM | POA: Insufficient documentation

## 2018-01-20 DIAGNOSIS — Z79899 Other long term (current) drug therapy: Secondary | ICD-10-CM | POA: Insufficient documentation

## 2018-01-20 DIAGNOSIS — K644 Residual hemorrhoidal skin tags: Secondary | ICD-10-CM | POA: Insufficient documentation

## 2018-01-20 DIAGNOSIS — N183 Chronic kidney disease, stage 3 (moderate): Secondary | ICD-10-CM | POA: Insufficient documentation

## 2018-01-20 DIAGNOSIS — I129 Hypertensive chronic kidney disease with stage 1 through stage 4 chronic kidney disease, or unspecified chronic kidney disease: Secondary | ICD-10-CM | POA: Insufficient documentation

## 2018-01-20 DIAGNOSIS — Z7902 Long term (current) use of antithrombotics/antiplatelets: Secondary | ICD-10-CM | POA: Diagnosis not present

## 2018-01-20 DIAGNOSIS — M109 Gout, unspecified: Secondary | ICD-10-CM | POA: Insufficient documentation

## 2018-01-20 DIAGNOSIS — Z87891 Personal history of nicotine dependence: Secondary | ICD-10-CM | POA: Diagnosis not present

## 2018-01-20 DIAGNOSIS — Z800 Family history of malignant neoplasm of digestive organs: Secondary | ICD-10-CM | POA: Diagnosis not present

## 2018-01-20 DIAGNOSIS — I2510 Atherosclerotic heart disease of native coronary artery without angina pectoris: Secondary | ICD-10-CM | POA: Insufficient documentation

## 2018-01-20 DIAGNOSIS — Z8249 Family history of ischemic heart disease and other diseases of the circulatory system: Secondary | ICD-10-CM | POA: Diagnosis not present

## 2018-01-20 DIAGNOSIS — I714 Abdominal aortic aneurysm, without rupture: Secondary | ICD-10-CM | POA: Diagnosis not present

## 2018-01-20 DIAGNOSIS — I4821 Permanent atrial fibrillation: Secondary | ICD-10-CM | POA: Diagnosis not present

## 2018-01-20 DIAGNOSIS — Z7901 Long term (current) use of anticoagulants: Secondary | ICD-10-CM | POA: Diagnosis not present

## 2018-01-20 DIAGNOSIS — J449 Chronic obstructive pulmonary disease, unspecified: Secondary | ICD-10-CM | POA: Insufficient documentation

## 2018-01-20 DIAGNOSIS — K922 Gastrointestinal hemorrhage, unspecified: Principal | ICD-10-CM | POA: Insufficient documentation

## 2018-01-20 DIAGNOSIS — K921 Melena: Secondary | ICD-10-CM | POA: Diagnosis not present

## 2018-01-20 DIAGNOSIS — Z950 Presence of cardiac pacemaker: Secondary | ICD-10-CM | POA: Insufficient documentation

## 2018-01-20 DIAGNOSIS — I442 Atrioventricular block, complete: Secondary | ICD-10-CM | POA: Insufficient documentation

## 2018-01-20 DIAGNOSIS — I10 Essential (primary) hypertension: Secondary | ICD-10-CM | POA: Diagnosis not present

## 2018-01-20 LAB — COMPREHENSIVE METABOLIC PANEL
ALT: 14 U/L (ref 0–44)
AST: 20 U/L (ref 15–41)
Albumin: 4.2 g/dL (ref 3.5–5.0)
Alkaline Phosphatase: 87 U/L (ref 38–126)
Anion gap: 8 (ref 5–15)
BUN: 27 mg/dL — ABNORMAL HIGH (ref 8–23)
CO2: 30 mmol/L (ref 22–32)
Calcium: 9.6 mg/dL (ref 8.9–10.3)
Chloride: 100 mmol/L (ref 98–111)
Creatinine, Ser: 1.35 mg/dL — ABNORMAL HIGH (ref 0.44–1.00)
GFR calc Af Amer: 41 mL/min — ABNORMAL LOW (ref >60)
GFR calc non Af Amer: 35 mL/min — ABNORMAL LOW (ref >60)
Glucose, Bld: 108 mg/dL — ABNORMAL HIGH (ref 70–99)
Potassium: 3.7 mmol/L (ref 3.5–5.1)
Sodium: 138 mmol/L (ref 135–145)
Total Bilirubin: 1 mg/dL (ref 0.3–1.2)
Total Protein: 7 g/dL (ref 6.5–8.1)

## 2018-01-20 LAB — CBC
HCT: 39.7 % (ref 36.0–46.0)
Hemoglobin: 12.7 g/dL (ref 12.0–15.0)
MCH: 29.5 pg (ref 26.0–34.0)
MCHC: 32 g/dL (ref 30.0–36.0)
MCV: 92.3 fL (ref 80.0–100.0)
Platelets: 150 10*3/uL (ref 150–400)
RBC: 4.3 MIL/uL (ref 3.87–5.11)
RDW: 13.7 % (ref 11.5–15.5)
WBC: 5.8 10*3/uL (ref 4.0–10.5)
nRBC: 0 % (ref 0.0–0.2)

## 2018-01-20 LAB — TYPE AND SCREEN
ABO/RH(D): O POS
Antibody Screen: NEGATIVE

## 2018-01-20 NOTE — ED Triage Notes (Signed)
Pt to triage via w/c with no distress noted; pt reports rectal bleeding since last night; denies any pain, denies hx of; currently taking Eliquist

## 2018-01-21 ENCOUNTER — Observation Stay
Admission: EM | Admit: 2018-01-21 | Discharge: 2018-01-22 | Disposition: A | Discharge status: Home / Self Care | Payer: Medicare Other | Attending: Internal Medicine | Admitting: Internal Medicine

## 2018-01-21 ENCOUNTER — Other Ambulatory Visit: Payer: Self-pay

## 2018-01-21 DIAGNOSIS — I4821 Permanent atrial fibrillation: Secondary | ICD-10-CM | POA: Diagnosis not present

## 2018-01-21 DIAGNOSIS — K922 Gastrointestinal hemorrhage, unspecified: Secondary | ICD-10-CM | POA: Diagnosis present

## 2018-01-21 DIAGNOSIS — K921 Melena: Secondary | ICD-10-CM

## 2018-01-21 DIAGNOSIS — I714 Abdominal aortic aneurysm, without rupture: Secondary | ICD-10-CM | POA: Diagnosis not present

## 2018-01-21 DIAGNOSIS — K625 Hemorrhage of anus and rectum: Secondary | ICD-10-CM | POA: Diagnosis not present

## 2018-01-21 DIAGNOSIS — I10 Essential (primary) hypertension: Secondary | ICD-10-CM | POA: Diagnosis not present

## 2018-01-21 DIAGNOSIS — N183 Chronic kidney disease, stage 3 (moderate): Secondary | ICD-10-CM | POA: Diagnosis not present

## 2018-01-21 DIAGNOSIS — Z7901 Long term (current) use of anticoagulants: Secondary | ICD-10-CM

## 2018-01-21 LAB — PROTIME-INR
INR: 1.04
Prothrombin Time: 13.5 seconds (ref 11.4–15.2)

## 2018-01-21 LAB — TSH: TSH: 1.305 u[IU]/mL (ref 0.350–4.500)

## 2018-01-21 LAB — HEMOGLOBIN AND HEMATOCRIT, BLOOD
HCT: 36.5 % (ref 36.0–46.0)
HCT: 35.7 % — ABNORMAL LOW (ref 36.0–46.0)
Hemoglobin: 12 g/dL (ref 12.0–15.0)
Hemoglobin: 11.6 g/dL — ABNORMAL LOW (ref 12.0–15.0)

## 2018-01-21 LAB — APTT: aPTT: 33 seconds (ref 24–36)

## 2018-01-21 MED ORDER — ONDANSETRON HCL 4 MG/2ML IJ SOLN: 4.0000 mg | Freq: Four times a day (QID) | INTRAMUSCULAR | Status: DC | PRN

## 2018-01-21 MED ORDER — ENSURE ENLIVE PO LIQD: 237.0000 mL | Freq: Two times a day (BID) | ORAL | Status: DC

## 2018-01-21 MED ORDER — ACETAMINOPHEN 650 MG RE SUPP: 650.0000 mg | Freq: Four times a day (QID) | RECTAL | Status: DC | PRN

## 2018-01-21 MED ORDER — POLYETHYLENE GLYCOL 3350 17 G PO PACK: 17.0000 g | PACK | Freq: Every day | ORAL | Status: DC | PRN

## 2018-01-21 MED ORDER — SODIUM CHLORIDE 0.9 % IV SOLN
INTRAVENOUS | Status: DC
  Administered 2018-01-21 – 2018-01-22 (×3): via INTRAVENOUS

## 2018-01-21 MED ORDER — ACETAMINOPHEN 325 MG PO TABS: 650.0000 mg | ORAL_TABLET | Freq: Four times a day (QID) | ORAL | Status: DC | PRN

## 2018-01-21 MED ORDER — ONDANSETRON HCL 4 MG PO TABS: 4.0000 mg | ORAL_TABLET | Freq: Four times a day (QID) | ORAL | Status: DC | PRN

## 2018-01-21 MED ORDER — ALBUTEROL SULFATE (2.5 MG/3ML) 0.083% IN NEBU: 2.5000 mg | INHALATION_SOLUTION | RESPIRATORY_TRACT | Status: DC | PRN

## 2018-01-21 NOTE — ED Notes (Addendum)
Patient states bleeding started 12/29 during the evening. Then on 12/30 in the A.M. had BM and bleeding stopped. Then before patient came to ED the bleeding started again. Patient states she has had to change pad three times before coming to ED. Patient states when going to restroom blood runs down leg. Patient denies any pain. Pad changed here after getting into room, pad had moderate amount of bright red blood. Patient was cleaned and had sizable clots.

## 2018-01-21 NOTE — ED Notes (Signed)
EDP in with patient 

## 2018-01-21 NOTE — Consult Note (Signed)
Cardiology Consultation:   Patient ID: Jessica [name] MRN: [id number]; DOB: [birth year]  Admit date: 01/21/2018 Date of Consult: 01/21/2018  Primary Care Provider: Leone Haven, MD Primary Cardiologist: Dr. Rockey Situ Primary Electrophysiologist:  Jolyn Nap, MD  Patient Profile:   Jessica [name] is a 84 y.o. female with a hx of PPM (St. Jude, V paced) implantation due to complete heart block, chronic atrial fibrillation on anticoagulation with Eliquis, abdominal aortic aneurysm (s/p open abdominal aortic aneurysm repaired 2009), HLD, HTN, PVD, carotid stenosis, renal artery aneurysm/stenosis and followed by vascular surgery s/p 11/2017 renal artery stent, CKD3, COPD, prior history of smoking (quit ~1997), gout, hernia, memory issues, and acute rectal bleed who is being seen today for the evaluation of anticoagulation prior to hemorrhoidectomy at the request of Dr. Estanislado Pandy.  History of Present Illness:   Ms. Jessica [name] is an 84 year old female with PMH as above.  Of note, 12/03/17 renal artery stent placed with Plavix started at that time in addition to chronic anticoagulation with Eliquis for permanent atrial fibrillation.  She is s/p Saint Jude PPM implantation with pacemaker generator change out 02/25/2017 and followed by Dr. Caryl Comes. Her last remote device check was completed 08/29/2017 and showed she is  ventricularly paced most of the time with high atrial tach/atrial fibrillation burden and therefore higher stroke risk. Patient remains on anticoagulation with Eliquis.    On 01/20/2018, the patient presented to [facility] ED with report of moderate bright red rectal bleeding that was sudden in onset and originally noted while urinating.   In the ED, patient was found to be hemodynamically stable but admitted for further observation given hematochezia and comorbid conditions on anticoagulation Vitals: BP 153/68, HR 70, RR 18, T 90 8.92F, SPO2 95% Labs: Glucose 108, BUN 27, creatinine 1.35, K 3.7, Na 138,  Ca 9.6, alk phos 87, AST 20, ALT 14, albumin 4.2, WBC 5.8, hemoglobin 12.7, hematocrit 39.7, platelets 150 EKG: Atrial fibrillation with ventricular paced rhythm and underlying complete heart block, ventricular rate 70 bpm   She was admitted for rectal bleeding and subsequently scheduled for hemorrhoidectomy per GI. Cardiology was consulted for management of her anticoagulation prior to the procedure as PTA medications included Plavix and Eliquis. Of note, cardiology is able to comment regarding Eliquis anticoagulation for atrial fibrillation; however, the patient was started on Plavix for a renal artery stent performed on 12/03/2017 by vascular surgery, and we will defer to them for management of Plavix.  During today's consultation, the patient denied orthopnea and other associated symptoms of heart failure. She denied chest pain with exertion.  No reported current cardiac symptoms or feelings of presyncope/dizziness.  No other reported history of bleeding before the current rectal bleed as reported above.  She reported compliance with her medications and confirmed she is followed by Dr. Rockey Situ of [facility] cardiology and Dr. Caryl Comes for her pacemaker. On review of telemetry, patient is ventricularly paced in sinus rhythm at rate 70 -72 bpm.   Past Medical History:  Diagnosis Date  . AAA (abdominal aortic aneurysm) ([place])   . Abdominal aneurysm without mention of rupture   . Atrioventricular block, complete ([place])   . Cardiac pacemaker in situ   . Gout   . Hernia   . Hyperlipidemia   . Hypertension     Past Surgical History:  Procedure Laterality Date  . ABDOMINAL AORTIC ANEURYSM REPAIR    . HERNIA REPAIR    . ohter     growth on colon surgery  . PPM  GENERATOR CHANGEOUT N/A 02/25/2017   Procedure: PPM GENERATOR CHANGEOUT;  Surgeon: Deboraha Sprang, MD;  Location: [place] CV LAB;  Service: Cardiovascular;  Laterality: N/A;  . RENAL ANGIOGRAPHY Right 12/03/2017   Procedure: RENAL ANGIOGRAPHY;   Surgeon: Katha Cabal, MD;  Location: [place] CV LAB;  Service: Cardiovascular;  Laterality: Right;  . TONSILLECTOMY       Home Medications:  Prior to Admission medications   Medication Sig Start Date End Date Taking? Authorizing Provider  acetaminophen (TYLENOL) 500 MG tablet Take 500 mg by mouth 2 (two) times daily as needed for moderate pain or headache.    Yes [provider]  albuterol (PROAIR HFA) 108 (90 Base) MCG/ACT inhaler Inhale 2 puffs into the lungs every 6 (six) hours as needed for wheezing or shortness of breath. 01/26/16  Yes Wilhelmina Mcardle, MD  amLODipine (NORVASC) 5 MG tablet Take 1 tablet (5 mg total) by mouth daily. 07/31/17  Yes Minna Merritts, MD  atenolol (TENORMIN) 25 MG tablet Take 1 tablet (25 mg total) by mouth daily. 07/31/17  Yes Gollan, Kathlene November, MD  cholecalciferol 25 MCG (1000 UT) TABS Take 1,000 Units by mouth daily.   Yes [provider]  clopidogrel (PLAVIX) 75 MG tablet Take 1 tablet (75 mg total) by mouth daily. 12/04/17  Yes Schnier, [name], MD  ELIQUIS 2.5 MG TABS tablet TAKE 1 TABLET BY MOUTH TWICE A DAY 11/18/17  Yes Deboraha Sprang, MD  polyethylene glycol [employer] / GLYCOLAX) packet Take 17 g by mouth daily as needed for mild constipation. 04/06/17  Yes Fritzi Mandes, MD  rosuvastatin (CRESTOR) 5 MG tablet Take 1 tablet (5 mg total) by mouth daily. Patient taking differently: Take 5 mg by mouth every evening.  07/31/17  Yes Gollan, Kathlene November, MD  vitamin B-12 (CYANOCOBALAMIN) 1000 MCG tablet Take 1,000 mcg by mouth at bedtime.   Yes [provider]    Inpatient Medications: Scheduled Meds: . feeding supplement (ENSURE ENLIVE)  237 mL Oral BID BM   Continuous Infusions: . sodium chloride 90 mL/hr at 01/21/18 1606   PRN Meds: acetaminophen **OR** acetaminophen, albuterol, ondansetron **OR** ondansetron (ZOFRAN) IV, polyethylene glycol  Allergies:    Allergies  Allergen Reactions  . Codeine Other (See  Comments)    Made her feel crazy  . Morphine Other (See Comments)    Made her feel crazy  . Zyrtec [Cetirizine] Other (See Comments)    Causes facial numbness    Social History:   Social History   Socioeconomic History  . Marital status: Widowed    Spouse name: Not on file  . Number of children: 2  . Years of education: Not on file  . Highest education level: Not on file  Occupational History  . Occupation: Retired    Fish farm manager: RETIRED  Social Needs  . Financial resource strain: Not on file  . Food insecurity:    Worry: Not on file    Inability: Not on file  . Transportation needs:    Medical: Not on file    Non-medical: Not on file  Tobacco Use  . Smoking status: Former Smoker    Packs/day: 1.50    Years: 50.00    Pack years: 75.00    Types: Cigarettes    Last attempt to quit: 10/03/1995    Years since quitting: 22.3  . Smokeless tobacco: Never Used  Substance and Sexual Activity  . Alcohol use: Yes    Alcohol/week: 1.0 standard drinks  Types: 1 Glasses of wine per week    Comment: per week  . Drug use: No  . Sexual activity: Not Currently  Lifestyle  . Physical activity:    Days per week: Not on file    Minutes per session: Not on file  . Stress: Not on file  Relationships  . Social connections:    Talks on phone: Not on file    Gets together: Not on file    Attends religious service: Not on file    Active member of club or organization: Not on file    Attends meetings of clubs or organizations: Not on file    Relationship status: Not on file  . Intimate partner violence:    Fear of current or ex partner: Not on file    Emotionally abused: Not on file    Physically abused: Not on file    Forced sexual activity: Not on file  Other Topics Concern  . Not on file  Social History Narrative   Born in [place]. Lives in [place]. Son lives nearby.      No regular exercise     Family History:    Family History  Problem Relation Age of Onset  . Heart  disease Father   . Alcohol abuse Father   . Cancer Brother      ROS:  Please see the history of present illness.  Review of Systems  Cardiovascular: Positive for chest pain. Negative for palpitations and orthopnea.  Gastrointestinal:       Hematochezia  Endo/Heme/Allergies:       Rectal bleed No other bleeding reported  All other systems reviewed and are negative.  All other ROS reviewed and negative.     Physical Exam/Data:   Vitals:   01/21/18 0245 01/21/18 0300 01/21/18 0329 01/21/18 1142  BP:  (!) 144/77 (!) 162/64 140/72  Pulse: 70 69 70 70  Resp: (!) 21 (!) 40 18 (!) 24  Temp:   97.6 F (36.4 C) 97.8 F (36.6 C)  TempSrc:   Oral Oral  SpO2: 93% 93% 98% 94%  Weight:      Height:        Intake/Output Summary (Last 24 hours) at 01/21/2018 1616 Last data filed at 01/21/2018 1415 Gross per 24 hour  Intake 0 ml  Output 1300 ml  Net -1300 ml   Filed Weights   01/20/18 2104  Weight: 54.4 kg   Body mass index is 18.79 kg/m.  General:  Frail elderly woman in no acute distress HEENT: normal Neck: no JVD Endocrine:  No thryomegaly Cardiac:  ventricularly paced regular rhythm, regular rate; no murmur  Lungs:  clear to auscultation bilaterally, no wheezing, rhonchi or rales  Abd: soft, nontender, no hepatomegaly  Ext: no bilateral edema Musculoskeletal:  No deformities Skin: warm and dry  Neuro:  no focal abnormalities noted Psych:  Normal affect   EKG: Refer to HPI Telemetry:  Telemetry was personally reviewed and demonstrates:  Sinus ventricular paced rhythm with ventricular rate of 70-72  CV Studies:   Relevant CV Studies:  09-13-2016 TTE Technically difficult study due to chest wall and/or lung   interference. - Left ventricle: The cavity size was normal. Wall thickness was   increased in a pattern of moderate LVH. Systolic function was   normal. The estimated ejection fraction was in the range of 60%   to 65%. Regional wall motion abnormalities  cannot be excluded.   The study is not technically sufficient to allow  evaluation of LV   diastolic function. - Aortic valve: Poorly visualized. Moderately thickened, moderately   calcified leaflets. Sclerosis without stenosis. - Left atrium: The atrium was mildly dilated. - Right ventricle: The cavity size was normal. Pacer wire or   catheter noted in right ventricle. - Atrial septum: There was increased thickness of the septum,   consistent with lipomatous hypertrophy. - Tricuspid valve: There was mild regurgitation. - Pulmonary arteries: Systolic pressure was mildly to moderately   increased, in the range of 40 mm Hg to 45 mm Hg.  Laboratory Data:  Chemistry Recent Labs  Lab 01/20/18 2108  NA 138  K 3.7  CL 100  CO2 30  GLUCOSE 108*  BUN 27*  CREATININE 1.35*  CALCIUM 9.6  GFRNONAA 35*  GFRAA 41*  ANIONGAP 8    Recent Labs  Lab 01/20/18 2108  PROT 7.0  ALBUMIN 4.2  AST 20  ALT 14  ALKPHOS 87  BILITOT 1.0   Hematology Recent Labs  Lab 01/20/18 2108  WBC 5.8  RBC 4.30  HGB 12.7  HCT 39.7  MCV 92.3  MCH 29.5  MCHC 32.0  RDW 13.7  PLT 150   Cardiac EnzymesNo results for input(s): TROPONINI in the last 168 hours. No results for input(s): TROPIPOC in the last 168 hours.  BNPNo results for input(s): BNP, PROBNP in the last 168 hours.  DDimer No results for input(s): DDIMER in the last 168 hours.  Radiology/Studies:  No results found.  Assessment and Plan:   Rectal Bleeding on chronic Anticoagulation for permanent Afib and h/o complete HB s/p PPM (St. Jude) - Cardiology consulted for management of anticoagulation. Patient denies acute heart failure symptoms or any recent cardiac complaints as listed above in HPI. - Calculated RCRI risk for complications is 0 and interpreted as class I risk with 0.4% risk of major cardiac event. Patient is not on insulin and creatinine remains below 2. No history of stroke, ICM/CHF, or current chest pain and procedure  classified as low risk. Estimated perioperative risk of MACE based on combined clinical and surgical risk is that of low risk and no further cardiac testing needed.  - Acceptable to proceed to surgery following 48 hour washout of Eliquis. Recommend consult vascular surgery regarding Plavix management; we will defer management to them given recent stenting of renal artery. Continue to monitor hemoglobin and hematocrit with daily CBC. Recommend restart anticoagulation s/p procedure as soon as possible and per surgical team, given risk of stroke:  CHA2DS2VASc score of at least 5 (HTN, vascular disease,age x2, female). Most recent device check shows high atrial fibrillation burden and that patient almost always ventricularly paced, further increasing risk of stroke.   PVD / Renal artery stenosis  - Defer management of Plavix to vascular surgery as recent 11/2017 renal artery stent   CKDIII - Daily BMET. Avoid nephrotoxins  For questions or updates, please contact [place] Please consult [email or web address] for contact info under     Signed, Arvil Chaco, PA-C  01/21/2018 4:16 PM

## 2018-01-21 NOTE — ED Notes (Signed)
This Probation officer assisted patient to restroom. Patient had very little bleeding. Will continue to monitor.

## 2018-01-21 NOTE — Consult Note (Signed)
Vonda Antigua, MD [address], [place], [place], [place], [address] [address], [place], [place], [place], [address] Phone: [phone]  Fax: [phone]  Consultation  Referring Provider:     Dr. Estanislado Pandy Primary Care Physician:  Leone Haven, MD Reason for Consultation:     Bright red blood per rectum  Date of Admission:  01/21/2018 Date of Consultation:  01/21/2018         HPI:   Jessica [name] is a 84 y.o. female with 2 to 3-day history of hematochezia at home.  Reports last episode was this morning but less than before.  Reports multiple episodes of the same without clots.  Reports constipation chronically and does not have a bowel movement for 3 to 4 days and will strain when she has a bowel movement.  Reports episode of previous bleeding over 10 to 15 years ago from hemorrhoids but did not need to have any interventions done on them at that time.  No nausea or vomiting, no abdominal pain, no hemodynamic changes.  Hemoglobin 12.7 on admission.  No prior EGDs.  Last colonoscopy was in September 2009 with a diminutive ascending colon polyp removed and diverticulosis reported.  Past Medical History:  Diagnosis Date  . AAA (abdominal aortic aneurysm) ([place])   . Abdominal aneurysm without mention of rupture   . Atrioventricular block, complete ([place])   . Cardiac pacemaker in situ   . Gout   . Hernia   . Hyperlipidemia   . Hypertension     Past Surgical History:  Procedure Laterality Date  . ABDOMINAL AORTIC ANEURYSM REPAIR    . HERNIA REPAIR    . ohter     growth on colon surgery  . PPM GENERATOR CHANGEOUT N/A 02/25/2017   Procedure: PPM GENERATOR CHANGEOUT;  Surgeon: Deboraha Sprang, MD;  Location: [place] CV LAB;  Service: Cardiovascular;  Laterality: N/A;  . RENAL ANGIOGRAPHY Right 12/03/2017   Procedure: RENAL ANGIOGRAPHY;  Surgeon: Katha Cabal, MD;  Location: [place] CV LAB;  Service: Cardiovascular;  Laterality: Right;  .  TONSILLECTOMY      Prior to Admission medications   Medication Sig Start Date End Date Taking? Authorizing Provider  acetaminophen (TYLENOL) 500 MG tablet Take 500 mg by mouth 2 (two) times daily as needed for moderate pain or headache.    Yes [provider]  albuterol (PROAIR HFA) 108 (90 Base) MCG/ACT inhaler Inhale 2 puffs into the lungs every 6 (six) hours as needed for wheezing or shortness of breath. 01/26/16  Yes Wilhelmina Mcardle, MD  amLODipine (NORVASC) 5 MG tablet Take 1 tablet (5 mg total) by mouth daily. 07/31/17  Yes Minna Merritts, MD  atenolol (TENORMIN) 25 MG tablet Take 1 tablet (25 mg total) by mouth daily. 07/31/17  Yes Gollan, Kathlene November, MD  cholecalciferol 25 MCG (1000 UT) TABS Take 1,000 Units by mouth daily.   Yes [provider]  clopidogrel (PLAVIX) 75 MG tablet Take 1 tablet (75 mg total) by mouth daily. 12/04/17  Yes Schnier, [name], MD  ELIQUIS 2.5 MG TABS tablet TAKE 1 TABLET BY MOUTH TWICE A DAY 11/18/17  Yes Deboraha Sprang, MD  polyethylene glycol [facility] / GLYCOLAX) packet Take 17 g by mouth daily as needed for mild constipation. 04/06/17  Yes Fritzi Mandes, MD  rosuvastatin (CRESTOR) 5 MG tablet Take 1 tablet (5 mg total) by mouth daily. Patient taking differently: Take 5 mg by mouth every evening.  07/31/17  Yes Minna Merritts, MD  vitamin B-12 (CYANOCOBALAMIN) 1000 MCG tablet Take 1,000 mcg by mouth at bedtime.   Yes [provider]    Family History  Problem Relation Age of Onset  . Heart disease Father   . Alcohol abuse Father   . Cancer Brother      Social History   Tobacco Use  . Smoking status: Former Smoker    Packs/day: 1.50    Years: 50.00    Pack years: 75.00    Types: Cigarettes    Last attempt to quit: 10/03/1995    Years since quitting: 22.3  . Smokeless tobacco: Never Used  Substance Use Topics  . Alcohol use: Yes    Alcohol/week: 1.0 standard drinks    Types: 1 Glasses of wine per week    Comment:  per week  . Drug use: No    Allergies as of 01/20/2018 - Review Complete 01/20/2018  Allergen Reaction Noted  . Codeine Other (See Comments)   . Morphine Other (See Comments)   . Zyrtec [cetirizine] Other (See Comments) 01/05/2013    Review of Systems:    All systems reviewed and negative except where noted in HPI.   Physical Exam:  Vital signs in last 24 hours: Vitals:   01/21/18 0245 01/21/18 0300 01/21/18 0329 01/21/18 1142  BP:  (!) 144/77 (!) 162/64 140/72  Pulse: 70 69 70 70  Resp: (!) 21 (!) 40 18 (!) 24  Temp:   97.6 F (36.4 C) 97.8 F (36.6 C)  TempSrc:   Oral Oral  SpO2: 93% 93% 98% 94%  Weight:      Height:       Last BM Date: 01/20/18 General:   Pleasant, cooperative in NAD Head:  Normocephalic and atraumatic. Eyes:   No icterus.   Conjunctiva pink. PERRLA. Ears:  Normal auditory acuity. Neck:  Supple; no masses or thyroidomegaly Lungs: Respirations even and unlabored. Lungs clear to auscultation bilaterally.   No wheezes, crackles, or rhonchi.  Abdomen:  Soft, nondistended, nontender. Normal bowel sounds. No appreciable masses or hepatomegaly.  No rebound or guarding.  Rectal exam: Red blood seen around perianal area, external hemorrhoids present.  One external hemorrhoid with bright red with residual small clot next to it.  No tenderness present.  Digital rectal exam with solid stool in rectum. Neurologic:  Alert and oriented x3;  grossly normal neurologically. Skin:  Intact without significant lesions or rashes. Cervical Nodes:  No significant cervical adenopathy. Psych:  Alert and cooperative. Normal affect.  LAB RESULTS: Recent Labs    01/20/18 2108  WBC 5.8  HGB 12.7  HCT 39.7  PLT 150   BMET Recent Labs    01/20/18 2108  NA 138  K 3.7  CL 100  CO2 30  GLUCOSE 108*  BUN 27*  CREATININE 1.35*  CALCIUM 9.6   LFT Recent Labs    01/20/18 2108  PROT 7.0  ALBUMIN 4.2  AST 20  ALT 14  ALKPHOS 87  BILITOT 1.0   PT/INR Recent Labs     01/21/18 0410  LABPROT 13.5  INR 1.04    STUDIES: No results found.    Impression / Plan:   Jessica [name] is a 84 y.o. y/o female with hematochezia at home, with rectal exam showing bright red external hemorrhoid with residual old blood clot next to it  Patient's normal hemoglobin, no changes in hemodynamic status, and above rectal exam findings are consistent with her hematochezia being from her  external hemorrhoids itself.  I discussed this with the patient extensively along with the family members at bedside.  I would recommend surgery consult for evaluation of external hemorrhoids, given that she is on Eliquis and Plavix with recent vascular intervention and since these medications may need to be continued, as patient may need intervention on her hemorrhoids.  No signs of upper GI bleed  GI bleed from a colonic source is less likely We discussed the option of a colonoscopy with the patient to rule out any colonic sources of GI bleeding, but patient and family would not like to proceed with this at this time.  However, they are willing to proceed if bleeding continues and another source other than the hemorrhoids need to be evaluated.  Would recommend high-fiber diet as her hemorrhoids are a result of her constipation MiraLAX daily  Follow-up in surgery and GI clinic  Please call GI if needed.  Thank you for involving me in the care of this patient.      LOS: 0 days   Virgel Manifold, MD  01/21/2018, 12:29 PM

## 2018-01-21 NOTE — ED Notes (Signed)
Report called  

## 2018-01-21 NOTE — ED Notes (Signed)
This Probation officer assisted EDP with rectal exam.

## 2018-01-21 NOTE — Consult Note (Addendum)
SURGICAL CONSULTATION NOTE (initial) - cpt: 21224  Patient seen and examined as described below with surgical PA-C, Ardell Isaacs.  Assessment/Plan: (ICD-10's: K92.2) In summary, patient is a 84 y.o. female with lower GI bleed attributed to otherwise asymptomatic hemorrhoids combined with therapeutic anticoagulation for chronic atrial fibrillation and recent addition of Plavix for renal artery stent, complicated by pertinent comorbidities including advanced chronological age, HTN, HLD, CAD with complete heart block s/p PPM, AAA s/p open repair (2009), CKD (stage 3), COPD, gout, and former chronic tobacco abuse (smoking).   - therapeutic anticoagulation held as per medical team  - monitor/trend Hb/Hct while off of therapeutic anticoagulation, transfuse prn  - if bleeds despite anticoagulation held, will consider inpatient hemorrhoidectomy  - daily Colace stool softener in addition to maintain adequate hydration and high-fiber diet or dietary fiber supplement  - risk stratification of holding anticoagulation vs hemorrhoidectomy requested along with optimization for possible surgery  - if Hb stable with no or minimal further bleeding while anticoagulation held and able to continue holding, outpatient surgical follow-up  - colonoscopic evaluation as per GI, who states bleeding unlikely secondary to colonic source (mass, diverticular, etc)  - medical management of comorbidities as per primary medical team  - DVT prophylaxis, ambulation encouraged  I have personally reviewed the patient's chart, evaluated/examined the patient, proposed the recommended management, and discussed these recommendations with the patient and her son to their expressed satisfaction as well as with patient's RN and medical physician.  Thank you for the opportunity to participate in this patient's care.  -- Marilynne Drivers Rosana Hoes, MD, [place]: [facility] - Partnering for exceptional  care. Office: [phone]  [facility] SURGICAL CONSULTATION NOTE (initial) - cpt: 88916   HISTORY OF PRESENT ILLNESS (HPI):  84 y.o. female presented to [facility] ED today for evaluation of bright red blood per rectum. Patient reports the onset of bright red blood per rectum since 12/30. She has had a bowel movement since the onset of the rectal bleeding and continues to notice streak un her underwear. She denied any associated pain, nausea, or emesis. She endorses a history of similar bleeding which was associated with hemorrhoids a few years ago however she feels this bleeding is worse. Of note, she had a renal artery stent placed on 11/12 with Dr Delana Meyer and she was started on Plavix at that time in addition to the Eliquis which she takes for atrial fibrillation. Her last colonoscopy was 10 years ago which was not concerning for malignancy. She does have a family history of colon cancer in her brother at age 66.   Surgery is consulted by Dr Estanislado Pandy in this context for evaluation and management of GI bleeding.   PAST MEDICAL HISTORY (PMH):  Past Medical History:  Diagnosis Date  . AAA (abdominal aortic aneurysm) ([place])   . Abdominal aneurysm without mention of rupture   . Atrioventricular block, complete ([place])   . Cardiac pacemaker in situ   . Gout   . Hernia   . Hyperlipidemia   . Hypertension      PAST SURGICAL HISTORY ([place]):  Past Surgical History:  Procedure Laterality Date  . ABDOMINAL AORTIC ANEURYSM REPAIR    . HERNIA REPAIR    . ohter     growth on colon surgery  . PPM GENERATOR CHANGEOUT N/A 02/25/2017   Procedure: PPM GENERATOR CHANGEOUT;  Surgeon: Deboraha Sprang, MD;  Location: [place] [facility];  Service: Cardiovascular;  Laterality: N/A;  .  RENAL ANGIOGRAPHY Right 12/03/2017   Procedure: RENAL ANGIOGRAPHY;  Surgeon: Katha Cabal, MD;  Location: [place] [facility];  Service: Cardiovascular;  Laterality: Right;  . TONSILLECTOMY        MEDICATIONS:  Prior to Admission medications   Medication Sig Start Date End Date Taking? Authorizing Provider  acetaminophen (TYLENOL) 500 MG tablet Take 500 mg by mouth 2 (two) times daily as needed for moderate pain or headache.    Yes [provider]  albuterol (PROAIR HFA) 108 (90 Base) MCG/ACT inhaler Inhale 2 puffs into the lungs every 6 (six) hours as needed for wheezing or shortness of breath. 01/26/16  Yes Wilhelmina Mcardle, MD  amLODipine (NORVASC) 5 MG tablet Take 1 tablet (5 mg total) by mouth daily. 07/31/17  Yes Minna Merritts, MD  atenolol (TENORMIN) 25 MG tablet Take 1 tablet (25 mg total) by mouth daily. 07/31/17  Yes Gollan, Kathlene November, MD  cholecalciferol 25 MCG (1000 UT) TABS Take 1,000 Units by mouth daily.   Yes [provider]  clopidogrel (PLAVIX) 75 MG tablet Take 1 tablet (75 mg total) by mouth daily. 12/04/17  Yes Schnier, [name], MD  ELIQUIS 2.5 MG TABS tablet TAKE 1 TABLET BY MOUTH TWICE A DAY 11/18/17  Yes Deboraha Sprang, MD  polyethylene glycol [facility] / GLYCOLAX) packet Take 17 g by mouth daily as needed for mild constipation. 04/06/17  Yes Fritzi Mandes, MD  rosuvastatin (CRESTOR) 5 MG tablet Take 1 tablet (5 mg total) by mouth daily. Patient taking differently: Take 5 mg by mouth every evening.  07/31/17  Yes Gollan, Kathlene November, MD  vitamin B-12 (CYANOCOBALAMIN) 1000 MCG tablet Take 1,000 mcg by mouth at bedtime.   Yes [provider]     ALLERGIES:  Allergies  Allergen Reactions  . Codeine Other (See Comments)    Made her feel crazy  . Morphine Other (See Comments)    Made her feel crazy  . Zyrtec [Cetirizine] Other (See Comments)    Causes facial numbness     SOCIAL HISTORY:  Social History   Socioeconomic History  . Marital status: Widowed    Spouse name: Not on file  . Number of children: 2  . Years of education: Not on file  . Highest education level: Not on file  Occupational History  . Occupation: Retired     Fish farm manager: RETIRED  Social Needs  . Financial resource strain: Not on file  . Food insecurity:    Worry: Not on file    Inability: Not on file  . Transportation needs:    Medical: Not on file    Non-medical: Not on file  Tobacco Use  . Smoking status: Former Smoker    Packs/day: 1.50    Years: 50.00    Pack years: 75.00    Types: Cigarettes    Last attempt to quit: 10/03/1995    Years since quitting: 22.3  . Smokeless tobacco: Never Used  Substance and Sexual Activity  . Alcohol use: Yes    Alcohol/week: 1.0 standard drinks    Types: 1 Glasses of wine per week    Comment: per week  . Drug use: No  . Sexual activity: Not Currently  Lifestyle  . Physical activity:    Days per week: Not on file    Minutes per session: Not on file  . Stress: Not on file  Relationships  . Social connections:    Talks on phone: Not on file  Gets together: Not on file    Attends religious service: Not on file    Active member of club or organization: Not on file    Attends meetings of clubs or organizations: Not on file    Relationship status: Not on file  . Intimate partner violence:    Fear of current or ex partner: Not on file    Emotionally abused: Not on file    Physically abused: Not on file    Forced sexual activity: Not on file  Other Topics Concern  . Not on file  Social History Narrative   Born in [place]. Lives in [place]. Son lives nearby.      No regular exercise      FAMILY HISTORY:  Family History  Problem Relation Age of Onset  . Heart disease Father   . Alcohol abuse Father   . Cancer Brother       REVIEW OF SYSTEMS:  Review of Systems  Constitutional: Negative for chills and fever.  Respiratory: Negative for cough and sputum production.   Cardiovascular: Negative for chest pain and palpitations.  Gastrointestinal: Positive for blood in stool. Negative for abdominal pain, constipation, diarrhea, melena, nausea and vomiting.  Genitourinary: Negative for  dysuria, hematuria and urgency.  Neurological: Negative for dizziness and weakness.  All other systems reviewed and are negative.   VITAL SIGNS:  Temp:  [97.6 F (36.4 C)-98.3 F (36.8 C)] 97.8 F (36.6 C) (12/31 1142) Pulse Rate:  [66-70] 70 (12/31 1142) Resp:  [13-40] 24 (12/31 1142) BP: (134-162)/(64-84) 140/72 (12/31 1142) SpO2:  [93 %-98 %] 94 % (12/31 1142) Weight:  [54.4 kg] 54.4 kg (12/30 2104)     Height: 5\' 7"  (170.2 cm) Weight: 54.4 kg BMI (Calculated): 18.79   INTAKE/OUTPUT:  This shift: Total I/O In: -  Out: 1300 [Urine:1300]  Last 2 shifts: @IOLAST2SHIFTS @   PHYSICAL EXAM:  Physical Exam Vitals signs and nursing note reviewed.  Constitutional:      General: She is not in acute distress.    Appearance: Normal appearance. She is not ill-appearing.  HENT:     Head: Normocephalic and atraumatic.  Eyes:     Extraocular Movements: Extraocular movements intact.     Conjunctiva/sclera: Conjunctivae normal.  Cardiovascular:     Rate and Rhythm: Normal rate and regular rhythm.     Heart sounds: Normal heart sounds. No murmur. No friction rub. No gallop.   Pulmonary:     Effort: Pulmonary effort is normal. No respiratory distress.     Breath sounds: Normal breath sounds. No wheezing or rhonchi.  Abdominal:     General: Abdomen is flat. There is no distension.     Palpations: Abdomen is soft.     Tenderness: There is no abdominal tenderness. There is no guarding or rebound.     Comments: Previous midline abdominal incision from history of AAA repair.   Genitourinary:    Rectum: External hemorrhoid present. No tenderness.     Comments: 4 skin tags surrounding rectum with prolapsed hemorrhoid present, no active bleeding at this time Skin:    General: Skin is warm and dry.     Coloration: Skin is not pale.  Neurological:     General: No focal deficit present.     Mental Status: She is alert and oriented to person, place, and time.  Psychiatric:        Mood and  Affect: Mood normal.        Behavior: Behavior normal.  Labs:  CBC Latest Ref Rng & Units 01/20/2018 04/06/2017 04/05/2017  WBC 4.0 - 10.5 K/uL 5.8 5.3 6.9  Hemoglobin 12.0 - 15.0 g/dL 12.7 11.9(L) 12.8  Hematocrit 36.0 - 46.0 % 39.7 37.2 38.5  Platelets 150 - 400 K/uL 150 163 209   CMP Latest Ref Rng & Units 01/20/2018 12/03/2017 09/30/2017  Glucose 70 - 99 mg/dL 108(H) - 91  BUN 8 - 23 mg/dL 27(H) 18 24  Creatinine 0.44 - 1.00 mg/dL 1.35(H) 1.36(H) 1.33(H)  Sodium 135 - 145 mmol/L 138 - 141  Potassium 3.5 - 5.1 mmol/L 3.7 - 4.0  Chloride 98 - 111 mmol/L 100 - 100  CO2 22 - 32 mmol/L 30 - 27  Calcium 8.9 - 10.3 mg/dL 9.6 - 9.5  Total Protein 6.5 - 8.1 g/dL 7.0 - -  Total Bilirubin 0.3 - 1.2 mg/dL 1.0 - -  Alkaline Phos 38 - 126 U/L 87 - -  AST 15 - 41 U/L 20 - -  ALT 0 - 44 U/L 14 - -     Assessment/Plan: (ICD-10's: K92.2) 84 y.o. female with lower GI bleeding most likely attributable to a combination of her prolapsed hemorrhoid and dual anti-coagulation therapy (Eliquis + Plavix), complicated by pertinent comorbidities including history of AAA s/p repair, atrial fibrillation on Eliquis, renal artery stenosis s/p stent placement (Schnier - 11/12) and on Plavix, HLD, HTN, COPD, AV Block s/p pacemaker placement, and former tobacco abuse.   - Discussed with Dr Adolphus Birchwood (Gastroenterology) who felt bleeding is from external hemorrhoid and risk of malignant process is low given screening colonoscopy 10 years ago  - Recommend holding anti-coagulation and consulting cardiology   - If she can be off her anti-coagulation for an extended period, she can follow up as an outpatient  - If she is off her anti-coagulation and continues to bleed or can not be off her anti-coagulation long term, will plan for hemorrhoidectomy this admission   - Monitor H&H and transfuse as needed  - Pain control as needed   - Medical management per primary team  All of the above findings and recommendations  were discussed with the patient and her family, and all of patient's and her family's questions were answered to their expressed satisfaction.  Thank you for the opportunity to participate in this patient's care.   -- Edison Simon, PA-C [facility] 01/21/2018, 3:15 PM [phone] M-F: 7am - 4pm

## 2018-01-21 NOTE — ED Notes (Signed)
Family at beside  

## 2018-01-21 NOTE — ED Notes (Signed)
Patient's son has went home. Patient currently resting in bed.

## 2018-01-21 NOTE — Progress Notes (Addendum)
[place] [facility] [place] NAME: Jessica [name]    MR#:  [id number]  DATE OF BIRTH:  1929/11/27  SUBJECTIVE:   Patient seen and evaluated today No new episodes of rectal bleed No vomiting of blood No chest pain  REVIEW OF SYSTEMS:    ROS  CONSTITUTIONAL: No documented fever. No fatigue, weakness. No weight gain, no weight loss.  EYES: No blurry or double vision.  ENT: No tinnitus. No postnasal drip. No redness of the oropharynx.  RESPIRATORY: No cough, no wheeze, no hemoptysis. No dyspnea.  CARDIOVASCULAR: No chest pain. No orthopnea. No palpitations. No syncope.  GASTROINTESTINAL: No nausea, no vomiting or diarrhea. No abdominal pain Had rectal bleed prior to presentation to the emergency room GENITOURINARY: No dysuria or hematuria.  ENDOCRINE: No polyuria or nocturia. No heat or cold intolerance.  HEMATOLOGY: No anemia. No bruising. No bleeding.  INTEGUMENTARY: No rashes. No lesions.  MUSCULOSKELETAL: No arthritis. No swelling. No gout.  NEUROLOGIC: No numbness, tingling, or ataxia. No seizure-type activity.  PSYCHIATRIC: No anxiety. No insomnia. No ADD.   DRUG ALLERGIES:   Allergies  Allergen Reactions  . Codeine Other (See Comments)    Made her feel crazy  . Morphine Other (See Comments)    Made her feel crazy  . Zyrtec [Cetirizine] Other (See Comments)    Causes facial numbness    VITALS:  Blood pressure 140/72, pulse 70, temperature 97.8 F (36.6 C), temperature source Oral, resp. rate (!) 24, height 5\' 7"  (1.702 m), weight 54.4 kg, SpO2 94 %.  PHYSICAL EXAMINATION:   Physical Exam  GENERAL:  84 y.o.-year-old patient lying in the bed with no acute distress.  EYES: Pupils equal, round, reactive to light and accommodation. No scleral icterus. Extraocular muscles intact.  HEENT: Head atraumatic, normocephalic. Oropharynx and nasopharynx clear.  NECK:  Supple, no jugular venous distention. No thyroid enlargement, no  tenderness.  LUNGS: Normal breath sounds bilaterally, no wheezing, rales, rhonchi. No use of accessory muscles of respiration.  CARDIOVASCULAR: S1, S2 normal. No murmurs, rubs, or gallops.  ABDOMEN: Soft, nontender, nondistended. Bowel sounds present. No organomegaly or mass.  Has hemorrhoids EXTREMITIES: No cyanosis, clubbing or edema b/l.    NEUROLOGIC: Cranial nerves II through XII are intact. No focal Motor or sensory deficits b/l.   PSYCHIATRIC: The patient is alert and oriented x 3.  SKIN: No obvious rash, lesion, or ulcer.   LABORATORY PANEL:   CBC Recent Labs  Lab 01/20/18 2108  WBC 5.8  HGB 12.7  HCT 39.7  PLT 150   ------------------------------------------------------------------------------------------------------------------ Chemistries  Recent Labs  Lab 01/20/18 2108  NA 138  K 3.7  CL 100  CO2 30  GLUCOSE 108*  BUN 27*  CREATININE 1.35*  CALCIUM 9.6  AST 20  ALT 14  ALKPHOS 87  BILITOT 1.0   ------------------------------------------------------------------------------------------------------------------  Cardiac Enzymes No results for input(s): TROPONINI in the last 168 hours. ------------------------------------------------------------------------------------------------------------------  RADIOLOGY:  No results found.   ASSESSMENT AND PLAN:   84 year old elderly female patient with history of abdominal aortic aneurysm, pacemaker, gout, hernia, hyperlipidemia, hypertension currently under hospitalist service for rectal bleed  -Acute rectal bleed Has hemorrhoids which could be the source Status post gastroenterology evaluation Surgical consult for possible management of external hemorrhoids Monitor hemoglobin hematocrit Hold blood thinner medications that is Eliquis IV fluid hydration  -Abdominal aortic aneurysm Stable monitoring to continue  -CKD stage III monitor renal function Avoid nephrotoxic drugs   DVT prophylaxis subcu Lovenox  daily  Discussed with surgical consultant Advised to hold blood thinner meds Monitor hemoglobin hematocrit As patient is on blood thinners any procedure to be done after 3 days Patient is able to maintain hemoglobin hematocrit, then can be discharged tomorrow and follow-up with surgery in the clinic Cardiology consultation will be obtained for advice on anticoagulation  All the records are reviewed and case discussed with Care Management/Social Worker. Management plans discussed with the patient, family and they are in agreement.  CODE STATUS: Full code  DVT Prophylaxis: SCDs  TOTAL TIME TAKING CARE OF THIS PATIENT: 38 minutes.   POSSIBLE D/C IN 1 to 2 DAYS, DEPENDING ON CLINICAL CONDITION.  Saundra Shelling M.D on 01/21/2018 at 2:31 PM  Between 7am to 6pm - Pager - [phone]  After 6pm go to [email or web address] - password [email or web address] [place] [facility]  Office  [phone]  CC: Primary care physician; Leone Haven, MD  Note: This dictation was prepared with Dragon dictation along with smaller phrase technology. Any transcriptional errors that result from this process are unintentional.

## 2018-01-21 NOTE — H&P (Signed)
Jessica [name] is an 84 y.o. female.   Chief Complaint: Rectal bleeding HPI: The patient with past medical history of hypertension, pacemaker implantation for complete AV block and AAA status post pacemaker placement presents to the emergency department with bright red bleeding per rectum.  The patient states that she is been having episodes of bright red blood in her stool since last night.  She denies pain, nausea or vomiting.  She has been stable throughout her emergency department evaluation.  Due to lower GI bleed the emergency department staff called the hospitalist service for admission.  Past Medical History:  Diagnosis Date  . AAA (abdominal aortic aneurysm) ([place])   . Abdominal aneurysm without mention of rupture   . Atrioventricular block, complete ([place])   . Cardiac pacemaker in situ   . Gout   . Hernia   . Hyperlipidemia   . Hypertension     Past Surgical History:  Procedure Laterality Date  . ABDOMINAL AORTIC ANEURYSM REPAIR    . HERNIA REPAIR    . ohter     growth on colon surgery  . PPM GENERATOR CHANGEOUT N/A 02/25/2017   Procedure: PPM GENERATOR CHANGEOUT;  Surgeon: Deboraha Sprang, MD;  Location: [facility];  Service: Cardiovascular;  Laterality: N/A;  . RENAL ANGIOGRAPHY Right 12/03/2017   Procedure: RENAL ANGIOGRAPHY;  Surgeon: Katha Cabal, MD;  Location: [facility];  Service: Cardiovascular;  Laterality: Right;  . TONSILLECTOMY      Family History  Problem Relation Age of Onset  . Heart disease Father   . Alcohol abuse Father   . Cancer Brother    Social History:  reports that she quit smoking about 22 years ago. Her smoking use included cigarettes. She has a 75.00 pack-year smoking history. She has never used smokeless tobacco. She reports current alcohol use of about 1.0 standard drinks of alcohol per week. She reports that she does not use drugs.  Allergies:  Allergies  Allergen Reactions  . Codeine Other (See Comments)    Made  her feel crazy  . Morphine Other (See Comments)    Made her feel crazy  . Zyrtec [Cetirizine] Other (See Comments)    Causes facial numbness    Medications Prior to Admission  Medication Sig Dispense Refill  . acetaminophen (TYLENOL) 500 MG tablet Take 500 mg by mouth 2 (two) times daily as needed for moderate pain or headache.     . albuterol (PROAIR HFA) 108 (90 Base) MCG/ACT inhaler Inhale 2 puffs into the lungs every 6 (six) hours as needed for wheezing or shortness of breath. 1 Inhaler 5  . amLODipine (NORVASC) 5 MG tablet Take 1 tablet (5 mg total) by mouth daily. 90 tablet 3  . atenolol (TENORMIN) 25 MG tablet Take 1 tablet (25 mg total) by mouth daily. 90 tablet 3  . cholecalciferol 25 MCG (1000 UT) TABS Take 1,000 Units by mouth daily.    . clopidogrel (PLAVIX) 75 MG tablet Take 1 tablet (75 mg total) by mouth daily. 30 tablet 11  . ELIQUIS 2.5 MG TABS tablet TAKE 1 TABLET BY MOUTH TWICE A DAY 60 tablet 6  . polyethylene glycol (MIRALAX / GLYCOLAX) packet Take 17 g by mouth daily as needed for mild constipation. 14 each 0  . rosuvastatin (CRESTOR) 5 MG tablet Take 1 tablet (5 mg total) by mouth daily. (Patient taking differently: Take 5 mg by mouth every evening. ) 90 tablet 3  . vitamin B-12 (CYANOCOBALAMIN) 1000  MCG tablet Take 1,000 mcg by mouth at bedtime.      Results for orders placed or performed during the hospital encounter of 01/21/18 (from the past 48 hour(s))  Comprehensive metabolic panel     Status: Abnormal   Collection Time: 01/20/18  9:08 PM  Result Value Ref Range   Sodium 138 135 - 145 mmol/L   Potassium 3.7 3.5 - 5.1 mmol/L   Chloride 100 98 - 111 mmol/L   CO2 30 22 - 32 mmol/L   Glucose, Bld 108 (H) 70 - 99 mg/dL   BUN 27 (H) 8 - 23 mg/dL   Creatinine, Ser 1.35 (H) 0.44 - 1.00 mg/dL   Calcium 9.6 8.9 - 10.3 mg/dL   Total Protein 7.0 6.5 - 8.1 g/dL   Albumin 4.2 3.5 - 5.0 g/dL   AST 20 15 - 41 U/L   ALT 14 0 - 44 U/L   Alkaline Phosphatase 87 38 - 126  U/L   Total Bilirubin 1.0 0.3 - 1.2 mg/dL   GFR calc non Af Amer 35 (L) >60 mL/min   GFR calc Af Amer 41 (L) >60 mL/min   Anion gap 8 5 - 15    Comment: Performed at [facility], [address] [place], [place] [address]  CBC     Status: None   Collection Time: 01/20/18  9:08 PM  Result Value Ref Range   WBC 5.8 4.0 - 10.5 K/uL   RBC 4.30 3.87 - 5.11 MIL/uL   Hemoglobin 12.7 12.0 - 15.0 g/dL   HCT 39.7 36.0 - 46.0 %   MCV 92.3 80.0 - 100.0 fL   MCH 29.5 26.0 - 34.0 pg   MCHC 32.0 30.0 - 36.0 g/dL   RDW 13.7 11.5 - 15.5 %   Platelets 150 150 - 400 K/uL   nRBC 0.0 0.0 - 0.2 %    Comment: Performed at [facility], [address] [place], [place] [address]  Type and screen [place]     Status: None   Collection Time: 01/20/18  9:09 PM  Result Value Ref Range   ABO/RH(D) O POS    Antibody Screen NEG    Sample Expiration      01/23/2018 Performed at [facility], [address] [place], [place] [address]   Protime-INR     Status: None   Collection Time: 01/21/18  4:10 AM  Result Value Ref Range   Prothrombin Time 13.5 11.4 - 15.2 seconds   INR 1.04     Comment: Performed at [facility], [address] [place], [place] [address]  APTT     Status: None   Collection Time: 01/21/18  4:10 AM  Result Value Ref Range   aPTT 33 24 - 36 seconds    Comment: Performed at [facility], [address] [place],  [address]  TSH     Status: None   Collection Time: 01/21/18  4:10 AM  Result Value Ref Range   TSH 1.305 0.350 - 4.500 uIU/mL    Comment: Performed by a 3rd Generation assay with a functional sensitivity of <=0.01 uIU/mL. Performed at [facility], [address] [place],  [address]    No results found.  Review of Systems  Constitutional: Negative for chills and fever.  HENT: Negative for sore throat and tinnitus.   Eyes: Negative for blurred vision and redness.   Respiratory: Negative for cough and shortness of breath.   Cardiovascular:  Negative for chest pain, palpitations, orthopnea and PND.  Gastrointestinal: Positive for blood in stool. Negative for abdominal pain, diarrhea, nausea and vomiting.  Genitourinary: Negative for dysuria, frequency and urgency.  Musculoskeletal: Negative for joint pain and myalgias.  Skin: Negative for rash.       No lesions  Neurological: Negative for speech change, focal weakness and weakness.  Endo/Heme/Allergies: Does not bruise/bleed easily.       No temperature intolerance  Psychiatric/Behavioral: Negative for depression and suicidal ideas.    Blood pressure (!) 162/64, pulse 70, temperature 97.6 F (36.4 C), temperature source Oral, resp. rate 18, height 5\' 7"  (1.702 m), weight 54.4 kg, SpO2 98 %. Physical Exam  Vitals reviewed. Constitutional: She is oriented to person, place, and time. She appears well-developed and well-nourished. No distress.  HENT:  Head: Normocephalic and atraumatic.  Mouth/Throat: Oropharynx is clear and moist.  Eyes: Pupils are equal, round, and reactive to light. Conjunctivae and EOM are normal. No scleral icterus.  Neck: Normal range of motion. Neck supple. No JVD present. No tracheal deviation present. No thyromegaly present.  Cardiovascular: Normal rate, regular rhythm and normal heart sounds. Exam reveals no gallop and no friction rub.  No murmur heard. Respiratory: Effort normal and breath sounds normal.  GI: Soft. Bowel sounds are normal. She exhibits no distension. There is no abdominal tenderness.  Genitourinary:    Genitourinary Comments: Deferred   Musculoskeletal: Normal range of motion.        General: No edema.  Lymphadenopathy:    She has no cervical adenopathy.  Neurological: She is alert and oriented to person, place, and time. No cranial nerve deficit. She exhibits normal muscle tone.  Skin: Skin is warm and dry. No rash noted. No erythema.  Psychiatric: She  has a normal mood and affect. Her behavior is normal. Judgment and thought content normal.     Assessment/Plan This is an 83 year old female admitted for GI bleed. 1.  GI bleed: Painless bright red blood per rectum.  Differential diagnosis includes hemorrhoids.  Blood count stable.  Hydrate with intravenous fluid.  Consult gastroenterology.  Hold Eliquis 2.  CKD: Stage III; avoid nephrotoxic agents.  Monitor kidney function 3.  Hypertension: Acceptable for age; resume antihypertensive medication once patient is stable.  Check orthostatics 4.  AAA: Stable; plan as above 5.  DVT prophylaxis: SCDs 6.  GI prophylaxis: None Patient is a full code.  Time spent on admission orders and patient care approximately 45 minutes  Harrie Foreman, MD 01/21/2018, 8:05 AM

## 2018-01-21 NOTE — Progress Notes (Signed)
Advanced care plan.  Purpose of the Encounter: CODE STATUS  Parties in Attendance: Patient and family  Patient's Decision Capacity: Good  Subjective/Patient's story: Presented to the emergency room for rectal bleed   Objective/Medical story Needs evaluation for hemorrhoids, gastroenterology evaluation for endoscopy and colonoscopy to find out the source for rectal bleed   Goals of care determination:  Advance care directives goals of care and treatment plan discussed Patient wants everything done which includes CPR, intubation ventilator if the need arises   CODE STATUS: Full code   Time spent discussing advanced care planning: 16 minutes

## 2018-01-21 NOTE — ED Provider Notes (Signed)
[facility] Emergency Department Provider Note  ____________________________________________   First MD Initiated Contact with Patient 01/21/18 [phone]     (approximate)  I have reviewed the triage vital signs and the nursing notes.   HISTORY  Chief Complaint Rectal Bleeding and Altered Mental Status    HPI Jessica [name] is a 84 y.o. female with medical history as listed below who notably is on both Eliquis and Plavix (she says that Dr. Delana Meyer started on the Plavix about a month ago but she has been on Eliquis for a long time) and who has a pacemaker but says she does not have any artificial valves.  She presents by private vehicle for evaluation of rectal bleeding.  She states that this started yesterday with some bleeding that she noticed when she was urinating although she says that it was coming from her rectum.  She states that then later on she had a bowel movement which seemed to actually stop the bleeding.  She had no more bleeding until this evening and she noticed blood on the pad that she always wears.  Subsequently when she was going to urinate she discovered the blood was "running down my leg".  She is relatively confident that this is rectal bleeding and not vaginal.  She describes it as moderate, spontaneous and acute in onset, and nothing in particular makes it better or worse.  She has been compliant with her medications.  She reports no pain including abdominal pain or cramping.  She denies fever/chills, chest pain, shortness of breath, nausea, vomiting, dysuria, lightheadedness, nor dizziness.  Past Medical History:  Diagnosis Date  . AAA (abdominal aortic aneurysm) ([place])   . Abdominal aneurysm without mention of rupture   . Atrioventricular block, complete ([place])   . Cardiac pacemaker in situ   . Gout   . Hernia   . Hyperlipidemia   . Hypertension     Patient Active Problem List   Diagnosis Date Noted  . Weight loss 08/30/2017  . Low back  pain 08/30/2017  . Depression, major, single episode, mild ([place]) 05/20/2017  . Fall 05/20/2017  . Hypoglycemia 04/04/2017  . Gout 06/05/2016  . AAA (abdominal aortic aneurysm) without rupture ([place]) 04/18/2016  . Loss of sense of smell 12/30/2015  . Memory difficulties 12/30/2015  . Injury of right lower extremity and cellulitis 11/29/2014  . 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 Renal artery stenosis ([place]) 09/15/2014  . 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 CN (constipation) 09/06/2014  . Change in stool caliber 09/06/2014  . Atherosclerosis of aorto-iliac bypass graft ([place]) 08/17/2014  . Shortness of breath 05/12/2014  . Conjunctival hemorrhage of right eye 08/13/2013  . Medicare annual wellness visit, subsequent 04/22/2013  . Allergic rhinitis 11/26/2012  . COPD (chronic obstructive pulmonary disease) ([place]) 09/27/2011  . Hyperlipidemia 02/22/2011  . Long term current use of anticoagulant 04/12/2010  . HYPERTENSION, BENIGN 08/25/2009  . Atrial fibrillation ([place]) 03/07/2009  . Carotid stenosis 08/24/2008  . AV BLOCK, COMPLETE 06/17/2008  . Aneurysm of abdominal vessel ([place]) 06/17/2008  . PACEMAKER-[place] 06/17/2008    Past Surgical History:  Procedure Laterality Date  . ABDOMINAL AORTIC ANEURYSM REPAIR    . HERNIA REPAIR    . ohter     growth on colon surgery  . PPM GENERATOR CHANGEOUT N/A 02/25/2017   Procedure: PPM GENERATOR CHANGEOUT;  Surgeon: Deboraha Sprang, MD;  Location: [facility];  Service: Cardiovascular;  Laterality: N/A;  . RENAL ANGIOGRAPHY  Right 12/03/2017   Procedure: RENAL ANGIOGRAPHY;  Surgeon: Katha Cabal, MD;  Location: [facility];  Service: Cardiovascular;  Laterality: Right;  . TONSILLECTOMY      Prior to Admission medications   Medication Sig Start Date End Date Taking? Authorizing Provider  acetaminophen (TYLENOL) 500 MG tablet Take 500 mg by mouth 2 (two) times daily as needed for moderate pain or headache.      [provider]  albuterol (PROAIR HFA) 108 (90 Base) MCG/ACT inhaler Inhale 2 puffs into the lungs every 6 (six) hours as needed for wheezing or shortness of breath. Patient not taking: Reported on 12/03/2017 01/26/16   Wilhelmina Mcardle, MD  amLODipine (NORVASC) 5 MG tablet Take 1 tablet (5 mg total) by mouth daily. 07/31/17   Minna Merritts, MD  atenolol (TENORMIN) 25 MG tablet Take 1 tablet (25 mg total) by mouth daily. 07/31/17   Minna Merritts, MD  cholecalciferol 25 MCG (1000 UT) TABS Take 1,000 Units by mouth daily.    [provider]  clopidogrel (PLAVIX) 75 MG tablet Take 1 tablet (75 mg total) by mouth daily. 12/04/17   Schnier, [name], MD  ELIQUIS 2.5 MG TABS tablet TAKE 1 TABLET BY MOUTH TWICE A DAY 11/18/17   Deboraha Sprang, MD  polyethylene glycol [facility] / Floria Raveling) packet Take 17 g by mouth daily as needed for mild constipation. 04/06/17   Fritzi Mandes, MD  rosuvastatin (CRESTOR) 5 MG tablet Take 1 tablet (5 mg total) by mouth daily. Patient taking differently: Take 5 mg by mouth every evening.  07/31/17   Minna Merritts, MD  SPIRIVA HANDIHALER 18 MCG inhalation capsule INHALE ONE CAPSULE AS DIRECTED ONCE A DAY Patient not taking: Reported on 11/27/2017 02/14/17   Wilhelmina Mcardle, MD  vitamin B-12 (CYANOCOBALAMIN) 1000 MCG tablet Take 1,000 mcg by mouth at bedtime.    [provider]    Allergies Codeine; Morphine; and Zyrtec [cetirizine]  Family History  Problem Relation Age of Onset  . Heart disease Father   . Alcohol abuse Father   . Cancer Brother     Social History Social History   Tobacco Use  . Smoking status: Former Smoker    Packs/day: 1.50    Years: 50.00    Pack years: 75.00    Types: Cigarettes    Last attempt to quit: 10/03/1995    Years since quitting: 22.3  . Smokeless tobacco: Never Used  Substance Use Topics  . Alcohol use: Yes    Alcohol/week: 1.0 standard drinks    Types: 1 Glasses of wine per week     Comment: per week  . Drug use: No    Review of Systems Constitutional: No fever/chills Eyes: No visual changes. ENT: No sore throat. Cardiovascular: Denies chest pain. Respiratory: Denies shortness of breath. Gastrointestinal: Rectal bleeding as described above.  No abdominal pain.  No nausea, no vomiting.  No diarrhea.  Mild chronic constipation. Genitourinary: Negative for dysuria. Musculoskeletal: Negative for neck pain.  Negative for back pain. Integumentary: Negative for rash. Neurological: Negative for headaches, focal weakness or numbness.   ____________________________________________   PHYSICAL EXAM:  VITAL SIGNS: ED Triage Vitals [01/20/18 2104]  Enc Vitals Group     BP (!) 153/68     Pulse Rate 70     Resp 18     Temp 98.3 F (36.8 C)     Temp Source Oral     SpO2 95 %  Weight 54.4 kg (120 lb)     Height 1.702 m (5\' 7" )     Head Circumference      Peak Flow      Pain Score 0     Pain Loc      Pain Edu?      Excl. in [place]?     Constitutional: Alert and oriented.  Elderly but generally well appearing and in no acute distress. Eyes: Conjunctivae are normal.  Head: Atraumatic. Nose: No congestion/rhinnorhea. Mouth/Throat: Mucous membranes are moist. Neck: No stridor.  No meningeal signs.   Cardiovascular: Normal rate, regular rhythm. Good peripheral circulation. Grossly normal heart sounds. Respiratory: Normal respiratory effort.  No retractions. Lungs CTAB. Gastrointestinal: Soft and nontender. No distention.  Rectal exam is notable for a large amount of coagulated blood in her undergarments.  Upon digital exam the patient has substantial hematochezia with no appreciable stool in the rectal vault but a fair amount of bright red blood.  ED chaperone present throughout exam. Musculoskeletal: No lower extremity tenderness nor edema. No gross deformities of extremities. Neurologic:  Normal speech and language. No gross focal neurologic deficits are  appreciated.  Skin:  Skin is warm, dry and intact. No rash noted. Psychiatric: Mood and affect are normal. Speech and behavior are normal.  ____________________________________________   LABS (all labs ordered are listed, but only abnormal results are displayed)  Labs Reviewed  COMPREHENSIVE METABOLIC PANEL - Abnormal; Notable for the following components:      Result Value   Glucose, Bld 108 (*)    BUN 27 (*)    Creatinine, Ser 1.35 (*)    GFR calc non Af Amer 35 (*)    GFR calc Af Amer 41 (*)    All other components within normal limits  CBC  PROTIME-INR  APTT  POC OCCULT BLOOD, ED  TYPE AND SCREEN   ____________________________________________  EKG  ED ECG REPORT I, Hinda Kehr, the attending physician, personally viewed and interpreted this ECG.  Date: 01/21/2018 EKG Time: 1:24 AM Rate: 70 Rhythm: A. fib/flutter with a ventricular paced rhythm QRS Axis: normal Intervals: A. fib/flutter with a ventricular paced rhythm ST/T Wave abnormalities: Non-specific ST segment / T-wave changes, but no evidence of acute ischemia. Narrative Interpretation: no evidence of acute ischemia   ____________________________________________  RADIOLOGY   ED MD interpretation: No indication for imaging in the emergency department  Official radiology report(s): No results found.  ____________________________________________   PROCEDURES  Critical Care performed: No   Procedure(s) performed:   Procedures   ____________________________________________   INITIAL IMPRESSION / ASSESSMENT AND PLAN / ED COURSE  As part of my medical decision making, I reviewed the following data within the [place] notes reviewed and incorporated, Labs reviewed , EKG interpreted , Old chart reviewed and Discussed with admitting physician     Differential diagnosis includes, but is not limited to, spontaneous bleeding secondary to anticoagulation, AV  malformation, diverticular bleed, neoplasm.  She is hemodynamically stable and in no distress with a normal hemoglobin and otherwise normal labs, but she is having substantial amount of hematochezia which is quite concerning.  I will not reverse her anticoagulation at this time given her medical history and the fact that she is hemodynamically stable but she needs observation in the hospital and further evaluation to identify the source of her bleeding.  The patient agrees with this plan.  I discussed the case with Dr. Jannifer Franklin with the hospitalist service who agrees with the plan.  ____________________________________________  FINAL CLINICAL IMPRESSION(S) / ED DIAGNOSES  Final diagnoses:  Rectal bleeding  Hematochezia  Current use of long term anticoagulation     MEDICATIONS GIVEN DURING THIS VISIT:  Medications - No data to display   ED Discharge Orders    None       Note:  This document was prepared using Dragon voice recognition software and may include unintentional dictation errors.    Hinda Kehr, MD 01/21/18 [phone]

## 2018-01-22 DIAGNOSIS — N183 Chronic kidney disease, stage 3 (moderate): Secondary | ICD-10-CM | POA: Diagnosis not present

## 2018-01-22 DIAGNOSIS — I10 Essential (primary) hypertension: Secondary | ICD-10-CM | POA: Diagnosis not present

## 2018-01-22 DIAGNOSIS — I714 Abdominal aortic aneurysm, without rupture: Secondary | ICD-10-CM | POA: Diagnosis not present

## 2018-01-22 DIAGNOSIS — K625 Hemorrhage of anus and rectum: Secondary | ICD-10-CM | POA: Diagnosis not present

## 2018-01-22 DIAGNOSIS — K922 Gastrointestinal hemorrhage, unspecified: Secondary | ICD-10-CM | POA: Diagnosis not present

## 2018-01-22 LAB — BASIC METABOLIC PANEL
Anion gap: 6 (ref 5–15)
BUN: 21 mg/dL (ref 8–23)
CO2: 26 mmol/L (ref 22–32)
Calcium: 8.7 mg/dL — ABNORMAL LOW (ref 8.9–10.3)
Chloride: 107 mmol/L (ref 98–111)
Creatinine, Ser: 1.14 mg/dL — ABNORMAL HIGH (ref 0.44–1.00)
GFR calc Af Amer: 50 mL/min — ABNORMAL LOW (ref >60)
GFR calc non Af Amer: 44 mL/min — ABNORMAL LOW (ref >60)
Glucose, Bld: 93 mg/dL (ref 70–99)
Potassium: 3.6 mmol/L (ref 3.5–5.1)
Sodium: 139 mmol/L (ref 135–145)

## 2018-01-22 LAB — HEMOGLOBIN AND HEMATOCRIT, BLOOD
HCT: 36.1 % (ref 36.0–46.0)
HCT: 36.6 % (ref 36.0–46.0)
Hemoglobin: 11.7 g/dL — ABNORMAL LOW (ref 12.0–15.0)
Hemoglobin: 11.7 g/dL — ABNORMAL LOW (ref 12.0–15.0)

## 2018-01-22 MED ORDER — POLYETHYLENE GLYCOL 3350 17 G PO PACK: 17.0000 g | PACK | Freq: Every day | ORAL | 0 refills | Status: DC

## 2018-01-22 MED ORDER — DOCUSATE SODIUM 100 MG PO CAPS: 100.0000 mg | ORAL_CAPSULE | Freq: Two times a day (BID) | ORAL | 0 refills | Status: DC

## 2018-01-22 NOTE — Progress Notes (Addendum)
Subjective:  CC:  Jessica [name] is a 85 y.o. female  Hospital stay day 1,   hemorrhoids  HPI: No issues overnight.  No further signs of bleeding   ROS:  General: Denies weight loss, weight gain, fatigue, fevers, chills, and night sweats. Heart: Denies chest pain, palpitations, racing heart, swelling, and decreased activity tolerance. Respiratory: Denies breathing difficulty, shortness of breath, wheezing, cough, and sputum. GI: Denies change in appetite, heartburn, nausea, vomiting, constipation, diarrhea, and blood in stool. GU: Denies difficulty urinating, pain with urinating, urgency, frequency, blood in urine,   Objective:      Temp:  [97.8 F (36.6 C)-98.5 F (36.9 C)] 97.8 F (36.6 C) (01/01 0525) Pulse Rate:  [69-70] 70 (01/01 0525) Resp:  [18-24] 18 (01/01 0525) BP: (117-140)/(57-72) 117/59 (01/01 0525) SpO2:  [90 %-94 %] 90 % (01/01 0525)     Height: 5\' 7"  (170.2 cm) Weight: 54.4 kg BMI (Calculated): 18.79   Intake/Output this shift:   Intake/Output Summary (Last 24 hours) at 01/22/2018 1051 Last data filed at 01/22/2018 0900 Gross per 24 hour  Intake 2760.86 ml  Output 1450 ml  Net 1310.86 ml        Constitutional :  alert, cooperative, appears stated age and no distress  Respiratory:  clear to auscultation bilaterally  Cardiovascular:  irregularly irregular rhythm  Gastrointestinal: soft, non-tender; bowel sounds normal; no masses,  no organomegaly.   Skin: Cool and moist.   Psychiatric: Normal affect, non-agitated, not confused  Rectal: deferred    LABS:  CMP Latest Ref Rng & Units 01/22/2018 01/20/2018 12/03/2017  Glucose 70 - 99 mg/dL 93 108(H) -  BUN 8 - 23 mg/dL 21 27(H) 18  Creatinine 0.44 - 1.00 mg/dL 1.14(H) 1.35(H) 1.36(H)  Sodium 135 - 145 mmol/L 139 138 -  Potassium 3.5 - 5.1 mmol/L 3.6 3.7 -  Chloride 98 - 111 mmol/L 107 100 -  CO2 22 - 32 mmol/L 26 30 -  Calcium 8.9 - 10.3 mg/dL 8.7(L) 9.6 -  Total Protein 6.5 - 8.1 g/dL - 7.0 -  Total  Bilirubin 0.3 - 1.2 mg/dL - 1.0 -  Alkaline Phos 38 - 126 U/L - 87 -  AST 15 - 41 U/L - 20 -  ALT 0 - 44 U/L - 14 -   CBC Latest Ref Rng & Units 01/22/2018 01/22/2018 01/21/2018  WBC 4.0 - 10.5 K/uL - - -  Hemoglobin 12.0 - 15.0 g/dL 11.7(L) 11.7(L) 11.6(L)  Hematocrit 36.0 - 46.0 % 36.6 36.1 35.7(L)  Platelets 150 - 400 K/uL - - -    RADS: n/a Assessment:   Bleeding hemorrhoids. No active bleeding at this time.  Reviewed card note with Dr. Rosana Hoes.  Due to recommendation to continue eliquiis therapy despite this bleeding episode, Plan is to proceed with possible hemorrhoidectomy in couple days once eiliquiis is out of her system.  Plan discussed with patient and son at bedside and they are in agreement

## 2018-01-22 NOTE — Care Management Note (Signed)
Case Management Note  Patient Details  Name: Jessica [name] MRN: [id number] Date of Birth: 1929/04/29   Patient admitted with rectal bleed.  HGB stable. Patient lives at home with son Richardson Landry.  Patient has been to [facility] in 3/19.  No home health services.  Uses a RW for ambulation.  PCP [place].  Son provides transportation.  [place].  Denies any issues obtaining medications.  No anticipated needs for discharge.   Subjective/Objective:                    Action/Plan:   Expected Discharge Date:                  Expected Discharge Plan:     In-House Referral:     Discharge planning Services     Post Acute Care Choice:    Choice offered to:     DME Arranged:    DME Agency:     HH Arranged:    HH Agency:     Status of Service:     If discussed at [employer] of Stay Meetings, dates discussed:    Additional Comments:  Beverly Sessions, RN 01/22/2018, 9:56 AM

## 2018-01-22 NOTE — Progress Notes (Signed)
01/22/2018 3:59 PM  [name] to be D/C'd Home per MD order.  Discussed prescriptions and follow up appointments with the patient. Prescriptions given to patient, medication list explained in detail. Pt verbalized understanding.  Allergies as of 01/22/2018      Reactions   Codeine Other (See Comments)   Made her feel crazy   Morphine Other (See Comments)   Made her feel crazy   Zyrtec [cetirizine] Other (See Comments)   Causes facial numbness      Medication List    STOP taking these medications   clopidogrel 75 MG tablet Commonly known as:  PLAVIX   ELIQUIS 2.5 MG Tabs tablet Generic drug:  apixaban     TAKE these medications   acetaminophen 500 MG tablet Commonly known as:  TYLENOL Take 500 mg by mouth 2 (two) times daily as needed for moderate pain or headache.   albuterol 108 (90 Base) MCG/ACT inhaler Commonly known as:  PROAIR HFA Inhale 2 puffs into the lungs every 6 (six) hours as needed for wheezing or shortness of breath.   amLODipine 5 MG tablet Commonly known as:  NORVASC Take 1 tablet (5 mg total) by mouth daily.   atenolol 25 MG tablet Commonly known as:  TENORMIN Take 1 tablet (25 mg total) by mouth daily.   cholecalciferol 25 MCG (1000 UT) tablet Commonly known as:  VITAMIN D3 Take 1,000 Units by mouth daily.   docusate sodium 100 MG capsule Commonly known as:  COLACE Take 1 capsule (100 mg total) by mouth 2 (two) times daily.   polyethylene glycol packet Commonly known as:  MIRALAX / GLYCOLAX Take 17 g by mouth daily. What changed:    when to take this  reasons to take this   rosuvastatin 5 MG tablet Commonly known as:  CRESTOR Take 1 tablet (5 mg total) by mouth daily. What changed:  when to take this   vitamin B-12 1000 MCG tablet Commonly known as:  CYANOCOBALAMIN Take 1,000 mcg by mouth at bedtime.       Vitals:   01/21/18 2109 01/22/18 0525  BP: (!) 132/57 (!) 117/59  Pulse: 69 70  Resp: 20 18  Temp: 98.5 F (36.9 C)  97.8 F (36.6 C)  SpO2: 92% 90%    Skin clean, dry and intact without evidence of skin break down, no evidence of skin tears noted. IV catheter discontinued intact. Site without signs and symptoms of complications. Dressing and pressure applied. Pt denies pain at this time. No complaints noted.  An After Visit Summary was printed and given to the patient. Patient escorted via [place], and D/C home via private auto.  Fuller Mandril, RN

## 2018-01-22 NOTE — Discharge Summary (Signed)
[facility] - [place] at [facility], 85 y.o., DOB [birth year], MRN [id number]. Admission date: 01/21/2018 Discharge Date 01/22/2018 Primary MD Leone Haven, MD Admitting Physician Harrie Foreman, MD  Admission Diagnosis  Hematochezia [K92.1] Rectal bleeding [K62.5] Current use of long term anticoagulation [Z79.01]  Discharge Diagnosis   Active Problems: Lower GI bleed due to rectal bleeding Abdominal aortic aneurysm Chronic kidney disease stage III Hypertension Hyperlipidemia Gout Status post pacemaker placement  Hospital Course  The patient with past medical history of hypertension, pacemaker implantation for complete AV block and AAA status post pacemaker placement presents to the emergency department with bright red bleeding per rectum.  The patient states that she is been having episodes of bright red blood in her stool for 1 day.  Patient was evaluated and was seen gastroenterology who felt the patient's bleeding was related to external hemorrhoids.  Therefore surgery consult was recommended.  She was seen in consultation by Dr. Rosana Hoes who was stated that patient can have this procedure outpatient.  Patient is on Eliquis for atrial fibrillation.  This was held and this will need to be continued to be held.  Patient can follow-up with surgery outpatient for procedure for external hemorrhoids.  If there is no plan for surgery then her Eliquis can be resumed.            Consults  GI,surgery  Significant Tests:  See full reports for all details    No results found.     Today   Subjective:   Jessica [name] patient not having any further bleeding Objective:   Blood pressure (!) 117/59, pulse 70, temperature 97.8 F (36.6 C), temperature source Oral, resp. rate 18, height 5\' 7"  (1.702 m), weight 54.4 kg, SpO2 90 %.  .  Intake/Output Summary (Last 24 hours) at 01/22/2018 1452 Last data filed at 01/22/2018 0900 Gross per 24 hour  Intake  2760.86 ml  Output 1050 ml  Net 1710.86 ml    Exam VITAL SIGNS: Blood pressure (!) 117/59, pulse 70, temperature 97.8 F (36.6 C), temperature source Oral, resp. rate 18, height 5\' 7"  (1.702 m), weight 54.4 kg, SpO2 90 %.  GENERAL:  85 y.o.-year-old patient lying in the bed with no acute distress.  EYES: Pupils equal, round, reactive to light and accommodation. No scleral icterus. Extraocular muscles intact.  HEENT: Head atraumatic, normocephalic. Oropharynx and nasopharynx clear.  NECK:  Supple, no jugular venous distention. No thyroid enlargement, no tenderness.  LUNGS: Normal breath sounds bilaterally, no wheezing, rales,rhonchi or crepitation. No use of accessory muscles of respiration.  CARDIOVASCULAR: S1, S2 normal. No murmurs, rubs, or gallops.  ABDOMEN: Soft, nontender, nondistended. Bowel sounds present. No organomegaly or mass.  EXTREMITIES: No pedal edema, cyanosis, or clubbing.  NEUROLOGIC: Cranial nerves II through XII are intact. Muscle strength 5/5 in all extremities. Sensation intact. Gait not checked.  PSYCHIATRIC: The patient is alert and oriented x 3.  SKIN: No obvious rash, lesion, or ulcer.   Data Review     CBC w Diff:  Lab Results  Component Value Date   WBC 5.8 01/20/2018   HGB 11.7 (L) 01/22/2018   HGB 14.4 01/24/2017   HGB 13.0 10/05/2008   HCT 36.6 01/22/2018   HCT 42.4 01/24/2017   HCT 37.3 10/05/2008   PLT 150 01/20/2018   PLT 148 (L) 01/24/2017   LYMPHOPCT 11 04/04/2017   LYMPHOPCT 27.8 10/05/2008   MONOPCT 9 04/04/2017   MONOPCT 8.0 10/05/2008   EOSPCT  1 04/04/2017   EOSPCT 1.4 10/05/2008   BASOPCT 1 04/04/2017   BASOPCT 1.0 10/05/2008   CMP:  Lab Results  Component Value Date   NA 139 01/22/2018   NA 141 09/30/2017   NA 141 12/15/2013   K 3.6 01/22/2018   K 3.1 (L) 12/15/2013   CL 107 01/22/2018   CL 103 12/15/2013   CO2 26 01/22/2018   CO2 28 12/15/2013   BUN 21 01/22/2018   BUN 24 09/30/2017   BUN 26 (H) 12/15/2013    CREATININE 1.14 (H) 01/22/2018   CREATININE 1.40 (H) 12/15/2013   PROT 7.0 01/20/2018   PROT 6.8 01/03/2012   ALBUMIN 4.2 01/20/2018   ALBUMIN 4.4 01/03/2012   BILITOT 1.0 01/20/2018   ALKPHOS 87 01/20/2018   AST 20 01/20/2018   ALT 14 01/20/2018  .  Micro Results No results found for this or any previous visit (from the past 240 hour(s)).      Code Status Orders  (From admission, onward)         Start     Ordered   01/21/18 0323  Full code  Continuous     01/21/18 0323        Code Status History    Date Active Date Inactive Code Status Order ID Comments User Context   12/03/2017 1007 12/03/2017 1517 Full Code [id number]  Katha Cabal, MD Inpatient   04/04/2017 1327 04/06/2017 1439 DNR [id number]  Hillary Bow, MD ED   02/25/2017 1605 02/25/2017 2026 Full Code [id number]  Deboraha Sprang, MD Inpatient    Advance Directive Documentation     Most Recent Value  Type of Advance Directive  Healthcare Power of Attorney, Living will  Pre-existing out of facility DNR order (yellow form or pink MOST form)  -  "MOST" Form in Place?  -          Follow-up Information    Leone Haven, MD Follow up in 6 day(s).   Specialty:  Family Medicine Contact information: [address] [place] [address] [phone]        Vickie Epley, MD Follow up in 1 week(s).   Specialty:  General Surgery Why:  hemmorids needs hemmoridecomy Contact information: [address] [place] [address] [phone]           Discharge Medications   Allergies as of 01/22/2018      Reactions   Codeine Other (See Comments)   Made her feel crazy   Morphine Other (See Comments)   Made her feel crazy   Zyrtec [cetirizine] Other (See Comments)   Causes facial numbness      Medication List    STOP taking these medications   clopidogrel 75 MG tablet Commonly known as:  PLAVIX   ELIQUIS 2.5 MG Tabs tablet Generic drug:  apixaban      TAKE these medications   acetaminophen 500 MG tablet Commonly known as:  TYLENOL Take 500 mg by mouth 2 (two) times daily as needed for moderate pain or headache.   albuterol 108 (90 Base) MCG/ACT inhaler Commonly known as:  PROAIR HFA Inhale 2 puffs into the lungs every 6 (six) hours as needed for wheezing or shortness of breath.   amLODipine 5 MG tablet Commonly known as:  NORVASC Take 1 tablet (5 mg total) by mouth daily.   atenolol 25 MG tablet Commonly known as:  TENORMIN Take 1 tablet (25 mg total) by mouth daily.   cholecalciferol  25 MCG (1000 UT) tablet Commonly known as:  VITAMIN D3 Take 1,000 Units by mouth daily.   docusate sodium 100 MG capsule Commonly known as:  COLACE Take 1 capsule (100 mg total) by mouth 2 (two) times daily.   polyethylene glycol packet Commonly known as:  MIRALAX / GLYCOLAX Take 17 g by mouth daily. What changed:    when to take this  reasons to take this   rosuvastatin 5 MG tablet Commonly known as:  CRESTOR Take 1 tablet (5 mg total) by mouth daily. What changed:  when to take this   vitamin B-12 1000 MCG tablet Commonly known as:  CYANOCOBALAMIN Take 1,000 mcg by mouth at bedtime.          Total Time in preparing paper work, data evaluation and todays exam - 12 minutes  [name] M.D on 01/22/2018 at 2:52 [place]  [phone]

## 2018-01-22 NOTE — Care Management Obs Status (Signed)
[place] NOTIFICATION   Patient Details  Name: Jessica [name] MRN: [id number] Date of Birth: 03/10/1929   Medicare Observation Status Notification Given:  Yes    Beverly Sessions, RN 01/22/2018, 9:55 AM

## 2018-01-23 ENCOUNTER — Telehealth: Payer: Self-pay | Admitting: Gastroenterology

## 2018-01-23 ENCOUNTER — Telehealth: Payer: Self-pay

## 2018-01-23 NOTE — Telephone Encounter (Signed)
Unable to reach patient for transitional care management.  Not able to leave a message; no voice mail set up.  Recommended hospital follow up with primary care provider prior to hemorrhoid surgery. Discharged 01/22/18.  Tentative appointment 01/27/18 at 8:30.  Follow as appropriate.

## 2018-01-23 NOTE — Telephone Encounter (Signed)
-----   Message from Virgel Manifold, MD sent at 01/22/2018  6:56 PM EST -----  Please set up clinic appointment with me in 2-4 weeks

## 2018-01-23 NOTE — Telephone Encounter (Signed)
Attempted to call pt to schedule 2-4 week f/u per Dr. Darene Lamer unable to leave msg

## 2018-01-28 ENCOUNTER — Ambulatory Visit: Payer: Medicare Other | Admitting: Urology

## 2018-01-28 ENCOUNTER — Other Ambulatory Visit: Payer: Self-pay

## 2018-01-28 ENCOUNTER — Telehealth: Payer: Self-pay | Admitting: Cardiovascular Disease

## 2018-01-28 ENCOUNTER — Ambulatory Visit: Payer: Medicare Other | Admitting: Surgery

## 2018-01-28 ENCOUNTER — Encounter: Payer: Self-pay | Admitting: Surgery

## 2018-01-28 VITALS — BP 160/92 | HR 70 | Temp 96.2°F | Ht 65.0 in | Wt 121.0 lb

## 2018-01-28 DIAGNOSIS — K648 Other hemorrhoids: Secondary | ICD-10-CM | POA: Diagnosis not present

## 2018-01-28 DIAGNOSIS — K641 Second degree hemorrhoids: Secondary | ICD-10-CM | POA: Diagnosis not present

## 2018-01-28 NOTE — Telephone Encounter (Signed)
Difficult case I talked with Dr. Rosana Hoes  if she starts to rebleed may have to hold the Eliquis Hopefully can get 30 days with Plavix given recent renal stent

## 2018-01-28 NOTE — Telephone Encounter (Signed)
Routing to Dr Rockey Situ for his review and advice.

## 2018-01-28 NOTE — Patient Instructions (Signed)
Patient is to resume taking her Plavix and Eliquis. Continue bowel regiment call the office if you have any hemorrhoid bleeding and following up with the office in 1 month.   Call with any questions or concerns.

## 2018-01-28 NOTE — Progress Notes (Signed)
Surgical Clinic Progress/Follow-up Note   HPI:  85 y.o. Female presents to clinic for follow-up evaluation of previously bleeding internal hemorrhoid. Patient was recently admitted to [facility], where her chronic therapeutic Eliquis anticoagulation for atrial fibrillation and Plavix for recent renal artery stent placement were both held per admitting hospitalist. Patient was also started on a bowel regimen for chronic constipation. Cardiologist advised to resume Eliquis as soon as able, and inpatient hemorrhoidectomy was discussed. However, patient was discharged with instructions to continue holding both her Eliquis and Plavix before hemorrhoidectomy could be performed. Patient and her son today report no further anorectal bleeding and softer regular BM's with prescribed bowel regimen. She otherwise denies any abdominal pain, urinary difficulty, fever/chills, N/V, CP, palpitations, or SOB.  Review of Systems:  Constitutional: denies any other weight loss, fever, chills, or sweats  Eyes: denies any other vision changes, history of eye injury  ENT: denies sore throat, hearing problems  Respiratory: denies shortness of breath, wheezing  Cardiovascular: denies chest pain, palpitations  Gastrointestinal: abdominal pain, N/V, and bowel function as per HPI Musculoskeletal: denies any other joint pains or cramps  Skin: Denies any other rashes or skin discolorations  Neurological: denies any other headache, dizziness, weakness  Psychiatric: denies any other depression, anxiety  All other review of systems: otherwise negative   Vital Signs:  BP (!) 160/92   Pulse 70   Temp (!) 96.2 F (35.7 C) (Temporal)   Ht 5\' 5"  (1.651 m)   Wt 121 lb (54.9 kg)   SpO2 95%   BMI 20.14 kg/m    Physical Exam:  Constitutional:  -- Normal body habitus  -- Awake, alert, and oriented x3  Eyes:  -- Pupils equally round and reactive to light  -- No scleral icterus  Ear, nose, throat:  -- No jugular venous  distension  -- No nasal drainage, bleeding Pulmonary:  -- No crackles -- Equal breath sounds bilaterally -- Breathing non-labored at rest Cardiovascular:  -- S1, S2 present  -- No pericardial rubs  Gastrointestinal:  -- Soft, nontender, non-distended, no guarding/rebound  -- No abdominal masses appreciated, pulsatile or otherwise  Anorectal: -- Single red-appearing, but non-tender, easily reducible (grade 2) non-bleeding internal hemorrhoid and multiple non-inflamed, non-bleeding, non-tender external hemorrhoids/skin tags, no gross blood and anal sphincter tone essentially WNL -- No anal fissure(s), surrounding erythema, or fluctuance Musculoskeletal / Integumentary:  -- Wounds or skin discoloration: None appreciated  -- Extremities: B/L UE and LE FROM, hands and feet warm, no edema  Neurologic:  -- Motor function: intact and symmetric  -- Sensation: intact and symmetric   Laboratory studies:  CBC Latest Ref Rng & Units 01/22/2018 01/22/2018 01/21/2018  WBC 4.0 - 10.5 K/uL - - -  Hemoglobin 12.0 - 15.0 g/dL 11.7(L) 11.7(L) 11.6(L)  Hematocrit 36.0 - 46.0 % 36.6 36.1 35.7(L)  Platelets 150 - 400 K/uL - - -   CMP Latest Ref Rng & Units 01/22/2018 01/20/2018 12/03/2017  Glucose 70 - 99 mg/dL 93 108(H) -  BUN 8 - 23 mg/dL 21 27(H) 18  Creatinine 0.44 - 1.00 mg/dL 1.14(H) 1.35(H) 1.36(H)  Sodium 135 - 145 mmol/L 139 138 -  Potassium 3.5 - 5.1 mmol/L 3.6 3.7 -  Chloride 98 - 111 mmol/L 107 100 -  CO2 22 - 32 mmol/L 26 30 -  Calcium 8.9 - 10.3 mg/dL 8.7(L) 9.6 -  Total Protein 6.5 - 8.1 g/dL - 7.0 -  Total Bilirubin 0.3 - 1.2 mg/dL - 1.0 -  Alkaline Phos 38 -  126 U/L - 87 -  AST 15 - 41 U/L - 20 -  ALT 0 - 44 U/L - 14 -   Imaging: No new pertinent imaging studies available for review at this time   Assessment:  85 y.o. yo Female with a problem list including...  Patient Active Problem List   Diagnosis Date Noted  . GI bleed 01/21/2018  . Rectal bleeding   . Weight loss  08/30/2017  . Low back pain 08/30/2017  . Depression, major, single episode, mild ([place]) 05/20/2017  . Fall 05/20/2017  . Hypoglycemia 04/04/2017  . Gout 06/05/2016  . AAA (abdominal aortic aneurysm) without rupture ([place]) 04/18/2016  . Loss of sense of smell 12/30/2015  . Memory difficulties 12/30/2015  . Injury of right lower extremity and cellulitis 11/29/2014  . Renal artery aneurysm ([place]) 10/26/2014  . Renal artery stenosis ([place]) 09/15/2014  . Renal arterial aneurysm ([place]) 09/15/2014  . CN (constipation) 09/06/2014  . Change in stool caliber 09/06/2014  . Atherosclerosis of aorto-iliac bypass graft ([place]) 08/17/2014  . Shortness of breath 05/12/2014  . Conjunctival hemorrhage of right eye 08/13/2013  . Medicare annual wellness visit, subsequent 04/22/2013  . Allergic rhinitis 11/26/2012  . COPD (chronic obstructive pulmonary disease) ([place]) 09/27/2011  . Hyperlipidemia 02/22/2011  . Long term current use of anticoagulant 04/12/2010  . HYPERTENSION, BENIGN 08/25/2009  . Atrial fibrillation ([place]) 03/07/2009  . Carotid stenosis 08/24/2008  . AV BLOCK, COMPLETE 06/17/2008  . Aneurysm of abdominal vessel ([place]) 06/17/2008  . PACEMAKER-[place] 06/17/2008    presents to clinic for follow-up evaluation of otherwise asymptomatic hemorrhoids with bleeding while taking both Eliquis anticoagulation for chronic atrial fibrillation and Plavix antiplatelet medication for recent renal artery stent placement, doing well with no further bleeding since both meds held by hospitalist following recent admission to [facility].  Plan:   - discussed coordination of anticoagulants/antiplatelet meds  - continue bowel regimen including daily fiber supplement and BID Colace stool softener, maintain adequate hydration  - discussed scheduling surgery vs resume Eliquis and Plavix with follow-up, and patient and her son both express preference to resume meds today and follow-up to reassess anorectal bleeding / need for  hemorrhoidectomy  - return to clinic in 1 month if no further anorectal bleeding, instructed to call office sooner if any bleeding, questions, or concerns  - meanwhile, office will reach out to cardiology and vascular surgeon for guidance regarding anticoagulation (Eliquis) and anti-platelet (Plavix) medications if bleeding continues and hemorrhoidectomy is required  - anticipate may need to continue Plavix and likely hold Eliquis until after surgery, but vascular and cardiology opinions appreciated  All of the above recommendations were discussed with the patient and patient's son, and all of patient's and family's questions were answered to their expressed satisfaction.  -- Marilynne Drivers Rosana Hoes, MD, [place]: [facility] - Partnering for exceptional care. Office: [phone]

## 2018-01-28 NOTE — Telephone Encounter (Signed)
Per Dr. Tama High office [facility])  Recent hospital admission for GI bleed  dc anticoags while there   Will be restarting per Dr. Rosana Hoes Today and would like advice for patient about what to do upon recurring bleed.    Please call Dr. Rosana Hoes to discuss: [phone]   Cell given to [facility] RN

## 2018-01-29 NOTE — Telephone Encounter (Signed)
Patient notified and verbalized understanding of recommendations. She is aware of plan from appointment with Dr Rosana Hoes yesterday. She will call physician if bleeding reoccurs.

## 2018-01-30 ENCOUNTER — Ambulatory Visit (INDEPENDENT_AMBULATORY_CARE_PROVIDER_SITE_OTHER): Payer: Medicare Other | Admitting: Vascular Surgery

## 2018-01-30 ENCOUNTER — Encounter (INDEPENDENT_AMBULATORY_CARE_PROVIDER_SITE_OTHER): Payer: Self-pay | Admitting: Vascular Surgery

## 2018-01-30 VITALS — BP 148/80 | HR 70 | Resp 16 | Ht 65.0 in | Wt 120.4 lb

## 2018-01-30 DIAGNOSIS — Z87891 Personal history of nicotine dependence: Secondary | ICD-10-CM

## 2018-01-30 DIAGNOSIS — I701 Atherosclerosis of renal artery: Secondary | ICD-10-CM

## 2018-01-30 DIAGNOSIS — I4821 Permanent atrial fibrillation: Secondary | ICD-10-CM | POA: Diagnosis not present

## 2018-01-30 DIAGNOSIS — I6523 Occlusion and stenosis of bilateral carotid arteries: Secondary | ICD-10-CM

## 2018-01-30 DIAGNOSIS — I10 Essential (primary) hypertension: Secondary | ICD-10-CM | POA: Diagnosis not present

## 2018-01-30 DIAGNOSIS — J438 Other emphysema: Secondary | ICD-10-CM | POA: Diagnosis not present

## 2018-01-30 LAB — CUP PACEART REMOTE DEVICE CHECK
Battery Remaining Longevity: 100 mo
Battery Remaining Percentage: 95.5 %
Battery Voltage: 2.99 V
Brady Statistic AP VP Percent: 71 %
Brady Statistic AP VS Percent: 1 %
Brady Statistic AS VP Percent: 29 %
Brady Statistic AS VS Percent: 0 %
Brady Statistic RA Percent Paced: 1 %
Brady Statistic RV Percent Paced: 99 %
Date Time Interrogation Session: 20191107070015
Implantable Lead Implant Date: 20090610
Implantable Lead Implant Date: 20090610
Implantable Lead Location: 753859
Implantable Lead Location: 753860
Implantable Lead Model: 5076
Implantable Pulse Generator Implant Date: 20190204
Lead Channel Impedance Value: 360 Ohm
Lead Channel Impedance Value: 440 Ohm
Lead Channel Pacing Threshold Amplitude: 0.75 V
Lead Channel Pacing Threshold Pulse Width: 0.5 ms
Lead Channel Sensing Intrinsic Amplitude: 1.4 mV
Lead Channel Sensing Intrinsic Amplitude: 12 mV
Lead Channel Setting Pacing Amplitude: 2 V
Lead Channel Setting Pacing Amplitude: 2.5 V
Lead Channel Setting Pacing Pulse Width: 0.5 ms
Lead Channel Setting Sensing Sensitivity: 2 mV
Pulse Gen Model: 2272
Pulse Gen Serial Number: 8991929

## 2018-01-30 NOTE — Progress Notes (Signed)
MRN : [id number]  Jessica [name] is a 85 y.o. (04/06/1929) female who presents with chief complaint of  Chief Complaint  Patient presents with  . Follow-up  .  History of Present Illness:   The patient returns to the office for followup of renal vascular hypertension and renal artery stenosis. There have been no interval changes in the patient's blood pressure control.   The patient denies headache or flushing.  No flank or unusual back pain.    Recently she was admitted to the hospital for rectal bleeding.  She has a-fib and is on Eliquis.  This was stopped for a period and she has been restarted.  She has not been having any further bleeding.  There have been no significant changes to the patient's overall health care.  No interval shortening of the patient's walking distance or new symptoms consistent with claudication.  The patient denies the  development of rest pain symptoms. No new ulcers or wounds have occurred since the last visit.  The patient denies amaurosis fugax or recent TIA symptoms. There are no recent neurological changes noted. The patient denies history of DVT, PE or superficial thrombophlebitis. The patient denies recent episodes of angina or shortness of breath.      Current Meds  Medication Sig  . acetaminophen (TYLENOL) 500 MG tablet Take 500 mg by mouth 2 (two) times daily as needed for moderate pain or headache.   . albuterol (PROAIR HFA) 108 (90 Base) MCG/ACT inhaler Inhale 2 puffs into the lungs every 6 (six) hours as needed for wheezing or shortness of breath.  Marland Kitchen amLODipine (NORVASC) 5 MG tablet Take 1 tablet (5 mg total) by mouth daily.  Marland Kitchen apixaban (ELIQUIS) 2.5 MG TABS tablet Take 2.5 mg by mouth 2 (two) times daily.  Marland Kitchen atenolol (TENORMIN) 25 MG tablet Take 1 tablet (25 mg total) by mouth daily.  . cholecalciferol 25 MCG (1000 UT) TABS Take 1,000 Units by mouth daily.  . clopidogrel (PLAVIX) 75 MG tablet Take 75 mg by mouth daily.  Marland Kitchen docusate  sodium (COLACE) 100 MG capsule Take 1 capsule (100 mg total) by mouth 2 (two) times daily.  . polyethylene glycol (MIRALAX / GLYCOLAX) packet Take 17 g by mouth daily.  . rosuvastatin (CRESTOR) 5 MG tablet Take 1 tablet (5 mg total) by mouth daily. (Patient taking differently: Take 5 mg by mouth every evening. )  . vitamin B-12 (CYANOCOBALAMIN) 1000 MCG tablet Take 1,000 mcg by mouth at bedtime.    Past Medical History:  Diagnosis Date  . AAA (abdominal aortic aneurysm) ([place])   . Abdominal aneurysm without mention of rupture   . Atrioventricular block, complete ([place])   . Cardiac pacemaker in situ   . Gout   . Hernia   . Hyperlipidemia   . Hypertension     Past Surgical History:  Procedure Laterality Date  . ABDOMINAL AORTIC ANEURYSM REPAIR    . HERNIA REPAIR    . ohter     growth on colon surgery  . PPM GENERATOR CHANGEOUT N/A 02/25/2017   Procedure: PPM GENERATOR CHANGEOUT;  Surgeon: Deboraha Sprang, MD;  Location: [facility];  Service: Cardiovascular;  Laterality: N/A;  . RENAL ANGIOGRAPHY Right 12/03/2017   Procedure: RENAL ANGIOGRAPHY;  Surgeon: Katha Cabal, MD;  Location: [facility];  Service: Cardiovascular;  Laterality: Right;  . TONSILLECTOMY      Social History Social History   Tobacco Use  . Smoking status: Former  Smoker    Packs/day: 1.50    Years: 50.00    Pack years: 75.00    Types: Cigarettes    Last attempt to quit: 10/03/1995    Years since quitting: 22.3  . Smokeless tobacco: Never Used  Substance Use Topics  . Alcohol use: Yes    Alcohol/week: 1.0 standard drinks    Types: 1 Glasses of wine per week    Comment: per week  . Drug use: No    Family History Family History  Problem Relation Age of Onset  . Heart disease Father   . Alcohol abuse Father   . Cancer Brother     Allergies  Allergen Reactions  . Codeine Other (See Comments)    Made her feel crazy  . Morphine Other (See Comments)    Made her feel crazy  .  Zyrtec [Cetirizine] Other (See Comments)    Causes facial numbness     REVIEW OF SYSTEMS (Negative unless checked)  Constitutional: [] Weight loss  [] Fever  [] Chills Cardiac: [] Chest pain   [] Chest pressure   [] Palpitations   [] Shortness of breath when laying flat   [] Shortness of breath with exertion. Vascular:  [] Pain in legs with walking   [] Pain in legs at rest  [] History of DVT   [] Phlebitis   [] Swelling in legs   [] Varicose veins   [] Non-healing ulcers Pulmonary:   [] Uses home oxygen   [] Productive cough   [] Hemoptysis   [] Wheeze  [] COPD   [] Asthma Neurologic:  [] Dizziness   [] Seizures   [] History of stroke   [] History of TIA  [] Aphasia   [] Vissual changes   [] Weakness or numbness in arm   [] Weakness or numbness in leg Musculoskeletal:   [] Joint swelling   [] Joint pain   [] Low back pain Hematologic:  [] Easy bruising  [] Easy bleeding   [] Hypercoagulable state   [] Anemic Gastrointestinal:  [] Diarrhea   [] Vomiting  [] Gastroesophageal reflux/heartburn   [] Difficulty swallowing. Genitourinary:  [] Chronic kidney disease   [] Difficult urination  [] Frequent urination   [] Blood in urine Skin:  [] Rashes   [] Ulcers  Psychological:  [] History of anxiety   []  History of major depression.  Physical Examination  Vitals:   01/30/18 1138  BP: (!) 148/80  Pulse: 70  Resp: 16  Weight: 120 lb 6.4 oz (54.6 kg)  Height: 5\' 5"  (1.651 m)   Body mass index is 20.04 kg/m. Gen: WD/WN, NAD Head: [place]/AT, No temporalis wasting.  Ear/Nose/Throat: Hearing grossly intact, nares w/o erythema or drainage Eyes: PER, EOMI, sclera nonicteric.  Neck: Supple, no large masses.   Pulmonary:  Good air movement, no audible wheezing bilaterally, no use of accessory muscles.  Cardiac: RRR, no JVD Vascular:  Vessel Right Left  Radial Palpable Palpable  Gastrointestinal: Non-distended. No guarding/no peritoneal signs.  Musculoskeletal: M/S 5/5 throughout.  No deformity or atrophy.  Neurologic: CN 2-12 intact.  Symmetrical.  Speech is fluent. Motor exam as listed above. Psychiatric: Judgment intact, Mood & affect appropriate for pt's clinical situation. Dermatologic: No rashes or ulcers noted.  No changes consistent with cellulitis. Lymph : No lichenification or skin changes of chronic lymphedema.  CBC Lab Results  Component Value Date   WBC 5.8 01/20/2018   HGB 11.7 (L) 01/22/2018   HCT 36.6 01/22/2018   MCV 92.3 01/20/2018   PLT 150 01/20/2018    BMET    Component Value Date/Time   NA 139 01/22/2018 0431   NA 141 09/30/2017 0212   NA 141 12/15/2013 1721   K 3.6  01/22/2018 0431   K 3.1 (L) 12/15/2013 1721   CL 107 01/22/2018 0431   CL 103 12/15/2013 1721   CO2 26 01/22/2018 0431   CO2 28 12/15/2013 1721   GLUCOSE 93 01/22/2018 0431   GLUCOSE 115 (H) 12/15/2013 1721   BUN 21 01/22/2018 0431   BUN 24 09/30/2017 0212   BUN 26 (H) 12/15/2013 1721   CREATININE 1.14 (H) 01/22/2018 0431   CREATININE 1.40 (H) 12/15/2013 1721   CALCIUM 8.7 (L) 01/22/2018 0431   CALCIUM 9.3 12/15/2013 1721   GFRNONAA 44 (L) 01/22/2018 0431   GFRNONAA 38 (L) 12/15/2013 1721   GFRAA 50 (L) 01/22/2018 0431   GFRAA 46 (L) 12/15/2013 1721   Estimated Creatinine Clearance: 30.5 mL/min (A) (by C-G formula based on SCr of 1.14 mg/dL (H)).  COAG Lab Results  Component Value Date   INR 1.04 01/21/2018   INR 0.99 04/04/2017   INR 1.3 02/27/2017    Radiology No results found.   Assessment/Plan 1. Renal artery stenosis (HCC) BP today was acceptable Given patient's atherosclerosis and PAD optimal control of the patient's hypertension is important.  The patient's BP and noninvasive studies support the previous intervention is patent. No further intervention is indicated at this time.  Therefore the patient  will continue the current medications, no changes at this time.  Given that she has stopped rectal bleeding and is back on her anticoagulants no further changes.  Use of fiber to keep her bowel  movements acceptable.  The primary medical service will continue aggressive antihypertensive therapy as per the AHA guidelines    2. Bilateral carotid artery stenosis Recommend:  Given the patient's asymptomatic subcritical stenosis no further invasive testing or surgery at this time.  Continue antiplatelet therapy as prescribed Continue management of CAD, HTN and Hyperlipidemia Healthy heart diet,  encouraged exercise at least 4 times per week Follow up as arranged  3. HYPERTENSION, BENIGN Continue antihypertensive medications as already ordered, these medications have been reviewed and there are no changes at this time.   4. Permanent atrial fibrillation Continue antiarrhythmia medications as already ordered, these medications have been reviewed and there are no changes at this time.  Continue anticoagulation as ordered by Cardiology Service   5. Other emphysema ([place]) Continue pulmonary medications and aerosols as already ordered, these medications have been reviewed and there are no changes at this time.      Hortencia Pilar, MD  01/30/2018 12:25 PM

## 2018-02-20 ENCOUNTER — Encounter: Payer: Self-pay | Admitting: Gastroenterology

## 2018-02-20 ENCOUNTER — Ambulatory Visit: Payer: Medicare Other | Admitting: Gastroenterology

## 2018-02-20 VITALS — BP 132/76 | HR 69 | Ht 65.0 in | Wt 120.6 lb

## 2018-02-20 DIAGNOSIS — K625 Hemorrhage of anus and rectum: Secondary | ICD-10-CM

## 2018-02-21 NOTE — Progress Notes (Signed)
Jessica Antigua, MD [address]  [place]  [place],  [address]  Main: [phone]  Fax: [phone]   Primary Care Physician: Leone Haven, MD   Chief Complaint  Patient presents with  . New Patient (Initial Visit)  . Follow-up    hospital f/u for BRB in stool x1 day, ?hemorrhoid related    HPI: Jessica [name] is a 85 y.o. female recently hospitalized with bright red blood per rectum and found to have a bleeding external hemorrhoid on rectal exam.  Patient was seen by GI and surgery, Dr. Rosana Hoes, as an inpatient.  As rectal exam was positive for an external hemorrhoid that has recently bled, and patient had also reported straining and constipation just prior to the hospital stay, hemoglobin remained stable, clinical findings were consistent with bleeding being from hemorrhoids itself.  Colonoscopy was not recommended in this setting.  Dr. Shann Medal note states hemorrhoidectomy was discussed with patient and family, but patient was restarted on her anticoagulation before hemorrhoidectomy was done, and as per his clinic visit notes posthospitalization patient and family do not want hemorrhoidectomy at this time.  States bowel movements resolved with MiraLAX daily since being home.  No further blood per rectum.  The patient denies abdominal or flank pain, anorexia, nausea or vomiting, dysphagia, change in bowel habits or black or bloody stools or weight loss.    Current Outpatient Medications  Medication Sig Dispense Refill  . acetaminophen (TYLENOL) 500 MG tablet Take 500 mg by mouth 2 (two) times daily as needed for moderate pain or headache.     . albuterol (PROAIR HFA) 108 (90 Base) MCG/ACT inhaler Inhale 2 puffs into the lungs every 6 (six) hours as needed for wheezing or shortness of breath. 1 Inhaler 5  . amLODipine (NORVASC) 5 MG tablet Take 1 tablet (5 mg total) by mouth daily. 90 tablet 3  . apixaban (ELIQUIS) 2.5 MG TABS tablet Take 2.5 mg by mouth 2  (two) times daily.    Marland Kitchen atenolol (TENORMIN) 25 MG tablet Take 1 tablet (25 mg total) by mouth daily. 90 tablet 3  . cholecalciferol 25 MCG (1000 UT) TABS Take 1,000 Units by mouth daily.    . clopidogrel (PLAVIX) 75 MG tablet Take 75 mg by mouth daily.    Marland Kitchen docusate sodium (COLACE) 100 MG capsule Take 1 capsule (100 mg total) by mouth 2 (two) times daily. 60 capsule 0  . polyethylene glycol (MIRALAX / GLYCOLAX) packet Take 17 g by mouth daily. 30 each 0  . rosuvastatin (CRESTOR) 5 MG tablet Take 1 tablet (5 mg total) by mouth daily. (Patient taking differently: Take 5 mg by mouth every evening. ) 90 tablet 3  . vitamin B-12 (CYANOCOBALAMIN) 1000 MCG tablet Take 1,000 mcg by mouth at bedtime.     No current facility-administered medications for this visit.     Allergies as of 02/20/2018 - Review Complete 02/20/2018  Allergen Reaction Noted  . Codeine Other (See Comments)   . Morphine Other (See Comments)   . Zyrtec [cetirizine] Other (See Comments) 01/05/2013    ROS:  General: Negative for anorexia, weight loss, fever, chills, fatigue, weakness. ENT: Negative for hoarseness, difficulty swallowing , nasal congestion. CV: Negative for chest pain, angina, palpitations, dyspnea on exertion, peripheral edema.  Respiratory: Negative for dyspnea at rest, dyspnea on exertion, cough, sputum, wheezing.  GI: See history of present illness. GU:  Negative for dysuria, hematuria, urinary incontinence, urinary frequency, nocturnal urination.  Endo: Negative for  unusual weight change.    Physical Examination:   BP 132/76   Pulse 69   Ht 5\' 5"  (1.651 m)   Wt 120 lb 9.6 oz (54.7 kg)   BMI 20.07 kg/m   General: Well-nourished, well-developed in no acute distress.  Eyes: No icterus. Conjunctivae pink. Mouth: Oropharyngeal mucosa moist and pink , no lesions erythema or exudate. Neck: Supple, Trachea midline Abdomen: Bowel sounds are normal, nontender, nondistended, no hepatosplenomegaly or  masses, no abdominal bruits or hernia , no rebound or guarding.   Extremities: No lower extremity edema. No clubbing or deformities. Neuro: Alert and oriented x 3.  Grossly intact. Skin: Warm and dry, no jaundice.   Psych: Alert and cooperative, normal mood and affect.   Labs: CMP     Component Value Date/Time   NA 139 01/22/2018 0431   NA 141 09/30/2017 0212   NA 141 12/15/2013 1721   K 3.6 01/22/2018 0431   K 3.1 (L) 12/15/2013 1721   CL 107 01/22/2018 0431   CL 103 12/15/2013 1721   CO2 26 01/22/2018 0431   CO2 28 12/15/2013 1721   GLUCOSE 93 01/22/2018 0431   GLUCOSE 115 (H) 12/15/2013 1721   BUN 21 01/22/2018 0431   BUN 24 09/30/2017 0212   BUN 26 (H) 12/15/2013 1721   CREATININE 1.14 (H) 01/22/2018 0431   CREATININE 1.40 (H) 12/15/2013 1721   CALCIUM 8.7 (L) 01/22/2018 0431   CALCIUM 9.3 12/15/2013 1721   PROT 7.0 01/20/2018 2108   PROT 6.8 01/03/2012 0837   ALBUMIN 4.2 01/20/2018 2108   ALBUMIN 4.4 01/03/2012 0837   AST 20 01/20/2018 2108   ALT 14 01/20/2018 2108   ALKPHOS 87 01/20/2018 2108   BILITOT 1.0 01/20/2018 2108   GFRNONAA 44 (L) 01/22/2018 0431   GFRNONAA 38 (L) 12/15/2013 1721   GFRAA 50 (L) 01/22/2018 0431   GFRAA 46 (L) 12/15/2013 1721   Lab Results  Component Value Date   WBC 5.8 01/20/2018   HGB 11.7 (L) 01/22/2018   HCT 36.6 01/22/2018   MCV 92.3 01/20/2018   PLT 150 01/20/2018    Imaging Studies: No results found.  Assessment and Plan:   Jessica [name] is a 85 y.o. y/o female here for follow-up post hospitalization for rectal bleeding, due to external hemorrhoids  Patient has not had any further rectal bleeding If this reoccurs, patient should contact us, her PCP, Dr. Shann Medal team or go to the ER and she verbalized understanding Continue high-fiber diet Continue MiraLAX daily Avoid constipation and if this occurs, patient should discuss this with primary care provider or our team  Last colonoscopy was in September 2009 with a  diminutive ascending colon polyp removed and diverticulosis reported.  Pathology report showed tubular adenoma.  This colonoscopy was done due to his CT at that time showing a cystic mass at the appendiceal orifice.  Colonoscopy itself did not find any cecal abnormalities.  Cecal biopsy showed benign colonic mucosa.  Patient subsequently underwent right colon segmental resection with pathology showing low-grade appendiceal mucinous neoplasm.  We discussed with the patient that since she had tubular adenoma polyp in 2009, surveillance colonoscopy is indicated every 5 to 10 years from 2009.  In addition she also had appendiceal neoplasm at the time.  Patient would like to discuss this with her family prior to scheduling any colonoscopy procedures. Risk of underlying malignancy were discussed but pt chooses not to schedule any procedures at this time. She is otherwise asymptomatic  Dr  Jessica [name]

## 2018-02-24 ENCOUNTER — Ambulatory Visit (INDEPENDENT_AMBULATORY_CARE_PROVIDER_SITE_OTHER): Payer: Medicare Other | Admitting: Family Medicine

## 2018-02-24 ENCOUNTER — Other Ambulatory Visit: Payer: Self-pay

## 2018-02-24 VITALS — BP 140/60 | HR 67 | Temp 97.6°F | Resp 14 | Wt 121.5 lb

## 2018-02-24 DIAGNOSIS — R05 Cough: Secondary | ICD-10-CM

## 2018-02-24 DIAGNOSIS — R058 Other specified cough: Secondary | ICD-10-CM

## 2018-02-24 DIAGNOSIS — J209 Acute bronchitis, unspecified: Secondary | ICD-10-CM | POA: Diagnosis not present

## 2018-02-24 DIAGNOSIS — J440 Chronic obstructive pulmonary disease with acute lower respiratory infection: Secondary | ICD-10-CM

## 2018-02-24 MED ORDER — DOXYCYCLINE HYCLATE 100 MG PO TABS: 100.0000 mg | ORAL_TABLET | Freq: Two times a day (BID) | ORAL | 0 refills | Status: AC

## 2018-02-24 MED ORDER — ALBUTEROL SULFATE HFA 108 (90 BASE) MCG/ACT IN AERS: 2.0000 | INHALATION_SPRAY | Freq: Four times a day (QID) | RESPIRATORY_TRACT | 5 refills | Status: AC | PRN

## 2018-02-24 MED ORDER — METHYLPREDNISOLONE 4 MG PO TBPK: ORAL_TABLET | ORAL | 0 refills | Status: DC

## 2018-02-24 MED ORDER — GUAIFENESIN ER 600 MG PO TB12: 600.0000 mg | ORAL_TABLET | Freq: Two times a day (BID) | ORAL | 0 refills | Status: DC

## 2018-02-24 NOTE — Progress Notes (Signed)
Subjective:    Patient ID: Jessica [name], female    DOB: 12-Feb-1929, 85 y.o.   MRN: [id number]  HPI   Patient presents to clinic complaining of a cough and chest congestion since 30 January.  Patient states when she coughs at times it is dry and other times it will produce yellow mucus.  Patient does have a albuterol inhaler, has used a few times with good success in helping to reduce congestion in chest.  Denies fever or chills.  Denies nausea/vomiting or diarrhea.  Denies chest pain.  Denies feeling faint or dizzy.  Patient states she did have a short nosebleed this morning from the right nare, lasted a few seconds but she was able to get it stopped quickly with using tissues.  Patient Active Problem List   Diagnosis Date Noted  . GI bleed 01/21/2018  . Rectal bleeding   . Weight loss 08/30/2017  . Low back pain 08/30/2017  . Depression, major, single episode, mild ([place]) 05/20/2017  . Fall 05/20/2017  . Hypoglycemia 04/04/2017  . Gout 06/05/2016  . AAA (abdominal aortic aneurysm) without rupture ([place]) 04/18/2016  . Loss of sense of smell 12/30/2015  . Memory difficulties 12/30/2015  . Injury of right lower extremity and cellulitis 11/29/2014  . Renal artery aneurysm ([place]) 10/26/2014  . Renal artery stenosis ([place]) 09/15/2014  . Renal arterial aneurysm ([place]) 09/15/2014  . CN (constipation) 09/06/2014  . Change in stool caliber 09/06/2014  . Atherosclerosis of aorto-iliac bypass graft ([place]) 08/17/2014  . Shortness of breath 05/12/2014  . Conjunctival hemorrhage of right eye 08/13/2013  . Medicare annual wellness visit, subsequent 04/22/2013  . Allergic rhinitis 11/26/2012  . COPD (chronic obstructive pulmonary disease) ([place]) 09/27/2011  . Hyperlipidemia 02/22/2011  . Long term current use of anticoagulant 04/12/2010  . HYPERTENSION, BENIGN 08/25/2009  . Atrial fibrillation ([place]) 03/07/2009  . Carotid stenosis 08/24/2008  . AV BLOCK, COMPLETE 06/17/2008  . Aneurysm of  abdominal vessel ([place]) 06/17/2008  . PACEMAKER-[place] 06/17/2008   Social History   Tobacco Use  . Smoking status: Former Smoker    Packs/day: 1.50    Years: 50.00    Pack years: 75.00    Types: Cigarettes    Last attempt to quit: 10/03/1995    Years since quitting: 22.4  . Smokeless tobacco: Never Used  Substance Use Topics  . Alcohol use: Yes    Alcohol/week: 1.0 standard drinks    Types: 1 Glasses of wine per week    Comment: per week   Review of Systems  Constitutional: Negative for chills, fatigue and fever.  HENT: Negative for congestion, ear pain, sinus pain and sore throat. Nosebleed x1   Eyes: Negative.   Respiratory: +cough, shortness of breath and wheezing.   Cardiovascular: Negative for chest pain, palpitations and leg swelling.  Gastrointestinal: Negative for abdominal pain, diarrhea, nausea and vomiting.  Genitourinary: Negative for dysuria, frequency and urgency.  Musculoskeletal: Negative for arthralgias and myalgias.  Skin: Negative for color change, pallor and rash.  Neurological: Negative for syncope, light-headedness and headaches.  Psychiatric/Behavioral: The patient is not nervous/anxious.       Objective:   Physical Exam Vitals signs and nursing note reviewed.  Constitutional:      General: She is not in acute distress.    Appearance: She is not toxic-appearing.  HENT:     Head: Normocephalic and atraumatic.     Right Ear: Tympanic membrane, ear canal and external ear normal. There is no impacted cerumen.  Left Ear: Tympanic membrane, ear canal and external ear normal. There is no impacted cerumen.     Nose: Congestion and rhinorrhea present.     Comments: Nosebleed stopped on its own    Mouth/Throat:     Mouth: Mucous membranes are moist.     Pharynx: No oropharyngeal exudate or posterior oropharyngeal erythema.  Eyes:     General: No scleral icterus.    Extraocular Movements: Extraocular movements intact.     Conjunctiva/sclera:  Conjunctivae normal.  Neck:     Musculoskeletal: Neck supple. No neck rigidity.  Cardiovascular:     Rate and Rhythm: Normal rate and regular rhythm.  Pulmonary:     Effort: Pulmonary effort is normal. No respiratory distress.     Breath sounds: No stridor. Wheezing and rhonchi present. No rales.     Comments: Scattered upper resp wheeze and rhonchi Lymphadenopathy:     Cervical: No cervical adenopathy.  Skin:    General: Skin is warm and dry.     Coloration: Skin is not pale.  Neurological:     Mental Status: She is alert and oriented to person, place, and time.  Psychiatric:        Mood and Affect: Mood normal.        Behavior: Behavior normal.    Vitals:   02/24/18 1535  BP: 140/60  Pulse: 67  Resp: 14  Temp: 97.6 F (36.4 C)  SpO2: 97%      Assessment & Plan:   Bronchitis with COPD, productive cough - offered chest x-ray in clinic today however patient declines.  Due to her COPD history and producing yellow phlegm with cough we will treat with course of doxycycline twice daily for 7 days, steroid burst and she will use albuterol inhaler as needed for any shortness of breath or wheezing.  Patient will also use Mucinex tablets as needed to help reduce cough.  Advised to get plenty of rest, increase water intake and do good handwashing.  Educated patient that if her shortness of breath is not helped by using the albuterol inhaler, she is to call office and or go to the ER right away for evaluation.  Advised patient to keep regular follow-up with PCP as planned.  Also advised she can return to clinic anytime if issues arise.

## 2018-02-27 ENCOUNTER — Ambulatory Visit (INDEPENDENT_AMBULATORY_CARE_PROVIDER_SITE_OTHER): Payer: Medicare Other

## 2018-02-27 ENCOUNTER — Other Ambulatory Visit: Payer: Self-pay

## 2018-02-27 ENCOUNTER — Ambulatory Visit: Payer: Medicare Other | Admitting: Surgery

## 2018-02-27 ENCOUNTER — Encounter: Payer: Self-pay | Admitting: Surgery

## 2018-02-27 VITALS — BP 160/76 | HR 61 | Temp 94.8°F | Resp 18 | Ht 65.0 in | Wt 119.2 lb

## 2018-02-27 DIAGNOSIS — I442 Atrioventricular block, complete: Secondary | ICD-10-CM | POA: Diagnosis not present

## 2018-02-27 DIAGNOSIS — K625 Hemorrhage of anus and rectum: Secondary | ICD-10-CM | POA: Diagnosis not present

## 2018-02-27 DIAGNOSIS — K649 Unspecified hemorrhoids: Secondary | ICD-10-CM | POA: Diagnosis not present

## 2018-02-27 NOTE — Progress Notes (Signed)
Surgical Clinic Progress/Follow-up Note   HPI:  85 y.o. Female presents to clinic for follow-up evaluation of formerly bleeding hemorrhoids while taking chronic Eliquis and Plavix as well. Patient reports she has not experienced any further bleeding since having started taking her current bowel regimen of increased water, fiber supplement, and Colace. She notes that she was advised by another physician to switch from Colace to daily Miralax, since which she's experienced loose BM's, for which she stopped the Miralax, followed by constipation until again taking Miralax with loose BM's again. She otherwise denies any bleeding per rectum, abdominal or anorectal pain, N/V, fever/chills, CP, or SOB.  Review of Systems:  Constitutional: denies any other weight loss, fever, chills, or sweats  Eyes: denies any other vision changes, history of eye injury  ENT: denies sore throat, hearing problems  Respiratory: denies shortness of breath, wheezing  Cardiovascular: denies chest pain, palpitations  Gastrointestinal: abdominal pain, N/V, and bowel function as per HPI Musculoskeletal: denies any other joint pains or cramps  Skin: Denies any other rashes or skin discolorations  Neurological: denies any other headache, dizziness, weakness  Psychiatric: denies any other depression, anxiety  All other review of systems: otherwise negative   Vital Signs:  BP (!) 160/76   Pulse 61   Temp (!) 94.8 F (34.9 C) (Temporal)   Resp 18   Ht 5\' 5"  (1.651 m)   Wt 119 lb 3.2 oz (54.1 kg)   SpO2 100%   BMI 19.84 kg/m    Physical Exam:  Constitutional:  -- Normal body habitus  -- Awake, alert, and oriented x3  Eyes:  -- Pupils equally round and reactive to light  -- No scleral icterus  Ear, nose, throat:  -- No jugular venous distension  -- No nasal drainage, bleeding Pulmonary:  -- No crackles -- Equal breath sounds bilaterally -- Breathing non-labored at rest Gastrointestinal:  -- Soft, nontender,  non-distended, no guarding/rebound  -- No abdominal masses appreciated, pulsatile or otherwise  Anorectal: -- Deferred per patient request Musculoskeletal / Integumentary:  -- Wounds or skin discoloration: None appreciated  -- Extremities: B/L UE and LE FROM, hands and feet warm, no edema  Neurologic:  -- Motor function: intact and symmetric  -- Sensation: intact and symmetric   Laboratory studies:  CBC Latest Ref Rng & Units 01/22/2018 01/22/2018 01/21/2018  WBC 4.0 - 10.5 K/uL - - -  Hemoglobin 12.0 - 15.0 g/dL 11.7(L) 11.7(L) 11.6(L)  Hematocrit 36.0 - 46.0 % 36.6 36.1 35.7(L)  Platelets 150 - 400 K/uL - - -   Imaging: No new pertinent imaging studies available for review at this time   Assessment:  85 y.o. yo Female with a problem list including...  Patient Active Problem List   Diagnosis Date Noted  . GI bleed 01/21/2018  . Rectal bleeding   . Weight loss 08/30/2017  . Low back pain 08/30/2017  . Depression, major, single episode, mild ([place]) 05/20/2017  . Fall 05/20/2017  . Hypoglycemia 04/04/2017  . Gout 06/05/2016  . AAA (abdominal aortic aneurysm) without rupture ([place]) 04/18/2016  . Loss of sense of smell 12/30/2015  . Memory difficulties 12/30/2015  . Injury of right lower extremity and cellulitis 11/29/2014  . Renal artery aneurysm ([place]) 10/26/2014  . Renal artery stenosis ([place]) 09/15/2014  . Renal arterial aneurysm ([place]) 09/15/2014  . CN (constipation) 09/06/2014  . Change in stool caliber 09/06/2014  . Atherosclerosis of aorto-iliac bypass graft ([place]) 08/17/2014  . Shortness of breath 05/12/2014  . Conjunctival hemorrhage  of right eye 08/13/2013  . Medicare annual wellness visit, subsequent 04/22/2013  . Allergic rhinitis 11/26/2012  . COPD (chronic obstructive pulmonary disease) ([place]) 09/27/2011  . Hyperlipidemia 02/22/2011  . Long term current use of anticoagulant 04/12/2010  . HYPERTENSION, BENIGN 08/25/2009  . Atrial fibrillation ([place]) 03/07/2009  .  Carotid stenosis 08/24/2008  . AV BLOCK, COMPLETE 06/17/2008  . Aneurysm of abdominal vessel ([place]) 06/17/2008  . PACEMAKER-St.Jude 06/17/2008    presents to clinic for follow-up evaluation of previously bleeding otherwise asymptomatic hemorrhoids, since resolved with initiation of bowel regimen despite having resumed both chronic Eliquis and Plavix s/p renal artery stenting, complicated by comorbidities including advanced chronological age, HTN, HLD, CAD with complete heart block s/p PPM, AAA s/p open repair (2009), CKD (stage 3), COPD, gout, and former chronic tobacco abuse (smoking).  Plan:   - continue to maintain hydration and high-fiber diet +/- fiber supplement  - once - twice daily Colace stool softener +/- Miralax as needed for constipation despite above  - indications and role for elective hemorrhoidectomy was also discussed, though patient declines at this time  - patient strongly encouraged to call office if any bleeding or uncontrolled constipation  - return to clinic as needed, instructed to call office if any questions or concerns  - medical management of comorbidities as per medical team/PCP  All of the above recommendations were discussed with the patient and patient's family, and all of patient's and family's questions were answered to their expressed satisfaction.  -- Marilynne Drivers Rosana Hoes, MD, [place]: [facility] - Partnering for exceptional care. Office: [phone]

## 2018-02-27 NOTE — Patient Instructions (Signed)
Patient is to return to the office with Dr.Davis as needed.  Call the office with any questions or concerns.

## 2018-02-28 LAB — CUP PACEART REMOTE DEVICE CHECK
Battery Remaining Longevity: 97 mo
Battery Remaining Percentage: 95.5 %
Battery Voltage: 2.99 V
Brady Statistic AP VP Percent: 99 %
Brady Statistic AP VS Percent: 1 %
Brady Statistic AS VP Percent: 1 %
Brady Statistic AS VS Percent: 1 %
Brady Statistic RA Percent Paced: 1 %
Brady Statistic RV Percent Paced: 99 %
Date Time Interrogation Session: 20200206070013
Implantable Lead Implant Date: 20090610
Implantable Lead Implant Date: 20090610
Implantable Lead Location: 753859
Implantable Lead Location: 753860
Implantable Lead Model: 5076
Implantable Pulse Generator Implant Date: 20190204
Lead Channel Impedance Value: 390 Ohm
Lead Channel Impedance Value: 410 Ohm
Lead Channel Pacing Threshold Amplitude: 0.75 V
Lead Channel Pacing Threshold Pulse Width: 0.5 ms
Lead Channel Sensing Intrinsic Amplitude: 1.2 mV
Lead Channel Sensing Intrinsic Amplitude: 5 mV
Lead Channel Setting Pacing Amplitude: 2 V
Lead Channel Setting Pacing Amplitude: 2.5 V
Lead Channel Setting Pacing Pulse Width: 0.5 ms
Lead Channel Setting Sensing Sensitivity: 2 mV
Pulse Gen Model: 2272
Pulse Gen Serial Number: 8991929

## 2018-03-03 ENCOUNTER — Ambulatory Visit (INDEPENDENT_AMBULATORY_CARE_PROVIDER_SITE_OTHER): Payer: Medicare Other

## 2018-03-03 ENCOUNTER — Ambulatory Visit (INDEPENDENT_AMBULATORY_CARE_PROVIDER_SITE_OTHER): Payer: Medicare Other | Admitting: Vascular Surgery

## 2018-03-03 ENCOUNTER — Encounter (INDEPENDENT_AMBULATORY_CARE_PROVIDER_SITE_OTHER): Payer: Self-pay | Admitting: Vascular Surgery

## 2018-03-03 VITALS — BP 148/82 | HR 66 | Resp 14 | Ht 65.0 in | Wt 116.8 lb

## 2018-03-03 DIAGNOSIS — I701 Atherosclerosis of renal artery: Secondary | ICD-10-CM

## 2018-03-03 DIAGNOSIS — I6523 Occlusion and stenosis of bilateral carotid arteries: Secondary | ICD-10-CM

## 2018-03-03 DIAGNOSIS — I10 Essential (primary) hypertension: Secondary | ICD-10-CM

## 2018-03-03 DIAGNOSIS — I4821 Permanent atrial fibrillation: Secondary | ICD-10-CM

## 2018-03-03 DIAGNOSIS — J438 Other emphysema: Secondary | ICD-10-CM

## 2018-03-03 DIAGNOSIS — Z87891 Personal history of nicotine dependence: Secondary | ICD-10-CM

## 2018-03-03 DIAGNOSIS — I70309 Unspecified atherosclerosis of unspecified type of bypass graft(s) of the extremities, unspecified extremity: Secondary | ICD-10-CM | POA: Diagnosis not present

## 2018-03-03 MED ORDER — CLOPIDOGREL BISULFATE 75 MG PO TABS: 75.0000 mg | ORAL_TABLET | Freq: Every day | ORAL | 3 refills | Status: DC

## 2018-03-03 NOTE — Progress Notes (Signed)
MRN : [id number]  Jessica [name] is a 85 y.o. (March 24, 1929) female who presents with chief complaint of  Chief Complaint  Patient presents with  . Follow-up  .  History of Present Illness:   The patient returns to the office for followup and review of the noninvasive studies regarding renal vascular hypertension and renal artery stenosis.  She is s/p renal artery stenting on 12/03/2017.  Procedure(s) Performed: 1. Ultrasound guidance for vascular access right femoral artery 2. Catheter placement into right renal artery from right femoral approach 3. Aortogram and selective right renal angiogram 4. Balloon expandable stent placement to 7 mm with a 6 mm diameter x 16 mm length lifestream   There have been no interval changes in the patient's blood pressure control.  She denies any major changes in is medications.  The patient denies headache or flushing.  No flank or unusual back pain.    There have been no significant changes to the patient's overall health care.  No interval shortening of the patient's walking distance or new symptoms consistent with claudication.  The patient denies the  development of rest pain symptoms. No new ulcers or wounds have occurred since the last visit.  The patient denies amaurosis fugax or recent TIA symptoms. There are no recent neurological changes noted. The patient denies history of DVT, PE or superficial thrombophlebitis. The patient denies recent episodes of angina or shortness of breath.   Duplex ultrasound of the renal artery widely patent right renal artery, left renal artery unchanged   Current Meds  Medication Sig  . acetaminophen (TYLENOL) 500 MG tablet Take 500 mg by mouth 2 (two) times daily as needed for moderate pain or headache.   . albuterol (PROAIR HFA) 108 (90 Base) MCG/ACT inhaler Inhale 2 puffs into the lungs every 6 (six) hours as needed for wheezing or shortness of breath.   Marland Kitchen amLODipine (NORVASC) 5 MG tablet Take 1 tablet (5 mg total) by mouth daily.  Marland Kitchen apixaban (ELIQUIS) 2.5 MG TABS tablet Take 2.5 mg by mouth 2 (two) times daily.  Marland Kitchen atenolol (TENORMIN) 25 MG tablet Take 1 tablet (25 mg total) by mouth daily.  . cholecalciferol 25 MCG (1000 UT) TABS Take 1,000 Units by mouth daily.  . clopidogrel (PLAVIX) 75 MG tablet Take 1 tablet (75 mg total) by mouth daily.  Marland Kitchen docusate sodium (COLACE) 100 MG capsule Take 1 capsule (100 mg total) by mouth 2 (two) times daily.  Marland Kitchen doxycycline (VIBRA-TABS) 100 MG tablet Take 1 tablet (100 mg total) by mouth 2 (two) times daily for 7 days.  Marland Kitchen guaiFENesin (MUCINEX) 600 MG 12 hr tablet Take 1 tablet (600 mg total) by mouth 2 (two) times daily.  . methylPREDNISolone (MEDROL DOSEPAK) 4 MG TBPK tablet Take according to pack instructions  . polyethylene glycol (MIRALAX / GLYCOLAX) packet Take 17 g by mouth daily.  . rosuvastatin (CRESTOR) 5 MG tablet Take 1 tablet (5 mg total) by mouth daily. (Patient taking differently: Take 5 mg by mouth every evening. )  . vitamin B-12 (CYANOCOBALAMIN) 1000 MCG tablet Take 1,000 mcg by mouth at bedtime.  . [DISCONTINUED] clopidogrel (PLAVIX) 75 MG tablet Take 75 mg by mouth daily.    Past Medical History:  Diagnosis Date  . AAA (abdominal aortic aneurysm) ([place])   . Abdominal aneurysm without mention of rupture   . Atrioventricular block, complete ([place])   . Cardiac pacemaker in situ   . Gout   . Hernia   . Hyperlipidemia   .  Hypertension   . Rectal bleeding     Past Surgical History:  Procedure Laterality Date  . ABDOMINAL AORTIC ANEURYSM REPAIR    . HERNIA REPAIR    . ohter     growth on colon surgery  . PPM GENERATOR CHANGEOUT N/A 02/25/2017   Procedure: PPM GENERATOR CHANGEOUT;  Surgeon: Deboraha Sprang, MD;  Location: [facility];  Service: Cardiovascular;  Laterality: N/A;  . RENAL ANGIOGRAPHY Right 12/03/2017   Procedure: RENAL ANGIOGRAPHY;  Surgeon: Katha Cabal, MD;   Location: [facility];  Service: Cardiovascular;  Laterality: Right;  . TONSILLECTOMY      Social History Social History   Tobacco Use  . Smoking status: Former Smoker    Packs/day: 1.50    Years: 50.00    Pack years: 75.00    Types: Cigarettes    Last attempt to quit: 10/03/1995    Years since quitting: 22.4  . Smokeless tobacco: Never Used  Substance Use Topics  . Alcohol use: Yes    Alcohol/week: 1.0 standard drinks    Types: 1 Glasses of wine per week    Comment: per week  . Drug use: No    Family History Family History  Problem Relation Age of Onset  . Heart disease Father   . Alcohol abuse Father   . Cancer Brother     Allergies  Allergen Reactions  . Codeine Other (See Comments)    Made her feel crazy  . Morphine Other (See Comments)    Made her feel crazy  . Zyrtec [Cetirizine] Other (See Comments)    Causes facial numbness     REVIEW OF SYSTEMS (Negative unless checked)  Constitutional: [] Weight loss  [] Fever  [] Chills Cardiac: [] Chest pain   [] Chest pressure   [] Palpitations   [] Shortness of breath when laying flat   [x] Shortness of breath with exertion. Vascular:  [x] Pain in legs with walking   [] Pain in legs at rest  [] History of DVT   [] Phlebitis   [] Swelling in legs   [] Varicose veins   [] Non-healing ulcers Pulmonary:   [] Uses home oxygen   [] Productive cough   [] Hemoptysis   [] Wheeze  [x] COPD   [] Asthma Neurologic:  [] Dizziness   [] Seizures   [] History of stroke   [] History of TIA  [] Aphasia   [] Vissual changes   [] Weakness or numbness in arm   [] Weakness or numbness in leg Musculoskeletal:   [] Joint swelling   [x] Joint pain   [] Low back pain Hematologic:  [] Easy bruising  [] Easy bleeding   [] Hypercoagulable state   [] Anemic Gastrointestinal:  [] Diarrhea   [] Vomiting  [x] Gastroesophageal reflux/heartburn   [] Difficulty swallowing. Genitourinary:  [] Chronic kidney disease   [] Difficult urination  [] Frequent urination   [] Blood in  urine Skin:  [] Rashes   [] Ulcers  Psychological:  [] History of anxiety   []  History of major depression.  Physical Examination  Vitals:   03/03/18 1143  BP: (!) 148/82  Pulse: 66  Resp: 14  Weight: 116 lb 12.8 oz (53 kg)  Height: 5\' 5"  (1.651 m)   Body mass index is 19.44 kg/m. Gen: WD/WN, NAD Head: /AT, No temporalis wasting.  Ear/Nose/Throat: Hearing grossly intact, nares w/o erythema or drainage Eyes: PER, EOMI, sclera nonicteric.  Neck: Supple, no large masses.   Pulmonary:  Good air movement, no audible wheezing bilaterally, no use of accessory muscles.  Cardiac: RRR, no JVD Vascular:  Vessel Right Left  Radial Palpable Palpable  Brachial Palpable Palpable  Carotid Palpable Palpable  PT Trace Palpable Trace Palpable  DP Trace Palpable Trace Palpable  Gastrointestinal: Non-distended. No guarding/no peritoneal signs.  Musculoskeletal: M/S 5/5 throughout.  No deformity or atrophy.  Neurologic: CN 2-12 intact. Symmetrical.  Speech is fluent. Motor exam as listed above. Psychiatric: Judgment intact, Mood & affect appropriate for pt's clinical situation. Dermatologic: No rashes or ulcers noted.  No changes consistent with cellulitis. Lymph : No lichenification or skin changes of chronic lymphedema.  CBC Lab Results  Component Value Date   WBC 5.8 01/20/2018   HGB 11.7 (L) 01/22/2018   HCT 36.6 01/22/2018   MCV 92.3 01/20/2018   PLT 150 01/20/2018    BMET    Component Value Date/Time   NA 139 01/22/2018 0431   NA 141 09/30/2017 0212   NA 141 12/15/2013 1721   K 3.6 01/22/2018 0431   K 3.1 (L) 12/15/2013 1721   CL 107 01/22/2018 0431   CL 103 12/15/2013 1721   CO2 26 01/22/2018 0431   CO2 28 12/15/2013 1721   GLUCOSE 93 01/22/2018 0431   GLUCOSE 115 (H) 12/15/2013 1721   BUN 21 01/22/2018 0431   BUN 24 09/30/2017 0212   BUN 26 (H) 12/15/2013 1721   CREATININE 1.14 (H) 01/22/2018 0431   CREATININE 1.40 (H) 12/15/2013 1721   CALCIUM 8.7 (L) 01/22/2018  0431   CALCIUM 9.3 12/15/2013 1721   GFRNONAA 44 (L) 01/22/2018 0431   GFRNONAA 38 (L) 12/15/2013 1721   GFRAA 50 (L) 01/22/2018 0431   GFRAA 46 (L) 12/15/2013 1721   CrCl cannot be calculated (Patient's most recent lab result is older than the maximum 21 days allowed.).  COAG Lab Results  Component Value Date   INR 1.04 01/21/2018   INR 0.99 04/04/2017   INR 1.3 02/27/2017    Radiology No results found.   Assessment/Plan 1. Renal artery stenosis (HCC) BP today was acceptable Given patient's atherosclerosis and PAD optimal control of the patient's hypertension is important.  The patient's BP and noninvasive studies support the previous intervention is patent. No further intervention is indicated at this time.  Therefore the patient  will continue the current medications, no changes at this time.  The primary medical service will continue aggressive antihypertensive therapy as per the AHA guidelines   - VAS US RENAL ARTERY DUPLEX; Future  2. Atherosclerosis of aorto-iliac bypass graft (HCC)  Recommend:  The patient has evidence of atherosclerosis of the lower extremities with claudication.  The patient does not voice lifestyle limiting changes at this point in time.  Noninvasive studies do not suggest clinically significant change.  No invasive studies, angiography or surgery at this time The patient should continue walking and begin a more formal exercise program.  The patient should continue antiplatelet therapy and aggressive treatment of the lipid abnormalities  No changes in the patient's medications at this time  The patient should continue wearing graduated compression socks 10-15 mmHg strength to control the mild edema.    3. Bilateral carotid artery stenosis Recommend:  Given the patient's asymptomatic subcritical stenosis no further invasive testing or surgery at this time.  Duplex ultrasound shows <70% stenosis bilaterally.  Continue antiplatelet  therapy as prescribed Continue management of CAD, HTN and Hyperlipidemia Healthy heart diet,  encouraged exercise at least 4 times per week Follow up in 12 months with duplex ultrasound and physical exam   4. Permanent atrial fibrillation Continue antiarrhythmia medications as already ordered, these medications have been reviewed and there are no changes at this time.  Continue anticoagulation as ordered by Cardiology Service   5. HYPERTENSION, BENIGN Continue antihypertensive medications as already ordered, these medications have been reviewed and there are no changes at this time.   6. Other emphysema ([place]) Continue pulmonary medications and aerosols as already ordered, these medications have been reviewed and there are no changes at this time.      Hortencia Pilar, MD  03/03/2018 8:37 PM

## 2018-03-10 NOTE — Progress Notes (Signed)
Remote pacemaker transmission.   

## 2018-03-11 ENCOUNTER — Encounter: Payer: Self-pay | Admitting: Internal Medicine

## 2018-03-11 ENCOUNTER — Ambulatory Visit: Payer: Medicare Other | Admitting: Internal Medicine

## 2018-03-11 VITALS — BP 118/67 | HR 62 | Ht 65.0 in | Wt 118.0 lb

## 2018-03-11 DIAGNOSIS — I4819 Other persistent atrial fibrillation: Secondary | ICD-10-CM

## 2018-03-11 DIAGNOSIS — I442 Atrioventricular block, complete: Secondary | ICD-10-CM | POA: Diagnosis not present

## 2018-03-11 DIAGNOSIS — Z950 Presence of cardiac pacemaker: Secondary | ICD-10-CM | POA: Diagnosis not present

## 2018-03-11 NOTE — Patient Instructions (Addendum)
Medication Instructions:  - Your physician recommends that you continue on your current medications as directed. Please refer to the Current Medication list given to you today.  If you need a refill on your cardiac medications before your next appointment, please call your pharmacy.   Lab work: - none ordered  If you have labs (blood work) drawn today and your tests are completely normal, you will receive your results only by: Marland Kitchen MyChart Message (if you have MyChart) OR . A paper copy in the mail If you have any lab test that is abnormal or we need to change your treatment, we will call you to review the results.  Testing/Procedures: - none ordered  Follow-Up: At [facility], you and your health needs are our priority.  As part of our continuing mission to provide you with exceptional heart care, we have created designated Provider Care Teams.  These Care Teams include your primary Cardiologist (physician) and Advanced Practice Providers (APPs -  Physician Assistants and Nurse Practitioners) who all work together to provide you with the care you need, when you need it. . You will need a follow up appointment in 1 year with Dr. Caryl Comes.  Please call our office 2 months in advance to schedule this appointment.   Remote monitoring is used to monitor your Pacemaker of ICD from home. This monitoring reduces the number of office visits required to check your device to one time per year. It allows [place] to keep an eye on the functioning of your device to ensure it is working properly. You are scheduled for a device check from home on 06/05/2018. You may send your transmission at any time that day. If you have a wireless device, the transmission will be sent automatically. After your physician reviews your transmission, you will receive a postcard with your next transmission date.   Any Other Special Instructions Will Be Listed Below (If Applicable). - N/A

## 2018-03-11 NOTE — Progress Notes (Signed)
Patient Care Team: Leone Haven, MD as PCP - General (Family Medicine) Deboraha Sprang, MD (Cardiology) Minna Merritts, MD (Cardiology)   HPI  Jessica [name] is a 85 y.o. female Seen in follow-up for a pacemaker implanted for complete heart block; she has persistent Afib.    She is doing well today. Accompanied by family. She does not feel dizzy when standing up after sitting. She checks her blood pressure at home adding they are normally higher than her blood pressure today in clinic. She uses a walker for assistance. She denies waking up SOB or orthopnea or chest pain.  No peripheral edema.  No palpitations.  Blood pressures at home have been in the 130 range.    Device History: Pacemaker  implanted 1980  for CHB Changed out 2009. 2019   Date Lead Status   1980 CPI 0505 Abandoned   2009 A 5076   2009 V 1688       Thromboembolic risk factors ( age  -2, HTN-1, TIA/CVA-2, Vasc disease -1, Gender-1) for a CHADSVASc Score of 7   10/19 she underwent renal artery stenting  DATE TEST    7/18 Myoview   EF 65 % No ischemia  8//18 Echo    EF 65 % Mod LVH        Date Cr K Hgb  7/17 1.36 4.0   5/18     14.4  3/19  1.58 3.3-->5.0 14.3-->11.9  1/20 1.14 3.6 11.7        Past Medical History:  Diagnosis Date   AAA (abdominal aortic aneurysm) ([place])    Abdominal aneurysm without mention of rupture    Atrioventricular block, complete ([place])    Cardiac pacemaker in situ    Gout    Hernia    Hyperlipidemia    Hypertension    Rectal bleeding     Past Surgical History:  Procedure Laterality Date   ABDOMINAL AORTIC ANEURYSM REPAIR     HERNIA REPAIR     ohter     growth on colon surgery   PPM GENERATOR CHANGEOUT N/A 02/25/2017   Procedure: PPM GENERATOR CHANGEOUT;  Surgeon: Deboraha Sprang, MD;  Location: [facility];  Service: Cardiovascular;  Laterality: N/A;   RENAL ANGIOGRAPHY Right 12/03/2017   Procedure: RENAL ANGIOGRAPHY;   Surgeon: Katha Cabal, MD;  Location: [facility];  Service: Cardiovascular;  Laterality: Right;   TONSILLECTOMY      Current Outpatient Medications  Medication Sig Dispense Refill   acetaminophen (TYLENOL) 500 MG tablet Take 500 mg by mouth 2 (two) times daily as needed for moderate pain or headache.      albuterol (PROAIR HFA) 108 (90 Base) MCG/ACT inhaler Inhale 2 puffs into the lungs every 6 (six) hours as needed for wheezing or shortness of breath. 1 Inhaler 5   amLODipine (NORVASC) 5 MG tablet Take 1 tablet (5 mg total) by mouth daily. 90 tablet 3   apixaban (ELIQUIS) 2.5 MG TABS tablet Take 2.5 mg by mouth 2 (two) times daily.     atenolol (TENORMIN) 25 MG tablet Take 1 tablet (25 mg total) by mouth daily. 90 tablet 3   cholecalciferol 25 MCG (1000 UT) TABS Take 1,000 Units by mouth daily.     clopidogrel (PLAVIX) 75 MG tablet Take 1 tablet (75 mg total) by mouth daily. 90 tablet 3   docusate sodium (COLACE) 100 MG capsule Take 1 capsule (100 mg total) by mouth  2 (two) times daily. 60 capsule 0   rosuvastatin (CRESTOR) 5 MG tablet Take 1 tablet (5 mg total) by mouth daily. (Patient taking differently: Take 5 mg by mouth every evening. ) 90 tablet 3   vitamin B-12 (CYANOCOBALAMIN) 1000 MCG tablet Take 1,000 mcg by mouth at bedtime.     No current facility-administered medications for this visit.     Allergies  Allergen Reactions   Codeine Other (See Comments)    Made her feel crazy   Morphine Other (See Comments)    Made her feel crazy   Zyrtec [Cetirizine] Other (See Comments)    Causes facial numbness    Review of Systems negative except from HPI and PMH  Physical Exam BP 118/67 (BP Location: Left Arm, Patient Position: Sitting, Cuff Size: Normal)    Pulse 62    Ht 5\' 5"  (1.651 m)    Wt 118 lb (53.5 kg)    BMI 19.64 kg/m   Well developed and well nourished in no acute distress HENT normal Neck supple with JVP-flat Clear Device pocket well  healed; without hematoma or erythema.  There is no tethering  Regular rate and rhythm, no gallop 2/6 murmur Abd-soft with active BS No Clubbing cyanosis  edema Skin-warm and dry A & Oriented  Grossly normal sensory and motor function     ECG personally reviewed   sinus with P-synchronous/ AV  pacing   Assessment and  Plan  Atrial fibrillation-persistent  Hypertension    Orthostatic hypotension   AV block-complete -intermittent   Grade V3 renal insufficiency  Peripheral vascular disease status post AAA repair  Pacemaker-[employer]. Jude      Surprised that there has been no significant orthostatic dizziness with her somewhat lower systolic blood pressure.  Holding sinus   Renal function improved  On Anticoagulation;  No bleeding issues   No falls   F/U in 1 year.   We spent more than 50% of our >25 min visit in face to face counseling regarding the above    I, Margit Banda am acting as a scribe for Virl Axe, M.D. I have reviewed the above documentation for accuracy and completeness, and I agree with the above.    Signed, Virl Axe, MD 03/11/18 [place], [place]

## 2018-03-31 ENCOUNTER — Telehealth: Payer: Self-pay | Admitting: Gastroenterology

## 2018-03-31 NOTE — Telephone Encounter (Signed)
At the patient's request, I talked to the patient's son Annie Main today, about her previous history of appendiceal neoplasm, and tubular adenoma polyp in 2009.  Due to her history of appendiceal neoplasm, I discussed that a colonoscopy should be done at least every 5 years.  However, patient and son, both refuse a colonoscopy and understand the risk of underlying polyps or malignancy.  They state that if a cancer is found they do not want to undergo further surgeries and therefore are not interested in procedures at this time, I also discussed that if a small polyp was found, it can be removed and colon cancer prevented moving forward, but they refuse a procedure at this time.  I have encouraged them to call us back with any questions or concerns or if they have changed their mind in the future.  They both voiced understanding and our contact number was given to them.

## 2018-06-05 ENCOUNTER — Encounter: Payer: Medicare Other | Admitting: *Deleted

## 2018-06-05 ENCOUNTER — Other Ambulatory Visit: Payer: Self-pay

## 2018-06-06 ENCOUNTER — Telehealth: Payer: Self-pay

## 2018-06-06 NOTE — Telephone Encounter (Signed)
Unable to leave a message for patient to remind of missed remote transmission.  

## 2018-06-11 ENCOUNTER — Encounter: Payer: Self-pay | Admitting: Cardiology

## 2018-06-30 ENCOUNTER — Telehealth: Payer: Self-pay | Admitting: Student

## 2018-06-30 ENCOUNTER — Other Ambulatory Visit: Payer: Self-pay | Admitting: Internal Medicine

## 2018-06-30 ENCOUNTER — Ambulatory Visit (INDEPENDENT_AMBULATORY_CARE_PROVIDER_SITE_OTHER): Payer: Medicare Other | Admitting: *Deleted

## 2018-06-30 DIAGNOSIS — I4891 Unspecified atrial fibrillation: Secondary | ICD-10-CM

## 2018-06-30 DIAGNOSIS — I442 Atrioventricular block, complete: Secondary | ICD-10-CM

## 2018-06-30 LAB — CUP PACEART REMOTE DEVICE CHECK
Battery Remaining Longevity: 113 mo
Battery Remaining Percentage: 95.5 %
Battery Voltage: 3.01 V
Brady Statistic AP VP Percent: 99 %
Brady Statistic AP VS Percent: 1 %
Brady Statistic AS VP Percent: 1 %
Brady Statistic AS VS Percent: 1 %
Brady Statistic RA Percent Paced: 57 %
Brady Statistic RV Percent Paced: 99 %
Date Time Interrogation Session: 20200604163841
Implantable Lead Implant Date: 20090610
Implantable Lead Implant Date: 20090610
Implantable Lead Location: 753859
Implantable Lead Location: 753860
Implantable Lead Model: 5076
Implantable Pulse Generator Implant Date: 20190204
Lead Channel Impedance Value: 360 Ohm
Lead Channel Impedance Value: 440 Ohm
Lead Channel Pacing Threshold Amplitude: 0.75 V
Lead Channel Pacing Threshold Amplitude: 0.75 V
Lead Channel Pacing Threshold Pulse Width: 0.5 ms
Lead Channel Pacing Threshold Pulse Width: 0.5 ms
Lead Channel Sensing Intrinsic Amplitude: 0.9 mV
Lead Channel Sensing Intrinsic Amplitude: 12 mV
Lead Channel Setting Pacing Amplitude: 1 V
Lead Channel Setting Pacing Amplitude: 2 V
Lead Channel Setting Pacing Pulse Width: 0.5 ms
Lead Channel Setting Sensing Sensitivity: 2 mV
Pulse Gen Model: 2272
Pulse Gen Serial Number: 8991929

## 2018-06-30 NOTE — Telephone Encounter (Signed)
Pt doing well. Will call if any issues symptoms arise.

## 2018-06-30 NOTE — Telephone Encounter (Signed)
Previously thought to be persistent Afib, but had been back in [facility] for a majority of the time between Feb and May. She denies changes in symptoms. She has not missed any of her Eliquis. Will forward to Afib clinic to check on her at least once and to Dr. Caryl Comes as an Juluis Rainier. May not need any changes due to lack of symptoms.    Legrand Como [address] [place], PA-C 06/30/2018 10:12 AM

## 2018-06-30 NOTE — Telephone Encounter (Signed)
Prescription refill request for Eliquis received.  Last office visit: Dr. Caryl Comes (03-11-2018) Scr: 1.14 (01-22-2018) Age: 85 yrs old Weight: 53.3kg  Prescription refill sent.

## 2018-07-03 NOTE — Telephone Encounter (Signed)
Noted  

## 2018-07-08 NOTE — Progress Notes (Signed)
Remote pacemaker transmission.   

## 2018-08-02 ENCOUNTER — Other Ambulatory Visit: Payer: Self-pay | Admitting: Cardiovascular Disease

## 2018-08-02 DIAGNOSIS — I4891 Unspecified atrial fibrillation: Secondary | ICD-10-CM

## 2018-08-02 DIAGNOSIS — I10 Essential (primary) hypertension: Secondary | ICD-10-CM

## 2018-08-02 DIAGNOSIS — I442 Atrioventricular block, complete: Secondary | ICD-10-CM

## 2018-08-04 ENCOUNTER — Ambulatory Visit (INDEPENDENT_AMBULATORY_CARE_PROVIDER_SITE_OTHER): Payer: Medicare Other

## 2018-08-04 ENCOUNTER — Other Ambulatory Visit: Payer: Self-pay

## 2018-08-04 VITALS — BP 140/74 | HR 71 | Wt 121.0 lb

## 2018-08-04 DIAGNOSIS — Z0000 Encounter for general adult medical examination without abnormal findings: Secondary | ICD-10-CM

## 2018-08-04 NOTE — Progress Notes (Signed)
Subjective:   Jessica [name] is a 85 y.o. female who presents for Medicare Annual (Subsequent) preventive examination.  Review of Systems:  No ROS.  Medicare Wellness Virtual Visit.  Visual/audio telehealth visit, UTA vital signs.  Vital signs provided by patient.  See social history for additional risk factors.   Cardiac Risk Factors include: advanced age (>63men, >31 women);hypertension     Objective:     Vitals: BP 140/74 (BP Location: Left Arm, Patient Position: Sitting, Cuff Size: Normal)   Pulse 71   Wt 121 lb (54.9 kg)   BMI 20.14 kg/m   Body mass index is 20.14 kg/m.  Advanced Directives 08/04/2018 01/21/2018 01/20/2018 12/03/2017 04/23/2017 04/04/2017 04/04/2017  Does Patient Have a Medical Advance Directive? Yes Yes No Yes Yes Yes Yes  Type of Paramedic of [place];Living will [place];Living will - [place] of facility DNR (pink MOST or yellow form);Living will;Healthcare Power of Attorney Out of facility DNR (pink MOST or yellow form);Living will Out of facility DNR (pink MOST or yellow form);Living will  Does patient want to make changes to medical advance directive? No - Patient declined No - Patient declined - No - Patient declined No - Patient declined No - Patient declined No - Patient declined  Copy of [place] in Chart? Yes - validated most recent copy scanned in chart (See row information) No - copy requested - No - copy requested - - -  Would patient like information on creating a medical advance directive? - - No - Patient declined - - - -  Pre-existing out of facility DNR order (yellow form or pink MOST form) - - - - Yellow form placed in chart (order not valid for inpatient use) Yellow form placed in chart (order not valid for inpatient use);Physician notified to receive inpatient order Pink MOST form placed in chart (order not valid for inpatient use);Yellow form placed in  chart (order not valid for inpatient use)    Tobacco Social History   Tobacco Use  Smoking Status Former Smoker  . Packs/day: 1.50  . Years: 50.00  . Pack years: 75.00  . Types: Cigarettes  . Quit date: 10/03/1995  . Years since quitting: 22.8  Smokeless Tobacco Never Used     Counseling given: Not Answered   Clinical Intake:  Pre-visit preparation completed: Yes        Diabetes: No  How often do you need to have someone help you when you read instructions, pamphlets, or other written materials from your doctor or pharmacy?: 1 - Never  Interpreter Needed?: No     Past Medical History:  Diagnosis Date  . AAA (abdominal aortic aneurysm) ([place])   . Abdominal aneurysm without mention of rupture   . Atrioventricular block, complete ([place])   . Cardiac pacemaker in situ   . Gout   . Hernia   . Hyperlipidemia   . Hypertension   . Rectal bleeding    Past Surgical History:  Procedure Laterality Date  . ABDOMINAL AORTIC ANEURYSM REPAIR    . HERNIA REPAIR    . ohter     growth on colon surgery  . PPM GENERATOR CHANGEOUT N/A 02/25/2017   Procedure: PPM GENERATOR CHANGEOUT;  Surgeon: Deboraha Sprang, MD;  Location: [facility];  Service: Cardiovascular;  Laterality: N/A;  . RENAL ANGIOGRAPHY Right 12/03/2017   Procedure: RENAL ANGIOGRAPHY;  Surgeon: Katha Cabal, MD;  Location: [facility] INVASIVE CV  LAB;  Service: Cardiovascular;  Laterality: Right;  . TONSILLECTOMY     Family History  Problem Relation Age of Onset  . Heart disease Father   . Alcohol abuse Father   . Cancer Brother    Social History   Socioeconomic History  . Marital status: Widowed    Spouse name: Not on file  . Number of children: 2  . Years of education: Not on file  . Highest education level: Not on file  Occupational History  . Occupation: Retired    Fish farm manager: RETIRED  Social Needs  . Financial resource strain: Not hard at all  . Food insecurity    Worry: Never true     Inability: Never true  . Transportation needs    Medical: No    Non-medical: No  Tobacco Use  . Smoking status: Former Smoker    Packs/day: 1.50    Years: 50.00    Pack years: 75.00    Types: Cigarettes    Quit date: 10/03/1995    Years since quitting: 22.8  . Smokeless tobacco: Never Used  Substance and Sexual Activity  . Alcohol use: Not Currently    Alcohol/week: 1.0 standard drinks    Types: 1 Glasses of wine per week    Comment: per week  . Drug use: No  . Sexual activity: Not Currently  Lifestyle  . Physical activity    Days per week: 0 days    Minutes per session: Not on file  . Stress: Not at all  Relationships  . Social Herbalist on phone: Not on file    Gets together: Not on file    Attends religious service: Not on file    Active member of club or organization: Not on file    Attends meetings of clubs or organizations: Not on file    Relationship status: Not on file  Other Topics Concern  . Not on file  Social History Narrative   Born in [place]. Lives in [place]. Son lives nearby.      No regular exercise     Outpatient Encounter Medications as of 08/04/2018  Medication Sig  . acetaminophen (TYLENOL) 500 MG tablet Take 500 mg by mouth 2 (two) times daily as needed for moderate pain or headache.   . albuterol (PROAIR HFA) 108 (90 Base) MCG/ACT inhaler Inhale 2 puffs into the lungs every 6 (six) hours as needed for wheezing or shortness of breath.  Marland Kitchen amLODipine (NORVASC) 5 MG tablet Take 1 tablet (5 mg total) by mouth daily.  Marland Kitchen atenolol (TENORMIN) 25 MG tablet TAKE 1 TABLET BY MOUTH DAILY  . cholecalciferol 25 MCG (1000 UT) TABS Take 1,000 Units by mouth daily.  . clopidogrel (PLAVIX) 75 MG tablet Take 1 tablet (75 mg total) by mouth daily.  Marland Kitchen docusate sodium (COLACE) 100 MG capsule Take 1 capsule (100 mg total) by mouth 2 (two) times daily.  Marland Kitchen ELIQUIS 2.5 MG TABS tablet TAKE 1 TABLET BY MOUTH TWICE A DAY  . rosuvastatin (CRESTOR) 5 MG tablet Take 1  tablet (5 mg total) by mouth daily. (Patient taking differently: Take 5 mg by mouth every evening. )  . vitamin B-12 (CYANOCOBALAMIN) 1000 MCG tablet Take 1,000 mcg by mouth at bedtime.   No facility-administered encounter medications on file as of 08/04/2018.     Activities of Daily Living In your present state of health, do you have any difficulty performing the following activities: 08/04/2018 01/21/2018  Hearing? N N  Vision?  N N  Difficulty concentrating or making decisions? Y N  Comment Reports difficulty remembering names -  Walking or climbing stairs? N Y  Dressing or bathing? N N  Doing errands, shopping? Y N  Comment She does not drive Facilities manager and eating ? Y -  Comment Daughter does the cooking. Self feeds. -  Using the Toilet? N -  In the past six months, have you accidently leaked urine? N -  Do you have problems with loss of bowel control? N -  Managing your Medications? N -  Managing your Finances? Y -  Comment Son manages -  Housekeeping or managing your Housekeeping? Y -  Comment Maid assist -  Some recent data might be hidden    Patient Care Team: Leone Haven, MD as PCP - General (Family Medicine) Deboraha Sprang, MD (Cardiology) Minna Merritts, MD (Cardiology)    Assessment:   This is a routine wellness examination for [place].  I connected with patient 08/04/18 at 10:30 AM EDT by an audio enabled telemedicine application and verified that I am speaking with the correct person using two identifiers. Patient stated full name and DOB. Patient gave permission to continue with virtual visit. Patient's location was at home and Nurse's location was at [place] office.   Health Screenings  Mammogram - 04/2013 Colonoscopy - 09/2007 Bone Density - Discussed; plans to schedule later.  Glaucoma -none Hearing -demonstrates normal hearing during visit. Dental- UTD Vision- visits within the last 12 months.  Social  Alcohol intake - no      Smoking  history- former    Smokers in home? none Illicit drug use? none Exercise - walking in the home Diet - healthy Sexually Active -not currently BMI- discussed the importance of a healthy diet, water intake and the benefits of aerobic exercise.  Educational material provided.   Safety  Patient feels safe at home- yes Patient does have smoke detectors at home- yes Patient does wear sunscreen or protective clothing when in direct sunlight -yes Patient does wear seat belt when in a moving vehicle -yes Patient drives - no  [id number] precautions and sickness symptoms discussed.   Activities of Daily Living Patient denies needing assistance with: feeding themselves, getting from bed to chair, getting to the toilet, bathing/showering, dressing.No new identified risk were noted.    Depression Screen Patient denies losing interest in daily life, feeling hopeless, or crying easily over simple problems.   Medication-taking as directed and without issues.   Fall Screen Patient denies being afraid of falling or falling in the last year.   Memory Screen Patient is alert.  Patient notes difficulty remembering names.  Correctly identified the president of the [place], season and recall 3/5. Patient likes to complete word search puzzles for brain stimulation.  Immunizations The following Immunizations were discussed: Influenza, shingles, pneumonia, and tetanus.   Other Providers Patient Care Team: Leone Haven, MD as PCP - General (Family Medicine) Deboraha Sprang, MD (Cardiology) Minna Merritts, MD (Cardiology)  Exercise Activities and Dietary recommendations Current Exercise Habits: The patient does not participate in regular exercise at present  Goals      Patient Stated   . Follow up with Primary Care Provider (pt-stated)     Continue to spot check blood pressure. Report readings to your doctor if consistently higher than 140/90.  Currently averaging 135/60-140/70s      Other    . Increase physical activity     Chair/standing exercises  daily while holding onto walker for balance.        Fall Risk Fall Risk  08/04/2018 02/27/2018 01/28/2018 03/06/2017 03/05/2016  Falls in the past year? 0 0 0 No No  Number falls in past yr: - 0 0 - -  Injury with Fall? - 0 0 - -  Follow up - - - - -   Depression Screen PHQ 2/9 Scores 08/04/2018 03/06/2017 03/05/2016 03/04/2015  PHQ - 2 Score 0 0 0 0  PHQ- 9 Score - - - -     Cognitive Function MMSE - Mini Mental State Exam 03/05/2016 03/04/2015  Orientation to time 5 5  Orientation to Place 5 5  Registration 3 3  Attention/ Calculation 5 5  Recall 3 3  Language- name 2 objects 2 2  Language- repeat 1 1  Language- follow 3 step command 3 3  Language- read & follow direction 1 1  Write a sentence 1 1  Copy design 1 1  Total score 30 30     6CIT Screen 08/04/2018  What Year? 0 points  What month? 0 points  What time? 0 points  Count back from 20 0 points  Months in reverse 0 points  Repeat phrase 4 points  Total Score 4    Immunization History  Administered Date(s) Administered  . Influenza Split 12/13/2010, 09/27/2011, 12/13/2013  . Influenza Whole 11/28/2009  . Influenza, High Dose Seasonal PF 11/26/2012  . Influenza, Seasonal, Injecte, Preservative Fre 11/23/2014  . Influenza,inj,Quad PF,6+ Mos 10/16/2016  . Influenza-Unspecified 09/27/2015, 11/13/2017  . Pneumococcal Conjugate-13 04/22/2013  . Pneumococcal Polysaccharide-23 08/27/2006   Screening Tests Health Maintenance  Topic Date Due  . DEXA SCAN  05/04/1996  . TETANUS/TDAP  08/04/2019 (Originally 05/05/1948)  . INFLUENZA VACCINE  08/23/2018  . PNA vac Low Risk Adult  Completed      Plan:    End of life planning; Advance aging; Advanced directives discussed.  Copy of current HCPOA/Living Will on file.    I have personally reviewed and noted the following in the patient's chart:   . Medical and social history . Use of alcohol, tobacco or illicit  drugs  . Current medications and supplements . Functional ability and status . Nutritional status . Physical activity . Advanced directives . List of other physicians . Hospitalizations, surgeries, and ER visits in previous 12 months . Vitals . Screenings to include cognitive, depression, and falls . Referrals and appointments  In addition, I have reviewed and discussed with patient certain preventive protocols, quality metrics, and best practice recommendations. A written personalized care plan for preventive services as well as general preventive health recommendations were provided to patient.     Varney Biles, LPN  04/17/7122

## 2018-08-04 NOTE — Patient Instructions (Addendum)
  Ms. Skorupski , Thank you for taking time to come for your Medicare Wellness Visit. I appreciate your ongoing commitment to your health goals. Please review the following plan we discussed and let me know if I can assist you in the future.   These are the goals we discussed: Goals      Patient Stated   . Follow up with Primary Care Provider (pt-stated)     Continue to spot check blood pressure. Report readings to your doctor if consistently higher than 140/90.  Currently averaging 135/60-140/70s      Other   . Increase physical activity     Chair/standing exercises daily while holding onto walker for balance.        This is a list of the screening recommended for you and due dates:  Health Maintenance  Topic Date Due  . DEXA scan (bone density measurement)  05/04/1996  . Tetanus Vaccine  08/04/2019*  . Flu Shot  08/23/2018  . Pneumonia vaccines  Completed  *Topic was postponed. The date shown is not the original due date.

## 2018-08-10 NOTE — Progress Notes (Signed)
I have reviewed the above note and agree.  Amayrany Cafaro, M.D.  

## 2018-08-13 ENCOUNTER — Telehealth: Payer: Self-pay | Admitting: Family Medicine

## 2018-08-13 NOTE — Telephone Encounter (Signed)
Pt has a sore on her right leg at the vein that is very painful Caregiver stated that they called vein and vascular and they cant see her because its not a renal issues  Pt schedule with Lauren for tomorrow 7/23 @ 9am Caregiver wanted a call back to know what to do between now and her appt

## 2018-08-14 ENCOUNTER — Other Ambulatory Visit: Payer: Self-pay

## 2018-08-14 ENCOUNTER — Ambulatory Visit (INDEPENDENT_AMBULATORY_CARE_PROVIDER_SITE_OTHER): Payer: Medicare Other | Admitting: Family Medicine

## 2018-08-14 DIAGNOSIS — L03115 Cellulitis of right lower limb: Secondary | ICD-10-CM | POA: Diagnosis not present

## 2018-08-14 DIAGNOSIS — L989 Disorder of the skin and subcutaneous tissue, unspecified: Secondary | ICD-10-CM | POA: Diagnosis not present

## 2018-08-14 MED ORDER — CEPHALEXIN 500 MG PO CAPS: 500.0000 mg | ORAL_CAPSULE | Freq: Two times a day (BID) | ORAL | 0 refills | Status: DC

## 2018-08-14 MED ORDER — MUPIROCIN 2 % EX OINT: 1.0000 "application " | TOPICAL_OINTMENT | Freq: Two times a day (BID) | CUTANEOUS | 0 refills | Status: DC

## 2018-08-14 NOTE — Telephone Encounter (Signed)
Being seen by FNP.

## 2018-08-14 NOTE — Progress Notes (Signed)
Patient ID: Jessica [name], female   DOB: 08/20/1929, 85 y.o.   MRN: [id number]    Virtual Visit via video/phone Note  This visit type was conducted due to national recommendations for restrictions regarding the COVID-19 pandemic (e.g. social distancing).  This format is felt to be most appropriate for this patient at this time.  All issues noted in this document were discussed and addressed.  No physical exam was performed (except for noted visual exam findings with Video Visits).   I connected with [name] today at  9:00 AM EDT by a video enabled telemedicine application or telephone and verified that I am speaking with the correct person using two identifiers. Location patient: home Location provider: work or home office Persons participating in the virtual visit: patient, provider  I discussed the limitations, risks, security and privacy concerns of performing an evaluation and management service by telephone and the availability of in person appointments. I also discussed with the patient that there may be a patient responsible charge related to this service. The patient expressed understanding and agreed to proceed.  Interactive audio and video telecommunications were attempted between this provider and patient, however failed, due to patient having technical difficulties -- video connection attempted x3 and failed. Visit finished over phone.  HPI:  Patient, daughter and I connected over telephone after failed video attempts due to sore area on her right leg that appears to be infected.  Patient suspects she bumped her leg on corner of a coffee table or dishwasher.  Daughter states the area of redness is approximately size of a quarter.  It is not open or draining at this time, but she is concerned it potentially could drain.  States the area of redness is slightly raised and warm to touch.  Denies seeing any streaking of red up the leg.  Denies any swelling of lower extremity.  Patient  is anticoagulated on Eliquis.  Patient has no fever or chills.   ROS: See pertinent positives and negatives per HPI.  Past Medical History:  Diagnosis Date  . AAA (abdominal aortic aneurysm) ([place])   . Abdominal aneurysm without mention of rupture   . Atrioventricular block, complete ([place])   . Cardiac pacemaker in situ   . Gout   . Hernia   . Hyperlipidemia   . Hypertension   . Rectal bleeding     Past Surgical History:  Procedure Laterality Date  . ABDOMINAL AORTIC ANEURYSM REPAIR    . HERNIA REPAIR    . ohter     growth on colon surgery  . PPM GENERATOR CHANGEOUT N/A 02/25/2017   Procedure: PPM GENERATOR CHANGEOUT;  Surgeon: Deboraha Sprang, MD;  Location: [facility];  Service: Cardiovascular;  Laterality: N/A;  . RENAL ANGIOGRAPHY Right 12/03/2017   Procedure: RENAL ANGIOGRAPHY;  Surgeon: Katha Cabal, MD;  Location: [facility];  Service: Cardiovascular;  Laterality: Right;  . TONSILLECTOMY      Family History  Problem Relation Age of Onset  . Heart disease Father   . Alcohol abuse Father   . Cancer Brother    Social History   Tobacco Use  . Smoking status: Former Smoker    Packs/day: 1.50    Years: 50.00    Pack years: 75.00    Types: Cigarettes    Quit date: 10/03/1995    Years since quitting: 22.8  . Smokeless tobacco: Never Used  Substance Use Topics  . Alcohol use: Not Currently  Alcohol/week: 1.0 standard drinks    Types: 1 Glasses of wine per week    Comment: per week    Current Outpatient Medications:  .  acetaminophen (TYLENOL) 500 MG tablet, Take 500 mg by mouth 2 (two) times daily as needed for moderate pain or headache. , Disp: , Rfl:  .  albuterol (PROAIR HFA) 108 (90 Base) MCG/ACT inhaler, Inhale 2 puffs into the lungs every 6 (six) hours as needed for wheezing or shortness of breath., Disp: 1 Inhaler, Rfl: 5 .  amLODipine (NORVASC) 5 MG tablet, Take 1 tablet (5 mg total) by mouth daily., Disp: 90 tablet, Rfl: 3 .   atenolol (TENORMIN) 25 MG tablet, TAKE 1 TABLET BY MOUTH DAILY, Disp: 90 tablet, Rfl: 0 .  cholecalciferol 25 MCG (1000 UT) TABS, Take 1,000 Units by mouth daily., Disp: , Rfl:  .  clopidogrel (PLAVIX) 75 MG tablet, Take 1 tablet (75 mg total) by mouth daily., Disp: 90 tablet, Rfl: 3 .  docusate sodium (COLACE) 100 MG capsule, Take 1 capsule (100 mg total) by mouth 2 (two) times daily., Disp: 60 capsule, Rfl: 0 .  ELIQUIS 2.5 MG TABS tablet, TAKE 1 TABLET BY MOUTH TWICE A DAY, Disp: 60 tablet, Rfl: 5 .  rosuvastatin (CRESTOR) 5 MG tablet, Take 1 tablet (5 mg total) by mouth daily. (Patient taking differently: Take 5 mg by mouth every evening. ), Disp: 90 tablet, Rfl: 3 .  vitamin B-12 (CYANOCOBALAMIN) 1000 MCG tablet, Take 1,000 mcg by mouth at bedtime., Disp: , Rfl:   EXAM:  GENERAL: alert, oriented, sounds well and in no acute distress  LUNGS: on inspection no signs of respiratory distress, breathing rate appears normal, no obvious gross SOB, gasping or wheezing  PSYCH/NEURO: pleasant and cooperative, no obvious depression or anxiety, speech and thought processing grossly intact  ASSESSMENT AND PLAN:  Discussed the following assessment and plan:  Skin infection/sore on leg - unable to connect via video however patient and daughter were able to give a good oral description.  Due to redness and warmth of skin we will treat with Keflex twice daily for 7 days and she will also apply topical mupirocin ointment twice daily and monitor area for any worsening redness.  Advised patient and daughter that if redness worsens, notices increased pain, increased heat, streaking of red of skin, swelling to call office right away.   I discussed the assessment and treatment plan with the patient. The patient was provided an opportunity to ask questions and all were answered. The patient agreed with the plan and demonstrated an understanding of the instructions.   The patient was advised to call back or seek  an in-person evaluation if the symptoms worsen or if the condition fails to improve as anticipated.  I provided 15 minutes of non-face-to-face time during this encounter.   Jodelle Green, FNP

## 2018-08-23 ENCOUNTER — Other Ambulatory Visit: Payer: Self-pay | Admitting: Cardiovascular Disease

## 2018-09-02 ENCOUNTER — Ambulatory Visit (INDEPENDENT_AMBULATORY_CARE_PROVIDER_SITE_OTHER): Payer: Medicare Other

## 2018-09-02 ENCOUNTER — Encounter (INDEPENDENT_AMBULATORY_CARE_PROVIDER_SITE_OTHER): Payer: Self-pay | Admitting: Nurse Practitioner

## 2018-09-02 ENCOUNTER — Other Ambulatory Visit: Payer: Self-pay

## 2018-09-02 ENCOUNTER — Ambulatory Visit (INDEPENDENT_AMBULATORY_CARE_PROVIDER_SITE_OTHER): Payer: Medicare Other | Admitting: Nurse Practitioner

## 2018-09-02 VITALS — BP 166/84 | HR 70 | Resp 16 | Wt 123.0 lb

## 2018-09-02 DIAGNOSIS — J438 Other emphysema: Secondary | ICD-10-CM | POA: Diagnosis not present

## 2018-09-02 DIAGNOSIS — I701 Atherosclerosis of renal artery: Secondary | ICD-10-CM

## 2018-09-02 DIAGNOSIS — E782 Mixed hyperlipidemia: Secondary | ICD-10-CM

## 2018-09-02 DIAGNOSIS — I70309 Unspecified atherosclerosis of unspecified type of bypass graft(s) of the extremities, unspecified extremity: Secondary | ICD-10-CM | POA: Diagnosis not present

## 2018-09-02 DIAGNOSIS — I10 Essential (primary) hypertension: Secondary | ICD-10-CM

## 2018-09-02 NOTE — Progress Notes (Signed)
SUBJECTIVE:  Patient ID: Jessica [name], female    DOB: Jan 05, 1930, 85 y.o.   MRN: [id number] Chief Complaint  Patient presents with  . Follow-up    ultrasound follow up    HPI  Jessica [name] is a 85 y.o. female that is following up with a known history of renal artery stenosis.  The patient denies any fluctuations with her blood pressure recently.  She is normally in the systolic 889V.  She denies any fever, chills, nausea, vomiting or diarrhea.  She denies any headache or flushing.  She denies any flank or unusual back pain.  Patient has previous history of an aortobiiliac graft.  She denies any significant changes to her overall health care.  She denies any interval shortening of her walking distance or new symptoms consistent with claudication.  She denies any rest pain symptoms.  She denies any lower extremity ulcerations.  The patient denies any amaurosis fugax or recent TIA symptoms.  There is no recent neurological changes noted.  Patient denies any DVT or thrombophlebitis.  Duplex ultrasound of the right renal artery shows no significant stenosis.  The left no flow which is known.  No major changes from previous study done on 03/05/2018.   Past Medical History:  Diagnosis Date  . AAA (abdominal aortic aneurysm) ([place])   . Abdominal aneurysm without mention of rupture   . Atrioventricular block, complete ([place])   . Cardiac pacemaker in situ   . Gout   . Hernia   . Hyperlipidemia   . Hypertension   . Rectal bleeding     Past Surgical History:  Procedure Laterality Date  . ABDOMINAL AORTIC ANEURYSM REPAIR    . HERNIA REPAIR    . ohter     growth on colon surgery  . PPM GENERATOR CHANGEOUT N/A 02/25/2017   Procedure: PPM GENERATOR CHANGEOUT;  Surgeon: Deboraha Sprang, MD;  Location: [facility];  Service: Cardiovascular;  Laterality: N/A;  . RENAL ANGIOGRAPHY Right 12/03/2017   Procedure: RENAL ANGIOGRAPHY;  Surgeon: Katha Cabal, MD;  Location: [facility];  Service: Cardiovascular;  Laterality: Right;  . TONSILLECTOMY      Social History   Socioeconomic History  . Marital status: Widowed    Spouse name: Not on file  . Number of children: 2  . Years of education: Not on file  . Highest education level: Not on file  Occupational History  . Occupation: Retired    Fish farm manager: RETIRED  Social Needs  . Financial resource strain: Not hard at all  . Food insecurity    Worry: Never true    Inability: Never true  . Transportation needs    Medical: No    Non-medical: No  Tobacco Use  . Smoking status: Former Smoker    Packs/day: 1.50    Years: 50.00    Pack years: 75.00    Types: Cigarettes    Quit date: 10/03/1995    Years since quitting: 22.9  . Smokeless tobacco: Never Used  Substance and Sexual Activity  . Alcohol use: Not Currently    Alcohol/week: 1.0 standard drinks    Types: 1 Glasses of wine per week    Comment: per week  . Drug use: No  . Sexual activity: Not Currently  Lifestyle  . Physical activity    Days per week: 0 days    Minutes per session: Not on file  . Stress: Not at all  Relationships  . Social connections  Talks on phone: Not on file    Gets together: Not on file    Attends religious service: Not on file    Active member of club or organization: Not on file    Attends meetings of clubs or organizations: Not on file    Relationship status: Not on file  . Intimate partner violence    Fear of current or ex partner: Not on file    Emotionally abused: Not on file    Physically abused: Not on file    Forced sexual activity: Not on file  Other Topics Concern  . Not on file  Social History Narrative   Born in [place]. Lives in [place]. Son lives nearby.      No regular exercise     Family History  Problem Relation Age of Onset  . Heart disease Father   . Alcohol abuse Father   . Cancer Brother     Allergies  Allergen Reactions  . Codeine Other (See Comments)    Made her feel crazy  .  Morphine Other (See Comments)    Made her feel crazy  . Zyrtec [Cetirizine] Other (See Comments)    Causes facial numbness     Review of Systems   Review of Systems: Negative Unless Checked Constitutional: [] Weight loss  [] Fever  [] Chills Cardiac: [] Chest pain   []  Atrial Fibrillation  [] Palpitations   [] Shortness of breath when laying flat   [] Shortness of breath with exertion. [] Shortness of breath at rest Vascular:  [] Pain in legs with walking   [] Pain in legs with standing [] Pain in legs when laying flat   [] Claudication    [] Pain in feet when laying flat    [] History of DVT   [] Phlebitis   [] Swelling in legs   [x] Varicose veins   [] Non-healing ulcers Pulmonary:   [] Uses home oxygen   [] Productive cough   [] Hemoptysis   [] Wheeze  [] COPD   [] Asthma Neurologic:  [] Dizziness   [] Seizures  [] Blackouts [] History of stroke   [] History of TIA  [] Aphasia   [] Temporary Blindness   [] Weakness or numbness in arm   [x] Weakness or numbness in leg Musculoskeletal:   [] Joint swelling   [] Joint pain   [] Low back pain  []  History of Knee Replacement [] Arthritis [] back Surgeries  []  Spinal Stenosis    Hematologic:  [] Easy bruising  [] Easy bleeding   [] Hypercoagulable state   [] Anemic Gastrointestinal:  [] Diarrhea   [] Vomiting  [] Gastroesophageal reflux/heartburn   [] Difficulty swallowing. [] Abdominal pain Genitourinary:  [x] Chronic kidney disease   [] Difficult urination  [] Anuric   [] Blood in urine [] Frequent urination  [] Burning with urination   [] Hematuria Skin:  [] Rashes   [] Ulcers [] Wounds Psychological:  [] History of anxiety   []  History of major depression  []  Memory Difficulties      OBJECTIVE:   Physical Exam  BP (!) 166/84 (BP Location: Right Arm)   Pulse 70   Resp 16   Wt 123 lb (55.8 kg)   BMI 20.47 kg/m   Gen: WD/WN, NAD Head: [place]/AT, No temporalis wasting.  Ear/Nose/Throat: Hearing grossly intact, nares w/o erythema or drainage Eyes: PER, EOMI, sclera nonicteric.  Neck: Supple, no  masses.  No JVD.  Pulmonary:  Good air movement, no use of accessory muscles.  Cardiac: RRR Vascular:  Vessel Right Left  Radial Palpable Palpable  Dorsalis Pedis Palpable Palpable  Posterior Tibial Palpable Palpable   Gastrointestinal: soft, non-distended. No guarding/no peritoneal signs.  Musculoskeletal: Uses walker for ambulation No deformity or atrophy.  Neurologic: Pain and light touch intact in extremities.  Symmetrical.  Speech is fluent. Motor exam as listed above. Psychiatric: Judgment intact, Mood & affect appropriate for pt's clinical situation. Dermatologic: No Venous rashes. No Ulcers Noted.  No changes consistent with cellulitis. Lymph : No Cervical lymphadenopathy, no lichenification or skin changes of chronic lymphedema.       ASSESSMENT AND PLAN:  1. Atherosclerosis of aorto-iliac bypass graft (HCC)  Recommend:  The patient has evidence of atherosclerosis of the lower extremities with claudication.  The patient does not voice lifestyle limiting changes at this point in time.  Noninvasive studies do not suggest clinically significant change.  No invasive studies, angiography or surgery at this time The patient should continue walking and begin a more formal exercise program.  The patient should continue antiplatelet therapy and aggressive treatment of the lipid abnormalities  No changes in the patient's medications at this time  The patient should continue wearing graduated compression socks 10-15 mmHg strength to control the mild edema.   We will have the patient return in 6 months with ABIs - VAS [place] ABI WITH/WO TBI; Future  2. Renal artery stenosis (HCC) Recommend:  The patient has evidence of atherosclerotic changes of the renal artery.  At this time the patient's blood pressure is fairly well controlled.  Patient does not need angiography of the renal artery given the good control of his hypertension.    However, if at any point the patient's BP  becomes acutely worse then intervention would be strongly encouraged.  The patient voices understanding of this plan and agrees.   We will have the patient return in 6 months   - VAS US RENAL ARTERY DUPLEX; Future  3. Other emphysema ([place]) Continue pulmonary medications and aerosols as already ordered, these medications have been reviewed and there are no changes at this time.   4. Mixed hyperlipidemia Continue statin as ordered and reviewed, no changes at this time   5. HYPERTENSION, BENIGN Continue antihypertensive medications as already ordered, these medications have been reviewed and there are no changes at this time.    Current Outpatient Medications on File Prior to Visit  Medication Sig Dispense Refill  . acetaminophen (TYLENOL) 500 MG tablet Take 500 mg by mouth 2 (two) times daily as needed for moderate pain or headache.     . albuterol (PROAIR HFA) 108 (90 Base) MCG/ACT inhaler Inhale 2 puffs into the lungs every 6 (six) hours as needed for wheezing or shortness of breath. 1 Inhaler 5  . amLODipine (NORVASC) 5 MG tablet Take 1 tablet (5 mg total) by mouth daily. 90 tablet 3  . atenolol (TENORMIN) 25 MG tablet TAKE 1 TABLET BY MOUTH DAILY 90 tablet 0  . cholecalciferol 25 MCG (1000 UT) TABS Take 1,000 Units by mouth daily.    . clopidogrel (PLAVIX) 75 MG tablet Take 1 tablet (75 mg total) by mouth daily. 90 tablet 3  . docusate sodium (COLACE) 100 MG capsule Take 1 capsule (100 mg total) by mouth 2 (two) times daily. 60 capsule 0  . ELIQUIS 2.5 MG TABS tablet TAKE 1 TABLET BY MOUTH TWICE A DAY 60 tablet 5  . mupirocin ointment (BACTROBAN) 2 % Apply 1 application topically 2 (two) times daily. Use thin layer of ointment over affected area on right leg 22 g 0  . rosuvastatin (CRESTOR) 5 MG tablet TAKE 1 TABLET BY MOUTH DAILY USUALLY IN THE EVENING 90 tablet 3  . vitamin B-12 (CYANOCOBALAMIN) 1000 MCG tablet  Take 1,000 mcg by mouth at bedtime.    . cephALEXin (KEFLEX) 500 MG  capsule Take 1 capsule (500 mg total) by mouth 2 (two) times daily. (Patient not taking: Reported on 09/02/2018) 14 capsule 0   No current facility-administered medications on file prior to visit.     There are no Patient Instructions on file for this visit. No follow-ups on file.   Kris Hartmann, NP  This note was completed with Sales executive.  Any errors are purely unintentional.

## 2018-09-30 ENCOUNTER — Ambulatory Visit (INDEPENDENT_AMBULATORY_CARE_PROVIDER_SITE_OTHER): Payer: Medicare Other | Admitting: *Deleted

## 2018-09-30 DIAGNOSIS — I442 Atrioventricular block, complete: Secondary | ICD-10-CM

## 2018-09-30 LAB — CUP PACEART REMOTE DEVICE CHECK
Battery Remaining Longevity: 115 mo
Battery Remaining Percentage: 95.5 %
Battery Voltage: 2.99 V
Brady Statistic AP VP Percent: 99 %
Brady Statistic AP VS Percent: 1 %
Brady Statistic AS VP Percent: 1 %
Brady Statistic AS VS Percent: 1 %
Brady Statistic RA Percent Paced: 37 %
Brady Statistic RV Percent Paced: 99 %
Date Time Interrogation Session: 20200908092939
Implantable Lead Implant Date: 20090610
Implantable Lead Implant Date: 20090610
Implantable Lead Location: 753859
Implantable Lead Location: 753860
Implantable Lead Model: 5076
Implantable Pulse Generator Implant Date: 20190204
Lead Channel Impedance Value: 390 Ohm
Lead Channel Impedance Value: 440 Ohm
Lead Channel Pacing Threshold Amplitude: 0.75 V
Lead Channel Pacing Threshold Amplitude: 0.75 V
Lead Channel Pacing Threshold Pulse Width: 0.5 ms
Lead Channel Pacing Threshold Pulse Width: 0.5 ms
Lead Channel Sensing Intrinsic Amplitude: 0.6 mV
Lead Channel Sensing Intrinsic Amplitude: 3.8 mV
Lead Channel Setting Pacing Amplitude: 1 V
Lead Channel Setting Pacing Amplitude: 2 V
Lead Channel Setting Pacing Pulse Width: 0.5 ms
Lead Channel Setting Sensing Sensitivity: 2 mV
Pulse Gen Model: 2272
Pulse Gen Serial Number: 8991929

## 2018-10-08 ENCOUNTER — Encounter: Payer: Self-pay | Admitting: Family Medicine

## 2018-10-08 ENCOUNTER — Ambulatory Visit (INDEPENDENT_AMBULATORY_CARE_PROVIDER_SITE_OTHER): Payer: Medicare Other | Admitting: Family Medicine

## 2018-10-08 ENCOUNTER — Other Ambulatory Visit: Payer: Self-pay

## 2018-10-08 VITALS — BP 158/80 | HR 77 | Temp 96.6°F | Resp 18 | Ht 64.0 in | Wt 124.6 lb

## 2018-10-08 DIAGNOSIS — F329 Major depressive disorder, single episode, unspecified: Secondary | ICD-10-CM

## 2018-10-08 DIAGNOSIS — F32A Depression, unspecified: Secondary | ICD-10-CM

## 2018-10-08 DIAGNOSIS — Z23 Encounter for immunization: Secondary | ICD-10-CM

## 2018-10-08 DIAGNOSIS — G4710 Hypersomnia, unspecified: Secondary | ICD-10-CM | POA: Diagnosis not present

## 2018-10-08 LAB — B12 AND FOLATE PANEL
Folate: >24.7 ng/mL (ref >5.9)
Vitamin B-12: 709 pg/mL (ref 211–911)

## 2018-10-08 LAB — IBC + FERRITIN
Ferritin: 92.4 ng/mL (ref 10.0–291.0)
Iron: 43 ug/dL (ref 42–145)
Saturation Ratios: 13.4 % — ABNORMAL LOW (ref 20.0–50.0)
Transferrin: 229 mg/dL (ref 212.0–360.0)

## 2018-10-08 LAB — TSH: TSH: 0.97 u[IU]/mL (ref 0.35–4.50)

## 2018-10-08 LAB — BASIC METABOLIC PANEL
BUN: 17 mg/dL (ref 6–23)
CO2: 33 mEq/L — ABNORMAL HIGH (ref 19–32)
Calcium: 10.5 mg/dL (ref 8.4–10.5)
Chloride: 101 mEq/L (ref 96–112)
Creatinine, Ser: 1.38 mg/dL — ABNORMAL HIGH (ref 0.40–1.20)
GFR: 36.13 mL/min — ABNORMAL LOW (ref >60.00)
Glucose, Bld: 92 mg/dL (ref 70–99)
Potassium: 4 mEq/L (ref 3.5–5.1)
Sodium: 142 mEq/L (ref 135–145)

## 2018-10-08 LAB — VITAMIN D 25 HYDROXY (VIT D DEFICIENCY, FRACTURES): VITD: 57.42 ng/mL (ref 30.00–100.00)

## 2018-10-08 LAB — CBC
HCT: 41.2 % (ref 36.0–46.0)
Hemoglobin: 13.3 g/dL (ref 12.0–15.0)
MCHC: 32.4 g/dL (ref 30.0–36.0)
MCV: 91.6 fl (ref 78.0–100.0)
Platelets: 173 10*3/uL (ref 150.0–400.0)
RBC: 4.5 Mil/uL (ref 3.87–5.11)
RDW: 15.4 % (ref 11.5–15.5)
WBC: 8.8 10*3/uL (ref 4.0–10.5)

## 2018-10-08 MED ORDER — CITALOPRAM HYDROBROMIDE 10 MG PO TABS: 10.0000 mg | ORAL_TABLET | Freq: Every day | ORAL | 2 refills | Status: DC

## 2018-10-08 NOTE — Progress Notes (Signed)
Subjective:    Patient ID: Jessica [name], female    DOB: 02/24/1929, 85 y.o.   MRN: [id number]  HPI   Patient presents to clinic with family member due to sleeping a lot and feeling down and sad.  Patient has gone through a lot of changes recently, she is no longer driving and no longer lives on her own.  These changes have been tough for her, feels a loss of independence.  Family has done a lot to help her have improved mood, they try to get together and do family events where they do puzzles, have a cookout or have other meals together.  Patient does enjoy these gatherings with family.  She has not been going to church due to the pandemic and not wanting to be around large groups of people.  Does at times "wish forgot to take her in her sleep" due to not having as much independence and her older age.  Denies any plan for hurting herself or hurting others.   Patient Active Problem List   Diagnosis Date Noted  . Bleeding hemorrhoids 02/27/2018  . Weight loss 08/30/2017  . Low back pain 08/30/2017  . Depression, major, single episode, mild ([place]) 05/20/2017  . Fall 05/20/2017  . Hypoglycemia 04/04/2017  . Gout 06/05/2016  . AAA (abdominal aortic aneurysm) without rupture ([place]) 04/18/2016  . Loss of sense of smell 12/30/2015  . Memory difficulties 12/30/2015  . Injury of right lower extremity and cellulitis 11/29/2014  . Renal artery aneurysm ([place]) 10/26/2014  . Renal artery stenosis ([place]) 09/15/2014  . Renal arterial aneurysm ([place]) 09/15/2014  . CN (constipation) 09/06/2014  . Change in stool caliber 09/06/2014  . Atherosclerosis of aorto-iliac bypass graft ([place]) 08/17/2014  . Shortness of breath 05/12/2014  . Conjunctival hemorrhage of right eye 08/13/2013  . Medicare annual wellness visit, subsequent 04/22/2013  . Allergic rhinitis 11/26/2012  . COPD (chronic obstructive pulmonary disease) ([place]) 09/27/2011  . Hyperlipidemia 02/22/2011  . Long term current use of anticoagulant  04/12/2010  . HYPERTENSION, BENIGN 08/25/2009  . Atrial fibrillation ([place]) 03/07/2009  . Carotid stenosis 08/24/2008  . AV BLOCK, COMPLETE 06/17/2008  . Aneurysm of abdominal vessel ([place]) 06/17/2008  . PACEMAKER-[place] 06/17/2008   Social History   Tobacco Use  . Smoking status: Former Smoker    Packs/day: 1.50    Years: 50.00    Pack years: 75.00    Types: Cigarettes    Quit date: 10/03/1995    Years since quitting: 23.0  . Smokeless tobacco: Never Used  Substance Use Topics  . Alcohol use: Not Currently    Alcohol/week: 1.0 standard drinks    Types: 1 Glasses of wine per week    Comment: per week    Review of Systems  Constitutional: Negative for chills, and fever. +sleeping a lot HENT: Negative for congestion, ear pain, sinus pain and sore throat.   Eyes: Negative.   Respiratory: Negative for cough, shortness of breath and wheezing.   Cardiovascular: Negative for chest pain, palpitations and leg swelling.  Gastrointestinal: Negative for abdominal pain, diarrhea, nausea and vomiting.  Genitourinary: Negative for dysuria, frequency and urgency.  Musculoskeletal: Negative for arthralgias and myalgias.  Skin: Negative for color change, pallor and rash.  Neurological: Negative for syncope, light-headedness and headaches.  Psychiatric/Behavioral: Son worries about depression due to sleeping a lot; patient feeling down, lots of changes in her life recently      Objective:   Physical Exam Vitals signs and nursing note reviewed.  Constitutional:      General: She is not in acute distress.    Appearance: She is not ill-appearing, toxic-appearing or diaphoretic.  HENT:     Head: Normocephalic and atraumatic.  Cardiovascular:     Rate and Rhythm: Normal rate and regular rhythm.     Heart sounds: Normal heart sounds.  Pulmonary:     Effort: Pulmonary effort is normal. No respiratory distress.     Breath sounds: Normal breath sounds.  Neurological:     Mental Status: She  is alert and oriented to person, place, and time.  Psychiatric:        Attention and Perception: Attention normal.        Mood and Affect: Mood is depressed.        Behavior: Behavior is cooperative.        Thought Content: Thought content normal.        Judgment: Judgment normal.    Today's Vitals   10/08/18 1052  BP: (!) 158/80  Pulse: 77  Resp: 18  Temp: (!) 96.6 F (35.9 C)  TempSrc: Temporal  SpO2: 94%  Weight: 124 lb 9.6 oz (56.5 kg)  Height: 5\' 4"  (1.626 m)   Body mass index is 21.39 kg/m. Depression screen [facility] 2/9 10/08/2018 08/14/2018 08/04/2018 03/06/2017 03/05/2016  Decreased Interest 0 0 0 0 0  Down, Depressed, Hopeless 3 0 0 0 0  PHQ - 2 Score 3 0 0 0 0  Altered sleeping 3 0 - - -  Tired, decreased energy 3 0 - - -  Change in appetite 0 0 - - -  Feeling bad or failure about yourself  3 0 - - -  Trouble concentrating 0 0 - - -  Moving slowly or fidgety/restless 0 0 - - -  Suicidal thoughts 3 0 - - -  PHQ-9 Score 15 0 - - -  Difficult doing work/chores Very difficult Not difficult at all - - -  Some recent data might be hidden       Assessment & Plan:    1. Sleeping excessive We will get lab work to rule out any other possible contributing factor to excessive sleeping, but I do suspect depression is a big factor.  - CBC - Basic metabolic panel - TSH - VITAMIN D 25 Hydroxy (Vit-D Deficiency, Fractures) - B12 and Folate Panel - IBC + Ferritin  2. Depressive disorder Patient is interested in starting medication to hopefully improve her mood.  Discussed our options and we decided to do Celexa 10 mg/day.  Patient is aware that this medication does take about 4 to 6 weeks to reach peak effect.  - citalopram (CELEXA) 10 MG tablet; Take 1 tablet (10 mg total) by mouth daily.  Dispense: 30 tablet; Refill: 2  3. Need for immunization against influenza  - Flu Vaccine QUAD High Dose(Fluad)   Patient will follow-up in office in about 4 weeks for recheck on mood  after starting Celexa.  Patient and family member is aware they can call office anytime with questions or concerns.

## 2018-10-14 ENCOUNTER — Ambulatory Visit (INDEPENDENT_AMBULATORY_CARE_PROVIDER_SITE_OTHER): Payer: Medicare Other | Admitting: Family Medicine

## 2018-10-14 ENCOUNTER — Ambulatory Visit: Payer: Self-pay

## 2018-10-14 ENCOUNTER — Other Ambulatory Visit: Payer: Self-pay

## 2018-10-14 DIAGNOSIS — R42 Dizziness and giddiness: Secondary | ICD-10-CM | POA: Diagnosis not present

## 2018-10-14 DIAGNOSIS — F329 Major depressive disorder, single episode, unspecified: Secondary | ICD-10-CM | POA: Diagnosis not present

## 2018-10-14 DIAGNOSIS — F32A Depression, unspecified: Secondary | ICD-10-CM

## 2018-10-14 NOTE — Progress Notes (Signed)
Patient ID: Jessica [name], female   DOB: [birth year], 85 y.o.   MRN: [id number]    Virtual Visit via phone Note  This visit type was conducted due to national recommendations for restrictions regarding the COVID-19 pandemic (e.g. social distancing).  This format is felt to be most appropriate for this patient at this time.  All issues noted in this document were discussed and addressed.  No physical exam was performed (except for noted visual exam findings with Video Visits).   I connected with [name] today at  4:00 PM EDT by telephone and verified that I am speaking with the correct person using two identifiers. Location patient: home Location provider: work or home office Persons participating in the virtual visit: patient, provider  I discussed the limitations, risks, security and privacy concerns of performing an evaluation and management service by telephone and the availability of in person appointments. I also discussed with the patient that there may be a patient responsible charge related to this service. The patient expressed understanding and agreed to proceed.  HPI:  Patient and I connected via telephone to discuss episode of dizziness that occurred a couple of days ago, lasted for about 1 minute then resolved.  Patient states her daughter thinks is because she had not eaten enough that day.  Patient also admits she probably does not drink enough water.  Has been trying to work on her water intake and increasing her food intake.  Usually in the morning will have coffee and toast.  For lunch will have different things like a sandwich or soup and usually her daughter cooks a very good dinner with a salad and some form of meat and side.  Patient does not regularly drink boost or Ensure shakes, but states she would be open to the idea of adding these in for nutrition.  Denies passing out.  Denies feeling sharp pain in head.  Denies any visual changes.  Denies weakness of extremities or  numbness in face or weakness of face.  Denies speech difficulty.  Denies drooling.  Denies loss of visual field, floaters in visual field.  We recently started new medication of Celexa to help mood.  Patient wonders if maybe the dizziness could have been related to this new medicine, but she would like to continue this medicine as she is very hopeful it will help her mood improve. Denies SI or HI.    ROS: See pertinent positives and negatives per HPI.  Past Medical History:  Diagnosis Date   AAA (abdominal aortic aneurysm) ([place])    Abdominal aneurysm without mention of rupture    Atrioventricular block, complete ([place])    Cardiac pacemaker in situ    Gout    Hernia    Hyperlipidemia    Hypertension    Rectal bleeding     Past Surgical History:  Procedure Laterality Date   ABDOMINAL AORTIC ANEURYSM REPAIR     HERNIA REPAIR     ohter     growth on colon surgery   PPM GENERATOR CHANGEOUT N/A 02/25/2017   Procedure: PPM GENERATOR CHANGEOUT;  Surgeon: Deboraha Sprang, MD;  Location: [facility];  Service: Cardiovascular;  Laterality: N/A;   RENAL ANGIOGRAPHY Right 12/03/2017   Procedure: RENAL ANGIOGRAPHY;  Surgeon: Katha Cabal, MD;  Location: [facility];  Service: Cardiovascular;  Laterality: Right;   TONSILLECTOMY      Family History  Problem Relation Age of Onset   Heart disease Father  Alcohol abuse Father    Cancer Brother    Social History   Tobacco Use   Smoking status: Former Smoker    Packs/day: 1.50    Years: 50.00    Pack years: 75.00    Types: Cigarettes    Quit date: 10/03/1995    Years since quitting: 23.0   Smokeless tobacco: Never Used  Substance Use Topics   Alcohol use: Not Currently    Alcohol/week: 1.0 standard drinks    Types: 1 Glasses of wine per week    Comment: per week    Current Outpatient Medications:    acetaminophen (TYLENOL) 500 MG tablet, Take 500 mg by mouth 2 (two) times daily as needed  for moderate pain or headache. , Disp: , Rfl:    albuterol (PROAIR HFA) 108 (90 Base) MCG/ACT inhaler, Inhale 2 puffs into the lungs every 6 (six) hours as needed for wheezing or shortness of breath., Disp: 1 Inhaler, Rfl: 5   amLODipine (NORVASC) 5 MG tablet, Take 1 tablet (5 mg total) by mouth daily., Disp: 90 tablet, Rfl: 3   atenolol (TENORMIN) 25 MG tablet, TAKE 1 TABLET BY MOUTH DAILY, Disp: 90 tablet, Rfl: 0   cholecalciferol 25 MCG (1000 UT) TABS, Take 1,000 Units by mouth daily., Disp: , Rfl:    citalopram (CELEXA) 10 MG tablet, Take 1 tablet (10 mg total) by mouth daily., Disp: 30 tablet, Rfl: 2   clopidogrel (PLAVIX) 75 MG tablet, Take 1 tablet (75 mg total) by mouth daily., Disp: 90 tablet, Rfl: 3   docusate sodium (COLACE) 100 MG capsule, Take 1 capsule (100 mg total) by mouth 2 (two) times daily., Disp: 60 capsule, Rfl: 0   ELIQUIS 2.5 MG TABS tablet, TAKE 1 TABLET BY MOUTH TWICE A DAY, Disp: 60 tablet, Rfl: 5   mupirocin ointment (BACTROBAN) 2 %, Apply 1 application topically 2 (two) times daily. Use thin layer of ointment over affected area on right leg, Disp: 22 g, Rfl: 0   rosuvastatin (CRESTOR) 5 MG tablet, TAKE 1 TABLET BY MOUTH DAILY USUALLY IN THE EVENING, Disp: 90 tablet, Rfl: 3   vitamin B-12 (CYANOCOBALAMIN) 1000 MCG tablet, Take 1,000 mcg by mouth at bedtime., Disp: , Rfl:   EXAM:  GENERAL: alert, oriented, sounds well and in no acute distress  NECK: normal movements of the head and neck  LUNGS: Speaking in complete sentences, no signs of respiratory distress, breathing rate appears normal, no obvious gross SOB, gasping or wheezing  PSYCH/NEURO: pleasant and cooperative, no obvious depression or anxiety, speech and thought processing grossly intact. Can clearly express thoughts.   ASSESSMENT AND PLAN:  Discussed the following assessment and plan:  Episode of dizziness-potentially could be related to not eating or drinking enough at that particular time.   Discussed eating breakfast, lunch and dinner and small snacks in between.  Also discussed keeping up good water intake, suggested strategies like drinking a glass of water with each meal and having a glass of water near her at all times that she can sip throughout the day.  Also discussed adding in a boost or Ensure shake at least 1/day or as a meal replacement if not feeling hungry enough for a full meal.  Lab work done at last week's visit reviewed again with patient, lab work is all acceptable and stable.  Depressive disorder-patient is hopeful for the medication Celexa started last week to help improve her mood.  She has good support from family and friends and enjoys spending time  with her family.  Has gone through a lot of big life changes recently due to no longer driving and no longer living alone, but thinks the medication in combination with support from her family will help her get through.   I discussed the assessment and treatment plan with the patient. The patient was provided an opportunity to ask questions and all were answered. The patient agreed with the plan and demonstrated an understanding of the instructions.   The patient was advised to call back or seek an in-person evaluation if the symptoms worsen or if the condition fails to improve as anticipated.  I provided 15 minutes of non-face-to-face time during this encounter.   Jodelle Green, FNP

## 2018-10-14 NOTE — Telephone Encounter (Signed)
Patient was seen by FNP.

## 2018-10-14 NOTE — Telephone Encounter (Signed)
Patient called stating that she has been dizzy all day.  She states that it is bad enough that she is afraid to stand because she feels she will fall.  She states that last week she started Celexa 10/08/2018. She states the dizziness came after the start of Celexa. She has no other symptoms. She deny's weakness or numbness on one side of her body.  She states she is no alone in the home. Care advice read to patient.  She verbalized understanding.  Call was transferred to office for scheduling. Reason for Disposition . [1] MODERATE dizziness (e.g., interferes with normal activities) AND [2] has NOT been evaluated by physician for this  (Exception: dizziness caused by heat exposure, sudden standing, or poor fluid intake)  Answer Assessment - Initial Assessment Questions 1. DESCRIPTION: "Describe your dizziness."     lightheased 2. LIGHTHEADED: "Do you feel lightheaded?" (e.g., somewhat faint, woozy, weak upon standing)    Fear of walking 3. VERTIGO: "Do you feel like either you or the room is spinning or tilting?" (i.e. vertigo)   no 4. SEVERITY: "How bad is it?"  "Do you feel like you are going to faint?" "Can you stand and walk?"   - MILD - walking normally   - MODERATE - interferes with normal activities (e.g., work, school)    - SEVERE - unable to stand, requires support to walk, feels like passing out now.     Inter fears with balance 5. ONSET:  "When did the dizziness begin?"     Friday  6. AGGRAVATING FACTORS: "Does anything make it worse?" (e.g., standing, change in head position)     standing 7. HEART RATE: "Can you tell me your heart rate?" "How many beats in 15 seconds?"  (Note: not all patients can do this)       no 8. CAUSE: "What do you think is causing the dizziness?"   Celexa 9. RECURRENT SYMPTOM: "Have you had dizziness before?" If so, ask: "When was the last time?" "What happened that time?"    Never 10. OTHER SYMPTOMS: "Do you have any other symptoms?" (e.g., fever, chest  pain, vomiting, diarrhea, bleeding)      no 11. PREGNANCY: "Is there any chance you are pregnant?" "When was your last menstrual period?"      No  Protocols used: DIZZINESS [facility]

## 2018-10-15 ENCOUNTER — Encounter: Payer: Self-pay | Admitting: Cardiology

## 2018-10-15 NOTE — Progress Notes (Signed)
Remote pacemaker transmission.   

## 2018-10-17 ENCOUNTER — Encounter: Payer: Self-pay | Admitting: Lab

## 2018-10-31 ENCOUNTER — Other Ambulatory Visit: Payer: Self-pay | Admitting: Cardiovascular Disease

## 2018-10-31 DIAGNOSIS — I442 Atrioventricular block, complete: Secondary | ICD-10-CM

## 2018-10-31 DIAGNOSIS — I4891 Unspecified atrial fibrillation: Secondary | ICD-10-CM

## 2018-10-31 DIAGNOSIS — I10 Essential (primary) hypertension: Secondary | ICD-10-CM

## 2018-11-05 ENCOUNTER — Telehealth: Payer: Self-pay | Admitting: Cardiovascular Disease

## 2018-11-05 ENCOUNTER — Encounter: Payer: Self-pay | Admitting: Cardiovascular Disease

## 2018-11-05 NOTE — Telephone Encounter (Signed)
-----   Message from Anselm Pancoast, [place] sent at 10/31/2018  1:08 PM EDT ----- Please contact patient for a follow up with Dr. Rockey Situ. Thanks, Ivin Booty

## 2018-11-05 NOTE — Telephone Encounter (Signed)
lmov to schedule  °

## 2018-11-12 ENCOUNTER — Other Ambulatory Visit: Payer: Self-pay

## 2018-11-12 ENCOUNTER — Encounter: Payer: Self-pay | Admitting: Emergency Medicine

## 2018-11-12 ENCOUNTER — Ambulatory Visit
Admission: EM | Admit: 2018-11-12 | Discharge: 2018-11-12 | Disposition: A | Discharge status: Home / Self Care | Payer: Medicare Other | Attending: Family Medicine | Admitting: Family Medicine

## 2018-11-12 DIAGNOSIS — W548XXA Other contact with dog, initial encounter: Secondary | ICD-10-CM | POA: Diagnosis not present

## 2018-11-12 DIAGNOSIS — S6991XA Unspecified injury of right wrist, hand and finger(s), initial encounter: Secondary | ICD-10-CM | POA: Diagnosis not present

## 2018-11-12 MED ORDER — MUPIROCIN 2 % EX OINT: 1.0000 "application " | TOPICAL_OINTMENT | Freq: Two times a day (BID) | CUTANEOUS | 0 refills | Status: AC

## 2018-11-12 NOTE — ED Triage Notes (Signed)
Patient in today with a right hand injury that occurred on Sunday 11/09/18. Patient states a puppy that her son was puppy sitting scratched her hand and is now swollen and painful.

## 2018-11-12 NOTE — ED Provider Notes (Signed)
[facility]    CSN: [id number] Arrival date & time: 11/12/18  1827  History   Chief Complaint Chief Complaint  Patient presents with  . Hand Injury    APPT DOI 11/09/18   HPI  85 year old female presents with the above complaint.  Patient reports that she was scratched by a puppy 3 days ago on 10/18.  She has an open wound with eschar on the dorsum of her right hand.  She has extensive bruising.  She reports mild pain.  Denies surrounding redness.  Denies drainage.  She is concerned that this area may be infected.  She has been using Bactroban ointment regularly.  No other medications or interventions tried.  No other complaints.  PMH, Surgical Hx, Family Hx, Social History reviewed and updated as below.  Past Medical History:  Diagnosis Date  . AAA (abdominal aortic aneurysm) ([place])   . Abdominal aneurysm without mention of rupture   . Atrioventricular block, complete ([place])   . Cardiac pacemaker in situ   . Gout   . Hernia   . Hyperlipidemia   . Hypertension   . Rectal bleeding     Patient Active Problem List   Diagnosis Date Noted  . Bleeding hemorrhoids 02/27/2018  . Weight loss 08/30/2017  . Low back pain 08/30/2017  . Depression, major, single episode, mild ([place]) 05/20/2017  . Fall 05/20/2017  . Hypoglycemia 04/04/2017  . Gout 06/05/2016  . AAA (abdominal aortic aneurysm) without rupture ([place]) 04/18/2016  . Loss of sense of smell 12/30/2015  . Memory difficulties 12/30/2015  . Injury of right lower extremity and cellulitis 11/29/2014  . 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 Renal artery stenosis ([place]) 09/15/2014  . Renal arterial aneurysm ([place]) 09/15/2014  . CN (constipation) 09/06/2014  . Change in stool caliber 09/06/2014  . Atherosclerosis of aorto-iliac bypass graft ([place]) 08/17/2014  . Shortness of breath 05/12/2014  . Conjunctival hemorrhage of right eye 08/13/2013  . Medicare annual wellness visit, subsequent 04/22/2013  . Allergic  rhinitis 11/26/2012  . COPD (chronic obstructive pulmonary disease) ([place]) 09/27/2011  . Hyperlipidemia 02/22/2011  . Long term current use of anticoagulant 04/12/2010  . HYPERTENSION, BENIGN 08/25/2009  . Atrial fibrillation ([place]) 03/07/2009  . Carotid stenosis 08/24/2008  . AV BLOCK, COMPLETE 06/17/2008  . Aneurysm of abdominal vessel ([place]) 06/17/2008  . PACEMAKER-[facility] 06/17/2008    Past Surgical History:  Procedure Laterality Date  . ABDOMINAL AORTIC ANEURYSM REPAIR    . HERNIA REPAIR    . ohter     growth on colon surgery  . PPM GENERATOR CHANGEOUT N/A 02/25/2017   Procedure: PPM GENERATOR CHANGEOUT;  Surgeon: Deboraha Sprang, MD;  Location: [facility];  Service: Cardiovascular;  Laterality: N/A;  . RENAL ANGIOGRAPHY Right 12/03/2017   Procedure: RENAL ANGIOGRAPHY;  Surgeon: Katha Cabal, MD;  Location: [facility];  Service: Cardiovascular;  Laterality: Right;  . TONSILLECTOMY      OB History   No obstetric history on file.      Home Medications    Prior to Admission medications   Medication Sig Start Date End Date Taking? Authorizing Provider  acetaminophen (TYLENOL) 500 MG tablet Take 500 mg by mouth 2 (two) times daily as needed for moderate pain or headache.    Yes [provider]  albuterol (PROAIR HFA) 108 (90 Base) MCG/ACT inhaler Inhale 2 puffs into the lungs every 6 (six) hours as needed for wheezing or shortness of breath. 02/24/18  Yes  Jodelle Green, FNP  amLODipine (NORVASC) 5 MG tablet Take 1 tablet (5 mg total) by mouth daily. 07/31/17  Yes Minna Merritts, MD  atenolol (TENORMIN) 25 MG tablet TAKE 1 TABLET BY MOUTH DAILY 10/31/18  Yes Gollan, Kathlene November, MD  cholecalciferol 25 MCG (1000 UT) TABS Take 1,000 Units by mouth daily.   Yes [provider]  clopidogrel (PLAVIX) 75 MG tablet Take 1 tablet (75 mg total) by mouth daily. 03/03/18  Yes Schnier, [name], MD  docusate sodium (COLACE) 100 MG capsule Take 1 capsule  (100 mg total) by mouth 2 (two) times daily. 01/22/18  Yes [name], MD  ELIQUIS 2.5 MG TABS tablet TAKE 1 TABLET BY MOUTH TWICE A DAY 06/30/18  Yes Deboraha Sprang, MD  rosuvastatin (CRESTOR) 5 MG tablet TAKE 1 TABLET BY MOUTH DAILY USUALLY IN THE EVENING 08/25/18  Yes Gollan, Kathlene November, MD  vitamin B-12 (CYANOCOBALAMIN) 1000 MCG tablet Take 1,000 mcg by mouth at bedtime.   Yes [provider]  mupirocin ointment (BACTROBAN) 2 % Apply 1 application topically 2 (two) times daily for 7 days. 11/12/18 11/19/18  Coral Spikes, DO  citalopram (CELEXA) 10 MG tablet Take 1 tablet (10 mg total) by mouth daily. 10/08/18 11/12/18  Jodelle Green, FNP    Family History Family History  Problem Relation Age of Onset  . Heart disease Father   . Alcohol abuse Father   . Cancer Brother   . Other Mother 35       MVA    Social History Social History   Tobacco Use  . Smoking status: Former Smoker    Packs/day: 1.50    Years: 50.00    Pack years: 75.00    Types: Cigarettes    Quit date: 10/03/1995    Years since quitting: 23.1  . Smokeless tobacco: Never Used  Substance Use Topics  . Alcohol use: Not Currently    Alcohol/week: 1.0 standard drinks    Types: 1 Glasses of wine per week    Comment: per week  . Drug use: No     Allergies   Codeine, Morphine, and Zyrtec [cetirizine]   Review of Systems Review of Systems  Constitutional: Negative.   Skin: Positive for wound.   Physical Exam Triage Vital Signs ED Triage Vitals [11/12/18 1839]  Enc Vitals Group     BP (!) 185/76     Pulse Rate 70     Resp 18     Temp 98.2 F (36.8 C)     Temp Source Oral     SpO2 98 %     Weight 121 lb (54.9 kg)     Height 5\' 5"  (1.651 m)     Head Circumference      Peak Flow      Pain Score 2     Pain Loc      Pain Edu?      Excl. in [place]?    Updated Vital Signs BP (!) 185/76 (BP Location: Left Arm)   Pulse 70   Temp 98.2 F (36.8 C) (Oral)   Resp 18   Ht 5\' 5"  (1.651 m)   Wt  54.9 kg   SpO2 98%   BMI 20.14 kg/m   Visual Acuity Right Eye Distance:   Left Eye Distance:   Bilateral Distance:    Right Eye Near:   Left Eye Near:    Bilateral Near:     Physical Exam Vitals signs and nursing  note reviewed.  Constitutional:      General: She is not in acute distress.    Appearance: Normal appearance.  HENT:     Head: Normocephalic and atraumatic.  Eyes:     General:        Right eye: No discharge.        Left eye: No discharge.     Conjunctiva/sclera: Conjunctivae normal.  Cardiovascular:     Rate and Rhythm: Normal rate and regular rhythm.  Pulmonary:     Comments: Mild increased work of breathing noted.  Patient states that this is chronic.  Diffuse expiratory wheezing. Skin:    Comments: Dorsum of the right hand with a healing wound with eschar.  No surrounding erythema.  No drainage.  No warmth.  Extensive bruising noted of the hand extending proximally to the elbow.  Neurological:     Mental Status: She is alert.  Psychiatric:        Mood and Affect: Mood normal.        Behavior: Behavior normal.    UC Treatments / Results  Labs (all labs ordered are listed, but only abnormal results are displayed) Labs Reviewed - No data to display  EKG   Radiology No results found.  Procedures Procedures (including critical care time)  Medications Ordered in UC Medications - No data to display  Initial Impression / Assessment and Plan / UC Course  I have reviewed the triage vital signs and the nursing notes.  Pertinent labs & imaging results that were available during my care of the patient were reviewed by me and considered in my medical decision making (see chart for details).    85 year old female presents with an injury and subsequent wound to the dorsum of her right hand.  No evidence of infection at this time.  Topical Bactroban ointment as prescribed.  Supportive care.  Final Clinical Impressions(s) / UC Diagnoses   Final diagnoses:   Injury of right hand, initial encounter     Discharge Instructions     Doesn't look infected.  Keep a close eye.  Antibiotic ointment twice daily.  Dr. Lacinda Axon    ED Prescriptions    Medication Sig Dispense Auth. Provider   mupirocin ointment (BACTROBAN) 2 % Apply 1 application topically 2 (two) times daily for 7 days. 22 g Coral Spikes, DO     PDMP not reviewed this encounter.   Coral Spikes, [place] 11/12/18 2004

## 2018-11-12 NOTE — Discharge Instructions (Signed)
Doesn't look infected.  Keep a close eye.  Antibiotic ointment twice daily.  Dr. Lacinda Axon

## 2018-12-25 ENCOUNTER — Telehealth (INDEPENDENT_AMBULATORY_CARE_PROVIDER_SITE_OTHER): Payer: Self-pay

## 2018-12-25 ENCOUNTER — Other Ambulatory Visit: Payer: Self-pay

## 2018-12-25 ENCOUNTER — Emergency Department: Payer: Medicare Other

## 2018-12-25 ENCOUNTER — Encounter: Payer: Self-pay | Admitting: Emergency Medicine

## 2018-12-25 ENCOUNTER — Emergency Department
Admission: EM | Admit: 2018-12-25 | Discharge: 2018-12-25 | Disposition: A | Discharge status: Home / Self Care | Payer: Medicare Other | Attending: Emergency Medicine | Admitting: Emergency Medicine

## 2018-12-25 DIAGNOSIS — N200 Calculus of kidney: Secondary | ICD-10-CM

## 2018-12-25 DIAGNOSIS — I10 Essential (primary) hypertension: Secondary | ICD-10-CM | POA: Insufficient documentation

## 2018-12-25 DIAGNOSIS — Z7902 Long term (current) use of antithrombotics/antiplatelets: Secondary | ICD-10-CM | POA: Diagnosis not present

## 2018-12-25 DIAGNOSIS — J449 Chronic obstructive pulmonary disease, unspecified: Secondary | ICD-10-CM | POA: Diagnosis not present

## 2018-12-25 DIAGNOSIS — Z7901 Long term (current) use of anticoagulants: Secondary | ICD-10-CM | POA: Diagnosis not present

## 2018-12-25 DIAGNOSIS — Z79899 Other long term (current) drug therapy: Secondary | ICD-10-CM | POA: Diagnosis not present

## 2018-12-25 DIAGNOSIS — Z87891 Personal history of nicotine dependence: Secondary | ICD-10-CM | POA: Insufficient documentation

## 2018-12-25 DIAGNOSIS — Z950 Presence of cardiac pacemaker: Secondary | ICD-10-CM | POA: Insufficient documentation

## 2018-12-25 DIAGNOSIS — N132 Hydronephrosis with renal and ureteral calculous obstruction: Secondary | ICD-10-CM | POA: Insufficient documentation

## 2018-12-25 DIAGNOSIS — R319 Hematuria, unspecified: Secondary | ICD-10-CM | POA: Diagnosis not present

## 2018-12-25 LAB — URINALYSIS, COMPLETE (UACMP) WITH MICROSCOPIC
Bacteria, UA: NONE SEEN
RBC / HPF: >50 RBC/hpf — ABNORMAL HIGH (ref 0–5)
Specific Gravity, Urine: 1.012 (ref 1.005–1.030)
Squamous Epithelial / HPF: NONE SEEN (ref 0–5)

## 2018-12-25 LAB — CBC
HCT: 40 % (ref 36.0–46.0)
Hemoglobin: 13.3 g/dL (ref 12.0–15.0)
MCH: 30.3 pg (ref 26.0–34.0)
MCHC: 33.3 g/dL (ref 30.0–36.0)
MCV: 91.1 fL (ref 80.0–100.0)
Platelets: 152 10*3/uL (ref 150–400)
RBC: 4.39 MIL/uL (ref 3.87–5.11)
RDW: 13.6 % (ref 11.5–15.5)
WBC: 7.7 10*3/uL (ref 4.0–10.5)
nRBC: 0 % (ref 0.0–0.2)

## 2018-12-25 LAB — BASIC METABOLIC PANEL
Anion gap: 10 (ref 5–15)
BUN: 18 mg/dL (ref 8–23)
CO2: 27 mmol/L (ref 22–32)
Calcium: 9.4 mg/dL (ref 8.9–10.3)
Chloride: 101 mmol/L (ref 98–111)
Creatinine, Ser: 1.43 mg/dL — ABNORMAL HIGH (ref 0.44–1.00)
GFR calc Af Amer: 38 mL/min — ABNORMAL LOW (ref >60)
GFR calc non Af Amer: 33 mL/min — ABNORMAL LOW (ref >60)
Glucose, Bld: 123 mg/dL — ABNORMAL HIGH (ref 70–99)
Potassium: 3.7 mmol/L (ref 3.5–5.1)
Sodium: 138 mmol/L (ref 135–145)

## 2018-12-25 MED ORDER — TRAMADOL HCL 50 MG PO TABS: 50.0000 mg | ORAL_TABLET | Freq: Four times a day (QID) | ORAL | 0 refills | Status: DC | PRN

## 2018-12-25 MED ORDER — TAMSULOSIN HCL 0.4 MG PO CAPS: 0.4000 mg | ORAL_CAPSULE | Freq: Every day | ORAL | 0 refills | Status: AC

## 2018-12-25 NOTE — ED Triage Notes (Signed)
Pt sent from [facility] with c/o hematuria. PT states started this afternoon with RT sided flank pain. Pt denies any dysuria or fever. NAD noted

## 2018-12-25 NOTE — Telephone Encounter (Signed)
Patient had left a message stating that she was bleeding and having pain. Patient stated she needed to be seen. I have reach out to the patient but there was no answer.

## 2018-12-25 NOTE — ED Notes (Signed)
Pt ambulatory in lobby with aid of rolling walker; no difficulty or distress noted; pt updated on wait time and vs retaken; son updated via phone as well; both voice good understanding

## 2018-12-25 NOTE — ED Notes (Signed)
Patient transported to CT 

## 2018-12-25 NOTE — ED Provider Notes (Signed)
[facility] Emergency Department Provider Note  Time seen: 9:15 PM  I have reviewed the triage vital signs and the nursing notes.   HISTORY  Chief Complaint Hematuria   HPI Jessica [name] is a 85 y.o. female with a past medical history of a AAA, hypertension, hyperlipidemia, presents to the emergency department for hematuria.  According to the patient approximately 3 or 4 hours prior to arrival she developed hematuria.  Denies any dysuria.  States this has happened before but she is not sure why, follows up with Dr. Delana Meyer.   Patient takes Eliquis.  Denies any fever cough shortness of breath.  Denies any abdominal pain.  Past Medical History:  Diagnosis Date  . AAA (abdominal aortic aneurysm) ([place])   . Abdominal aneurysm without mention of rupture   . Atrioventricular block, complete ([place])   . Cardiac pacemaker in situ   . Gout   . Hernia   . Hyperlipidemia   . Hypertension   . Rectal bleeding     Patient Active Problem List   Diagnosis Date Noted  . Bleeding hemorrhoids 02/27/2018  . Weight loss 08/30/2017  . Low back pain 08/30/2017  . Depression, major, single episode, mild ([place]) 05/20/2017  . Fall 05/20/2017  . Hypoglycemia 04/04/2017  . Gout 06/05/2016  . AAA (abdominal aortic aneurysm) without rupture ([place]) 04/18/2016  . Loss of sense of smell 12/30/2015  . Memory difficulties 12/30/2015  . Injury of right lower extremity and cellulitis 11/29/2014  . Renal artery aneurysm ([place]) 10/26/2014  . Renal artery stenosis ([place]) 09/15/2014  . Renal arterial aneurysm ([place]) 09/15/2014  . CN (constipation) 09/06/2014  . Change in stool caliber 09/06/2014  . 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 Shortness of breath 05/12/2014  . Conjunctival hemorrhage of right eye 08/13/2013  . Medicare annual wellness visit, subsequent 04/22/2013  . Allergic rhinitis 11/26/2012  . COPD (chronic obstructive pulmonary disease) ([place])  09/27/2011  . Hyperlipidemia 02/22/2011  . Long term current use of anticoagulant 04/12/2010  . HYPERTENSION, BENIGN 08/25/2009  . Atrial fibrillation ([place]) 03/07/2009  . Carotid stenosis 08/24/2008  . AV BLOCK, COMPLETE 06/17/2008  . Aneurysm of abdominal vessel ([place]) 06/17/2008  . PACEMAKER-[facility] 06/17/2008    Past Surgical History:  Procedure Laterality Date  . ABDOMINAL AORTIC ANEURYSM REPAIR    . HERNIA REPAIR    . ohter     growth on colon surgery  . PPM GENERATOR CHANGEOUT N/A 02/25/2017   Procedure: PPM GENERATOR CHANGEOUT;  Surgeon: Deboraha Sprang, MD;  Location: [facility];  Service: Cardiovascular;  Laterality: N/A;  . RENAL ANGIOGRAPHY Right 12/03/2017   Procedure: RENAL ANGIOGRAPHY;  Surgeon: Katha Cabal, MD;  Location: [facility];  Service: Cardiovascular;  Laterality: Right;  . TONSILLECTOMY      Prior to Admission medications   Medication Sig Start Date End Date Taking? Authorizing Provider  acetaminophen (TYLENOL) 500 MG tablet Take 500 mg by mouth 2 (two) times daily as needed for moderate pain or headache.     [provider]  albuterol (PROAIR HFA) 108 (90 Base) MCG/ACT inhaler Inhale 2 puffs into the lungs every 6 (six) hours as needed for wheezing or shortness of breath. 02/24/18   Jodelle Green, FNP  amLODipine (NORVASC) 5 MG tablet Take 1 tablet (5 mg total) by mouth daily. 07/31/17   Minna Merritts, MD  atenolol (TENORMIN) 25 MG tablet TAKE 1 TABLET BY MOUTH DAILY 10/31/18  Minna Merritts, MD  cholecalciferol 25 MCG (1000 UT) TABS Take 1,000 Units by mouth daily.    [provider]  clopidogrel (PLAVIX) 75 MG tablet Take 1 tablet (75 mg total) by mouth daily. 03/03/18   Schnier, [name], MD  docusate sodium (COLACE) 100 MG capsule Take 1 capsule (100 mg total) by mouth 2 (two) times daily. 01/22/18   [name], MD  ELIQUIS 2.5 MG TABS tablet TAKE 1 TABLET BY MOUTH TWICE A DAY 06/30/18   Deboraha Sprang, MD   rosuvastatin (CRESTOR) 5 MG tablet TAKE 1 TABLET BY MOUTH DAILY USUALLY IN THE EVENING 08/25/18   Minna Merritts, MD  vitamin B-12 (CYANOCOBALAMIN) 1000 MCG tablet Take 1,000 mcg by mouth at bedtime.    [provider]  citalopram (CELEXA) 10 MG tablet Take 1 tablet (10 mg total) by mouth daily. 10/08/18 11/12/18  Jodelle Green, FNP    Allergies  Allergen Reactions  . Codeine Other (See Comments)    Made her feel crazy  . Morphine Other (See Comments)    Made her feel crazy  . Zyrtec [Cetirizine] Other (See Comments)    Causes facial numbness    Family History  Problem Relation Age of Onset  . Heart disease Father   . Alcohol abuse Father   . Cancer Brother   . Other Mother 20       MVA    Social History Social History   Tobacco Use  . Smoking status: Former Smoker    Packs/day: 1.50    Years: 50.00    Pack years: 75.00    Types: Cigarettes    Quit date: 10/03/1995    Years since quitting: 23.2  . Smokeless tobacco: Never Used  Substance Use Topics  . Alcohol use: Not Currently    Alcohol/week: 1.0 standard drinks    Types: 1 Glasses of wine per week    Comment: per week  . Drug use: No    Review of Systems Constitutional: Negative for fever Cardiovascular: Negative for chest pain. Respiratory: Negative for shortness of breath. Gastrointestinal: Negative for abdominal pain Genitourinary: Positive for hematuria Musculoskeletal: Negative for musculoskeletal complaints Neurological: Negative for headache All other ROS negative  ____________________________________________   PHYSICAL EXAM:  VITAL SIGNS: ED Triage Vitals  Enc Vitals Group     BP 12/25/18 1626 (!) 166/90     Pulse Rate 12/25/18 1626 70     Resp 12/25/18 1626 16     Temp 12/25/18 1626 98.1 F (36.7 C)     Temp Source 12/25/18 1626 Oral     SpO2 12/25/18 1626 100 %     Weight --      Height --      Head Circumference --      Peak Flow --      Pain Score 12/25/18 1627 4      Pain Loc --      Pain Edu? --      Excl. in [place]? --     Constitutional: Alert and oriented. Well appearing and in no distress. Eyes: Normal exam ENT      Head: Normocephalic and atraumatic.      Mouth/Throat: Mucous membranes are moist. Cardiovascular: Normal rate, regular rhythm. Respiratory: Normal respiratory effort without tachypnea nor retractions. Breath sounds are clear  Gastrointestinal: Soft and nontender. No distention.   Musculoskeletal: Nontender with normal range of motion in all extremities.  Neurologic:  Normal speech and language. No gross focal neurologic  deficits  Skin:  Skin is warm, dry and intact.  Psychiatric: Mood and affect are normal.   ____________________________________________    RADIOLOGY  CT consistent 4 mm proximal right ureteral stone as well as 3 mm UPJ stone.  ____________________________________________   INITIAL IMPRESSION / ASSESSMENT AND PLAN / ED COURSE  Pertinent labs & imaging results that were available during my care of the patient were reviewed by me and considered in my medical decision making (see chart for details).   Patient presents to the emergency department for hematuria starting today.  Denies dysuria.  Denies fever denies abdominal pain.  Overall the patient appears very well benign abdominal exam.  Patient does state very slight CVA tenderness on the right.  Patient has seen Dr. Delana Meyer of vascular surgery in the past for a renal artery stent.  Patient is on Eliquis which could explain the patient's hematuria.  However given the mild right flank pain we will proceed with CT imaging to rule out ureterolithiasis or other concerning pathology.  Patient's urinalysis does not show any bacteria, urine culture has been ordered.  Chronic renal insufficiency largely unchanged.  Blood counts are normal.  CT shows 4 mm and 3 mm right ureteral stones.  Renal function is largely unchanged from baseline.  We will place the patient on Flomax,  tramadol as needed for pain and have the patient follow-up with urology this coming week.  I discussed with the patient the need to return to the emergency department for any increase in pain development of fever.  Patient agreeable to plan of care.  Jessica [name] was evaluated in Emergency Department on 12/25/2018 for the symptoms described in the history of present illness. She was evaluated in the context of the global COVID-19 pandemic, which necessitated consideration that the patient might be at risk for infection with the SARS-CoV-2 virus that causes COVID-19. Institutional protocols and algorithms that pertain to the evaluation of patients at risk for COVID-19 are in a state of rapid change based on information released by regulatory bodies including the CDC and federal and state organizations. These policies and algorithms were followed during the patient's care in the ED.  ____________________________________________   FINAL CLINICAL IMPRESSION(S) / ED DIAGNOSES  Hematuria Kidney stone   Harvest Dark, MD 12/25/18 2217

## 2018-12-25 NOTE — ED Triage Notes (Signed)
First Nurse Note:  Arrives from [facility] for ED evaluation of flank pain and hematuria x 2 hours  Patient takes Eliquis.  AAOx3.  Skin warm and dry. NAD

## 2018-12-25 NOTE — ED Notes (Signed)
Pt on elequis

## 2018-12-25 NOTE — Discharge Instructions (Signed)
Please call the number provided for urology tomorrow to arrange a follow-up appointment as soon as possible preferably within the next 1 week.  Return to the emergency department for any significant worsening of pain, any fever, or any other symptom personally concerning to yourself.

## 2018-12-26 LAB — URINE CULTURE: Culture: <10000 — AB

## 2018-12-29 ENCOUNTER — Other Ambulatory Visit: Payer: Self-pay

## 2018-12-29 DIAGNOSIS — R310 Gross hematuria: Secondary | ICD-10-CM

## 2018-12-30 ENCOUNTER — Other Ambulatory Visit: Payer: Self-pay

## 2018-12-30 ENCOUNTER — Encounter: Payer: Self-pay | Admitting: Urology

## 2018-12-30 ENCOUNTER — Other Ambulatory Visit
Admission: RE | Admit: 2018-12-30 | Discharge: 2018-12-30 | Disposition: A | Discharge status: Home / Self Care | Payer: Medicare Other | Attending: Urology | Admitting: Urology

## 2018-12-30 ENCOUNTER — Ambulatory Visit (INDEPENDENT_AMBULATORY_CARE_PROVIDER_SITE_OTHER): Payer: Medicare Other | Admitting: *Deleted

## 2018-12-30 ENCOUNTER — Telehealth: Payer: Self-pay

## 2018-12-30 ENCOUNTER — Ambulatory Visit: Payer: Medicare Other | Admitting: Urology

## 2018-12-30 ENCOUNTER — Telehealth: Payer: Self-pay | Admitting: Internal Medicine

## 2018-12-30 VITALS — BP 155/78 | HR 68 | Ht 64.0 in | Wt 124.0 lb

## 2018-12-30 DIAGNOSIS — I442 Atrioventricular block, complete: Secondary | ICD-10-CM

## 2018-12-30 DIAGNOSIS — R310 Gross hematuria: Secondary | ICD-10-CM

## 2018-12-30 DIAGNOSIS — N200 Calculus of kidney: Secondary | ICD-10-CM

## 2018-12-30 LAB — URINALYSIS, COMPLETE (UACMP) WITH MICROSCOPIC
Bilirubin Urine: NEGATIVE
Glucose, UA: NEGATIVE mg/dL
Ketones, ur: NEGATIVE mg/dL
Leukocytes,Ua: NEGATIVE
Nitrite: NEGATIVE
Protein, ur: 30 mg/dL — AB
RBC / HPF: >50 RBC/hpf (ref 0–5)
Specific Gravity, Urine: 1.015 (ref 1.005–1.030)
pH: 6 (ref 5.0–8.0)

## 2018-12-30 LAB — CUP PACEART REMOTE DEVICE CHECK
Battery Remaining Longevity: 118 mo
Battery Remaining Percentage: 95.5 %
Battery Voltage: 3.01 V
Brady Statistic AP VP Percent: 99 %
Brady Statistic AP VS Percent: 1 %
Brady Statistic AS VP Percent: 1 %
Brady Statistic AS VS Percent: 1 %
Brady Statistic RA Percent Paced: 27 %
Brady Statistic RV Percent Paced: 99 %
Date Time Interrogation Session: 20201208040015
Implantable Lead Implant Date: 20090610
Implantable Lead Implant Date: 20090610
Implantable Lead Location: 753859
Implantable Lead Location: 753860
Implantable Lead Model: 5076
Implantable Pulse Generator Implant Date: 20190204
Lead Channel Impedance Value: 380 Ohm
Lead Channel Impedance Value: 450 Ohm
Lead Channel Pacing Threshold Amplitude: 0.75 V
Lead Channel Pacing Threshold Amplitude: 0.75 V
Lead Channel Pacing Threshold Pulse Width: 0.5 ms
Lead Channel Pacing Threshold Pulse Width: 0.5 ms
Lead Channel Sensing Intrinsic Amplitude: 0.6 mV
Lead Channel Sensing Intrinsic Amplitude: 4.2 mV
Lead Channel Setting Pacing Amplitude: 1 V
Lead Channel Setting Pacing Amplitude: 2 V
Lead Channel Setting Pacing Pulse Width: 0.5 ms
Lead Channel Setting Sensing Sensitivity: 2 mV
Pulse Gen Model: 2272
Pulse Gen Serial Number: 8991929

## 2018-12-30 NOTE — Telephone Encounter (Signed)
Patient with diagnosis of ATRIAL FIBRILLATION on ELIQUIS for anticoagulation.    Procedure: URETEROSCOPY, LASER LITHOTRPSY Date of procedure: 01/01/2019  CHADS2-VASc score of  5 (HTN, AGE X 2, CAD, female)  CrCl = 24 ML/MIN Platelet count = 173  Per office protocol, patient can hold ELIQUIS for 2 days prior to procedure.

## 2018-12-30 NOTE — Telephone Encounter (Signed)
Follow up   Per [facility] states that the patient can stay on Aspirin if needed for the surgery. Please call with questions if needed.

## 2018-12-30 NOTE — Progress Notes (Signed)
° °  12/30/2018 11:04 AM   Jessica [name] February 01, 1929 [id number]  Reason for visit: Hematuria  HPI: I saw Jessica [name] back in urology clinic today for ER follow-up for hematuria.  She is here with one of her friends.  She is an 85 year old female with multiple co-morbidities including history of AAA repair, atrial fibrillation on Eliquis, atrophic left kidney, right renal artery stenosis status post vascular stent placement with Dr. Delana Meyer 12/03/2017 on plavix.  I originally saw her on 11/25/2017 to review CT urogram findings that showed a right lower pole mass/filling defect worrisome for urothelial cell carcinoma.  I recommended right ureteroscopy and biopsy at that time, however the patient and her son adamantly refused any intervention or work-up.  I had set her up for follow-up with oncology and radiation to see if there were any other less invasive treatment options, however she did not follow-up with them or me and was lost to follow-up.  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  Urine culture showed no growth, renal function was stable, and she was discharged home with medical expulsive therapy.  She has not had any flank pain, fevers, chills, or other complaints aside from the intermittent gross hematuria.  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  She has a very complex presentation.  I strongly recommended proceeding with ureteroscopy, laser lithotripsy, possible biopsy of lower pole  mass/tumor, and stent placement this week.  We discussed the risks and benefits at length.  On my review of the CT, there is a possible punctate left distal ureteral stone that appears stable from prior imaging, with no hydronephrosis.  We discussed urgent return precautions including a anuria, fevers or chills, or uncontrolled right-sided flank pain.  She took Eliquis and Plavix this morning, and we will discuss her case with vascular surgery and cardiology to try to hold these starting today in anticipation of surgery on Thursday or Friday of this week.  I would be willing to perform the procedure on baby aspirin, but cannot perform any biopsy of an upper tract mass on Eliquis or Plavix.  Would likely be able to resume anticoagulation 24-48 hours after surgery.  We specifically discussed the risks ureteroscopy including bleeding, infection/sepsis, stent related symptoms including flank pain/urgency/frequency/incontinence/dysuria, ureteral injury, inability to access stone, or need for staged or additional procedures.  Cystoscopy, bilateral retrograde pyelograms, right ureteroscopy, laser lithotripsy, possible biopsy of upper tract mass, stent placement this week  A total of 40 minutes were spent face-to-face with the patient, greater than 50% was spent in patient education, counseling, and coordination of care regarding upper tract mass worrisome for urothelial cell carcinoma, right proximal ureteral stone, atrophic left kidney.   Billey Co, [facility] [address] [place], [place] [address] [phone]

## 2018-12-30 NOTE — Telephone Encounter (Signed)
Primary Cardiologist:Timothy Rockey Situ, MD  Chart reviewed as part of pre-operative protocol coverage. Because of KANA REIMANN past medical history and time since last visit, he/she will require a follow-up visit in order to better assess preoperative cardiovascular risk.  Pre-op covering staff: - Please schedule appointment and call patient to inform them. - Please contact requesting surgeon's office via preferred method (i.e, phone, fax) to inform them of need for appointment prior to surgery.  If applicable, this message will also be routed to pharmacy pool and/or primary cardiologist for input on holding anticoagulant/antiplatelet agent as requested below so that this information is available at time of patient's appointment.     Jossie Ng. [facility] [place] [address] Office [phone] Fax [phone]

## 2018-12-30 NOTE — Patient Instructions (Signed)
Laser Therapy for Kidney Stones Laser therapy for kidney stones is a procedure to break up small, hard mineral deposits that form in the kidney (kidney stones). The procedure is done using a device that produces a focused beam of light (laser). The laser breaks up kidney stones into pieces that are small enough to be passed out of the body through urination or removed from the body during the procedure. You may need laser therapy if you have kidney stones that are painful or block your urinary tract. This procedure is done by inserting a tube (ureteroscope) into your kidney through the urethral opening. The urethra is the part of the body that drains urine from the bladder. In women, the urethra opens above the vaginal opening. In men, the urethra opens at the tip of the penis. The ureteroscope is inserted through the urethra, and surgical instruments are moved through the bladder and the muscular tube that connects the kidney to the bladder (ureter) until they reach the kidney. Tell a health care provider about:  Any allergies you have.  All medicines you are taking, including vitamins, herbs, eye drops, creams, and over-the-counter medicines.  Any problems you or family members have had with anesthetic medicines.  Any blood disorders you have.  Any surgeries you have had.  Any medical conditions you have.  Whether you are pregnant or may be pregnant. What are the risks? Generally, this is a safe procedure. However, problems may occur, including:  Infection.  Bleeding.  Allergic reactions to medicines.  Damage to the urethra, bladder, or ureter.  Urinary tract infection (UTI).  Narrowing of the urethra (urethral stricture).  Difficulty passing urine.  Blockage of the kidney caused by a fragment of kidney stone. What happens before the procedure? Medicines  Ask your health care provider about: ? Changing or stopping your regular medicines. This is especially important if you  are taking diabetes medicines or blood thinners. ? Taking medicines such as aspirin and ibuprofen. These medicines can thin your blood. Do not take these medicines unless your health care provider tells you to take them. ? Taking over-the-counter medicines, vitamins, herbs, and supplements. Eating and drinking Follow instructions from your health care provider about eating and drinking, which may include:  8 hours before the procedure - stop eating heavy meals or foods, such as meat, fried foods, or fatty foods.  6 hours before the procedure - stop eating light meals or foods, such as toast or cereal.  6 hours before the procedure - stop drinking milk or drinks that contain milk.  2 hours before the procedure - stop drinking clear liquids. Staying hydrated Follow instructions from your health care provider about hydration, which may include:  Up to 2 hours before the procedure - you may continue to drink clear liquids, such as water, clear fruit juice, black coffee, and plain tea.  General instructions  You may have a physical exam before the procedure. You may also have tests, such as imaging tests and blood or urine tests.  If your ureter is too narrow, your health care provider may place a soft, flexible tube (stent) inside of it. The stent may be placed days or weeks before your laser therapy procedure.  Plan to have someone take you home from the hospital or clinic.  If you will be going home right after the procedure, plan to have someone stay with you for 24 hours.  Do not use any products that contain nicotine or tobacco for at least 4  weeks before the procedure. These products include cigarettes, e-cigarettes, and chewing tobacco. If you need help quitting, ask your health care provider.  Ask your health care provider: ? How your surgical site will be marked or identified. ? What steps will be taken to help prevent infection. These may include:  Removing hair at the surgery  site.  Washing skin with a germ-killing soap.  Taking antibiotic medicine. What happens during the procedure?   An IV will be inserted into one of your veins.  You will be given one or more of the following: ? A medicine to help you relax (sedative). ? A medicine to numb the area (local anesthetic). ? A medicine to make you fall asleep (general anesthetic).  A ureteroscope will be inserted into your urethra. The ureteroscope will send images to a video screen in the operating room to guide your surgeon to the area of your kidney that will be treated.  A small, flexible tube will be threaded through the ureteroscope and into your bladder and ureter, up to your kidney.  The laser device will be inserted into your kidney through the tube. Your surgeon will pulse the laser on and off to break up kidney stones.  A surgical instrument that has a tiny wire basket may be inserted through the tube into your kidney to remove the pieces of broken kidney stone. The procedure may vary among health care providers and hospitals. What happens after the procedure?  Your blood pressure, heart rate, breathing rate, and blood oxygen level will be monitored until you leave the hospital or clinic.  You will be given pain medicine as needed.  You may continue to receive antibiotics.  You may have a stent temporarily placed in your ureter.  Do not drive for 24 hours if you were given a sedative during your procedure.  You may be given a strainer to collect any stone fragments that you pass in your urine. Your health care provider may have these tested. Summary  Laser therapy for kidney stones is a procedure to break up kidney stones into pieces that are small enough to be passed out of the body through urination or removed during the procedure.  Follow instructions from your health care provider about eating and drinking before the procedure.  During the procedure, the ureteroscope will send images  to a video screen to guide your surgeon to the area of your kidney that will be treated.  Do not drive for 24 hours if you were given a sedative during your procedure. This information is not intended to replace advice given to you by your health care provider. Make sure you discuss any questions you have with your health care provider. Document Released: 02/04/2015 Document Revised: 09/19/2017 Document Reviewed: 09/19/2017 Elsevier Patient Education  2020 [employer].   Ureteroscopy Ureteroscopy is a procedure to check for and treat problems inside part of the urinary tract. In this procedure, a thin, tube-shaped instrument with a light at the end (ureteroscope) is used to look at the inside of the kidneys and the ureters, which are the tubes that carry urine from the kidneys to the bladder. The ureteroscope is inserted into one or both of the ureters. You may need this procedure if you have frequent urinary tract infections (UTIs), blood in your urine, or a stone in one of your ureters. A ureteroscopy can be done to find the cause of urine blockage in a ureter and to evaluate other abnormalities inside the ureters or  kidneys. If stones are found, they can be removed during the procedure. Polyps, abnormal tissue, and some types of tumors can also be removed or treated. The ureteroscope may also have a tool to remove tissue to be checked for disease under a microscope (biopsy). Tell a health care provider about:  Any allergies you have.  All medicines you are taking, including vitamins, herbs, eye drops, creams, and over-the-counter medicines.  Any problems you or family members have had with anesthetic medicines.  Any blood disorders you have.  Any surgeries you have had.  Any medical conditions you have.  Whether you are pregnant or may be pregnant. What are the risks? Generally, this is a safe procedure. However, problems may occur, including:  Bleeding.  Infection.  Allergic  reactions to medicines.  Scarring that narrows the ureter (stricture).  Creating a hole in the ureter (perforation). What happens before the procedure? Staying hydrated Follow instructions from your health care provider about hydration, which may include:  Up to 2 hours before the procedure - you may continue to drink clear liquids, such as water, clear fruit juice, black coffee, and plain tea. Eating and drinking restrictions Follow instructions from your health care provider about eating and drinking, which may include:  8 hours before the procedure - stop eating heavy meals or foods such as meat, fried foods, or fatty foods.  6 hours before the procedure - stop eating light meals or foods, such as toast or cereal.  6 hours before the procedure - stop drinking milk or drinks that contain milk.  2 hours before the procedure - stop drinking clear liquids. Medicines  Ask your health care provider about: ? Changing or stopping your regular medicines. This is especially important if you are taking diabetes medicines or blood thinners. ? Taking medicines such as aspirin and ibuprofen. These medicines can thin your blood. Do not take these medicines before your procedure if your health care provider instructs you not to.  You may be given antibiotic medicine to help prevent infection. General instructions  You may have a urine sample taken to check for infection.  Plan to have someone take you home from the hospital or clinic. What happens during the procedure?   To reduce your risk of infection: ? Your health care team will wash or sanitize their hands. ? Your skin will be washed with soap.  An IV tube will be inserted into one of your veins.  You will be given one of the following: ? A medicine to help you relax (sedative). ? A medicine to make you fall asleep (general anesthetic). ? A medicine that is injected into your spine to numb the area below and slightly above the  injection site (spinal anesthetic).  To lower your risk of infection, you may be given an antibiotic medicine by an injection or through the IV tube.  The opening from which you urinate (urethra) will be cleaned with a germ-killing solution.  The ureteroscope will be passed through your urethra into your bladder.  A salt-water solution will flow through the ureteroscope to fill your bladder. This will help the health care provider see the openings of your ureters more clearly.  Then, the ureteroscope will be passed into your ureter. ? If a growth is found, a piece of it may be removed so it can be examined under a microscope (biopsy). ? If a stone is found, it may be removed through the ureteroscope, or the stone may be broken up using  a laser, shock waves, or electrical energy. ? In some cases, if the ureter is too small, a tube may be inserted that keeps the ureter open (ureteral stent). The stent may be left in place for 1 or 2 weeks to keep the ureter open, and then the ureteroscopy procedure will be performed.  The scope will be removed, and your bladder will be emptied. The procedure may vary among health care providers and hospitals. What happens after the procedure?  Your blood pressure, heart rate, breathing rate, and blood oxygen level will be monitored until the medicines you were given have worn off.  You may be asked to urinate.  Donot drive for 24 hours if you were given a sedative. This information is not intended to replace advice given to you by your health care provider. Make sure you discuss any questions you have with your health care provider. Document Released: 01/13/2013 Document Revised: 12/21/2016 Document Reviewed: 10/21/2015 Elsevier Patient Education  [place].    Ureteral Stent Implantation  Ureteral stent implantation is a procedure to insert (implant) a flexible, soft, plastic tube (stent) into a ureter. Ureters are the tube-like parts of the  body that drain urine from the kidneys. The stent supports the ureter while it heals and helps to drain urine. You may have a ureteral stent implanted after having a procedure to remove a blockage from the ureter (ureterolysis or pyeloplasty). You may also have a stent implanted to open the flow of urine when you have a blockage caused by a kidney stone, tumor, blood clot, or infection. You have two ureters, one on each side of the body. The ureters connect the kidneys to the organ that holds urine until it passes out of the body (bladder). The stent is placed so that one end is in the kidney, and one end is in the bladder. The stent is usually taken out after your ureter has healed. Depending on your condition, you may have a stent for just a few weeks, or you may have a long-term stent that will need to be replaced every few months. Tell a health care provider about:  Any allergies you have.  All medicines you are taking, including vitamins, herbs, eye drops, creams, and over-the-counter medicines.  Any problems you or family members have had with anesthetic medicines.  Any blood disorders you have.  Any surgeries you have had.  Any medical conditions you have.  Whether you are pregnant or may be pregnant. What are the risks? Generally, this is a safe procedure. However, problems may occur, including:  Infection.  Bleeding.  Allergic reactions to medicines.  Damage to other structures or organs. Tearing (perforation) of the ureter is possible.  Movement of the stent away from where it is placed during surgery (migration). What happens before the procedure? Medicines Ask your health care provider about:  Changing or stopping your regular medicines. This is especially important if you are taking diabetes medicines or blood thinners.  Taking medicines such as aspirin and ibuprofen. These medicines can thin your blood. Do not take these medicines unless your health care provider  tells you to take them.  Taking over-the-counter medicines, vitamins, herbs, and supplements. Eating and drinking Follow instructions from your health care provider about eating and drinking, which may include:  8 hours before the procedure - stop eating heavy meals or foods, such as meat, fried foods, or fatty foods.  6 hours before the procedure - stop eating light meals or foods, such  as toast or cereal.  6 hours before the procedure - stop drinking milk or drinks that contain milk.  2 hours before the procedure - stop drinking clear liquids. Staying hydrated Follow instructions from your health care provider about hydration, which may include:  Up to 2 hours before the procedure - you may continue to drink clear liquids, such as water, clear fruit juice, black coffee, and plain tea. General instructions  Do not drink alcohol.  Do not use any products that contain nicotine or tobacco for at least 4 weeks before the procedure. These products include cigarettes, e-cigarettes, and chewing tobacco. If you need help quitting, ask your health care provider.  You may have an exam or testing, such as imaging or blood tests.  Ask your health care provider what steps will be taken to help prevent infection. These may include: ? Removing hair at the surgery site. ? Washing skin with a germ-killing soap. ? Taking antibiotic medicine.  Plan to have someone take you home from the hospital or clinic.  If you will be going home right after the procedure, plan to have someone with you for 24 hours. What happens during the procedure?  An IV will be inserted into one of your veins.  You may be given a medicine to help you relax (sedative).  You may be given a medicine to make you fall asleep (general anesthetic).  A thin, tube-shaped instrument with a light and tiny camera at the end (cystoscope) will be inserted into your urethra. The urethra is the tube that drains urine from the bladder  out of the body. In men, the urethra opens at the end of the penis. In women, the urethra opens in front of the vaginal opening.  The cystoscope will be passed into your bladder.  A thin wire (guide wire) will be passed through your bladder and into your ureter. This is used to guide the stent into your ureter.  The stent will be inserted into your ureter.  The guide wire and the cystoscope will be removed.  A flexible tube (catheter) may be inserted through your urethra so that one end is in your bladder. This helps to drain urine from your bladder. The procedure may vary among hospitals and health care providers. What happens after the procedure?  Your blood pressure, heart rate, breathing rate, and blood oxygen level will be monitored until you leave the hospital or clinic.  You may continue to receive medicine and fluids through an IV.  You may have some soreness or pain in your abdomen and urethra. Medicines will be available to help you.  You will be encouraged to get up and walk around as soon as you can.  You may have a catheter draining your urine.  You will have some blood in your urine.  Do not drive for 24 hours if you were given a sedative during your procedure. Summary  Ureteral stent implantation is a procedure to insert a flexible, soft, plastic tube (stent) into a ureter.  You may have a stent implanted to support the ureter while it heals after a procedure or to open the flow of urine if there is a blockage.  Follow instructions from your health care provider about taking medicines and about eating and drinking before the procedure.  Depending on your condition, you may have a stent for just a few weeks, or you may have a long-term stent that will need to be replaced every few months. This information is  not intended to replace advice given to you by your health care provider. Make sure you discuss any questions you have with your health care provider. Document  Released: 01/06/2000 Document Revised: 10/15/2017 Document Reviewed: 10/16/2017 Elsevier Patient Education  2020 [employer].

## 2018-12-30 NOTE — Telephone Encounter (Signed)
Sent to pharmacy to comment on how long Eliquis should be held.  Kerin Ransom PA-C 12/30/2018 1:28 PM

## 2018-12-30 NOTE — Telephone Encounter (Signed)
New Message        [facility] Pre-operative Risk Assessment    Request for surgical clearance:  1. What type of surgery is being performed? Right Ureteroscopy, laser lithotripsy, Ureteral Stent placement, Right Ureteral stone   2. When is this surgery scheduled? 01/01/19   3. What type of clearance is required (medical clearance vs. Pharmacy clearance to hold med vs. Both)? Both   4. Are there any medications that need to be held prior to surgery and how long? Eliquis stopped now - Through Friday   5. Practice name and name of physician performing surgery? [facility]   6. What is your office phone number [phone]   7.   What is your office fax number [phone]  8.   Anesthesia type (None, local, MAC, general) ? General    Marca Ancona 12/30/2018, 11:39 AM  _________________________________________________________________   (provider comments below)

## 2018-12-31 ENCOUNTER — Other Ambulatory Visit: Payer: Self-pay

## 2018-12-31 ENCOUNTER — Encounter
Admission: RE | Admit: 2018-12-31 | Discharge: 2018-12-31 | Disposition: A | Discharge status: Home / Self Care | Payer: Medicare Other | Source: Ambulatory Visit | Attending: Urology | Admitting: Urology

## 2018-12-31 ENCOUNTER — Other Ambulatory Visit: Payer: Self-pay | Admitting: Radiology

## 2018-12-31 DIAGNOSIS — N200 Calculus of kidney: Secondary | ICD-10-CM

## 2018-12-31 DIAGNOSIS — Z20828 Contact with and (suspected) exposure to other viral communicable diseases: Secondary | ICD-10-CM | POA: Diagnosis not present

## 2018-12-31 DIAGNOSIS — Z01818 Encounter for other preprocedural examination: Secondary | ICD-10-CM | POA: Diagnosis not present

## 2018-12-31 DIAGNOSIS — R399 Unspecified symptoms and signs involving the genitourinary system: Secondary | ICD-10-CM | POA: Insufficient documentation

## 2018-12-31 DIAGNOSIS — I10 Essential (primary) hypertension: Secondary | ICD-10-CM | POA: Diagnosis not present

## 2018-12-31 DIAGNOSIS — R9431 Abnormal electrocardiogram [ECG] [EKG]: Secondary | ICD-10-CM | POA: Diagnosis not present

## 2018-12-31 HISTORY — DX: Presence of cardiac pacemaker: Z95.0

## 2018-12-31 HISTORY — DX: Chronic obstructive pulmonary disease, unspecified: J44.9

## 2018-12-31 MED ORDER — CIPROFLOXACIN IN D5W 400 MG/200ML IV SOLN
400.0000 mg | Freq: Once | INTRAVENOUS | Status: AC
  Administered 2019-01-01: 400 mg via INTRAVENOUS

## 2018-12-31 NOTE — Patient Instructions (Signed)
Your procedure is scheduled on: 01/01/19  Report to [place] at 2:30 To find out your arrival time please call [phone] between 1PM - 3PM on today.  Remember: Instructions that are not followed completely may result in serious medical risk, up to and including death, or upon the discretion of your surgeon and anesthesiologist your surgery may need to be rescheduled.     _X__ 1. Do not eat food after midnight the night before your procedure.                 No gum chewing or hard candies. You may drink clear liquids up to 2 hours                 before you are scheduled to arrive for your surgery- DO not drink clear                 liquids within 2 hours of the start of your surgery.                 Clear Liquids include:  water, apple juice without pulp, clear carbohydrate                 drink such as Clearfast or Gatorade, Black Coffee or Tea (Do not add                 anything to coffee or tea). Diabetics water only  __X__2.  On the morning of surgery brush your teeth with toothpaste and water, you                 may rinse your mouth with mouthwash if you wish.  Do not swallow any              toothpaste of mouthwash.     _X__ 3.  No Alcohol for 24 hours before or after surgery.   _X__ 4.  Do Not Smoke or use e-cigarettes For 24 Hours Prior to Your Surgery.                 Do not use any chewable tobacco products for at least 6 hours prior to                 surgery.  ____  5.  Bring all medications with you on the day of surgery if instructed.   __X__  6.  Notify your doctor if there is any change in your medical condition      (cold, fever, infections).     Do not wear jewelry, make-up, hairpins, clips or nail polish. Do not wear lotions, powders, or perfumes.  Do not shave 48 hours prior to surgery. Men may shave face and neck. Do not bring valuables to the hospital.    [facility] is not responsible for any  belongings or valuables.  Contacts, dentures/partials or body piercings may not be worn into surgery. Bring a case for your contacts, glasses or hearing aids, a denture cup will be supplied. Leave your suitcase in the car. After surgery it may be brought to your room. For patients admitted to the hospital, discharge time is determined by your treatment team.   Patients discharged the day of surgery will not be allowed to drive home.   Please read over the following fact sheets that you were given:   MRSA Information  __X__ Take these medicines the morning of surgery with A SIP OF  WATER:    1. amLODipine (NORVASC) 5 MG tablet  2. atenolol (TENORMIN) 25 MG tablet  3. tamsulosin (FLOMAX) 0.4 MG CAPS capsule  4.  5.  6.  ____ Fleet Enema (as directed)   ____ Use CHG Soap/SAGE wipes as directed  __X__ Use inhalers on the day of surgery  ____ Stop metformin/Janumet/Farxiga 2 days prior to surgery    ____ Take 1/2 of usual insulin dose the night before surgery. No insulin the morning          of surgery.   __X__ Stop Blood Thinners Coumadin/Plavix/Xarelto/Pleta/Pradaxa/Eliquis/Effient/Aspirin  on   Or contact your Surgeon, Cardiologist or Medical Doctor regarding  ability to stop your blood thinners (already stopped)  __X__ Stop Anti-inflammatories 7 days before surgery such as Advil, Ibuprofen, Motrin,  BC or Goodies Powder, Naprosyn, Naproxen, Aleve, Aspirin    __X__ Stop all herbal supplements, fish oil or vitamin E until after surgery.    ____ Bring C-Pap to the hospital.

## 2018-12-31 NOTE — Telephone Encounter (Signed)
Pre admit calling to discuss patient clearance .  Please call [facility]  preadmit [phone].

## 2018-12-31 NOTE — Telephone Encounter (Signed)
   Primary Cardiologist: Ida Rogue, MD  Chart reviewed and patient contacted today as part of pre-operative protocol coverage. We felt it would be best for the patient to be seen in the office for pre op clearance but she would like to go ahead with her surgery which is scheduled for tomorrow.  She denies any recent chest pain or unusual SOB. She does not have a history of CAD or MI.  She did see her PCP yesterday for clearance.  She has held her Eliquis 24 hours.  Based on ACC/AHA guidelines, Jessica [name] would be at acceptable risk for the planned procedure without further cardiovascular testing.   OK to hold Eliquis 2 days pre op.   I will route this recommendation to the requesting party via Epic fax function and remove from pre-op pool.  Please call with questions.  Kerin Ransom, PA-C 12/31/2018, 3:20 PM

## 2018-12-31 NOTE — Telephone Encounter (Signed)
Alternate number [phone]

## 2019-01-01 ENCOUNTER — Ambulatory Visit: Payer: Medicare Other | Admitting: Anesthesiology

## 2019-01-01 ENCOUNTER — Encounter: Payer: Self-pay | Admitting: Urology

## 2019-01-01 ENCOUNTER — Ambulatory Visit: Payer: Medicare Other

## 2019-01-01 ENCOUNTER — Encounter
Admission: RE | Disposition: A | Discharge status: Home / Self Care | Payer: Self-pay | Source: Home / Self Care | Attending: Urology

## 2019-01-01 ENCOUNTER — Ambulatory Visit
Admission: RE | Admit: 2019-01-01 | Discharge: 2019-01-01 | Disposition: A | Discharge status: Home / Self Care | Payer: Medicare Other | Attending: Urology | Admitting: Urology

## 2019-01-01 ENCOUNTER — Other Ambulatory Visit: Payer: Self-pay

## 2019-01-01 DIAGNOSIS — Z7902 Long term (current) use of antithrombotics/antiplatelets: Secondary | ICD-10-CM | POA: Diagnosis not present

## 2019-01-01 DIAGNOSIS — N132 Hydronephrosis with renal and ureteral calculous obstruction: Secondary | ICD-10-CM | POA: Insufficient documentation

## 2019-01-01 DIAGNOSIS — N201 Calculus of ureter: Secondary | ICD-10-CM | POA: Diagnosis not present

## 2019-01-01 DIAGNOSIS — Z888 Allergy status to other drugs, medicaments and biological substances status: Secondary | ICD-10-CM | POA: Diagnosis not present

## 2019-01-01 DIAGNOSIS — C651 Malignant neoplasm of right renal pelvis: Secondary | ICD-10-CM | POA: Insufficient documentation

## 2019-01-01 DIAGNOSIS — N200 Calculus of kidney: Secondary | ICD-10-CM

## 2019-01-01 DIAGNOSIS — N261 Atrophy of kidney (terminal): Secondary | ICD-10-CM | POA: Diagnosis not present

## 2019-01-01 DIAGNOSIS — Z950 Presence of cardiac pacemaker: Secondary | ICD-10-CM | POA: Diagnosis not present

## 2019-01-01 DIAGNOSIS — Z7901 Long term (current) use of anticoagulants: Secondary | ICD-10-CM | POA: Diagnosis not present

## 2019-01-01 DIAGNOSIS — J449 Chronic obstructive pulmonary disease, unspecified: Secondary | ICD-10-CM | POA: Diagnosis not present

## 2019-01-01 DIAGNOSIS — E785 Hyperlipidemia, unspecified: Secondary | ICD-10-CM | POA: Insufficient documentation

## 2019-01-01 DIAGNOSIS — Z87891 Personal history of nicotine dependence: Secondary | ICD-10-CM | POA: Insufficient documentation

## 2019-01-01 DIAGNOSIS — Z885 Allergy status to narcotic agent status: Secondary | ICD-10-CM | POA: Insufficient documentation

## 2019-01-01 DIAGNOSIS — D3011 Benign neoplasm of right renal pelvis: Secondary | ICD-10-CM | POA: Diagnosis not present

## 2019-01-01 DIAGNOSIS — I739 Peripheral vascular disease, unspecified: Secondary | ICD-10-CM | POA: Diagnosis not present

## 2019-01-01 DIAGNOSIS — I4891 Unspecified atrial fibrillation: Secondary | ICD-10-CM | POA: Diagnosis not present

## 2019-01-01 DIAGNOSIS — R310 Gross hematuria: Secondary | ICD-10-CM | POA: Diagnosis not present

## 2019-01-01 DIAGNOSIS — I10 Essential (primary) hypertension: Secondary | ICD-10-CM | POA: Diagnosis not present

## 2019-01-01 DIAGNOSIS — D4111 Neoplasm of uncertain behavior of right renal pelvis: Secondary | ICD-10-CM | POA: Diagnosis not present

## 2019-01-01 HISTORY — PX: CYSTOSCOPY/URETEROSCOPY/HOLMIUM LASER/STENT PLACEMENT: SHX6546

## 2019-01-01 LAB — SARS CORONAVIRUS 2 (TAT 6-24 HRS): SARS Coronavirus 2: NEGATIVE

## 2019-01-01 SURGERY — CYSTOSCOPY/URETEROSCOPY/HOLMIUM LASER/STENT PLACEMENT
Anesthesia: General | Site: Ureter | Laterality: Right

## 2019-01-01 MED ORDER — IOHEXOL 180 MG/ML  SOLN
INTRAMUSCULAR | Status: DC | PRN
  Administered 2019-01-01: 25 mL

## 2019-01-01 MED ORDER — MIDAZOLAM HCL 2 MG/2ML IJ SOLN
INTRAMUSCULAR | Status: AC
  Filled 2019-01-01: qty 2

## 2019-01-01 MED ORDER — ONDANSETRON HCL 4 MG/2ML IJ SOLN: 4.0000 mg | Freq: Once | INTRAMUSCULAR | Status: DC | PRN

## 2019-01-01 MED ORDER — SUGAMMADEX SODIUM 500 MG/5ML IV SOLN
INTRAVENOUS | Status: AC
  Filled 2019-01-01: qty 5

## 2019-01-01 MED ORDER — SUGAMMADEX SODIUM 200 MG/2ML IV SOLN
INTRAVENOUS | Status: DC | PRN
  Administered 2019-01-01: 250 mg via INTRAVENOUS

## 2019-01-01 MED ORDER — FENTANYL CITRATE (PF) 100 MCG/2ML IJ SOLN: 25.0000 ug | INTRAMUSCULAR | Status: DC | PRN

## 2019-01-01 MED ORDER — ONDANSETRON HCL 4 MG/2ML IJ SOLN
INTRAMUSCULAR | Status: DC | PRN
  Administered 2019-01-01: 4 mg via INTRAVENOUS

## 2019-01-01 MED ORDER — DEXAMETHASONE SODIUM PHOSPHATE 10 MG/ML IJ SOLN
INTRAMUSCULAR | Status: DC | PRN
  Administered 2019-01-01: 10 mg via INTRAVENOUS

## 2019-01-01 MED ORDER — PHENYLEPHRINE HCL (PRESSORS) 10 MG/ML IV SOLN
INTRAVENOUS | Status: DC | PRN
  Administered 2019-01-01 (×4): 100 ug via INTRAVENOUS

## 2019-01-01 MED ORDER — FENTANYL CITRATE (PF) 100 MCG/2ML IJ SOLN
INTRAMUSCULAR | Status: DC | PRN
  Administered 2019-01-01: 25 ug via INTRAVENOUS

## 2019-01-01 MED ORDER — CIPROFLOXACIN IN D5W 400 MG/200ML IV SOLN
INTRAVENOUS | Status: AC
  Filled 2019-01-01: qty 200

## 2019-01-01 MED ORDER — ROCURONIUM BROMIDE 100 MG/10ML IV SOLN
INTRAVENOUS | Status: DC | PRN
  Administered 2019-01-01: 30 mg via INTRAVENOUS
  Administered 2019-01-01: 10 mg via INTRAVENOUS

## 2019-01-01 MED ORDER — SEVOFLURANE IN SOLN
RESPIRATORY_TRACT | Status: AC
  Filled 2019-01-01: qty 250

## 2019-01-01 MED ORDER — ACETAMINOPHEN 325 MG PO TABS: 325.0000 mg | ORAL_TABLET | ORAL | Status: DC | PRN

## 2019-01-01 MED ORDER — LIDOCAINE HCL (CARDIAC) PF 100 MG/5ML IV SOSY
PREFILLED_SYRINGE | INTRAVENOUS | Status: DC | PRN
  Administered 2019-01-01: 60 mg via INTRAVENOUS

## 2019-01-01 MED ORDER — LACTATED RINGERS IV SOLN
INTRAVENOUS | Status: DC
  Administered 2019-01-01: 15:00:00 via INTRAVENOUS

## 2019-01-01 MED ORDER — FAMOTIDINE 20 MG PO TABS
ORAL_TABLET | ORAL | Status: AC
  Administered 2019-01-01: 20 mg via ORAL
  Filled 2019-01-01: qty 1

## 2019-01-01 MED ORDER — FENTANYL CITRATE (PF) 100 MCG/2ML IJ SOLN
INTRAMUSCULAR | Status: AC
  Filled 2019-01-01: qty 2

## 2019-01-01 MED ORDER — PROPOFOL 10 MG/ML IV BOLUS
INTRAVENOUS | Status: DC | PRN
  Administered 2019-01-01: 130 mg via INTRAVENOUS

## 2019-01-01 MED ORDER — FAMOTIDINE 20 MG PO TABS: 20.0000 mg | ORAL_TABLET | Freq: Once | ORAL | Status: AC

## 2019-01-01 MED ORDER — ACETAMINOPHEN 160 MG/5ML PO SOLN
325.0000 mg | ORAL | Status: DC | PRN
  Filled 2019-01-01: qty 20.3

## 2019-01-01 MED ORDER — PROPOFOL 10 MG/ML IV BOLUS
INTRAVENOUS | Status: AC
  Filled 2019-01-01: qty 20

## 2019-01-01 SURGICAL SUPPLY — 33 items
BAG DRAIN CYSTO-URO LG1000N (MISCELLANEOUS) ×2 IMPLANT
BASKET ZERO TIP 1.9FR (BASKET) ×2 IMPLANT
BRUSH SCRUB EZ 1% IODOPHOR (MISCELLANEOUS) ×2 IMPLANT
CATH URETL 5X70 OPEN END (CATHETERS) IMPLANT
CNTNR SPEC 2.5X3XGRAD LEK (MISCELLANEOUS) ×1
CONT SPEC 4OZ STER OR WHT (MISCELLANEOUS) ×1
CONTAINER SPEC 2.5X3XGRAD LEK (MISCELLANEOUS) ×1 IMPLANT
DRAPE UTILITY 15X26 TOWEL STRL (DRAPES) ×2 IMPLANT
DRSG TELFA 4X3 1S NADH ST (GAUZE/BANDAGES/DRESSINGS) ×2 IMPLANT
FIBER LASER TRAC TIP (UROLOGICAL SUPPLIES) IMPLANT
FIBER LASER TRACTIP 200 (UROLOGICAL SUPPLIES) ×2 IMPLANT
FORCEPS BIOP PIRANHA Y (CUTTING FORCEPS) ×2 IMPLANT
GLOVE BIOGEL PI IND STRL 7.5 (GLOVE) ×1 IMPLANT
GLOVE BIOGEL PI INDICATOR 7.5 (GLOVE) ×1
GOWN STRL REUS W/ TWL LRG LVL3 (GOWN DISPOSABLE) ×1 IMPLANT
GOWN STRL REUS W/ TWL XL LVL3 (GOWN DISPOSABLE) ×1 IMPLANT
GOWN STRL REUS W/TWL LRG LVL3 (GOWN DISPOSABLE) ×1
GOWN STRL REUS W/TWL XL LVL3 (GOWN DISPOSABLE) ×1
GUIDEWIRE STR DUAL SENSOR (WIRE) ×2 IMPLANT
INFUSOR MANOMETER BAG 3000ML (MISCELLANEOUS) ×2 IMPLANT
INTRODUCER DILATOR DOUBLE (INTRODUCER) ×2 IMPLANT
KIT TURNOVER CYSTO (KITS) ×2 IMPLANT
PACK CYSTO AR (MISCELLANEOUS) ×2 IMPLANT
SET CYSTO W/LG BORE CLAMP LF (SET/KITS/TRAYS/PACK) ×2 IMPLANT
SHEATH URETERAL 12FRX35CM (MISCELLANEOUS) ×2 IMPLANT
SOL .9 NS 3000ML IRR  AL (IV SOLUTION) ×1
SOL .9 NS 3000ML IRR UROMATIC (IV SOLUTION) ×1 IMPLANT
STENT URET 6FRX24 CONTOUR (STENTS) IMPLANT
STENT URET 6FRX26 CONTOUR (STENTS) ×2 IMPLANT
SURGILUBE 2OZ TUBE FLIPTOP (MISCELLANEOUS) ×2 IMPLANT
SYR 10ML LL (SYRINGE) ×2 IMPLANT
VALVE UROSEAL ADJ ENDO (VALVE) ×2 IMPLANT
WATER STERILE IRR 1000ML POUR (IV SOLUTION) ×2 IMPLANT

## 2019-01-01 NOTE — Transfer of Care (Signed)
Immediate Anesthesia Transfer of Care Note  Patient: Jessica [name]  Procedure(s) Performed: Procedure(s): CYSTOSCOPY/URETEROSCOPY/HOLMIUM LASER/STENT PLACEMENT (Right)  Patient Location: PACU  Anesthesia Type:General  Level of Consciousness: sedated  Airway & Oxygen Therapy: Patient Spontanous Breathing and Patient connected to face mask oxygen  Post-op Assessment: Report given to RN and Post -op Vital signs reviewed and stable  Post vital signs: Reviewed and stable  Last Vitals:  Vitals:   01/01/19 1423 01/01/19 1656  BP: (!) 157/78 (!) 147/71  Pulse: 70 70  Resp: 16 18  Temp: 36.4 C 36.6 C  SpO2: 27% 517%    Complications: No apparent anesthesia complications

## 2019-01-01 NOTE — Anesthesia Preprocedure Evaluation (Addendum)
Anesthesia Evaluation  Patient identified by MRN, date of birth, ID band Patient awake    Reviewed: Allergy & Precautions, H&P , NPO status , reviewed documented beta blocker date and time   Airway Mallampati: II  TM Distance: >3 FB Neck ROM: limited    Dental  (+) Edentulous Upper, Missing, Chipped, Upper Dentures   Pulmonary shortness of breath, COPD,  COPD inhaler, Not current smoker, former smoker,    Pulmonary exam normal        Cardiovascular hypertension, + Peripheral Vascular Disease  Normal cardiovascular exam+ dysrhythmias Atrial Fibrillation + pacemaker   08/2016 ECHO Technically difficult study due to chest wall and/or lung   interference. - Left ventricle: The cavity size was normal. Wall thickness was   increased in a pattern of moderate LVH. Systolic function was   normal. The estimated ejection fraction was in the range of 60%   to 65%. Regional wall motion abnormalities cannot be excluded.   The study is not technically sufficient to allow evaluation of LV   diastolic function. - Aortic valve: Poorly visualized. Moderately thickened, moderately   calcified leaflets. Sclerosis without stenosis. - Left atrium: The atrium was mildly dilated. - Right ventricle: The cavity size was normal. Pacer wire or   catheter noted in right ventricle. - Atrial septum: There was increased thickness of the septum,   consistent with lipomatous hypertrophy. - Tricuspid valve: There was mild regurgitation. - Pulmonary arteries: Systolic pressure was mildly to moderately   increased, in the range of 40 mm Hg to 45 mm Hg.   Neuro/Psych PSYCHIATRIC DISORDERS Depression    GI/Hepatic neg GERD  ,  Endo/Other    Renal/GU Renal disease     Musculoskeletal   Abdominal   Peds  Hematology   Anesthesia Other Findings Past Medical History: No date: AAA (abdominal aortic aneurysm) (HCC) No date: Abdominal aneurysm without  mention of rupture No date: Atrioventricular block, complete (HCC) No date: Cardiac pacemaker in situ No date: COPD (chronic obstructive pulmonary disease) (HCC) No date: Gout No date: Hernia No date: Hyperlipidemia No date: Hypertension No date: Kidney stone No date: Presence of permanent cardiac pacemaker No date: Rectal bleeding Past Surgical History: No date: ABDOMINAL AORTIC ANEURYSM REPAIR     Comment:  son denies No date: HERNIA REPAIR No date: ohter     Comment:  growth on colon surgery 02/25/2017: PPM GENERATOR CHANGEOUT; N/A     Comment:  Procedure: PPM GENERATOR CHANGEOUT;  Surgeon: Deboraha Sprang, MD;  Location: [facility];  Service:               Cardiovascular;  Laterality: N/A; 12/03/2017: RENAL ANGIOGRAPHY; Right     Comment:  Procedure: RENAL ANGIOGRAPHY;  Surgeon: Katha Cabal, MD;  Location: [facility];  Service:               Cardiovascular;  Laterality: Right; No date: TONSILLECTOMY   Reproductive/Obstetrics                            Anesthesia Physical Anesthesia Plan  ASA: III  Anesthesia Plan: General   Post-op Pain Management:    Induction: Intravenous  PONV Risk Score and Plan: Ondansetron and Treatment may vary due to age or medical condition  Airway  Management Planned: LMA  Additional Equipment:   Intra-op Plan:   Post-operative Plan: Extubation in OR  Informed Consent: I have reviewed the patients History and Physical, chart, labs and discussed the procedure including the risks, benefits and alternatives for the proposed anesthesia with the patient or authorized representative who has indicated his/her understanding and acceptance.     Dental Advisory Given  Plan Discussed with: CRNA  Anesthesia Plan Comments:         Anesthesia Quick Evaluation

## 2019-01-01 NOTE — Op Note (Signed)
Date of procedure: 01/01/19  Preoperative diagnosis:  1. Right 5 mm proximal ureteral stone 2. Right urothelial mass  Postoperative diagnosis:  1. Same  Procedure: 1. Cystoscopy, right retrograde pyelogram with intraoperative interpretation, right ureteroscopy, laser lithotripsy and basket extraction, right renal pelvis biopsy and laser ablation  Surgeon: Nickolas Madrid, MD  Anesthesia: General  Complications: None  Intraoperative findings:  1.  Normal cystoscopy, ureteral orifices orthotopic bilaterally 2.  Right retrograde pyelogram with filling defects in proximal ureter consistent with ureteral stone, upstream hydronephrosis, and filling defect in the lower pole consistent with tumor 3.  Uncomplicated laser lithotripsy of right ureteral and renal stones and basket extraction 4.  Piranha biopsy of right lower pole urothelial tumor, and laser hemostasis of biopsy site  EBL: Minimal  Specimens:  1.  Right urine cytology 2.  Right renal pelvis biopsy 3.  Stone for analysis  Drains: Right 6 French by 24 cm ureteral stent  Indication: Jessica [name] is a 85 y.o. 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  She refused any surgery or intervention and was lost to follow-up.  She represented last week with a 5 mm proximal right ureteral stone with upstream obstruction and gross hematuria.  In the setting of her atrophic left kidney, she elected to undergo right ureteroscopy, laser lithotripsy for management of her stone, possible biopsy of her upper tract tumor for diagnosis, and stent placement.  After reviewing the management options for treatment, they elected to proceed with the above surgical procedure(s). We have discussed the potential benefits and risks of the procedure, side effects of the proposed treatment, the  likelihood of the patient achieving the goals of the procedure, and any potential problems that might occur during the procedure or recuperation. Informed consent has been obtained.  Description of procedure:  The patient was taken to the operating room and general anesthesia was induced. SCDs were placed for DVT prophylaxis.The patient was placed in the dorsal lithotomy position, prepped and draped in the usual sterile fashion, and preoperative antibiotics were administered. A preoperative time-out was performed.   A 21 French rigid cystoscope was used to intubate the urethra and thorough cystoscopy was performed.  The bladder was grossly normal, and the ureteral orifices were orthotopic bilaterally.  There were no abnormal bladder lesions.  A right retrograde pyelogram was performed and showed a filling defect in the proximal ureter with upstream hydronephrosis consistent with her known proximal ureteral stone, as well as a filling defect in the lower pole consistent with her prior CT urogram worrisome for an upper tract urothelial cell carcinoma.  A sensor wire was advanced into the upper pole under fluoroscopic vision, and a dual-lumen access catheter was used to add a second safety wire.  A 12/14 French x 35cm ureteral access sheath was gently advanced over the wire under fluoroscopic vision and passed easily up into the proximal ureter.  The single-channel digital flexible ureteroscope was advanced through the sheath and the proximal ureteral stone had been knocked back into the renal pelvis by the dual-lumen access catheter.  The sheath was gently advanced into the renal pelvis.  Thorough pyeloscopy was performed and demonstrated the 5 mm stone consistent with her prior CT, a few scattered small 2 to 3 mm stones, and extensive papillary tumor in the lower and mid pole worrisome for low-grade appearing upper tract  urothelial cell carcinoma.  Directed cytology was sent.  A 200 m laser fiber was  advanced through the scope and used to break the stones into extractable fragments 2 to 3 mm in size.  A flexible basket was then used to remove all stones from the kidney and these were sent for analysis.  Thorough pyeloscopy revealed no residual stones.  A Piranha biopsy forcep was then used to take 2 biopsies of the large lower pole papillary tumor.  On settings of 0.5 J and 20 Hz, the laser was used to carefully ablate the biopsy sites until there was excellent hemostasis.  I did not feel that there would be any real benefit to the patient from prolonged laser ablation of her high volume low-grade appearing tumor, and in addition it would put her at high risk for recurrent bleeding with her need for anticoagulation.  Thorough pyeloscopy revealed no residual bleeding, all stones have been removed.  Contrast was injected through the scope to opacify the collecting system to aid in stent placement and showed no extravasation.  Careful pullback ureteroscopy revealed no ureteral fragments, ureteral injury, or damage from the sheath.  A 6 French by 24 cm stent was uneventfully placed under fluoroscopic vision with an excellent curl in the renal pelvis, as well as in the bladder.  Disposition: Stable to PACU  Plan: -Stent removal in clinic in 1 week -Discuss pathology when back -We will discuss possibility of de-escalating dual anticoagulation with Eliquis and Plavix with vascular/cardiology with her known tumor and recurrent hematuria  Nickolas Madrid, MD

## 2019-01-01 NOTE — Anesthesia Post-op Follow-up Note (Signed)
Anesthesia QCDR form completed.        

## 2019-01-01 NOTE — H&P (Signed)
01/01/19 2:27 PM   Jessica [name] 01-19-1930 [id number]   HPI: She is an 85 year old female with multiple co-morbidities including history of AAA repair, atrial fibrillation on Eliquis, atrophic left kidney, right renal artery stenosis status post vascular stent placement with Dr. Delana Meyer 12/03/2017 on plavix.  I originally saw her on 11/25/2017 to review CT urogram findings that showed a right lower pole mass/filling defect worrisome for urothelial cell carcinoma.  I recommended right ureteroscopy and biopsy at that time, however the patient and her son adamantly refused any intervention or work-up.  I had set her up for follow-up with oncology and radiation to see if there were any other less invasive treatment options, however she did not follow-up with them or me and was lost to follow-up.  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  Urine culture showed no growth, renal function was stable, and she was discharged home with medical expulsive therapy.  She has not had any flank pain, fevers, chills, or other complaints aside from the intermittent gross hematuria.  She denies any chest pain or SOB.   PMH: Past Medical History:  Diagnosis Date  . AAA (abdominal aortic aneurysm) ([place])   . Abdominal aneurysm without mention of rupture   . Atrioventricular block, complete ([place])   . Cardiac pacemaker in situ   . COPD (chronic obstructive pulmonary disease) ([place])   . Gout   . Hernia   . Hyperlipidemia   . Hypertension   . Kidney stone   . Presence of permanent cardiac pacemaker   . Rectal bleeding     Surgical History: Past Surgical History:  Procedure Laterality Date  . ABDOMINAL AORTIC ANEURYSM REPAIR     son denies  . HERNIA REPAIR     . ohter     growth on colon surgery  . PPM GENERATOR CHANGEOUT N/A 02/25/2017   Procedure: PPM GENERATOR CHANGEOUT;  Surgeon: Deboraha Sprang, MD;  Location: [facility];  Service: Cardiovascular;  Laterality: N/A;  . RENAL ANGIOGRAPHY Right 12/03/2017   Procedure: RENAL ANGIOGRAPHY;  Surgeon: Katha Cabal, MD;  Location: [facility];  Service: Cardiovascular;  Laterality: Right;  . TONSILLECTOMY      Allergies:  Allergies  Allergen Reactions  . Codeine Other (See Comments)    Made her feel crazy  . Morphine Other (See Comments)    Made her feel crazy  . Zyrtec [Cetirizine] Other (See Comments)    Causes facial numbness    Family History: Family History  Problem Relation Age of Onset  . Heart disease Father   . Alcohol abuse Father   . Cancer Brother   . Other Mother 91       MVA    Social History:  reports that she quit smoking about 23 years ago. Her smoking use included cigarettes. She has a 75.00 pack-year smoking history. She has never used smokeless tobacco. She reports previous alcohol use of about 1.0 standard drinks of alcohol per week. She reports that she does not use drugs.  ROS: Please see flowsheet from today's date for complete review of systems.  Physical Exam: BP (!) 157/78   Pulse 70   Temp 97.6 F (36.4 C) (Tympanic)   Resp 16  SpO2 99%    Constitutional:  Alert and oriented, No acute distress. Cardiovascular: Regular rate and rhythm Respiratory: Clear to auscultation bilaterally GI: Abdomen is soft, nontender, nondistended, no abdominal masses GU: No CVA tenderness Lymph: No cervical or inguinal lymphadenopathy. Skin: No rashes, bruises or suspicious lesions. Neurologic: Grossly intact, no focal deficits, moving all 4 extremities. Psychiatric: Normal mood and affect.  Laboratory Data: Urine culture 12/3: <10k insignificant growth  Pertinent Imaging: Reviewed, see HPI  Assessment & Plan:   In summary, she is an  85 year old extremely comorbid female with a likely right-sided upper tract urothelial cell carcinoma as well as a right 5 mm proximal ureteral stone and hydronephrosis with a severely atrophic left kidney.  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  She has a very complex presentation.  I strongly recommended proceeding with ureteroscopy, laser lithotripsy, possible biopsy of lower pole mass/tumor, and stent placement.  We discussed the risks and benefits at length.  On my review of the CT, there is a possible punctate left distal ureteral stone that appears stable from prior imaging, with no hydronephrosis.     We specifically discussed the risks ureteroscopy including bleeding, infection/sepsis, stent related symptoms including flank pain/urgency/frequency/incontinence/dysuria, ureteral injury, inability to access stone, or need for staged or additional procedures.  Cystoscopy, bilateral retrograde pyelograms, right ureteroscopy, laser lithotripsy, stent placement, biopsy of right urothelial mass  Billey Co, MD  [facility] [address], [place], [place] [address] [phone]

## 2019-01-01 NOTE — Anesthesia Procedure Notes (Signed)

## 2019-01-01 NOTE — Anesthesia Postprocedure Evaluation (Signed)
Anesthesia Post Note  Patient: Jessica [name]  Procedure(s) Performed: CYSTOSCOPY/URETEROSCOPY/HOLMIUM LASER/STENT PLACEMENT (Right Ureter)  Patient location during evaluation: PACU Anesthesia Type: General Level of consciousness: awake and alert Pain management: pain level controlled Vital Signs Assessment: post-procedure vital signs reviewed and stable Respiratory status: spontaneous breathing and respiratory function stable Cardiovascular status: stable Anesthetic complications: no     Last Vitals:  Vitals:   01/01/19 1656 01/01/19 1703  BP: (!) 147/71   Pulse: 70 70  Resp: 18 18  Temp: 36.6 C   SpO2: 100% 100%    Last Pain:  Vitals:   01/01/19 1656  TempSrc:   PainSc: 0-No pain                 Quadarius Henton K

## 2019-01-01 NOTE — Discharge Instructions (Signed)

## 2019-01-02 LAB — URINE CULTURE: Culture: NO GROWTH

## 2019-01-05 LAB — SURGICAL PATHOLOGY

## 2019-01-05 LAB — CYTOLOGY - NON PAP

## 2019-01-06 ENCOUNTER — Other Ambulatory Visit: Payer: Self-pay | Admitting: Internal Medicine

## 2019-01-06 NOTE — Telephone Encounter (Signed)
26f 56.2kg Scr 1.43 12/25/18 Lovw/klein 06/30/18

## 2019-01-08 ENCOUNTER — Ambulatory Visit: Payer: Medicare Other | Admitting: Urology

## 2019-01-08 ENCOUNTER — Other Ambulatory Visit: Payer: Self-pay

## 2019-01-08 ENCOUNTER — Encounter: Payer: Self-pay | Admitting: Urology

## 2019-01-08 VITALS — BP 168/70 | HR 70 | Ht 65.0 in | Wt 120.0 lb

## 2019-01-08 DIAGNOSIS — R310 Gross hematuria: Secondary | ICD-10-CM

## 2019-01-08 DIAGNOSIS — N200 Calculus of kidney: Secondary | ICD-10-CM | POA: Diagnosis not present

## 2019-01-08 DIAGNOSIS — C689 Malignant neoplasm of urinary organ, unspecified: Secondary | ICD-10-CM | POA: Diagnosis not present

## 2019-01-08 LAB — URINALYSIS, COMPLETE
Bilirubin, UA: NEGATIVE
Nitrite, UA: POSITIVE — AB
Specific Gravity, UA: 1.015 (ref 1.005–1.030)
Urobilinogen, Ur: 1 mg/dL (ref 0.2–1.0)
pH, UA: 6 (ref 5.0–7.5)

## 2019-01-08 LAB — MICROSCOPIC EXAMINATION: RBC, Urine: >30 /hpf — AB (ref 0–2)

## 2019-01-08 MED ORDER — SULFAMETHOXAZOLE-TRIMETHOPRIM 800-160 MG PO TABS: 1.0000 | ORAL_TABLET | Freq: Two times a day (BID) | ORAL | 0 refills | Status: DC

## 2019-01-08 NOTE — Progress Notes (Signed)
   01/08/2019 1:29 PM   Jessica [name] 1929-12-20 [id number]  Reason for visit: Follow up ureteroscopy, laser lithotripsy of stone, biopsy of upper tract urothelial tumor, and stent placement  HPI: I saw [name] in urology clinic today for follow-up.  She was scheduled to undergo stent removal today, but urinalysis is concerning for infection with many bacteria, >30 WBCs, and nitrite positive.   She denies any flank pain, however per her son she had some confusion for a few days after surgery that he attributes to the anesthesia.  She denies any urinary problems or pain.  On 01/01/2019 she underwent cystoscopy, right retrograde pyelogram, right ureteroscopy, laser lithotripsy and basket extraction of a 6 mm UPJ stone, and right renal pelvis biopsy of papillary appearing tumor.  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  She has known about this likely diagnosis for at least 1 year when we discussed her CT urogram on 11/25/2017 and she had refused any further intervention.  We discussed treatment options again at this time including nephroureterectomy with likely need for dialysis with her atrophic left kidney, repeat ureteroscopy and laser ablation, or observation.  We discussed the risks and benefits at length, and both the patient and her son are adamant that they do not want any further intervention and would like to pursue observation alone at this time.  I discussed the risks of obstruction, metastatic disease, and most importantly bleeding on her dual anticoagulation.  I will reach out to her cardiologist and vascular surgeon to see if we can de-escalate some of her anticoagulation to minimize her risk of ongoing hematuria.  Bactrim antibiotics today for UTI, urine sent for culture Re-schedule stent removal for 1 week Follow-up in 4 months with renal ultrasound to evaluate for hydronephrosis/progression of disease  A total of 25 minutes were spent  face-to-face with the patient, greater than 50% was spent in patient education, counseling, and coordination of care regarding low-grade urothelial cell carcinoma.  Billey Co, [facility] [address] [name], [place] [address] [phone]

## 2019-01-09 LAB — CALCULI, WITH PHOTOGRAPH (CLINICAL LAB)
Calcium Oxalate Dihydrate: 80 %
Calcium Oxalate Monohydrate: 20 %
Weight Calculi: 36 mg

## 2019-01-10 LAB — CULTURE, URINE COMPREHENSIVE

## 2019-01-14 ENCOUNTER — Ambulatory Visit (INDEPENDENT_AMBULATORY_CARE_PROVIDER_SITE_OTHER): Payer: Medicare Other | Admitting: Family Medicine

## 2019-01-14 ENCOUNTER — Encounter: Payer: Self-pay | Admitting: Family Medicine

## 2019-01-14 DIAGNOSIS — C689 Malignant neoplasm of urinary organ, unspecified: Secondary | ICD-10-CM | POA: Diagnosis not present

## 2019-01-14 DIAGNOSIS — I4821 Permanent atrial fibrillation: Secondary | ICD-10-CM | POA: Diagnosis not present

## 2019-01-14 DIAGNOSIS — I10 Essential (primary) hypertension: Secondary | ICD-10-CM | POA: Diagnosis not present

## 2019-01-14 DIAGNOSIS — N200 Calculus of kidney: Secondary | ICD-10-CM | POA: Diagnosis not present

## 2019-01-14 DIAGNOSIS — W19XXXA Unspecified fall, initial encounter: Secondary | ICD-10-CM | POA: Diagnosis not present

## 2019-01-14 NOTE — Progress Notes (Signed)
Virtual Visit via video Note  This visit type was conducted due to national recommendations for restrictions regarding the COVID-19 pandemic (e.g. social distancing).  This format is felt to be most appropriate for this patient at this time.  All issues noted in this document were discussed and addressed.  No physical exam was performed (except for noted visual exam findings with Video Visits).   I connected with Jessica [name] today at 11:30 AM EST by a video enabled telemedicine application or telephone and verified that I am speaking with the correct person using two identifiers. Location patient: home Location provider: work Persons participating in the virtual visit: patient, provider, April Lassiter (sister in Sports coach), [name] (son)  I discussed the limitations, risks, security and privacy concerns of performing an evaluation and management service by telephone and the availability of in person appointments. I also discussed with the patient that there may be a patient responsible charge related to this service. The patient expressed understanding and agreed to proceed.   Reason for visit: follow-up  HPI: Papillary urothelial carcinoma: Patient has been evaluated by urology for this and had this diagnosed on biopsy.  They discussed possible treatment though the patient and her son opted for observation regarding this.  They will continue to see nephrology.  No weight changes recently.  Nephrolithiasis: Patient underwent stent placement and notes her stones were removed.  She had no pain.  She has a little pink tinge to her urine.  She has been on Bactrim for a UTI.  She follows up on Monday for stent removal.  Hypertension: Not checking blood pressures.  Taking amlodipine and atenolol.  No chest pain, shortness of breath, or edema.  A. fib: Taking Eliquis.  No palpitations.  She has had no bleeding other than her hematuria.  The patient and her son report that she will will be going  [facility] in [place].  Her son reports that the patient has been having a little more trouble with her memory after being under anesthesia.  She also had a couple of falls with no injury or head injury.  They note she will be getting physical therapy at the assisted living facility.   ROS: See pertinent positives and negatives per HPI.  Past Medical History:  Diagnosis Date  . AAA (abdominal aortic aneurysm) ([place])   . Abdominal aneurysm without mention of rupture   . Atrioventricular block, complete ([place])   . Cardiac pacemaker in situ   . COPD (chronic obstructive pulmonary disease) ([place])   . Gout   . Hernia   . Hyperlipidemia   . Hypertension   . Kidney stone   . Presence of permanent cardiac pacemaker   . Rectal bleeding     Past Surgical History:  Procedure Laterality Date  . ABDOMINAL AORTIC ANEURYSM REPAIR     son denies  . CYSTOSCOPY/URETEROSCOPY/HOLMIUM LASER/STENT PLACEMENT Right 01/01/2019   Procedure: CYSTOSCOPY/URETEROSCOPY/HOLMIUM LASER/STENT PLACEMENT;  Surgeon: Billey Co, MD;  Location: [facility] ORS;  Service: Urology;  Laterality: Right;  . HERNIA REPAIR    . ohter     growth on colon surgery  . PPM GENERATOR CHANGEOUT N/A 02/25/2017   Procedure: PPM GENERATOR CHANGEOUT;  Surgeon: Deboraha Sprang, MD;  Location: [place] [facility];  Service: Cardiovascular;  Laterality: N/A;  . RENAL ANGIOGRAPHY Right 12/03/2017   Procedure: RENAL ANGIOGRAPHY;  Surgeon: Katha Cabal, MD;  Location: [place] [facility];  Service: Cardiovascular;  Laterality: Right;  . TONSILLECTOMY      Family History  Problem Relation Age of Onset  . Heart disease Father   . Alcohol abuse Father   . Cancer Brother   . Other Mother 40       MVA    SOCIAL HX: Former smoker   Current Outpatient Medications:  .  albuterol (PROAIR HFA) 108 (90 Base) MCG/ACT inhaler, Inhale 2 puffs into the lungs every 6 (six) hours as needed for  wheezing or shortness of breath., Disp: 1 Inhaler, Rfl: 5 .  amLODipine (NORVASC) 5 MG tablet, Take 1 tablet (5 mg total) by mouth daily., Disp: 90 tablet, Rfl: 3 .  apixaban (ELIQUIS) 2.5 MG TABS tablet, Take 1 tablet (2.5 mg total) by mouth 2 (two) times daily., Disp: 180 tablet, Rfl: 1 .  atenolol (TENORMIN) 25 MG tablet, TAKE 1 TABLET BY MOUTH DAILY (Patient taking differently: Take 25 mg by mouth daily. ), Disp: 90 tablet, Rfl: 0 .  cholecalciferol 25 MCG (1000 UT) TABS, Take 1,000 Units by mouth daily., Disp: , Rfl:  .  clopidogrel (PLAVIX) 75 MG tablet, Take 1 tablet (75 mg total) by mouth daily., Disp: 90 tablet, Rfl: 3 .  docusate sodium (COLACE) 100 MG capsule, Take 1 capsule (100 mg total) by mouth 2 (two) times daily. (Patient taking differently: Take 100 mg by mouth 2 (two) times daily as needed for moderate constipation. ), Disp: 60 capsule, Rfl: 0 .  rosuvastatin (CRESTOR) 5 MG tablet, TAKE 1 TABLET BY MOUTH DAILY USUALLY IN THE EVENING (Patient taking differently: Take 5 mg by mouth every evening. ), Disp: 90 tablet, Rfl: 3 .  sulfamethoxazole-trimethoprim (BACTRIM DS) 800-160 MG tablet, Take 1 tablet by mouth 2 (two) times daily., Disp: 14 tablet, Rfl: 0 .  tamsulosin (FLOMAX) 0.4 MG CAPS capsule, Take 1 capsule (0.4 mg total) by mouth daily., Disp: 30 capsule, Rfl: 0 .  traMADol (ULTRAM) 50 MG tablet, Take 1 tablet (50 mg total) by mouth every 6 (six) hours as needed., Disp: 20 tablet, Rfl: 0 .  vitamin B-12 (CYANOCOBALAMIN) 1000 MCG tablet, Take 1,000 mcg by mouth at bedtime., Disp: , Rfl:   EXAM:  VITALS per patient if applicable:  GENERAL: alert, oriented, appears well and in no acute distress  HEENT: atraumatic, conjunttiva clear, no obvious abnormalities on inspection of external nose and ears  NECK: normal movements of the head and neck  LUNGS: on inspection no signs of respiratory distress, breathing rate appears normal, no obvious gross SOB, gasping or wheezing  CV:  no obvious cyanosis  MS: moves all visible extremities without noticeable abnormality  PSYCH/NEURO: pleasant and cooperative, no obvious depression or anxiety, speech and thought processing grossly intact  ASSESSMENT AND PLAN:  Discussed the following assessment and plan:  Urothelial cancer ([place]) Diagnosed recently on biopsy.  She has opted against treatment.  She will continue follow-up with urology.  Nephrolithiasis Asymptomatic now.  She does have a stent in place.  She will go to have this removed as planned.    HYPERTENSION, BENIGN Undetermined control at home though has been elevated at the doctor's offices.  She will continue her current regimen.  They will start checking it at home and let us know what her readings are.  Plan to base changes on her home BPs.  Atrial fibrillation (HCC) Currently stable.  She will continue her current regimen.  Fall Recent fall with no injury.  She likely would benefit from being in assisted living facility.  We will help assist with an FL2.  I will send a message to our clinic RN to help facilitate this when she is back in the office.    I discussed the assessment and treatment plan with the patient. The patient was provided an opportunity to ask questions and all were answered. The patient agreed with the plan and demonstrated an understanding of the instructions.   The patient was advised to call back or seek an in-person evaluation if the symptoms worsen or if the condition fails to improve as anticipated.   [name], MD

## 2019-01-18 ENCOUNTER — Telehealth: Payer: Self-pay | Admitting: Family Medicine

## 2019-01-18 DIAGNOSIS — C689 Malignant neoplasm of urinary organ, unspecified: Secondary | ICD-10-CM | POA: Insufficient documentation

## 2019-01-18 DIAGNOSIS — N200 Calculus of kidney: Secondary | ICD-10-CM | POA: Insufficient documentation

## 2019-01-18 NOTE — Assessment & Plan Note (Signed)
Diagnosed recently on biopsy.  She has opted against treatment.  She will continue follow-up with urology.

## 2019-01-18 NOTE — Assessment & Plan Note (Signed)
Currently stable.  She will continue her current regimen.

## 2019-01-18 NOTE — Assessment & Plan Note (Signed)
Recent fall with no injury.  She likely would benefit from being in assisted living facility.  We will help assist with an FL2.  I will send a message to our clinic RN to help facilitate this when she is back in the office.

## 2019-01-18 NOTE — Telephone Encounter (Signed)
I completed a visit with this patient and her son last week.  They plan to get her into [facility].  They are shooting to have her there sometime this week.  Can you contact them to see if they can send [place] the paperwork so we can get it filled out?  Thanks.

## 2019-01-18 NOTE — Assessment & Plan Note (Addendum)
Undetermined control at home though has been elevated at the doctor's offices.  She will continue her current regimen.  They will start checking it at home and let us know what her readings are.  Plan to base changes on her home BPs.

## 2019-01-18 NOTE — Assessment & Plan Note (Signed)
Asymptomatic now.  She does have a stent in place.  She will go to have this removed as planned.

## 2019-01-19 ENCOUNTER — Ambulatory Visit: Payer: Medicare Other | Admitting: Urology

## 2019-01-19 ENCOUNTER — Other Ambulatory Visit: Payer: Self-pay

## 2019-01-19 ENCOUNTER — Encounter: Payer: Self-pay | Admitting: Urology

## 2019-01-19 VITALS — BP 168/83 | HR 76

## 2019-01-19 DIAGNOSIS — N200 Calculus of kidney: Secondary | ICD-10-CM | POA: Diagnosis not present

## 2019-01-19 NOTE — Telephone Encounter (Signed)
Blood pressure is adequately controlled.  She should continue with her current regimen.  Thank you for contacting [name] in [place].

## 2019-01-19 NOTE — Progress Notes (Signed)
Cystoscopy Procedure Note:  Indication: Stent removal s/p 12/10 R URS/basket extraction of stones/and upper tract biopsy of LG urothelial cell carcinoma. UA at visit 12/17 concerning for infection and she was started on Bactrim, urine cx ultimately negative.  She and her family refused further treatment of her upper tract urothelial cell carcinoma and opt for observation  After informed consent and discussion of the procedure and its risks, Jessica [name] was positioned and prepped in the standard fashion. Cystoscopy was performed with a flexible cystoscope. The stent was grasped with flexible graspers and removed in its entirety. The patient tolerated the procedure well.  Findings: Uncomplicated stent removal  Assessment and Plan: Follow up 6 months for [place] and symptom check Will reach out to PCP/cardiology/vascular to try to de-escalate anticoagulation  Billey Co, MD 01/19/2019

## 2019-01-19 NOTE — Telephone Encounter (Signed)
BP readings 107/55 , December 23 morning BP , 108/51 on 01/15/19 135/59 on 01/16/19 125/70 on 01/17/19 139/70 on 01/18/19 Also to contact [facility].

## 2019-01-20 LAB — URINALYSIS, COMPLETE
Bilirubin, UA: NEGATIVE
Nitrite, UA: POSITIVE — AB
Specific Gravity, UA: 1.015 (ref 1.005–1.030)
Urobilinogen, Ur: 2 mg/dL — ABNORMAL HIGH (ref 0.2–1.0)
pH, UA: 6 (ref 5.0–7.5)

## 2019-01-20 LAB — MICROSCOPIC EXAMINATION: RBC, Urine: >30 /hpf — AB (ref 0–2)

## 2019-01-21 NOTE — Telephone Encounter (Signed)
Placed with CMA fo r your review and signature.

## 2019-01-22 NOTE — Telephone Encounter (Signed)
Signed and given to Gae Bon.

## 2019-01-27 NOTE — Telephone Encounter (Signed)
I called the facility to see if they received the FL@ and the lady that answered stated that they did receive but because it was faxed it was hard to read, so the patient's son is coming to pick up the original.  Nina,cma

## 2019-01-27 NOTE — Telephone Encounter (Signed)
Fl2 filled out and signd by provider and faxed to facility today, with confirmation.  Karmina Zufall,cma

## 2019-01-29 ENCOUNTER — Telehealth: Payer: Self-pay | Admitting: Internal Medicine

## 2019-01-29 NOTE — Telephone Encounter (Signed)
Dollene Primrose, RN  Emily Filbert, RN; P [facility] [place] Triage  Per Dr. Caryl Comes and pacer report, he would like pt to be set up with a DCCV.   Thanks!   Lor[place]

## 2019-01-29 NOTE — Telephone Encounter (Signed)
I reviewed the patient's transmission again with Dr. Caryl Comes. I advised since the recommendations for DCCV were based off a transmission from 4 weeks ago, would he like the patient to transmit prior to scheduling her DCCV.  Per Dr. Caryl Comes- have the patient transmit, if still out of rhythm, then DCCV if the patient is agreeable.  I also advised Dr. Caryl Comes I reviewed Dr. Doristine Counter note from urology dated 01/21/19. Per the note Dr. Diamantina Providence stated: "She and her family refused further treatment of her upper tract urothelial cell carcinoma and opt for observation." "Assessment and Plan: Follow up 6 months for [place] and symptom check Will reach out to PCP/cardiology/vascular to try to de-escalate anticoagulation"   Dr. Caryl Comes called and spoke with Dr. Diamantina Providence. Per there discussion, the patient is ok to stop plavix.   I have called and spoken with the patient and her daughter in law, who is a Marine scientist. I have advised them of the results of the patient's most recent transmission and Dr. Olin Pia recommendations to: 1) Transmit again to reconfirm her rhythm 2) schedule a DCCV if the patient is agreeable 3) stop plavix  The patient is agreeable with a DCCV. She states she is really not having any symptoms with her a-fib and cannot tell when she goes in/ out of rhythm.   They confirm the patient missed a few night time doses of eliquis ~ 4 weeks ago, but has been on this BID since that time.   They do need help transmitting.  I advised will: 1) forward a message to [facility] to assist with this and notify me of the results of the transmission. 2) go ahead and get her scheduled for a DCCV on 1/15 at [facility] 3) schedule her for a COVID swab on 1/13 4) call her back once I receive the transmission results.  Verbal instructions for DCCV were reviewed with the patient's daughter in law.  You are scheduled for a Cardioversion on Friday 02/06/19 with Dr. Rockey Situ  Please arrive at the [place] [facility] at  6:30 am a.m. on the day of your procedure.  DIET INSTRUCTIONS:  Nothing to eat or drink after midnight the night before your procedure         1) Labs:   - Pre procedure COVID swab: Wed 02/04/19 (12:30 pm- 2:30 pm) at the [place] entrance at [facility]  2) Medications:  You may take all of your regular medications with enough water to get them down safely the morning of your procedure  3) Must have a responsible person to drive you home.  4) Bring a current list of your medications and current insurance cards.    If you have any questions after you get home, please call the office at [phone]. Alvis Lemmings, RN, BSN

## 2019-01-30 ENCOUNTER — Telehealth: Payer: Self-pay | Admitting: Internal Medicine

## 2019-01-30 ENCOUNTER — Other Ambulatory Visit: Payer: Self-pay | Admitting: Cardiovascular Disease

## 2019-01-30 DIAGNOSIS — I4891 Unspecified atrial fibrillation: Secondary | ICD-10-CM

## 2019-01-30 DIAGNOSIS — I10 Essential (primary) hypertension: Secondary | ICD-10-CM

## 2019-01-30 DIAGNOSIS — I442 Atrioventricular block, complete: Secondary | ICD-10-CM

## 2019-01-30 NOTE — Progress Notes (Signed)
PPM Remote  

## 2019-01-30 NOTE — Telephone Encounter (Signed)
Transmission received and reviewed. Persistent AF since 01/02/19. V rates controlled.   Spoke with patient. Advised of results. Pt aware of upcoming DCCV on 02/06/19. No additional questions at this time. Forwarded to [employer], Therapist, sports, as Juluis Rainier.

## 2019-01-30 NOTE — Telephone Encounter (Signed)
Spoke with patient's daughter-in-law, Anderson Malta. Patient gave me verbal permission over the phone to do so. Patient had spoke with nurse in device clinic today about her recent transmission and patient thought she needed to call [place]. Advised them I did not see any changes at this time but it appeared we would continue with cardioversion as scheduled. They have the preprocedural instructions and do not have any further questions at this time. Advised them I would let Nira Conn know of our conversation and Nira Conn would call if there was anything they needed to know.

## 2019-01-30 NOTE — Telephone Encounter (Signed)
Patient is returning the call regarding her procedure

## 2019-01-30 NOTE — Telephone Encounter (Signed)
I help the pt to send a manual transmission. Transmission received.

## 2019-02-02 NOTE — Telephone Encounter (Signed)
Noted  

## 2019-02-03 ENCOUNTER — Other Ambulatory Visit: Payer: Self-pay | Admitting: Internal Medicine

## 2019-02-04 ENCOUNTER — Other Ambulatory Visit: Payer: Self-pay

## 2019-02-04 ENCOUNTER — Other Ambulatory Visit
Admission: RE | Admit: 2019-02-04 | Discharge: 2019-02-04 | Disposition: A | Discharge status: Home / Self Care | Payer: Medicare Other | Source: Ambulatory Visit | Attending: Cardiovascular Disease | Admitting: Cardiovascular Disease

## 2019-02-04 DIAGNOSIS — Z01812 Encounter for preprocedural laboratory examination: Secondary | ICD-10-CM | POA: Diagnosis not present

## 2019-02-04 DIAGNOSIS — Z20822 Contact with and (suspected) exposure to covid-19: Secondary | ICD-10-CM | POA: Diagnosis not present

## 2019-02-05 LAB — SARS CORONAVIRUS 2 (TAT 6-24 HRS): SARS Coronavirus 2: NEGATIVE

## 2019-02-05 NOTE — Telephone Encounter (Signed)
I called and spoke with the patient. I advised her her Pre-procedure COVID swab was negative.  I reviewed her pre DCCV instructions with her.  We discussed the DCCV procedure again at her request. All questions were answered to the best of my ability.  The patient voiced understanding and had no further questions at this time.

## 2019-02-06 ENCOUNTER — Encounter
Admission: RE | Disposition: A | Discharge status: Home / Self Care | Payer: Self-pay | Source: Ambulatory Visit | Attending: Cardiovascular Disease

## 2019-02-06 ENCOUNTER — Ambulatory Visit
Admission: RE | Admit: 2019-02-06 | Discharge: 2019-02-06 | Disposition: A | Discharge status: Home / Self Care | Payer: Medicare Other | Source: Ambulatory Visit | Attending: Cardiovascular Disease | Admitting: Cardiovascular Disease

## 2019-02-06 ENCOUNTER — Ambulatory Visit: Payer: Medicare Other | Admitting: Registered Nurse

## 2019-02-06 ENCOUNTER — Encounter: Payer: Self-pay | Admitting: Cardiovascular Disease

## 2019-02-06 ENCOUNTER — Other Ambulatory Visit: Payer: Self-pay

## 2019-02-06 DIAGNOSIS — Z7902 Long term (current) use of antithrombotics/antiplatelets: Secondary | ICD-10-CM | POA: Diagnosis not present

## 2019-02-06 DIAGNOSIS — C688 Malignant neoplasm of overlapping sites of urinary organs: Secondary | ICD-10-CM | POA: Insufficient documentation

## 2019-02-06 DIAGNOSIS — E785 Hyperlipidemia, unspecified: Secondary | ICD-10-CM | POA: Diagnosis not present

## 2019-02-06 DIAGNOSIS — Z7901 Long term (current) use of anticoagulants: Secondary | ICD-10-CM | POA: Insufficient documentation

## 2019-02-06 DIAGNOSIS — I442 Atrioventricular block, complete: Secondary | ICD-10-CM | POA: Insufficient documentation

## 2019-02-06 DIAGNOSIS — I4819 Other persistent atrial fibrillation: Secondary | ICD-10-CM

## 2019-02-06 DIAGNOSIS — Z87891 Personal history of nicotine dependence: Secondary | ICD-10-CM | POA: Insufficient documentation

## 2019-02-06 DIAGNOSIS — I4891 Unspecified atrial fibrillation: Secondary | ICD-10-CM | POA: Insufficient documentation

## 2019-02-06 DIAGNOSIS — N200 Calculus of kidney: Secondary | ICD-10-CM | POA: Insufficient documentation

## 2019-02-06 DIAGNOSIS — Z950 Presence of cardiac pacemaker: Secondary | ICD-10-CM | POA: Insufficient documentation

## 2019-02-06 DIAGNOSIS — I10 Essential (primary) hypertension: Secondary | ICD-10-CM | POA: Diagnosis not present

## 2019-02-06 DIAGNOSIS — Z79899 Other long term (current) drug therapy: Secondary | ICD-10-CM | POA: Diagnosis not present

## 2019-02-06 DIAGNOSIS — I714 Abdominal aortic aneurysm, without rupture: Secondary | ICD-10-CM | POA: Diagnosis not present

## 2019-02-06 DIAGNOSIS — M109 Gout, unspecified: Secondary | ICD-10-CM | POA: Diagnosis not present

## 2019-02-06 DIAGNOSIS — J449 Chronic obstructive pulmonary disease, unspecified: Secondary | ICD-10-CM | POA: Insufficient documentation

## 2019-02-06 HISTORY — PX: CARDIOVERSION: SHX1299

## 2019-02-06 SURGERY — CARDIOVERSION
Anesthesia: General

## 2019-02-06 MED ORDER — SODIUM CHLORIDE 0.9 % IV SOLN: INTRAVENOUS | Status: DC | PRN

## 2019-02-06 MED ORDER — PROPOFOL 10 MG/ML IV BOLUS
INTRAVENOUS | Status: AC
  Filled 2019-02-06: qty 20

## 2019-02-06 MED ORDER — PROPOFOL 10 MG/ML IV BOLUS
INTRAVENOUS | Status: DC | PRN
  Administered 2019-02-06: 40 mg via INTRAVENOUS

## 2019-02-06 NOTE — Anesthesia Preprocedure Evaluation (Signed)
Anesthesia Evaluation  Patient identified by MRN, date of birth, ID band Patient awake    Reviewed: Allergy & Precautions, NPO status , Patient's Chart, lab work & pertinent test results  History of Anesthesia Complications Negative for: history of anesthetic complications  Airway Mallampati: II  TM Distance: >3 FB Neck ROM: Full    Dental  (+) Edentulous Upper, Missing, Poor Dentition   Pulmonary neg sleep apnea, COPD,  COPD inhaler, former smoker,    breath sounds clear to auscultation- rhonchi (-) wheezing      Cardiovascular hypertension, + Peripheral Vascular Disease (AAA)  (-) CAD, (-) Past MI, (-) Cardiac Stents and (-) CABG + dysrhythmias Atrial Fibrillation + pacemaker  Rhythm:Regular Rate:Normal - Systolic murmurs and - Diastolic murmurs    Neuro/Psych neg Seizures PSYCHIATRIC DISORDERS Depression negative neurological ROS     GI/Hepatic negative GI ROS, Neg liver ROS,   Endo/Other  negative endocrine ROSneg diabetes  Renal/GU Renal disease: hx of nephrolithiasis.     Musculoskeletal negative musculoskeletal ROS (+)   Abdominal (+) - obese,   Peds  Hematology negative hematology ROS (+)   Anesthesia Other Findings Past Medical History: No date: AAA (abdominal aortic aneurysm) (HCC) No date: Atrioventricular block, complete (HCC) No date: Cardiac pacemaker in situ No date: COPD (chronic obstructive pulmonary disease) (HCC) No date: Gout No date: Hernia No date: Hyperlipidemia No date: Hypertension No date: Kidney stone No date: Presence of permanent cardiac pacemaker No date: Rectal bleeding   Reproductive/Obstetrics                             Anesthesia Physical Anesthesia Plan  ASA: III  Anesthesia Plan: General   Post-op Pain Management:    Induction: Intravenous  PONV Risk Score and Plan: 2 and Propofol infusion  Airway Management Planned: Natural  Airway  Additional Equipment:   Intra-op Plan:   Post-operative Plan:   Informed Consent: I have reviewed the patients History and Physical, chart, labs and discussed the procedure including the risks, benefits and alternatives for the proposed anesthesia with the patient or authorized representative who has indicated his/her understanding and acceptance.     Dental advisory given  Plan Discussed with: CRNA and Anesthesiologist  Anesthesia Plan Comments:         Anesthesia Quick Evaluation

## 2019-02-06 NOTE — Anesthesia Postprocedure Evaluation (Signed)
Anesthesia Post Note  Patient: Jessica [name]  Procedure(s) Performed: CARDIOVERSION (N/A )  Patient location during evaluation: Other (specials recovery) Anesthesia Type: General Level of consciousness: awake and alert and oriented Pain management: pain level controlled Vital Signs Assessment: post-procedure vital signs reviewed and stable Respiratory status: spontaneous breathing, nonlabored ventilation and respiratory function stable Cardiovascular status: blood pressure returned to baseline and stable Postop Assessment: no signs of nausea or vomiting Anesthetic complications: no     Last Vitals:  Vitals:   02/06/19 0800 02/06/19 0815  BP: (!) 143/61 123/76  Pulse: (!) 59 63  Resp: 19 (!) 27  Temp:    SpO2: 100% 98%    Last Pain:  Vitals:   02/06/19 0815  TempSrc:   PainSc: 0-No pain                 Demarcus Thielke

## 2019-02-06 NOTE — H&P (Signed)
H&P Addendum, pre-cardioversion  Patient was seen and evaluated prior to -cardioversion procedure Symptoms, prior testing details again confirmed with the patient Patient examined, no significant change from prior exam Lab work reviewed in detail personally by myself Patient understands risk and benefit of the procedure, willing to proceed  Signed, Tim Carl Butner, MD, Ph.D [facility]  

## 2019-02-06 NOTE — Anesthesia Procedure Notes (Signed)
Performed by: Kelechi Orgeron, CRNA Pre-anesthesia Checklist: Patient identified, Emergency Drugs available, Suction available and Patient being monitored Patient Re-evaluated:Patient Re-evaluated prior to induction Oxygen Delivery Method: Nasal cannula Induction Type: IV induction Dental Injury: Teeth and Oropharynx as per pre-operative assessment  Comments: Nasal cannula with etCO2 monitoring       

## 2019-02-06 NOTE — CV Procedure (Addendum)
Cardioversion procedure note For atrial fibrillation, persistent.  Procedure Details:  Consent: Risks of procedure as well as the alternatives and risks of each were explained to the (patient/caregiver). Consent for procedure obtained.  Time Out: Verified patient identification, verified procedure, site/side was marked, verified correct patient position, special equipment/implants available, medications/allergies/relevent history reviewed, required imaging and test results available. Performed  Patient placed on cardiac monitor, pulse oximetry, supplemental oxygen as necessary.  Sedation given: propofol IV, Dr. Randa Lynn Pacer pads placed anterior and posterior chest.   Cardioverted 1 time(s).  Cardioverted at  120 J. Synchronized biphasic Converted to NSR   Evaluation: Findings: Post procedure EKG shows: NSR Complications: None Patient did tolerate procedure well.  Time Spent Directly with the Patient:  70 minutes   Esmond Plants, M.D., Ph.D.

## 2019-02-06 NOTE — Transfer of Care (Signed)
Immediate Anesthesia Transfer of Care Note  Patient: Jessica [name]  Procedure(s) Performed: Procedure(s): CARDIOVERSION (N/A)  Patient Location: PACU and Short Stay  Anesthesia Type:General  Level of Consciousness: awake, alert  and oriented  Airway & Oxygen Therapy: Patient Spontanous Breathing and Patient connected to nasal cannula oxygen  Post-op Assessment: Report given to RN and Post -op Vital signs reviewed and stable  Post vital signs: Reviewed and stable  Last Vitals:  Vitals:   02/06/19 0742 02/06/19 0743  BP:  (!) 146/66  Pulse: (!) 59   Resp: (!) 22 (!) 26  Temp:    SpO2: 161% 096%    Complications: No apparent anesthesia complications

## 2019-02-10 ENCOUNTER — Telehealth: Payer: Self-pay | Admitting: Internal Medicine

## 2019-02-10 ENCOUNTER — Telehealth: Payer: Self-pay

## 2019-02-10 NOTE — Telephone Encounter (Signed)
I spoke with the patient. She states she just received a call from Amy, RN in the [facility] stating she was back in a-fib.  As I was speaking with the patient, Amy sent me a phone message as well.  The patient states she cannot tell when she went back in a-fib. She is aware I will review further with Dr. Caryl Comes and call her back with MD recommendations.    I will attach this information to the note from Amy, RN and forward to Dr. Caryl Comes to review.

## 2019-02-10 NOTE — Telephone Encounter (Signed)
Pt is s/p DCCV on 02/06/19, device alert received for recurrent ongoing AF.  Spoke with pt, she was not aware of being back in AFIB, she continues to be asymptomatic.  Pt is on [place]- Eliquis.  Next OV scheduled 03/31/19.

## 2019-02-10 NOTE — Telephone Encounter (Signed)
Patient wants to talk to [facility] about current episode of afib and recent cardioversion

## 2019-02-10 NOTE — Telephone Encounter (Signed)
The patient called the office as Amy was sending me this encounter.   I spoke with the patient. She states she just received a call from Amy, RN in the [facility] stating she was back in a-fib.  As I was speaking with the patient, Amy sent me a phone message as well.  The patient states she cannot tell when she went back in a-fib. She is aware I will review further with Dr. Caryl Comes and call her back with MD recommendations.

## 2019-02-11 NOTE — Telephone Encounter (Signed)
I spoke with the patient and her son. I have advised them of Dr. Olin Pia recommendations. The patient states she has only had 1 episode of SOB that lasted minutes, but otherwise she is feeling fine.  I have advised her we will leave things as they are for now.  I have asked that she call me back if she develops increased SOB for longer periods of time, swelling, dizziness or lightheadedness. Otherwise, we will see her as scheduled on 03/31/19.  The patient voices understanding and is agreeable.

## 2019-02-11 NOTE — Telephone Encounter (Signed)
If without symptoms attributable to afib, will not try and restore sinus

## 2019-02-17 DIAGNOSIS — I119 Hypertensive heart disease without heart failure: Secondary | ICD-10-CM | POA: Diagnosis not present

## 2019-02-17 DIAGNOSIS — I4891 Unspecified atrial fibrillation: Secondary | ICD-10-CM | POA: Diagnosis not present

## 2019-02-17 DIAGNOSIS — D6869 Other thrombophilia: Secondary | ICD-10-CM | POA: Diagnosis not present

## 2019-02-17 DIAGNOSIS — I6529 Occlusion and stenosis of unspecified carotid artery: Secondary | ICD-10-CM | POA: Diagnosis not present

## 2019-02-20 DIAGNOSIS — U071 COVID-19: Secondary | ICD-10-CM | POA: Diagnosis not present

## 2019-02-25 ENCOUNTER — Ambulatory Visit: Payer: Medicare Other | Admitting: Internal Medicine

## 2019-02-26 ENCOUNTER — Telehealth: Payer: Self-pay | Admitting: Internal Medicine

## 2019-02-26 DIAGNOSIS — I6529 Occlusion and stenosis of unspecified carotid artery: Secondary | ICD-10-CM | POA: Diagnosis not present

## 2019-02-26 DIAGNOSIS — I4891 Unspecified atrial fibrillation: Secondary | ICD-10-CM | POA: Diagnosis not present

## 2019-02-26 DIAGNOSIS — R413 Other amnesia: Secondary | ICD-10-CM | POA: Diagnosis not present

## 2019-02-26 DIAGNOSIS — R296 Repeated falls: Secondary | ICD-10-CM | POA: Diagnosis not present

## 2019-02-26 DIAGNOSIS — J449 Chronic obstructive pulmonary disease, unspecified: Secondary | ICD-10-CM | POA: Diagnosis not present

## 2019-02-26 DIAGNOSIS — Z950 Presence of cardiac pacemaker: Secondary | ICD-10-CM | POA: Diagnosis not present

## 2019-02-26 DIAGNOSIS — I10 Essential (primary) hypertension: Secondary | ICD-10-CM | POA: Diagnosis not present

## 2019-02-26 DIAGNOSIS — I119 Hypertensive heart disease without heart failure: Secondary | ICD-10-CM | POA: Diagnosis not present

## 2019-02-26 DIAGNOSIS — I714 Abdominal aortic aneurysm, without rupture: Secondary | ICD-10-CM | POA: Diagnosis not present

## 2019-02-26 NOTE — Telephone Encounter (Signed)
  1. Has your device fired? no  2. Is you device beeping? no  3. Are you experiencing draining or swelling at device site? no  4. Are you calling to see if we received your device transmission?  Patient missing a wire for the remote box at home after moving   5. Have you passed out? No  Please call to discuss options for set up    Please route to [place]

## 2019-02-26 NOTE — Telephone Encounter (Signed)
I spoke with the pt and gave her the number to [place] support for additional help.

## 2019-02-27 DIAGNOSIS — U071 COVID-19: Secondary | ICD-10-CM | POA: Diagnosis not present

## 2019-03-04 DIAGNOSIS — I4891 Unspecified atrial fibrillation: Secondary | ICD-10-CM | POA: Diagnosis not present

## 2019-03-04 DIAGNOSIS — Z950 Presence of cardiac pacemaker: Secondary | ICD-10-CM | POA: Diagnosis not present

## 2019-03-04 DIAGNOSIS — J449 Chronic obstructive pulmonary disease, unspecified: Secondary | ICD-10-CM | POA: Diagnosis not present

## 2019-03-04 DIAGNOSIS — R296 Repeated falls: Secondary | ICD-10-CM | POA: Diagnosis not present

## 2019-03-04 DIAGNOSIS — R413 Other amnesia: Secondary | ICD-10-CM | POA: Diagnosis not present

## 2019-03-04 DIAGNOSIS — I10 Essential (primary) hypertension: Secondary | ICD-10-CM | POA: Diagnosis not present

## 2019-03-04 DIAGNOSIS — I6529 Occlusion and stenosis of unspecified carotid artery: Secondary | ICD-10-CM | POA: Diagnosis not present

## 2019-03-04 DIAGNOSIS — I714 Abdominal aortic aneurysm, without rupture: Secondary | ICD-10-CM | POA: Diagnosis not present

## 2019-03-04 DIAGNOSIS — I119 Hypertensive heart disease without heart failure: Secondary | ICD-10-CM | POA: Diagnosis not present

## 2019-03-05 ENCOUNTER — Ambulatory Visit (INDEPENDENT_AMBULATORY_CARE_PROVIDER_SITE_OTHER): Payer: Medicare Other | Admitting: Vascular Surgery

## 2019-03-05 ENCOUNTER — Encounter (INDEPENDENT_AMBULATORY_CARE_PROVIDER_SITE_OTHER): Payer: Medicare Other

## 2019-03-05 DIAGNOSIS — R413 Other amnesia: Secondary | ICD-10-CM | POA: Diagnosis not present

## 2019-03-05 DIAGNOSIS — I4891 Unspecified atrial fibrillation: Secondary | ICD-10-CM | POA: Diagnosis not present

## 2019-03-05 DIAGNOSIS — I119 Hypertensive heart disease without heart failure: Secondary | ICD-10-CM | POA: Diagnosis not present

## 2019-03-05 DIAGNOSIS — R296 Repeated falls: Secondary | ICD-10-CM | POA: Diagnosis not present

## 2019-03-05 DIAGNOSIS — J449 Chronic obstructive pulmonary disease, unspecified: Secondary | ICD-10-CM | POA: Diagnosis not present

## 2019-03-05 DIAGNOSIS — Z950 Presence of cardiac pacemaker: Secondary | ICD-10-CM | POA: Diagnosis not present

## 2019-03-05 DIAGNOSIS — I10 Essential (primary) hypertension: Secondary | ICD-10-CM | POA: Diagnosis not present

## 2019-03-05 DIAGNOSIS — I6529 Occlusion and stenosis of unspecified carotid artery: Secondary | ICD-10-CM | POA: Diagnosis not present

## 2019-03-05 DIAGNOSIS — I714 Abdominal aortic aneurysm, without rupture: Secondary | ICD-10-CM | POA: Diagnosis not present

## 2019-03-06 DIAGNOSIS — U071 COVID-19: Secondary | ICD-10-CM | POA: Diagnosis not present

## 2019-03-10 DIAGNOSIS — I119 Hypertensive heart disease without heart failure: Secondary | ICD-10-CM | POA: Diagnosis not present

## 2019-03-10 DIAGNOSIS — K5900 Constipation, unspecified: Secondary | ICD-10-CM | POA: Diagnosis not present

## 2019-03-12 DIAGNOSIS — Z79899 Other long term (current) drug therapy: Secondary | ICD-10-CM | POA: Diagnosis not present

## 2019-03-12 DIAGNOSIS — E7849 Other hyperlipidemia: Secondary | ICD-10-CM | POA: Diagnosis not present

## 2019-03-12 DIAGNOSIS — E559 Vitamin D deficiency, unspecified: Secondary | ICD-10-CM | POA: Diagnosis not present

## 2019-03-12 DIAGNOSIS — E119 Type 2 diabetes mellitus without complications: Secondary | ICD-10-CM | POA: Diagnosis not present

## 2019-03-12 DIAGNOSIS — E038 Other specified hypothyroidism: Secondary | ICD-10-CM | POA: Diagnosis not present

## 2019-03-12 DIAGNOSIS — D518 Other vitamin B12 deficiency anemias: Secondary | ICD-10-CM | POA: Diagnosis not present

## 2019-03-13 DIAGNOSIS — R413 Other amnesia: Secondary | ICD-10-CM | POA: Diagnosis not present

## 2019-03-13 DIAGNOSIS — R296 Repeated falls: Secondary | ICD-10-CM | POA: Diagnosis not present

## 2019-03-13 DIAGNOSIS — I4891 Unspecified atrial fibrillation: Secondary | ICD-10-CM | POA: Diagnosis not present

## 2019-03-13 DIAGNOSIS — Z950 Presence of cardiac pacemaker: Secondary | ICD-10-CM | POA: Diagnosis not present

## 2019-03-13 DIAGNOSIS — J449 Chronic obstructive pulmonary disease, unspecified: Secondary | ICD-10-CM | POA: Diagnosis not present

## 2019-03-13 DIAGNOSIS — I6529 Occlusion and stenosis of unspecified carotid artery: Secondary | ICD-10-CM | POA: Diagnosis not present

## 2019-03-13 DIAGNOSIS — I714 Abdominal aortic aneurysm, without rupture: Secondary | ICD-10-CM | POA: Diagnosis not present

## 2019-03-13 DIAGNOSIS — U071 COVID-19: Secondary | ICD-10-CM | POA: Diagnosis not present

## 2019-03-13 DIAGNOSIS — I10 Essential (primary) hypertension: Secondary | ICD-10-CM | POA: Diagnosis not present

## 2019-03-13 DIAGNOSIS — I119 Hypertensive heart disease without heart failure: Secondary | ICD-10-CM | POA: Diagnosis not present

## 2019-03-15 DIAGNOSIS — I714 Abdominal aortic aneurysm, without rupture: Secondary | ICD-10-CM | POA: Diagnosis not present

## 2019-03-15 DIAGNOSIS — I4891 Unspecified atrial fibrillation: Secondary | ICD-10-CM | POA: Diagnosis not present

## 2019-03-15 DIAGNOSIS — J449 Chronic obstructive pulmonary disease, unspecified: Secondary | ICD-10-CM | POA: Diagnosis not present

## 2019-03-15 DIAGNOSIS — I119 Hypertensive heart disease without heart failure: Secondary | ICD-10-CM | POA: Diagnosis not present

## 2019-03-15 DIAGNOSIS — Z950 Presence of cardiac pacemaker: Secondary | ICD-10-CM | POA: Diagnosis not present

## 2019-03-15 DIAGNOSIS — I10 Essential (primary) hypertension: Secondary | ICD-10-CM | POA: Diagnosis not present

## 2019-03-15 DIAGNOSIS — R296 Repeated falls: Secondary | ICD-10-CM | POA: Diagnosis not present

## 2019-03-15 DIAGNOSIS — I6529 Occlusion and stenosis of unspecified carotid artery: Secondary | ICD-10-CM | POA: Diagnosis not present

## 2019-03-15 DIAGNOSIS — R413 Other amnesia: Secondary | ICD-10-CM | POA: Diagnosis not present

## 2019-03-20 DIAGNOSIS — U071 COVID-19: Secondary | ICD-10-CM | POA: Diagnosis not present

## 2019-03-27 DIAGNOSIS — I10 Essential (primary) hypertension: Secondary | ICD-10-CM | POA: Diagnosis not present

## 2019-03-27 DIAGNOSIS — I6529 Occlusion and stenosis of unspecified carotid artery: Secondary | ICD-10-CM | POA: Diagnosis not present

## 2019-03-27 DIAGNOSIS — J449 Chronic obstructive pulmonary disease, unspecified: Secondary | ICD-10-CM | POA: Diagnosis not present

## 2019-03-27 DIAGNOSIS — I4891 Unspecified atrial fibrillation: Secondary | ICD-10-CM | POA: Diagnosis not present

## 2019-03-27 DIAGNOSIS — I714 Abdominal aortic aneurysm, without rupture: Secondary | ICD-10-CM | POA: Diagnosis not present

## 2019-03-27 DIAGNOSIS — R413 Other amnesia: Secondary | ICD-10-CM | POA: Diagnosis not present

## 2019-03-27 DIAGNOSIS — I119 Hypertensive heart disease without heart failure: Secondary | ICD-10-CM | POA: Diagnosis not present

## 2019-03-27 DIAGNOSIS — R296 Repeated falls: Secondary | ICD-10-CM | POA: Diagnosis not present

## 2019-03-27 DIAGNOSIS — U071 COVID-19: Secondary | ICD-10-CM | POA: Diagnosis not present

## 2019-03-27 DIAGNOSIS — Z950 Presence of cardiac pacemaker: Secondary | ICD-10-CM | POA: Diagnosis not present

## 2019-03-31 ENCOUNTER — Other Ambulatory Visit: Payer: Self-pay

## 2019-03-31 ENCOUNTER — Ambulatory Visit (INDEPENDENT_AMBULATORY_CARE_PROVIDER_SITE_OTHER): Payer: Medicare Other | Admitting: *Deleted

## 2019-03-31 ENCOUNTER — Telehealth: Payer: Medicare Other | Admitting: Internal Medicine

## 2019-03-31 DIAGNOSIS — I442 Atrioventricular block, complete: Secondary | ICD-10-CM | POA: Diagnosis not present

## 2019-03-31 LAB — CUP PACEART REMOTE DEVICE CHECK
Battery Remaining Longevity: 117 mo
Battery Remaining Percentage: 95.5 %
Battery Voltage: 2.99 V
Brady Statistic AP VP Percent: 95 %
Brady Statistic AP VS Percent: 1 %
Brady Statistic AS VP Percent: 4.2 %
Brady Statistic AS VS Percent: 1 %
Brady Statistic RA Percent Paced: 25 %
Brady Statistic RV Percent Paced: 99 %
Date Time Interrogation Session: 20210308073726
Implantable Lead Implant Date: 20090610
Implantable Lead Implant Date: 20090610
Implantable Lead Location: 753859
Implantable Lead Location: 753860
Implantable Lead Model: 5076
Implantable Pulse Generator Implant Date: 20190204
Lead Channel Impedance Value: 380 Ohm
Lead Channel Impedance Value: 430 Ohm
Lead Channel Pacing Threshold Amplitude: 0.75 V
Lead Channel Pacing Threshold Amplitude: 0.875 V
Lead Channel Pacing Threshold Pulse Width: 0.5 ms
Lead Channel Pacing Threshold Pulse Width: 0.5 ms
Lead Channel Sensing Intrinsic Amplitude: 2.1 mV
Lead Channel Sensing Intrinsic Amplitude: 3.7 mV
Lead Channel Setting Pacing Amplitude: 1.125
Lead Channel Setting Pacing Amplitude: 2 V
Lead Channel Setting Pacing Pulse Width: 0.5 ms
Lead Channel Setting Sensing Sensitivity: 2 mV
Pulse Gen Model: 2272
Pulse Gen Serial Number: 8991929

## 2019-03-31 NOTE — Progress Notes (Unsigned)
Error

## 2019-04-01 NOTE — Progress Notes (Signed)
PPM Remote  

## 2019-04-03 DIAGNOSIS — U071 COVID-19: Secondary | ICD-10-CM | POA: Diagnosis not present

## 2019-04-04 DIAGNOSIS — I714 Abdominal aortic aneurysm, without rupture: Secondary | ICD-10-CM | POA: Diagnosis not present

## 2019-04-04 DIAGNOSIS — I6529 Occlusion and stenosis of unspecified carotid artery: Secondary | ICD-10-CM | POA: Diagnosis not present

## 2019-04-04 DIAGNOSIS — I119 Hypertensive heart disease without heart failure: Secondary | ICD-10-CM | POA: Diagnosis not present

## 2019-04-04 DIAGNOSIS — I4891 Unspecified atrial fibrillation: Secondary | ICD-10-CM | POA: Diagnosis not present

## 2019-04-07 DIAGNOSIS — I119 Hypertensive heart disease without heart failure: Secondary | ICD-10-CM | POA: Diagnosis not present

## 2019-04-07 DIAGNOSIS — J449 Chronic obstructive pulmonary disease, unspecified: Secondary | ICD-10-CM | POA: Diagnosis not present

## 2019-04-07 DIAGNOSIS — I4891 Unspecified atrial fibrillation: Secondary | ICD-10-CM | POA: Diagnosis not present

## 2019-04-07 DIAGNOSIS — I6529 Occlusion and stenosis of unspecified carotid artery: Secondary | ICD-10-CM | POA: Diagnosis not present

## 2019-04-10 DIAGNOSIS — U071 COVID-19: Secondary | ICD-10-CM | POA: Diagnosis not present

## 2019-04-22 ENCOUNTER — Telehealth: Payer: Self-pay

## 2019-04-22 NOTE — Telephone Encounter (Signed)
  Patient Consent for Virtual Visit         Jessica [name] has provided verbal consent on 04/22/2019 for a virtual visit (video or telephone).   CONSENT FOR VIRTUAL VISIT FOR:  Jessica [name]  By participating in this virtual visit I agree to the following:  I hereby voluntarily request, consent and authorize [facility] and its employed or contracted physicians, physician assistants, nurse practitioners or other licensed health care professionals (the Practitioner), to provide me with telemedicine health care services (the "Services") as deemed necessary by the treating Practitioner. I acknowledge and consent to receive the Services by the Practitioner via telemedicine. I understand that the telemedicine visit will involve communicating with the Practitioner through live audiovisual communication technology and the disclosure of certain medical information by electronic transmission. I acknowledge that I have been given the opportunity to request an in-person assessment or other available alternative prior to the telemedicine visit and am voluntarily participating in the telemedicine visit.  I understand that I have the right to withhold or withdraw my consent to the use of telemedicine in the course of my care at any time, without affecting my right to future care or treatment, and that the Practitioner or I may terminate the telemedicine visit at any time. I understand that I have the right to inspect all information obtained and/or recorded in the course of the telemedicine visit and may receive copies of available information for a reasonable fee.  I understand that some of the potential risks of receiving the Services via telemedicine include:  Marland Kitchen Delay or interruption in medical evaluation due to technological equipment failure or disruption; . Information transmitted may not be sufficient (e.g. poor resolution of images) to allow for appropriate medical decision making by the Practitioner;  and/or  . In rare instances, security protocols could fail, causing a breach of personal health information.  Furthermore, I acknowledge that it is my responsibility to provide information about my medical history, conditions and care that is complete and accurate to the best of my ability. I acknowledge that Practitioner's advice, recommendations, and/or decision may be based on factors not within their control, such as incomplete or inaccurate data provided by me or distortions of diagnostic images or specimens that may result from electronic transmissions. I understand that the practice of medicine is not an exact science and that Practitioner makes no warranties or guarantees regarding treatment outcomes. I acknowledge that a copy of this consent can be made available to me via my patient portal ([facility]), or I can request a printed copy by calling the office of [facility].    I understand that my insurance will be billed for this visit.   I have read or had this consent read to me. . I understand the contents of this consent, which adequately explains the benefits and risks of the Services being provided via telemedicine.  . I have been provided ample opportunity to ask questions regarding this consent and the Services and have had my questions answered to my satisfaction. . I give my informed consent for the services to be provided through the use of telemedicine in my medical care

## 2019-05-04 LAB — CUP PACEART REMOTE DEVICE CHECK
Battery Remaining Longevity: 113 mo
Battery Remaining Percentage: 95.5 %
Battery Voltage: 2.99 V
Brady Statistic AP VP Percent: 96 %
Brady Statistic AP VS Percent: 1 %
Brady Statistic AS VP Percent: 3.2 %
Brady Statistic AS VS Percent: 1 %
Brady Statistic RA Percent Paced: 30 %
Brady Statistic RV Percent Paced: 99 %
Date Time Interrogation Session: 20210412072925
Implantable Lead Implant Date: 20090610
Implantable Lead Implant Date: 20090610
Implantable Lead Location: 753859
Implantable Lead Location: 753860
Implantable Lead Model: 5076
Implantable Pulse Generator Implant Date: 20190204
Lead Channel Impedance Value: 310 Ohm
Lead Channel Impedance Value: 410 Ohm
Lead Channel Pacing Threshold Amplitude: 0.75 V
Lead Channel Pacing Threshold Amplitude: 0.875 V
Lead Channel Pacing Threshold Pulse Width: 0.5 ms
Lead Channel Pacing Threshold Pulse Width: 0.5 ms
Lead Channel Sensing Intrinsic Amplitude: 0.5 mV
Lead Channel Sensing Intrinsic Amplitude: 3.8 mV
Lead Channel Setting Pacing Amplitude: 1.125
Lead Channel Setting Pacing Amplitude: 2 V
Lead Channel Setting Pacing Pulse Width: 0.5 ms
Lead Channel Setting Sensing Sensitivity: 2 mV
Pulse Gen Model: 2272
Pulse Gen Serial Number: 8991929

## 2019-05-05 ENCOUNTER — Telehealth (INDEPENDENT_AMBULATORY_CARE_PROVIDER_SITE_OTHER): Payer: Medicare Other | Admitting: Internal Medicine

## 2019-05-05 ENCOUNTER — Encounter: Payer: Self-pay | Admitting: Internal Medicine

## 2019-05-05 ENCOUNTER — Other Ambulatory Visit: Payer: Self-pay

## 2019-05-05 VITALS — BP 148/68 | HR 72 | Temp 97.3°F | Ht 65.0 in | Wt 120.0 lb

## 2019-05-05 DIAGNOSIS — I4891 Unspecified atrial fibrillation: Secondary | ICD-10-CM | POA: Diagnosis not present

## 2019-05-05 DIAGNOSIS — N183 Chronic kidney disease, stage 3 unspecified: Secondary | ICD-10-CM | POA: Diagnosis not present

## 2019-05-05 DIAGNOSIS — Z950 Presence of cardiac pacemaker: Secondary | ICD-10-CM

## 2019-05-05 DIAGNOSIS — I442 Atrioventricular block, complete: Secondary | ICD-10-CM

## 2019-05-05 DIAGNOSIS — I951 Orthostatic hypotension: Secondary | ICD-10-CM

## 2019-05-05 DIAGNOSIS — I129 Hypertensive chronic kidney disease with stage 1 through stage 4 chronic kidney disease, or unspecified chronic kidney disease: Secondary | ICD-10-CM

## 2019-05-05 DIAGNOSIS — I119 Hypertensive heart disease without heart failure: Secondary | ICD-10-CM | POA: Diagnosis not present

## 2019-05-05 DIAGNOSIS — I4819 Other persistent atrial fibrillation: Secondary | ICD-10-CM | POA: Diagnosis not present

## 2019-05-05 DIAGNOSIS — J449 Chronic obstructive pulmonary disease, unspecified: Secondary | ICD-10-CM | POA: Diagnosis not present

## 2019-05-05 DIAGNOSIS — I6529 Occlusion and stenosis of unspecified carotid artery: Secondary | ICD-10-CM | POA: Diagnosis not present

## 2019-05-05 NOTE — Progress Notes (Signed)
Electrophysiology TeleHealth Note   Due to national recommendations of social distancing due to COVID 19, an audio/video telehealth visit is felt to be most appropriate for this patient at this time.  See MyChart message from today for the patient's consent to telehealth for [facility].   Date:  05/05/2019   ID:  Jessica [name], DOB 02/27/1929, MRN [id number]  Location: patient's home  Provider location: [address] [place]  Evaluation Performed: Follow-up visit  PCP:  Leone Haven, MD  Cardiologist:    Electrophysiologist:  SK   Chief Complaint:  Dizziness   History of Present Illness:    Jessica [name] is a 86 y.o. female who presents via audio/video conferencing for a telehealth visit today.  Since last being seen in our clinic for afib, complete heart block pacing St Jude and orthostatic hypotension, the patient reports  Feeling good.  Unaware of afib, no bleeding.  Still dizzy with standing, and has fallen with her walker. Now in assisted living-- likes the food  Lower extremity bruising and rubor, no swelling    Device History: Pacemaker  implanted 1980  for CH[facility] Changed out 2009. 2019   Date Lead Status   1980 CPI 0505 Abandoned   2009 A 5076   2009 V 1688        10/19 she underwent renal artery stenting  DATE TEST    7/18 Myoview   EF 65 % No ischemia  8//18 Echo    EF 65 % Mod LVH        Date Cr K Hgb  7/17 1.36 4.0   5/18     14.4  3/19  1.58 3.3-->5.0 14.3-->11.9  1/20 1.14 3.6 11.7  12.20 1.43 3.7 89.3     Thromboembolic risk factors ( age  -2, HTN-1, TIA/CVA-2, Vasc disease -1, Gender-1) for a CHADSVASc Score of 7    The patient denies symptoms of fevers, chills, cough, or new SOB worrisome for COVID 19.   S/p covid vaccine   Past Medical History:  Diagnosis Date  . AAA (abdominal aortic aneurysm) ([place])   . Atrioventricular block, complete ([place])   . Cardiac pacemaker in situ   . COPD (chronic  obstructive pulmonary disease) ([facility])   . Gout   . Hernia   . Hyperlipidemia   . Hypertension   . Kidney stone   . Presence of permanent cardiac pacemaker   . Rectal bleeding     Past Surgical History:  Procedure Laterality Date  . ABDOMINAL AORTIC ANEURYSM REPAIR     son denies  . CARDIOVERSION N/A 02/06/2019   Procedure: CARDIOVERSION;  Surgeon: Minna Merritts, MD;  Location: [facility] ORS;  Service: Cardiovascular;  Laterality: N/A;  . CYSTOSCOPY/URETEROSCOPY/HOLMIUM LASER/STENT PLACEMENT Right 01/01/2019   Procedure: CYSTOSCOPY/URETEROSCOPY/HOLMIUM LASER/STENT PLACEMENT;  Surgeon: Billey Co, MD;  Location: [facility] ORS;  Service: Urology;  Laterality: Right;  . HERNIA REPAIR    . ohter     growth on colon surgery  . PPM GENERATOR CHANGEOUT N/A 02/25/2017   Procedure: PPM GENERATOR CHANGEOUT;  Surgeon: Deboraha Sprang, MD;  Location: [facility];  Service: Cardiovascular;  Laterality: N/A;  . RENAL ANGIOGRAPHY Right 12/03/2017   Procedure: RENAL ANGIOGRAPHY;  Surgeon: Katha Cabal, MD;  Location: [facility];  Service: Cardiovascular;  Laterality: Right;  . TONSILLECTOMY      Current Outpatient Medications  Medication Sig Dispense Refill  . acetaminophen (TYLENOL) 500 MG tablet  Take 500 mg by mouth every 6 (six) hours as needed for moderate pain or headache.    . albuterol (PROAIR HFA) 108 (90 Base) MCG/ACT inhaler Inhale 2 puffs into the lungs every 6 (six) hours as needed for wheezing or shortness of breath. 1 Inhaler 5  . amLODipine (NORVASC) 5 MG tablet Take 1 tablet (5 mg total) by mouth daily. Please call to schedule office visit for further refills. 90 tablet 0  . apixaban (ELIQUIS) 2.5 MG TABS tablet Take 1 tablet (2.5 mg total) by mouth 2 (two) times daily. 180 tablet 1  . atenolol (TENORMIN) 25 MG tablet Take 1 tablet (25 mg total) by mouth daily. Please call to schedule office visit for further refills. 90 tablet 0  . cholecalciferol 25 MCG (1000  UT) TABS Take 1,000 Units by mouth daily.    . rosuvastatin (CRESTOR) 5 MG tablet TAKE 1 TABLET BY MOUTH DAILY USUALLY IN THE EVENING (Patient taking differently: Take 5 mg by mouth every evening. ) 90 tablet 3  . tamsulosin (FLOMAX) 0.4 MG CAPS capsule Take 1 capsule (0.4 mg total) by mouth daily. 30 capsule 0   No current facility-administered medications for this visit.    Allergies:   Codeine, Morphine, and Zyrtec [cetirizine]   Social History:  The patient  reports that she quit smoking about 23 years ago. Her smoking use included cigarettes. She has a 75.00 pack-year smoking history. She has never used smokeless tobacco. She reports that she does not use drugs.   Family History:  The patient's   family history includes Alcohol abuse in her father; Cancer in her brother; Heart disease in her father; Other (age of onset: 55) in her mother.   ROS:  Please see the history of present illness.   All other systems are personally reviewed and negative.    Exam:    Vital Signs:  BP (!) 148/68   Pulse 72   Temp (!) 97.3 F (36.3 C)   Ht 5\' 5"  (1.651 m)   Wt 120 lb (54.4 kg)   BMI 19.97 kg/m     Well appearing, alert and conversant, regular work of breathing,  good skin color Eyes- anicteric, neuro- grossly intact, skin- no apparent rash or lesions or cyanosis, mouth- oral mucosa is pink   Labs/Other Tests and Data Reviewed:    Recent Labs: 10/08/2018: TSH 0.97 12/25/2018: BUN 18; Creatinine, Ser 1.43; Hemoglobin 13.3; Platelets 152; Potassium 3.7; Sodium 138   Wt Readings from Last 3 Encounters:  05/05/19 120 lb (54.4 kg)  02/06/19 120 lb (54.4 kg)  01/14/19 120 lb (54.4 kg)     Other studies personally reviewed: Additional studies/ records that were reviewed today include:   As above   The   Last device remote is reviewed from [facility] PDF dated 4/21 which reveals normal device function,   arrhythmias - atrial fib persisting   ASSESSMENT & PLAN:   Atrial  fibrillation-persistent  Hypertension    Orthostatic hypotension   AV block-complete -intermittent   Grade V3 renal insufficiency  Peripheral vascular disease status post AAA repair  Pacemaker-St. Jude    Lower extremity dependent rubor    Discussed importance of fall protection and avoidance;  Surprised that orthostatic vital signs were not more impressive  No palps  On Anticoagulation;  No bleeding issues    BP elevated but with hx of orthostatic LH, notwithstanding the VS today, would favor letting her BP ride a little high     COVID  19 screen The patient denies symptoms of COVID 19 at this time.  The importance of social distancing was discussed today.  Follow-up:  27m    Current medicines are reviewed at length with the patient today.   The patient does not have concerns regarding her medicines.  The following changes were made today:  none  Labs/ tests ordered today include:   No orders of the defined types were placed in this encounter.   Future tests ( post COVID )     Patient Risk:  after full review of this patients clinical status, I feel that they are at moderate risk at this time.  Today, I have spent 17 minutes with the patient with telehealth technology discussing the above.  Signed, Virl Axe, MD  05/05/2019 9:24 AM     [place] [address] [phone] (office) [phone] (fax)

## 2019-05-11 NOTE — Patient Instructions (Signed)
Medication Instructions:  - Your physician recommends that you continue on your current medications as directed. Please refer to the Current Medication list given to you today.  *If you need a refill on your cardiac medications before your next appointment, please call your pharmacy*   Lab Work: - none ordered  If you have labs (blood work) drawn today and your tests are completely normal, you will receive your results only by: Marland Kitchen MyChart Message (if you have MyChart) OR . A paper copy in the mail If you have any lab test that is abnormal or we need to change your treatment, we will call you to review the results.   Testing/Procedures: - none ordered   Follow-Up: At [facility], you and your health needs are our priority.  As part of our continuing mission to provide you with exceptional heart care, we have created designated Provider Care Teams.  These Care Teams include your primary Cardiologist (physician) and Advanced Practice Providers (APPs -  Physician Assistants and Nurse Practitioners) who all work together to provide you with the care you need, when you need it.  We recommend signing up for the patient portal called "MyChart".  Sign up information is provided on this After Visit Summary.  MyChart is used to connect with patients for Virtual Visits (Telemedicine).  Patients are able to view lab/test results, encounter notes, upcoming appointments, etc.  Non-urgent messages can be sent to your provider as well.   To learn more about what you can do with MyChart, go to [email or web address].    Your next appointment:   6 months  The format for your next appointment:   Either In Person or Virtual  Provider:   Virl Axe, MD   Other Instructions n/a

## 2019-05-30 DIAGNOSIS — E559 Vitamin D deficiency, unspecified: Secondary | ICD-10-CM | POA: Diagnosis not present

## 2019-05-30 DIAGNOSIS — E038 Other specified hypothyroidism: Secondary | ICD-10-CM | POA: Diagnosis not present

## 2019-05-30 DIAGNOSIS — D518 Other vitamin B12 deficiency anemias: Secondary | ICD-10-CM | POA: Diagnosis not present

## 2019-05-30 DIAGNOSIS — E119 Type 2 diabetes mellitus without complications: Secondary | ICD-10-CM | POA: Diagnosis not present

## 2019-06-18 DIAGNOSIS — Z79899 Other long term (current) drug therapy: Secondary | ICD-10-CM | POA: Diagnosis not present

## 2019-06-18 DIAGNOSIS — E119 Type 2 diabetes mellitus without complications: Secondary | ICD-10-CM | POA: Diagnosis not present

## 2019-06-18 DIAGNOSIS — E7849 Other hyperlipidemia: Secondary | ICD-10-CM | POA: Diagnosis not present

## 2019-06-18 DIAGNOSIS — D518 Other vitamin B12 deficiency anemias: Secondary | ICD-10-CM | POA: Diagnosis not present

## 2019-06-18 DIAGNOSIS — E559 Vitamin D deficiency, unspecified: Secondary | ICD-10-CM | POA: Diagnosis not present

## 2019-06-18 DIAGNOSIS — E038 Other specified hypothyroidism: Secondary | ICD-10-CM | POA: Diagnosis not present

## 2019-06-30 ENCOUNTER — Ambulatory Visit (INDEPENDENT_AMBULATORY_CARE_PROVIDER_SITE_OTHER): Payer: Medicare Other | Admitting: *Deleted

## 2019-06-30 DIAGNOSIS — J449 Chronic obstructive pulmonary disease, unspecified: Secondary | ICD-10-CM | POA: Diagnosis not present

## 2019-06-30 DIAGNOSIS — I442 Atrioventricular block, complete: Secondary | ICD-10-CM | POA: Diagnosis not present

## 2019-06-30 DIAGNOSIS — H2513 Age-related nuclear cataract, bilateral: Secondary | ICD-10-CM | POA: Diagnosis not present

## 2019-06-30 DIAGNOSIS — I119 Hypertensive heart disease without heart failure: Secondary | ICD-10-CM | POA: Diagnosis not present

## 2019-06-30 DIAGNOSIS — I4891 Unspecified atrial fibrillation: Secondary | ICD-10-CM | POA: Diagnosis not present

## 2019-06-30 LAB — CUP PACEART REMOTE DEVICE CHECK
Battery Remaining Longevity: 115 mo
Battery Remaining Percentage: 95.5 %
Battery Voltage: 2.99 V
Brady Statistic AP VP Percent: 96 %
Brady Statistic AP VS Percent: 1 %
Brady Statistic AS VP Percent: 3.5 %
Brady Statistic AS VS Percent: 1 %
Brady Statistic RA Percent Paced: 33 %
Brady Statistic RV Percent Paced: 99 %
Date Time Interrogation Session: 20210608053616
Implantable Lead Implant Date: 20090610
Implantable Lead Implant Date: 20090610
Implantable Lead Location: 753859
Implantable Lead Location: 753860
Implantable Lead Model: 5076
Implantable Pulse Generator Implant Date: 20190204
Lead Channel Impedance Value: 390 Ohm
Lead Channel Impedance Value: 430 Ohm
Lead Channel Pacing Threshold Amplitude: 0.75 V
Lead Channel Pacing Threshold Amplitude: 0.75 V
Lead Channel Pacing Threshold Pulse Width: 0.5 ms
Lead Channel Pacing Threshold Pulse Width: 0.5 ms
Lead Channel Sensing Intrinsic Amplitude: 0.6 mV
Lead Channel Sensing Intrinsic Amplitude: 4.2 mV
Lead Channel Setting Pacing Amplitude: 1 V
Lead Channel Setting Pacing Amplitude: 2 V
Lead Channel Setting Pacing Pulse Width: 0.5 ms
Lead Channel Setting Sensing Sensitivity: 2 mV
Pulse Gen Model: 2272
Pulse Gen Serial Number: 8991929

## 2019-07-01 NOTE — Progress Notes (Signed)
Remote pacemaker transmission.   

## 2019-07-22 ENCOUNTER — Encounter: Payer: Self-pay | Admitting: Urology

## 2019-07-22 ENCOUNTER — Other Ambulatory Visit: Payer: Self-pay

## 2019-07-22 ENCOUNTER — Ambulatory Visit: Payer: Medicare Other | Admitting: Urology

## 2019-07-22 VITALS — BP 156/84 | HR 69 | Ht 65.0 in | Wt 122.0 lb

## 2019-07-22 DIAGNOSIS — N200 Calculus of kidney: Secondary | ICD-10-CM | POA: Diagnosis not present

## 2019-07-22 DIAGNOSIS — E038 Other specified hypothyroidism: Secondary | ICD-10-CM | POA: Diagnosis not present

## 2019-07-22 DIAGNOSIS — C641 Malignant neoplasm of right kidney, except renal pelvis: Secondary | ICD-10-CM

## 2019-07-22 DIAGNOSIS — E559 Vitamin D deficiency, unspecified: Secondary | ICD-10-CM | POA: Diagnosis not present

## 2019-07-22 DIAGNOSIS — I119 Hypertensive heart disease without heart failure: Secondary | ICD-10-CM | POA: Diagnosis not present

## 2019-07-22 DIAGNOSIS — D518 Other vitamin B12 deficiency anemias: Secondary | ICD-10-CM | POA: Diagnosis not present

## 2019-07-22 DIAGNOSIS — E119 Type 2 diabetes mellitus without complications: Secondary | ICD-10-CM | POA: Diagnosis not present

## 2019-07-22 NOTE — Progress Notes (Signed)
° °  07/22/2019 10:49 AM   Jessica [name] [birth year] [id number]  Reason for visit: Follow up right upper tract urothelial cell carcinoma, nephrolithiasis  HPI: I saw Jessica [name] and her son back in urology clinic today for follow-up.  To briefly summarize she is a very comorbid 86 year old female with worsening dementia who was recently been moved to an assisted living facility who has an atrophic left kidney, and known filling defect in the right lower pole kidney since at least 2017 and had originally opted for observation instead of biopsy or further evaluation with her comorbidities and anticoagulation.  She was found to have a 4 mm right UPJ stone in December 2020 and underwent right ureteroscopy, removal of stone, biopsy of right lower pole papillary tumor showing low-grade noninvasive urothelial cell carcinoma.  I had numerous conversations with both the patient and her son about options including nephroureterectomy(would likely result in ESRD and need for dialysis based on her atrophic left kidney), ureteroscopy and laser ablation, or observation, and they have opted for observation multiple times with her numerous comorbidities and worsening overall health.  She denies any complaints over the last 6 months.  A renal ultrasound was ordered prior to the visit today, but this was not completed.  She did move to an assisted living facility recently secondary to worsening dementia.  She denies any hematuria, urinary symptoms, flank pain.  Overall, she feels she is doing well from a urologic perspective.  We again reviewed the options, and both the patient and her son are in agreement to move forward with more of a watchful waiting approach.  I am not sure repeating imaging on a regular basis would be helpful for her, she is very resistant to any surgical interventions unless absolutely needed.  We discussed return precautions at length including gross hematuria, flank pain, or UTIs.  RTC 9 months  symptom check  I spent 35 total minutes on the day of the encounter including pre-visit review of the medical record, face-to-face time with the patient, and post visit ordering of labs/imaging/tests.    Billey Co, [facility] [address], [place], [place] [address] [phone]

## 2019-07-28 ENCOUNTER — Ambulatory Visit: Payer: Medicare Other | Admitting: Family

## 2019-07-28 ENCOUNTER — Encounter: Payer: Self-pay | Admitting: Family

## 2019-07-28 ENCOUNTER — Other Ambulatory Visit: Payer: Self-pay

## 2019-07-28 VITALS — BP 156/70 | HR 62 | Ht 65.0 in | Wt 123.4 lb

## 2019-07-28 DIAGNOSIS — K5900 Constipation, unspecified: Secondary | ICD-10-CM | POA: Diagnosis not present

## 2019-07-28 DIAGNOSIS — R0600 Dyspnea, unspecified: Secondary | ICD-10-CM | POA: Diagnosis not present

## 2019-07-28 DIAGNOSIS — I4819 Other persistent atrial fibrillation: Secondary | ICD-10-CM | POA: Diagnosis not present

## 2019-07-28 DIAGNOSIS — Z950 Presence of cardiac pacemaker: Secondary | ICD-10-CM

## 2019-07-28 DIAGNOSIS — E782 Mixed hyperlipidemia: Secondary | ICD-10-CM

## 2019-07-28 DIAGNOSIS — I119 Hypertensive heart disease without heart failure: Secondary | ICD-10-CM | POA: Diagnosis not present

## 2019-07-28 DIAGNOSIS — J449 Chronic obstructive pulmonary disease, unspecified: Secondary | ICD-10-CM | POA: Diagnosis not present

## 2019-07-28 DIAGNOSIS — I951 Orthostatic hypotension: Secondary | ICD-10-CM

## 2019-07-28 DIAGNOSIS — R0609 Other forms of dyspnea: Secondary | ICD-10-CM

## 2019-07-28 DIAGNOSIS — I10 Essential (primary) hypertension: Secondary | ICD-10-CM

## 2019-07-28 DIAGNOSIS — R413 Other amnesia: Secondary | ICD-10-CM | POA: Diagnosis not present

## 2019-07-28 MED ORDER — FUROSEMIDE 20 MG PO TABS: 20.0000 mg | ORAL_TABLET | ORAL | 6 refills | Status: AC | PRN

## 2019-07-28 NOTE — Patient Instructions (Signed)
Medication Instructions:  Your physician has recommended you make the following change in your medication:   START Lasix 20mg  daily for 4 days   THEN transition to Lasix 20mg  daily as-needed for worsening shortness of breath  *If you need a refill on your cardiac medications before your next appointment, please call your pharmacy*   Lab Work: No lab work today.   Testing/Procedures: Your EKG today shows that your pacemaker is functioning appropriately.  Follow-Up: At [facility], you and your health needs are our priority.  As part of our continuing mission to provide you with exceptional heart care, we have created designated Provider Care Teams.  These Care Teams include your primary Cardiologist (physician) and Advanced Practice Providers (APPs -  Physician Assistants and Nurse Practitioners) who all work together to provide you with the care you need, when you need it.  We recommend signing up for the patient portal called "MyChart".  Sign up information is provided on this After Visit Summary.  MyChart is used to connect with patients for Virtual Visits (Telemedicine).  Patients are able to view lab/test results, encounter notes, upcoming appointments, etc.  Non-urgent messages can be sent to your provider as well.   To learn more about what you can do with MyChart, go to [email or web address].    Your next appointment:   With Dr. Caryl Comes in October as previously scheduled.  In 6 months with Dr. Rockey Situ   Other Instructions  The Lasix is to help with the elevated pressures in your lungs.

## 2019-07-28 NOTE — Progress Notes (Signed)
Office Visit    Patient Name: Jessica [name] Date of Encounter: 07/28/2019  Primary Care Provider:  Leone Haven, MD Primary Cardiologist:  Ida Rogue, MD Electrophysiologist:  None   Chief Complaint    Jessica [name] is a 86 y.o. female with a hx of persistent fibrillation, complete heart block s/p PPM, HTN/orthostatic hypotension, renal artery stenosis s/p renal artery stenting 10/2017, PVD s/p AAA repair 2009, COPD, HLD, bialteral carotid artery stenosis (mild) presents today for follow up of persistent atrial fibrillation and orthostatic hypotension.   Past Medical History    Past Medical History:  Diagnosis Date  . AAA (abdominal aortic aneurysm) ([place])   . Atrioventricular block, complete ([place])   . Cardiac pacemaker in situ   . COPD (chronic obstructive pulmonary disease) ([place])   . Gout   . Hernia   . Hyperlipidemia   . Hypertension   . Kidney stone   . Presence of permanent cardiac pacemaker   . Rectal bleeding    Past Surgical History:  Procedure Laterality Date  . ABDOMINAL AORTIC ANEURYSM REPAIR     son denies  . CARDIOVERSION N/A 02/06/2019   Procedure: CARDIOVERSION;  Surgeon: Minna Merritts, MD;  Location: [facility] ORS;  Service: Cardiovascular;  Laterality: N/A;  . CYSTOSCOPY/URETEROSCOPY/HOLMIUM LASER/STENT PLACEMENT Right 01/01/2019   Procedure: CYSTOSCOPY/URETEROSCOPY/HOLMIUM LASER/STENT PLACEMENT;  Surgeon: Billey Co, MD;  Location: [facility] ORS;  Service: Urology;  Laterality: Right;  . HERNIA REPAIR    . ohter     growth on colon surgery  . PPM GENERATOR CHANGEOUT N/A 02/25/2017   Procedure: PPM GENERATOR CHANGEOUT;  Surgeon: Deboraha Sprang, MD;  Location: [place] [facility];  Service: Cardiovascular;  Laterality: N/A;  . RENAL ANGIOGRAPHY Right 12/03/2017   Procedure: RENAL ANGIOGRAPHY;  Surgeon: Katha Cabal, MD;  Location: [place] [facility];  Service: Cardiovascular;  Laterality: Right;  . TONSILLECTOMY       Allergies  Allergies  Allergen Reactions  . Codeine Other (See Comments)    Made her feel crazy  . Morphine Other (See Comments)    Made her feel crazy  . Zyrtec [Cetirizine] Other (See Comments)    Causes facial numbness    History of Present Illness    Jessica [name] is a 86 y.o. female with a hx of persistent fibrillation, complete heart block s/p PPM, HTN/orthostatic hypotension, renal artery stenosis s/p renal artery stenting 10/2017, PVD s/p AAA repair 2009, COPD, HLD, bialteral carotid artery stenosis (mild), dementia, nephrolithiasis/atrophic left kidney with known filling defect in right lower pole kidney since 2017 (right upper tract urothelial cell carcinoma).  She was last seen via video visit by Dr. Caryl Comes 04/2019.  Previously ischemia eval includes Lexiscan Myoview 07/2016 with no evidence of ischemia, normal LVEF.  Echocardiogram 08/2016 with LVEF 65%, moderate LVH, no significant valvular abnormalities.  Present today with a family member.  She reports worsening dyspnea with exertion over the last 3 months.  Denies orthopnea, PND.  She has been using her albuterol inhaler twice daily with good response.  Notes no wheeze or cough.  Lives in an assisted living facility who monitors her blood pressure carefully tells me it is typically in the 761 systolic.  Does still endorse intermittent lightheadedness with position changes and tells me she is very fearful of falling and does try to make position changes carefully.  Reports no chest pain, pressure, tightness.  Reports no shortness of breath at rest.  Denies palpitations.  EKGs/Labs/Other  Studies Reviewed:   The following studies were reviewed today:  Echo 2018 Study Conclusions   - Technically difficult study due to chest wall and/or lung    interference.  - Left ventricle: The cavity size was normal. Wall thickness was    increased in a pattern of moderate LVH. Systolic function was    normal. The estimated ejection  fraction was in the range of 60%    to 65%. Regional wall motion abnormalities cannot be excluded.    The study is not technically sufficient to allow evaluation of LV    diastolic function.  - Aortic valve: Poorly visualized. Moderately thickened, moderately    calcified leaflets. Sclerosis without stenosis.  - Left atrium: The atrium was mildly dilated.  - Right ventricle: The cavity size was normal. Pacer wire or    catheter noted in right ventricle.  - Atrial septum: There was increased thickness of the septum,    consistent with lipomatous hypertrophy.  - Tricuspid valve: There was mild regurgitation.  - Pulmonary arteries: Systolic pressure was mildly to moderately    increased, in the range of 40 mm Hg to 45 mm Hg.   EKG:  EKG is ordered today.  The ekg ordered today demonstrates AV-dual paced rhythm 62 bpm  Recent Labs: 10/08/2018: TSH 0.97 12/25/2018: BUN 18; Creatinine, Ser 1.43; Hemoglobin 13.3; Platelets 152; Potassium 3.7; Sodium 138  Recent Lipid Panel    Component Value Date/Time   CHOL 143 08/30/2017 1028   CHOL 181 01/03/2012 0837   TRIG 98.0 08/30/2017 1028   HDL 57.80 08/30/2017 1028   HDL 61 01/03/2012 0837   CHOLHDL 2 08/30/2017 1028   VLDL 19.6 08/30/2017 1028   LDLCALC 65 08/30/2017 1028   LDLCALC 84 01/03/2012 0837   LDLDIRECT 186.2 02/22/2011 1414    Home Medications   Current Meds  Medication Sig  . acetaminophen (TYLENOL) 500 MG tablet Take 500 mg by mouth every 6 (six) hours as needed for moderate pain or headache.  . albuterol (PROAIR HFA) 108 (90 Base) MCG/ACT inhaler Inhale 2 puffs into the lungs every 6 (six) hours as needed for wheezing or shortness of breath.  Marland Kitchen amLODipine (NORVASC) 5 MG tablet Take 1 tablet (5 mg total) by mouth daily. Please call to schedule office visit for further refills.  Marland Kitchen apixaban (ELIQUIS) 2.5 MG TABS tablet Take 1 tablet (2.5 mg total) by mouth 2 (two) times daily.  Marland Kitchen atenolol (TENORMIN) 25 MG tablet Take 1 tablet  (25 mg total) by mouth daily. Please call to schedule office visit for further refills.  . cholecalciferol 25 MCG (1000 UT) TABS Take 1,000 Units by mouth daily.  . clopidogrel (PLAVIX) 75 MG tablet Take 1 tablet (75 mg) by mouth once daily  . melatonin 3 MG TABS tablet Take 1 tablet (3 mg) by mouth once daily at bedtime as needed for sleep  . rosuvastatin (CRESTOR) 5 MG tablet TAKE 1 TABLET BY MOUTH DAILY USUALLY IN THE EVENING  . senna-docusate (SENOKOT-S) 8.6-50 MG tablet Take 2 tablets by mouth once daily at bedtime as needed for constipation  . tamsulosin (FLOMAX) 0.4 MG CAPS capsule Take 1 capsule (0.4 mg total) by mouth daily.  . vitamin B-12 (CYANOCOBALAMIN) 1000 MCG tablet Take 1 tablet (1000 mg) by mouth once daily at bedtime    Review of Systems      Review of Systems  Constitutional: Negative for chills, fever and malaise/fatigue.  Cardiovascular: Positive for dyspnea on exertion. Negative for chest pain,  irregular heartbeat, leg swelling, near-syncope, orthopnea, palpitations and syncope.  Respiratory: Negative for cough, shortness of breath and wheezing.   Gastrointestinal: Negative for melena, nausea and vomiting.  Genitourinary: Negative for hematuria.  Neurological: Negative for dizziness, light-headedness and weakness.   All other systems reviewed and are otherwise negative except as noted above.  Physical Exam    VS:  BP (!) 156/70 (BP Location: Left Arm, Patient Position: Sitting, Cuff Size: Normal)   Pulse 62   Ht 5\' 5"  (1.651 m)   Wt 123 lb 6 oz (56 kg)   BMI 20.53 kg/m  , BMI Body mass index is 20.53 kg/m. GEN: Well nourished, well developed, in no acute distress. HEENT: normal. Neck: Supple, no JVD, carotid bruits, or masses. Cardiac: RRR, no murmurs, rubs, or gallops. No clubbing, cyanosis, edema.  Radials/PT 2+ and equal bilaterally.  Respiratory:  Respirations regular and unlabored, clear to auscultation bilaterally. GI: Soft, nontender,  nondistended, MS: No deformity or atrophy. Skin: Warm and dry, no rash. Neuro:  Strength and sensation are intact. Psych: Normal affect.  Assessment & Plan    1. DOE -reports worsening DOE over the last 3 months.  No orthopnea, PND, edema.  Likely multifactorial deconditioning, elevated pulmonary artery pressures, COPD.  Echo 2018 with normal LVEF, mild to moderately elevated PASP, no significant valvular abnormalities.  Start Lasix 20 mg daily for 4 days then transition to Lasix 20 mg as needed for shortness of breath.  Encouraged to discuss transition to maintenance inhaler with her primary care provider she has been using albuterol twice daily.  No adventitious lung sounds on exam. As she reports no LE edema, orthopnea, PND will defer repeat echocardiogram at this time.  2. CHB s/p PPM -continue to follow with Dr. Caryl Comes.  Remote device check 06/30/2019 with AF burden 79% with controlled rates.  3. Persistent atrial fibrillation/Chronic anticoagulation -recent device check with 79% AF burden.  Reports no palpitations.  Denies bleeding complications on Eliquis 2.5 mg twice daily.  Reduced dose due to age/body habitus.  4. HTN/orthostatic hypotension -reports very intermittent symptoms of lightheadedness with position changes.  Tells me she is fearful of falling and uses her Rollator regularly. Add Lasix 20mg , as above.  Defer additional changes to her antihypertensive regimen with allowance for some permissive HTN in the setting of orthostasis.  5. HLD - Continue Crestor 5 mg daily.  6. PAD/PVD s/p AAA repair by [facility] -reports no symptoms concerning for worsening claudication.  Continue to follow with vascular surgery.  7. COPD -continue to follow with primary care.  Anticipate her dyspnea on exertion is multifactorial including COPD.  She is using albuterol twice daily.  Encouraged her to discuss maintenance inhaler therapy with her primary care provider.  Disposition: Follow up in 3 months with  Dr. Caryl Comes as previously scheduled and in 6 month(s) with Dr. Wallie Renshaw, NP 07/28/2019, 9:28 AM

## 2019-08-03 DIAGNOSIS — I119 Hypertensive heart disease without heart failure: Secondary | ICD-10-CM | POA: Diagnosis not present

## 2019-08-03 DIAGNOSIS — E119 Type 2 diabetes mellitus without complications: Secondary | ICD-10-CM | POA: Diagnosis not present

## 2019-08-03 DIAGNOSIS — D518 Other vitamin B12 deficiency anemias: Secondary | ICD-10-CM | POA: Diagnosis not present

## 2019-08-03 DIAGNOSIS — E559 Vitamin D deficiency, unspecified: Secondary | ICD-10-CM | POA: Diagnosis not present

## 2019-08-03 DIAGNOSIS — E038 Other specified hypothyroidism: Secondary | ICD-10-CM | POA: Diagnosis not present

## 2019-08-05 ENCOUNTER — Ambulatory Visit: Payer: Medicare Other

## 2019-08-05 ENCOUNTER — Ambulatory Visit (INDEPENDENT_AMBULATORY_CARE_PROVIDER_SITE_OTHER): Payer: Medicare Other

## 2019-08-05 VITALS — Ht 65.0 in | Wt 123.0 lb

## 2019-08-05 DIAGNOSIS — Z0000 Encounter for general adult medical examination without abnormal findings: Secondary | ICD-10-CM | POA: Diagnosis not present

## 2019-08-05 NOTE — Progress Notes (Signed)
Subjective:   Jessica [name] is a 86 y.o. female who presents for Medicare Annual (Subsequent) preventive examination.  Review of Systems    No ROS.  Medicare Wellness Virtual Visit.     Cardiac Risk Factors include: advanced age (>63men, >29 women);hypertension     Objective:    Today's Vitals   08/05/19 0938  Weight: 123 lb (55.8 kg)  Height: 5\' 5"  (1.651 m)   Body mass index is 20.47 kg/m.  Advanced Directives 08/05/2019 02/06/2019 01/01/2019 12/31/2018 12/25/2018 08/04/2018 01/21/2018  Does Patient Have a Medical Advance Directive? Yes Yes Yes Yes No Yes Yes  Type of Paramedic of [place];Living will - [place];Living will - - [place];Living will [place];Living will  Does patient want to make changes to medical advance directive? No - Patient declined No - Patient declined - No - Patient declined - No - Patient declined No - Patient declined  Copy of [place] in Chart? Yes - validated most recent copy scanned in chart (See row information) - No - copy requested - - Yes - validated most recent copy scanned in chart (See row information) No - copy requested  Would patient like information on creating a medical advance directive? - - - - - - -  Pre-existing out of facility DNR order (yellow form or pink MOST form) - - - - - - -    Current Medications (verified) Outpatient Encounter Medications as of 08/05/2019  Medication Sig  . acetaminophen (TYLENOL) 500 MG tablet Take 500 mg by mouth every 6 (six) hours as needed for moderate pain or headache.  . albuterol (PROAIR HFA) 108 (90 Base) MCG/ACT inhaler Inhale 2 puffs into the lungs every 6 (six) hours as needed for wheezing or shortness of breath.  Marland Kitchen amLODipine (NORVASC) 5 MG tablet Take 1 tablet (5 mg total) by mouth daily. Please call to schedule office visit for further refills.  Marland Kitchen apixaban (ELIQUIS) 2.5 MG TABS tablet Take  1 tablet (2.5 mg total) by mouth 2 (two) times daily.  Marland Kitchen atenolol (TENORMIN) 25 MG tablet Take 1 tablet (25 mg total) by mouth daily. Please call to schedule office visit for further refills.  . cholecalciferol 25 MCG (1000 UT) TABS Take 1,000 Units by mouth daily.  . clopidogrel (PLAVIX) 75 MG tablet Take 1 tablet (75 mg) by mouth once daily  . furosemide (LASIX) 20 MG tablet Take 1 tablet (20 mg total) by mouth as needed (for shortness of breath/edema).  . melatonin 3 MG TABS tablet Take 1 tablet (3 mg) by mouth once daily at bedtime as needed for sleep  . rosuvastatin (CRESTOR) 5 MG tablet TAKE 1 TABLET BY MOUTH DAILY USUALLY IN THE EVENING  . senna-docusate (SENOKOT-S) 8.6-50 MG tablet Take 2 tablets by mouth once daily at bedtime as needed for constipation  . tamsulosin (FLOMAX) 0.4 MG CAPS capsule Take 1 capsule (0.4 mg total) by mouth daily.  . vitamin B-12 (CYANOCOBALAMIN) 1000 MCG tablet Take 1 tablet (1000 mg) by mouth once daily at bedtime  . [DISCONTINUED] citalopram (CELEXA) 10 MG tablet Take 1 tablet (10 mg total) by mouth daily.   No facility-administered encounter medications on file as of 08/05/2019.    Allergies (verified) Codeine, Morphine, and Zyrtec [cetirizine]   History: Past Medical History:  Diagnosis Date  . AAA (abdominal aortic aneurysm) ([place])   . Atrioventricular block, complete ([place])   . Cardiac pacemaker in situ   .  COPD (chronic obstructive pulmonary disease) ([place])   . Gout   . Hernia   . Hyperlipidemia   . Hypertension   . Kidney stone   . Presence of permanent cardiac pacemaker   . Rectal bleeding    Past Surgical History:  Procedure Laterality Date  . ABDOMINAL AORTIC ANEURYSM REPAIR     son denies  . CARDIOVERSION N/A 02/06/2019   Procedure: CARDIOVERSION;  Surgeon: Minna Merritts, MD;  Location: [facility];  Service: Cardiovascular;  Laterality: N/A;  . CYSTOSCOPY/URETEROSCOPY/HOLMIUM LASER/STENT PLACEMENT Right 01/01/2019   Procedure:  CYSTOSCOPY/URETEROSCOPY/HOLMIUM LASER/STENT PLACEMENT;  Surgeon: Billey Co, MD;  Location: [facility] OR[facility];  Service: Urology;  Laterality: Right;  . HERNIA REPAIR    . ohter     growth on colon surgery  . PPM GENERATOR CHANGEOUT N/A 02/25/2017   Procedure: PPM GENERATOR CHANGEOUT;  Surgeon: Deboraha Sprang, MD;  Location: [place] [facility];  Service: Cardiovascular;  Laterality: N/A;  . RENAL ANGIOGRAPHY Right 12/03/2017   Procedure: RENAL ANGIOGRAPHY;  Surgeon: Katha Cabal, MD;  Location: [place] [facility];  Service: Cardiovascular;  Laterality: Right;  . TONSILLECTOMY     Family History  Problem Relation Age of Onset  . Heart disease Father   . Alcohol abuse Father   . Cancer Brother   . Other Mother 34       MVA   Social History   Socioeconomic History  . Marital status: Widowed    Spouse name: Not on file  . Number of children: 2  . Years of education: Not on file  . Highest education level: Not on file  Occupational History  . Occupation: Retired    Fish farm manager: RETIRED  Tobacco Use  . Smoking status: Former Smoker    Packs/day: 1.50    Years: 50.00    Pack years: 75.00    Types: Cigarettes    Quit date: 10/03/1995    Years since quitting: 23.8  . Smokeless tobacco: Never Used  Vaping Use  . Vaping Use: Never used  Substance and Sexual Activity  . Alcohol use: Not Currently  . Drug use: No  . Sexual activity: Not Currently  Other Topics Concern  . Not on file  Social History Narrative   Born in [place]. Lives in [place]. Son lives nearby.      No regular exercise    Social Determinants of Health   Financial Resource Strain: Low Risk   . Difficulty of Paying Living Expenses: Not hard at all  Food Insecurity:   . Worried About Charity fundraiser in the Last Year:   . Arboriculturist in the Last Year:   Transportation Needs:   . Film/video editor (Medical):   Marland Kitchen Lack of Transportation (Non-Medical):   Physical Activity:   . Days of Exercise per  Week:   . Minutes of Exercise per Session:   Stress:   . Feeling of Stress :   Social Connections: Unknown  . Frequency of Communication with Friends and Family: More than three times a week  . Frequency of Social Gatherings with Friends and Family: Once a week  . Attends Religious Services: 1 to 4 times per year  . Active Member of Clubs or Organizations: Yes  . Attends Archivist Meetings: 1 to 4 times per year  . Marital Status: Not on file    Tobacco Counseling Counseling given: Not Answered   Clinical Intake:  Pre-visit preparation completed: Yes  Diabetes: No  How often do you need to have someone help you when you read instructions, pamphlets, or other written materials from your doctor or pharmacy?: 1 - Never  Interpreter Needed?: No      Activities of Daily Living In your present state of health, do you have any difficulty performing the following activities: 08/05/2019 02/06/2019  Hearing? N N  Vision? N N  Difficulty concentrating or making decisions? Y N  Walking or climbing stairs? Y N  Comment Unsteady gait, walker in use -  Dressing or bathing? Aggie Moats  Comment Staff assist -  Doing errands, shopping? Y -  Comment She does not drive Facilities manager and eating ? Y -  Comment Staff assist with meal prep. Self feeds. -  Using the Toilet? N -  In the past six months, have you accidently leaked urine? N -  Do you have problems with loss of bowel control? N -  Managing your Medications? Y -  Comment Staff assist -  Managing your Finances? Y -  Comment Son assist -  Housekeeping or managing your Housekeeping? Y -  Comment Staff assist -  Some recent data might be hidden    Patient Care Team: Leone Haven, MD as PCP - General (Family Medicine) Minna Merritts, MD as PCP - Cardiology (Cardiology) Deboraha Sprang, MD (Cardiology) Minna Merritts, MD (Cardiology)  Indicate any recent Medical Services you may have received from  other than [facility] providers in the past year (date may be approximate).     Assessment:   This is a routine wellness examination for Candiace.  I connected with Gelila today by telephone and verified that I am speaking with the correct person using two identifiers. Location patient: home Location provider: work Persons participating in the virtual visit: patient, Marine scientist.    I discussed the limitations, risks, security and privacy concerns of performing an evaluation and management service by telephone and the availability of in person appointments. The patient expressed understanding and verbally consented to this telephonic visit.    Interactive audio and video telecommunications were attempted between this provider and patient, however failed, due to patient having technical difficulties OR patient did not have access to video capability.  We continued and completed visit with audio only.  Some vital signs may be absent or patient reported.   Hearing/Vision screen  Hearing Screening   125Hz  250Hz  500Hz  1000Hz  2000Hz  3000Hz  4000Hz  6000Hz  8000Hz   Right ear:           Left ear:           Comments: Patient is able to hear conversational tones without difficulty.  No issues reported.  Vision Screening Comments: Followed by Suzie Portela Wears corrective lenses Visual acuity not assessed, virtual visit.  They have seen their ophthalmologist in the last 12 months.   Dietary issues and exercise activities discussed: Current Exercise Habits: Home exercise routine, Type of exercise: walking (Walking outdoors), Time (Minutes): 10, Frequency (Times/Week): 1, Weekly Exercise (Minutes/Week): 10, Intensity: MildHealthy diet Good water intake Caffeine- 1 cup of coffee  Goals      Patient Stated   .  Follow up with Primary Care Provider (pt-stated)      As needed      Other   .  Increase physical activity      Chair/standing exercises daily while holding onto walker for balance.  Walk for exercise  as tolerated       Depression Screen PHQ  2/9 Scores 08/05/2019 01/14/2019 10/14/2018 10/08/2018 08/14/2018 08/04/2018 03/06/2017  PHQ - 2 Score 0 0 0 3 0 0 0  PHQ- 9 Score - - 0 15 0 - -    Fall Risk Fall Risk  08/05/2019 01/14/2019 10/14/2018 10/08/2018 08/14/2018  Falls in the past year? 0 0 0 0 0  Number falls in past yr: - 0 0 0 0  Injury with Fall? - 0 0 0 0  Follow up Falls evaluation completed Falls evaluation completed Falls evaluation completed Falls evaluation completed Falls evaluation completed   Any stairs in or around the home? No  Home free of loose throw rugs in walkways, pet beds, electrical cords, etc? Yes  Adequate lighting in your home to reduce risk of falls? Yes   ASSISTIVE DEVICES UTILIZED TO PREVENT FALLS:  Life alert? No  Use of a cane, walker or w/c? Yes  Grab bars in the bathroom? Yes  Shower chair or bench in shower? Yes  Elevated toilet seat or a handicapped toilet? Yes   TIMED UP AND GO:  Was the test performed? No .    Cognitive Function: MMSE - Mini Mental State Exam 03/05/2016 03/04/2015  Orientation to time 5 5  Orientation to Place 5 5  Registration 3 3  Attention/ Calculation 5 5  Recall 3 3  Language- name 2 objects 2 2  Language- repeat 1 1  Language- follow 3 step command 3 3  Language- read & follow direction 1 1  Write a sentence 1 1  Copy design 1 1  Total score 30 30     6CIT Screen 08/05/2019 08/04/2018  What Year? 0 points 0 points  What month? 0 points 0 points  What time? 0 points 0 points  Count back from 20 0 points 0 points  Months in reverse 0 points 0 points  Repeat phrase 2 points 4 points  Total Score 2 4    Immunizations Immunization History  Administered Date(s) Administered  . Fluad Quad(high Dose 65+) 10/08/2018  . Influenza Split 12/13/2010, 09/27/2011, 12/13/2013  . Influenza Whole 11/28/2009  . Influenza, High Dose Seasonal PF 11/26/2012  . Influenza, Seasonal, Injecte, Preservative Fre 11/23/2014  .  Influenza,inj,Quad PF,6+ Mos 10/16/2016, 11/13/2017  . Influenza-Unspecified 09/27/2015, 11/13/2017  . PFIZER SARS-COV-2 Vaccination 06/01/2019, 06/22/2019  . Pneumococcal Conjugate-13 04/22/2013  . Pneumococcal Polysaccharide-23 08/27/2006   Health Maintenance Health Maintenance  Topic Date Due  . TETANUS/TDAP  Never done  . DEXA SCAN  Never done  . URINE MICROALBUMIN  01/30/2014  . INFLUENZA VACCINE  08/23/2019  . COVID-19 Vaccine  Completed  . PNA vac Low Risk Adult  Completed   Tdap-deferred, per patient preference Dexa Scan- deferred, per patient preference Urine microalbumin- deferred, per patient preference  Health Maintenance Due  Topic Date Due  . TETANUS/TDAP  Never done  . DEXA SCAN  Never done  . URINE MICROALBUMIN  01/30/2014   Dental Screening: Recommended annual dental exams for proper oral hygiene.  Community Resource Referral / Chronic Care Management: CRR required this visit?  No   CCM required this visit?  No      Plan:   I have personally reviewed and noted the following in the patient's chart:   . Medical and social history . Use of alcohol, tobacco or illicit drugs  . Current medications and supplements . Functional ability and status . Nutritional status . Physical activity . Advanced directives . List of other physicians . Hospitalizations, surgeries, and ER visits in  previous 12 months . Vitals . Screenings to include cognitive, depression, and falls . Referrals and appointments  In addition, I have reviewed and discussed with patient certain preventive protocols, quality metrics, and best practice recommendations. A written personalized care plan for preventive services as well as general preventive health recommendations were provided to patient via mail.     Varney Biles, LPN   07/28/8673

## 2019-08-05 NOTE — Patient Instructions (Addendum)
Jessica [name] , Thank you for taking time to come for your Medicare Wellness Visit. I appreciate your ongoing commitment to your health goals. Please review the following plan we discussed and let me know if I can assist you in the future.   These are the goals we discussed: Goals      Patient Stated   .  Follow up with Primary Care Provider (pt-stated)      As needed      Other   .  Increase physical activity      Chair/standing exercises daily while holding onto walker for balance.  Walk for exercise as tolerated        This is a list of the screening recommended for you and due dates:  Health Maintenance  Topic Date Due  . Tetanus Vaccine  Never done  . DEXA scan (bone density measurement)  Never done  . Urine Protein Check  01/30/2014  . Flu Shot  08/23/2019  . COVID-19 Vaccine  Completed  . Pneumonia vaccines  Completed    Immunizations Immunization History  Administered Date(s) Administered  . Fluad Quad(high Dose 65+) 10/08/2018  . Influenza Split 12/13/2010, 09/27/2011, 12/13/2013  . Influenza Whole 11/28/2009  . Influenza, High Dose Seasonal PF 11/26/2012  . Influenza, Seasonal, Injecte, Preservative Fre 11/23/2014  . Influenza,inj,Quad PF,6+ Mos 10/16/2016, 11/13/2017  . Influenza-Unspecified 09/27/2015, 11/13/2017  . Pneumococcal Conjugate-13 04/22/2013  . Pneumococcal Polysaccharide-23 08/27/2006   Advanced directives: yes on file  Conditions/risks identified: none new  Follow up in one year for your annual wellness visit    Preventive Care 65 Years and Older, Female Preventive care refers to lifestyle choices and visits with your health care provider that can promote health and wellness. What does preventive care include?  A yearly physical exam. This is also called an annual well check.  Dental exams once or twice a year.  Routine eye exams. Ask your health care provider how often you should have your eyes checked.  Personal lifestyle choices,  including:  Daily care of your teeth and gums.  Regular physical activity.  Eating a healthy diet.  Avoiding tobacco and drug use.  Limiting alcohol use.  Practicing safe sex.  Taking low-dose aspirin every day.  Taking vitamin and mineral supplements as recommended by your health care provider. What happens during an annual well check? The services and screenings done by your health care provider during your annual well check will depend on your age, overall health, lifestyle risk factors, and family history of disease. Counseling  Your health care provider may ask you questions about your:  Alcohol use.  Tobacco use.  Drug use.  Emotional well-being.  Home and relationship well-being.  Sexual activity.  Eating habits.  History of falls.  Memory and ability to understand (cognition).  Work and work Statistician.  Reproductive health. Screening  You may have the following tests or measurements:  Height, weight, and BMI.  Blood pressure.  Lipid and cholesterol levels. These may be checked every 5 years, or more frequently if you are over 93 years old.  Skin check.  Lung cancer screening. You may have this screening every year starting at age 62 if you have a 30-pack-year history of smoking and currently smoke or have quit within the past 15 years.  Fecal occult blood test (FOBT) of the stool. You may have this test every year starting at age 42.  Flexible sigmoidoscopy or colonoscopy. You may have a sigmoidoscopy every 5 years or a  colonoscopy every 10 years starting at age 60.  Hepatitis C blood test.  Hepatitis B blood test.  Sexually transmitted disease (STD) testing.  Diabetes screening. This is done by checking your blood sugar (glucose) after you have not eaten for a while (fasting). You may have this done every 1-3 years.  Bone density scan. This is done to screen for osteoporosis. You may have this done starting at age 16.  Mammogram. This  may be done every 1-2 years. Talk to your health care provider about how often you should have regular mammograms. Talk with your health care provider about your test results, treatment options, and if necessary, the need for more tests. Vaccines  Your health care provider may recommend certain vaccines, such as:  Influenza vaccine. This is recommended every year.  Tetanus, diphtheria, and acellular pertussis (Tdap, Td) vaccine. You may need a Td booster every 10 years.  Zoster vaccine. You may need this after age 60.  Pneumococcal 13-valent conjugate (PCV13) vaccine. One dose is recommended after age 60.  Pneumococcal polysaccharide (PPSV23) vaccine. One dose is recommended after age 15. Talk to your health care provider about which screenings and vaccines you need and how often you need them. This information is not intended to replace advice given to you by your health care provider. Make sure you discuss any questions you have with your health care provider. Document Released: 02/04/2015 Document Revised: 09/28/2015 Document Reviewed: 11/09/2014 Elsevier Interactive Patient Education  2017 [place] Prevention in the Home Falls can cause injuries. They can happen to people of all ages. There are many things you can do to make your home safe and to help prevent falls. What can I do on the outside of my home?  Regularly fix the edges of walkways and driveways and fix any cracks.  Remove anything that might make you trip as you walk through a door, such as a raised step or threshold.  Trim any bushes or trees on the path to your home.  Use bright outdoor lighting.  Clear any walking paths of anything that might make someone trip, such as rocks or tools.  Regularly check to see if handrails are loose or broken. Make sure that both sides of any steps have handrails.  Any raised decks and porches should have guardrails on the edges.  Have any leaves, snow, or ice cleared  regularly.  Use sand or salt on walking paths during winter.  Clean up any spills in your garage right away. This includes oil or grease spills. What can I do in the bathroom?  Use night lights.  Install grab bars by the toilet and in the tub and shower. Do not use towel bars as grab bars.  Use non-skid mats or decals in the tub or shower.  If you need to sit down in the shower, use a plastic, non-slip stool.  Keep the floor dry. Clean up any water that spills on the floor as soon as it happens.  Remove soap buildup in the tub or shower regularly.  Attach bath mats securely with double-sided non-slip rug tape.  Do not have throw rugs and other things on the floor that can make you trip. What can I do in the bedroom?  Use night lights.  Make sure that you have a light by your bed that is easy to reach.  Do not use any sheets or blankets that are too big for your bed. They should not hang down onto the  floor.  Have a firm chair that has side arms. You can use this for support while you get dressed.  Do not have throw rugs and other things on the floor that can make you trip. What can I do in the kitchen?  Clean up any spills right away.  Avoid walking on wet floors.  Keep items that you use a lot in easy-to-reach places.  If you need to reach something above you, use a strong step stool that has a grab bar.  Keep electrical cords out of the way.  Do not use floor polish or wax that makes floors slippery. If you must use wax, use non-skid floor wax.  Do not have throw rugs and other things on the floor that can make you trip. What can I do with my stairs?  Do not leave any items on the stairs.  Make sure that there are handrails on both sides of the stairs and use them. Fix handrails that are broken or loose. Make sure that handrails are as long as the stairways.  Check any carpeting to make sure that it is firmly attached to the stairs. Fix any carpet that is loose  or worn.  Avoid having throw rugs at the top or bottom of the stairs. If you do have throw rugs, attach them to the floor with carpet tape.  Make sure that you have a light switch at the top of the stairs and the bottom of the stairs. If you do not have them, ask someone to add them for you. What else can I do to help prevent falls?  Wear shoes that:  Do not have high heels.  Have rubber bottoms.  Are comfortable and fit you well.  Are closed at the toe. Do not wear sandals.  If you use a stepladder:  Make sure that it is fully opened. Do not climb a closed stepladder.  Make sure that both sides of the stepladder are locked into place.  Ask someone to hold it for you, if possible.  Clearly mark and make sure that you can see:  Any grab bars or handrails.  First and last steps.  Where the edge of each step is.  Use tools that help you move around (mobility aids) if they are needed. These include:  Canes.  Walkers.  Scooters.  Crutches.  Turn on the lights when you go into a dark area. Replace any light bulbs as soon as they burn out.  Set up your furniture so you have a clear path. Avoid moving your furniture around.  If any of your floors are uneven, fix them.  If there are any pets around you, be aware of where they are.  Review your medicines with your doctor. Some medicines can make you feel dizzy. This can increase your chance of falling. Ask your doctor what other things that you can do to help prevent falls. This information is not intended to replace advice given to you by your health care provider. Make sure you discuss any questions you have with your health care provider. Document Released: 11/04/2008 Document Revised: 06/16/2015 Document Reviewed: 02/12/2014 Elsevier Interactive Patient Education  2017 [employer].

## 2019-08-25 ENCOUNTER — Ambulatory Visit (INDEPENDENT_AMBULATORY_CARE_PROVIDER_SITE_OTHER): Payer: Medicare Other | Admitting: Family Medicine

## 2019-08-25 ENCOUNTER — Other Ambulatory Visit: Payer: Self-pay

## 2019-08-25 ENCOUNTER — Encounter: Payer: Self-pay | Admitting: Family Medicine

## 2019-08-25 VITALS — BP 120/80 | HR 73 | Temp 97.4°F | Ht 65.0 in | Wt 124.6 lb

## 2019-08-25 DIAGNOSIS — C689 Malignant neoplasm of urinary organ, unspecified: Secondary | ICD-10-CM

## 2019-08-25 DIAGNOSIS — I4821 Permanent atrial fibrillation: Secondary | ICD-10-CM | POA: Diagnosis not present

## 2019-08-25 DIAGNOSIS — I10 Essential (primary) hypertension: Secondary | ICD-10-CM

## 2019-08-25 DIAGNOSIS — J438 Other emphysema: Secondary | ICD-10-CM

## 2019-08-25 DIAGNOSIS — R0600 Dyspnea, unspecified: Secondary | ICD-10-CM | POA: Diagnosis not present

## 2019-08-25 DIAGNOSIS — I4891 Unspecified atrial fibrillation: Secondary | ICD-10-CM | POA: Diagnosis not present

## 2019-08-25 DIAGNOSIS — R0609 Other forms of dyspnea: Secondary | ICD-10-CM

## 2019-08-25 DIAGNOSIS — J449 Chronic obstructive pulmonary disease, unspecified: Secondary | ICD-10-CM | POA: Diagnosis not present

## 2019-08-25 DIAGNOSIS — R0789 Other chest pain: Secondary | ICD-10-CM | POA: Insufficient documentation

## 2019-08-25 DIAGNOSIS — I119 Hypertensive heart disease without heart failure: Secondary | ICD-10-CM | POA: Diagnosis not present

## 2019-08-25 LAB — CBC
HCT: 42.7 % (ref 36.0–46.0)
Hemoglobin: 14.2 g/dL (ref 12.0–15.0)
MCHC: 33.1 g/dL (ref 30.0–36.0)
MCV: 88.2 fl (ref 78.0–100.0)
Platelets: 149 10*3/uL — ABNORMAL LOW (ref 150.0–400.0)
RBC: 4.84 Mil/uL (ref 3.87–5.11)
RDW: 15.8 % — ABNORMAL HIGH (ref 11.5–15.5)
WBC: 6.7 10*3/uL (ref 4.0–10.5)

## 2019-08-25 LAB — BASIC METABOLIC PANEL
BUN: 23 mg/dL (ref 6–23)
CO2: 31 mEq/L (ref 19–32)
Calcium: 9.6 mg/dL (ref 8.4–10.5)
Chloride: 100 mEq/L (ref 96–112)
Creatinine, Ser: 1.32 mg/dL — ABNORMAL HIGH (ref 0.40–1.20)
GFR: 37.95 mL/min — ABNORMAL LOW (ref >60.00)
Glucose, Bld: 93 mg/dL (ref 70–99)
Potassium: 4.1 mEq/L (ref 3.5–5.1)
Sodium: 138 mEq/L (ref 135–145)

## 2019-08-25 LAB — TSH: TSH: 1.12 u[IU]/mL (ref 0.35–4.50)

## 2019-08-25 MED ORDER — FLUTICASONE-SALMETEROL 250-50 MCG/DOSE IN AEPB
1.0000 | INHALATION_SPRAY | Freq: Two times a day (BID) | RESPIRATORY_TRACT | 3 refills | Status: AC
Start: 2019-08-25 — End: ?

## 2019-08-25 NOTE — Assessment & Plan Note (Signed)
Nonexertional with no other attributable symptoms.  Suspect MSK related.  She will monitor at this time.

## 2019-08-25 NOTE — Assessment & Plan Note (Signed)
Adequate control here.  She will continue her current regimen.

## 2019-08-25 NOTE — Assessment & Plan Note (Signed)
This is likely the cause of her dyspnea on exertion.  We will start Advair.  She will follow up in 6 to 8 weeks.  We will check labs to rule out other underlying causes of her dyspnea on exertion.

## 2019-08-25 NOTE — Assessment & Plan Note (Addendum)
Stable.  She will continue her current regimen.  Discussed bleeding precautions.  Check CBC.

## 2019-08-25 NOTE — Patient Instructions (Signed)
Nice to see you. We will get lab work today. We will start you on Advair to see if that helps with your breathing. If you have sudden worsening breathing, chest pain, or any new or change in symptoms please seek medical attention.

## 2019-08-25 NOTE — Progress Notes (Signed)
[name], MD Phone: [phone]  AHNYLA MENDEL is a 86 y.o. female who presents today for f/u.  HYPERTENSION  Disease Monitoring  Home BP Monitoring checks once a week at her facility though I have no values Chest pressure- no    Dyspnea- yes, see below Medications  Compliance-  Amlodipine, lasix prn.  Edema- not since using lasix prn  A. fib: Taking Eliquis and atenolol.  Rarely has palpitations that do not last very long.  No bleeding issues.  Kidney cancer: She saw urology recently.  Denied any symptoms at that time.  She and her son have opted for monitoring of the lesion.  COPD: Patient has chronic dyspnea on exertion for greater than a year.  She notes the albuterol is beneficial.  No cough or wheezing.  Atypical chest pain: Notes occasional shooting discomfort in her sternum area.  This is not brought on by anything specific.  Not associated with exertion.  Does not occur very frequently.  No other symptoms associated with this.   Social History   Tobacco Use  Smoking Status Former Smoker  . Packs/day: 1.50  . Years: 50.00  . Pack years: 75.00  . Types: Cigarettes  . Quit date: 10/03/1995  . Years since quitting: 23.9  Smokeless Tobacco Never Used     ROS see history of present illness  Objective  Physical Exam Vitals:   08/25/19 0953  BP: 120/80  Pulse: 73  Temp: (!) 97.4 F (36.3 C)  SpO2: 96%    BP Readings from Last 3 Encounters:  08/25/19 120/80  07/28/19 (!) 156/70  07/22/19 (!) 156/84   Wt Readings from Last 3 Encounters:  08/25/19 124 lb 9.6 oz (56.5 kg)  08/05/19 123 lb (55.8 kg)  07/28/19 123 lb 6 oz (56 kg)    Physical Exam Constitutional:      General: She is not in acute distress.    Appearance: She is not diaphoretic.  Cardiovascular:     Rate and Rhythm: Normal rate and regular rhythm.     Heart sounds: Normal heart sounds.  Pulmonary:     Effort: Pulmonary effort is normal.     Breath sounds: Normal breath sounds.    Musculoskeletal:     Right lower leg: No edema.     Left lower leg: No edema.  Skin:    General: Skin is warm and dry.  Neurological:     Mental Status: She is alert.      Assessment/Plan: Please see individual problem list.  Atrial fibrillation (HCC) Stable.  She will continue her current regimen.  Discussed bleeding precautions.  Check CBC.  HYPERTENSION, BENIGN Adequate control here.  She will continue her current regimen.  COPD (chronic obstructive pulmonary disease) This is likely the cause of her dyspnea on exertion.  We will start Advair.  She will follow up in 6 to 8 weeks.  We will check labs to rule out other underlying causes of her dyspnea on exertion.  Urothelial cancer [facility]) She will continue to follow with urology and continue periodic imaging with them.  Atypical chest pain Nonexertional with no other attributable symptoms.  Suspect MSK related.  She will monitor at this time.   Orders Placed This Encounter  Procedures  . Basic Metabolic Panel (BMET)  . TSH  . CBC    Meds ordered this encounter  Medications  . Fluticasone-Salmeterol (ADVAIR DISKUS) 250-50 MCG/DOSE AEPB    Sig: Inhale 1 puff into the lungs in the morning and at  bedtime.    Dispense:  180 each    Refill:  3    This visit occurred during the SARS-CoV-2 public health emergency.  Safety protocols were in place, including screening questions prior to the visit, additional usage of staff PPE, and extensive cleaning of exam room while observing appropriate contact time as indicated for disinfecting solutions.    [name], MD [place]

## 2019-08-25 NOTE — Assessment & Plan Note (Signed)
She will continue to follow with urology and continue periodic imaging with them.

## 2019-09-06 DIAGNOSIS — E038 Other specified hypothyroidism: Secondary | ICD-10-CM | POA: Diagnosis not present

## 2019-09-06 DIAGNOSIS — I119 Hypertensive heart disease without heart failure: Secondary | ICD-10-CM | POA: Diagnosis not present

## 2019-09-06 DIAGNOSIS — E559 Vitamin D deficiency, unspecified: Secondary | ICD-10-CM | POA: Diagnosis not present

## 2019-09-06 DIAGNOSIS — E119 Type 2 diabetes mellitus without complications: Secondary | ICD-10-CM | POA: Diagnosis not present

## 2019-09-06 DIAGNOSIS — D518 Other vitamin B12 deficiency anemias: Secondary | ICD-10-CM | POA: Diagnosis not present

## 2019-09-10 DIAGNOSIS — E038 Other specified hypothyroidism: Secondary | ICD-10-CM | POA: Diagnosis not present

## 2019-09-10 DIAGNOSIS — E119 Type 2 diabetes mellitus without complications: Secondary | ICD-10-CM | POA: Diagnosis not present

## 2019-09-10 DIAGNOSIS — E559 Vitamin D deficiency, unspecified: Secondary | ICD-10-CM | POA: Diagnosis not present

## 2019-09-10 DIAGNOSIS — D518 Other vitamin B12 deficiency anemias: Secondary | ICD-10-CM | POA: Diagnosis not present

## 2019-09-10 DIAGNOSIS — Z79899 Other long term (current) drug therapy: Secondary | ICD-10-CM | POA: Diagnosis not present

## 2019-09-18 DIAGNOSIS — G4700 Insomnia, unspecified: Secondary | ICD-10-CM | POA: Diagnosis not present

## 2019-09-18 DIAGNOSIS — R413 Other amnesia: Secondary | ICD-10-CM | POA: Diagnosis not present

## 2019-09-21 DIAGNOSIS — J449 Chronic obstructive pulmonary disease, unspecified: Secondary | ICD-10-CM | POA: Diagnosis not present

## 2019-09-21 DIAGNOSIS — I4891 Unspecified atrial fibrillation: Secondary | ICD-10-CM | POA: Diagnosis not present

## 2019-09-21 DIAGNOSIS — I10 Essential (primary) hypertension: Secondary | ICD-10-CM | POA: Diagnosis not present

## 2019-09-29 ENCOUNTER — Ambulatory Visit (INDEPENDENT_AMBULATORY_CARE_PROVIDER_SITE_OTHER): Payer: Medicare Other | Admitting: *Deleted

## 2019-09-29 DIAGNOSIS — I442 Atrioventricular block, complete: Secondary | ICD-10-CM | POA: Diagnosis not present

## 2019-10-01 DIAGNOSIS — I119 Hypertensive heart disease without heart failure: Secondary | ICD-10-CM | POA: Diagnosis not present

## 2019-10-01 DIAGNOSIS — E119 Type 2 diabetes mellitus without complications: Secondary | ICD-10-CM | POA: Diagnosis not present

## 2019-10-01 DIAGNOSIS — E038 Other specified hypothyroidism: Secondary | ICD-10-CM | POA: Diagnosis not present

## 2019-10-01 DIAGNOSIS — D518 Other vitamin B12 deficiency anemias: Secondary | ICD-10-CM | POA: Diagnosis not present

## 2019-10-01 DIAGNOSIS — E559 Vitamin D deficiency, unspecified: Secondary | ICD-10-CM | POA: Diagnosis not present

## 2019-10-01 LAB — CUP PACEART REMOTE DEVICE CHECK
Battery Remaining Longevity: 117 mo
Battery Remaining Percentage: 95.5 %
Battery Voltage: 2.99 V
Brady Statistic AP VP Percent: 94 %
Brady Statistic AP VS Percent: 1 %
Brady Statistic AS VP Percent: 5.1 %
Brady Statistic AS VS Percent: 1 %
Brady Statistic RA Percent Paced: 34 %
Brady Statistic RV Percent Paced: 99 %
Date Time Interrogation Session: 20210908142250
Implantable Lead Implant Date: 20090610
Implantable Lead Implant Date: 20090610
Implantable Lead Location: 753859
Implantable Lead Location: 753860
Implantable Lead Model: 5076
Implantable Pulse Generator Implant Date: 20190204
Lead Channel Impedance Value: 360 Ohm
Lead Channel Impedance Value: 440 Ohm
Lead Channel Pacing Threshold Amplitude: 0.75 V
Lead Channel Pacing Threshold Amplitude: 0.75 V
Lead Channel Pacing Threshold Pulse Width: 0.5 ms
Lead Channel Pacing Threshold Pulse Width: 0.5 ms
Lead Channel Sensing Intrinsic Amplitude: 2 mV
Lead Channel Sensing Intrinsic Amplitude: 3.3 mV
Lead Channel Setting Pacing Amplitude: 1 V
Lead Channel Setting Pacing Amplitude: 2 V
Lead Channel Setting Pacing Pulse Width: 0.5 ms
Lead Channel Setting Sensing Sensitivity: 2 mV
Pulse Gen Model: 2272
Pulse Gen Serial Number: 8991929

## 2019-10-02 NOTE — Progress Notes (Signed)
Remote pacemaker transmission.   

## 2019-10-09 DIAGNOSIS — B351 Tinea unguium: Secondary | ICD-10-CM | POA: Diagnosis not present

## 2019-10-14 ENCOUNTER — Ambulatory Visit: Payer: Medicare Other | Admitting: Family Medicine

## 2019-10-16 DIAGNOSIS — G4700 Insomnia, unspecified: Secondary | ICD-10-CM | POA: Diagnosis not present

## 2019-10-16 DIAGNOSIS — R413 Other amnesia: Secondary | ICD-10-CM | POA: Diagnosis not present

## 2019-10-19 DIAGNOSIS — J449 Chronic obstructive pulmonary disease, unspecified: Secondary | ICD-10-CM | POA: Diagnosis not present

## 2019-10-19 DIAGNOSIS — I10 Essential (primary) hypertension: Secondary | ICD-10-CM | POA: Diagnosis not present

## 2019-10-19 DIAGNOSIS — I4891 Unspecified atrial fibrillation: Secondary | ICD-10-CM | POA: Diagnosis not present

## 2019-10-19 DIAGNOSIS — I714 Abdominal aortic aneurysm, without rupture: Secondary | ICD-10-CM | POA: Diagnosis not present

## 2019-10-26 DIAGNOSIS — D692 Other nonthrombocytopenic purpura: Secondary | ICD-10-CM | POA: Diagnosis not present

## 2019-10-27 ENCOUNTER — Telehealth: Payer: Self-pay | Admitting: Internal Medicine

## 2019-10-27 NOTE — Telephone Encounter (Signed)
The patient sees Dr. Caryl Comes for EP & Dr. Rockey Situ as primary cards.    To both physicians to review the message below.

## 2019-10-27 NOTE — Telephone Encounter (Signed)
Nurse with [facility] states patient is on Plavix 75 mg and Eliquis. Please call to discuss if patient should be taking both of these.

## 2019-10-27 NOTE — Telephone Encounter (Signed)
Renal artery stent placed 2 years ago Suspect we can hold the Plavix, stay on Eliquis given high burden of atrial fibrillation

## 2019-10-28 DIAGNOSIS — E038 Other specified hypothyroidism: Secondary | ICD-10-CM | POA: Diagnosis not present

## 2019-10-28 DIAGNOSIS — E119 Type 2 diabetes mellitus without complications: Secondary | ICD-10-CM | POA: Diagnosis not present

## 2019-10-28 DIAGNOSIS — D518 Other vitamin B12 deficiency anemias: Secondary | ICD-10-CM | POA: Diagnosis not present

## 2019-10-28 DIAGNOSIS — I119 Hypertensive heart disease without heart failure: Secondary | ICD-10-CM | POA: Diagnosis not present

## 2019-10-28 DIAGNOSIS — E559 Vitamin D deficiency, unspecified: Secondary | ICD-10-CM | POA: Diagnosis not present

## 2019-10-28 NOTE — Telephone Encounter (Signed)
I saw Dr. Rockey Situ in clinic this morning and asked if I needed to review with Dr. Ronalee Belts if the patient is ok to now stop plavix.  Per Dr. Rockey Situ- ok to message Dr. Delana Meyer to confirm if ok for the patient to stop plavix since she is 2 years out from renal stenting.  She will need to stay on eliquis for a-fib.  To Dr. Delana Meyer to review.

## 2019-10-29 NOTE — Telephone Encounter (Signed)
Dr. Rockey Situ cc'ed me on a secure chat message this morning from Dr. Delana Meyer with his comments stating:  It would be ok with me to stop the Plavix given her age and teh stent being two years old Thanks   I have called and spoken with Tammy at [facility] and advised her of the above recommendations. She states the patient was having a good bit of bruising the and physician at [facility] had asked her to reach out about the plavix & eliquis.  She is asking for a note in writing that the patient may stop plavix.   I advised I could provide her with this a little later today. Fax #: [phone] [place]: Tammy

## 2019-10-29 NOTE — Telephone Encounter (Signed)
Attempted to fax an order to stop plavix to [place]: Tammy- to [phone].  Awaiting fax confirmation at this time.

## 2019-10-30 NOTE — Telephone Encounter (Signed)
Confirmation received 2 times and was successful transmission. Placed on Heather's cart.

## 2019-11-02 NOTE — Progress Notes (Signed)
Patient Care Team: [facility] as PCP - General (Geriatric Medicine) Minna Merritts, MD as PCP - Cardiology (Cardiology) Deboraha Sprang, MD (Cardiology) Minna Merritts, MD (Cardiology)   HPI  Jessica [name] is a 86 y.o. female Seen in follow-up for a St Jude pacemaker implanted for complete heart block; she has persistent Afib.   Chronic dyspnea without significant variability that might correlate with the variable atrial fib/fluttter Some edema No PND  Has Covid vaccine   On Anticoagulation;  No bleeding issues      Device History: Pacemaker  implanted 1980  for CHB Changed out 2009. 2019   Date Lead Status   1980 CPI 0505 Abandoned   2009 A 5076   2009 V 1688     We have in the past discussed NOACs. She has elected to stay on warfarin .    Thromboembolic risk factors ( age  -2, HTN-1, TIA/CVA-2, Vasc disease -1, Gender-1) for a CHADSVASc Score of 7    DATE TEST    7/18 Myoview   EF 65 % No ischemia  8//18 Echo    EF 65 % Mod LVH   Date Cr K Hgb  7/17 1.36 4.0   5/18   14.4  3/19  1.58 3.3-->5.0 14.3-->11.9  1/20 1.14 3.6 11.7  12.20 1.43 3.7 13.3  8/21 1.32 4.1 14.2        Past Medical History:  Diagnosis Date  . AAA (abdominal aortic aneurysm) ([place])   . Atrioventricular block, complete ([place])   . Cardiac pacemaker in situ   . COPD (chronic obstructive pulmonary disease) ([place])   . Gout   . Hernia   . Hyperlipidemia   . Hypertension   . Kidney stone   . Presence of permanent cardiac pacemaker   . Rectal bleeding     Past Surgical History:  Procedure Laterality Date  . ABDOMINAL AORTIC ANEURYSM REPAIR     son denies  . CARDIOVERSION N/A 02/06/2019   Procedure: CARDIOVERSION;  Surgeon: Minna Merritts, MD;  Location: [facility] ORS;  Service: Cardiovascular;  Laterality: N/A;  . CYSTOSCOPY/URETEROSCOPY/HOLMIUM LASER/STENT PLACEMENT Right 01/01/2019   Procedure: CYSTOSCOPY/URETEROSCOPY/HOLMIUM LASER/STENT PLACEMENT;   Surgeon: Billey Co, MD;  Location: [facility] ORS;  Service: Urology;  Laterality: Right;  . HERNIA REPAIR    . ohter     growth on colon surgery  . PPM GENERATOR CHANGEOUT N/A 02/25/2017   Procedure: PPM GENERATOR CHANGEOUT;  Surgeon: Deboraha Sprang, MD;  Location: [facility];  Service: Cardiovascular;  Laterality: N/A;  . RENAL ANGIOGRAPHY Right 12/03/2017   Procedure: RENAL ANGIOGRAPHY;  Surgeon: Katha Cabal, MD;  Location: [facility];  Service: Cardiovascular;  Laterality: Right;  . TONSILLECTOMY      Current Outpatient Medications  Medication Sig Dispense Refill  . acetaminophen (TYLENOL) 500 MG tablet Take 500 mg by mouth every 6 (six) hours as needed for moderate pain or headache.    . albuterol (PROAIR HFA) 108 (90 Base) MCG/ACT inhaler Inhale 2 puffs into the lungs every 6 (six) hours as needed for wheezing or shortness of breath. 1 Inhaler 5  . amLODipine (NORVASC) 5 MG tablet Take 1 tablet (5 mg total) by mouth daily. Please call to schedule office visit for further refills. 90 tablet 0  . apixaban (ELIQUIS) 2.5 MG TABS tablet Take 1 tablet (2.5 mg total) by mouth 2 (two) times daily. 180 tablet 1  . atenolol (TENORMIN) 25  MG tablet Take 1 tablet (25 mg total) by mouth daily. Please call to schedule office visit for further refills. 90 tablet 0  . cholecalciferol 25 MCG (1000 UT) TABS Take 1,000 Units by mouth daily.    . Fluticasone-Salmeterol (ADVAIR DISKUS) 250-50 MCG/DOSE AEPB Inhale 1 puff into the lungs in the morning and at bedtime. 180 each 3  . furosemide (LASIX) 20 MG tablet Take 1 tablet (20 mg total) by mouth as needed (for shortness of breath/edema). 30 tablet 6  . melatonin 3 MG TABS tablet Take 1 tablet (3 mg) by mouth once daily at bedtime as needed for sleep    . rosuvastatin (CRESTOR) 5 MG tablet TAKE 1 TABLET BY MOUTH DAILY USUALLY IN THE EVENING 90 tablet 3  . senna-docusate (SENOKOT-S) 8.6-50 MG tablet Take 2 tablets by mouth once daily  at bedtime as needed for constipation    . tamsulosin (FLOMAX) 0.4 MG CAPS capsule Take 1 capsule (0.4 mg total) by mouth daily. 30 capsule 0  . vitamin B-12 (CYANOCOBALAMIN) 1000 MCG tablet Take 1 tablet (1000 mg) by mouth once daily at bedtime     No current facility-administered medications for this visit.    Allergies  Allergen Reactions  . Codeine Other (See Comments)    Made her feel crazy  . Morphine Other (See Comments)    Made her feel crazy  . Zyrtec [Cetirizine] Other (See Comments)    Causes facial numbness    Review of Systems negative except from HPI and PMH  Physical Exam BP 122/74 (BP Location: Left Arm, Patient Position: Sitting, Cuff Size: Normal)   Pulse 70   Ht 5\' 5"  (1.651 m)   Wt 128 lb (58.1 kg)   SpO2 95%   BMI 21.30 kg/m  Well developed and nourished in no acute distress HENT normal Neck supple with JVP-flat Clear Device pocket well healed; without hematoma or erythema.  There is no tethering  Regular rate and rhythm, no murmurs or gallops Abd-soft with active BS No Clubbing cyanosis edema Skin-warm and dry A & Oriented  Grossly normal sensory and motor function   ECG personally reviewed  Atrial flutter with Vpacing @ 70   Assessment and  Plan  Atrial fibrillation-persistent  Hypertension    AV block-complete -intermittent  Peripheral vascular disease status post AAA repair  Pacemaker-St. Jude     BP will controlled   No current complaints of Orthostasis  Dyspnea with some volume overload will have her take her diuretic for 3 days 40mg   Not clear that the atrial arrhythmia is contributing to her situation-- discussed the lack of utility in restoring sinus without adjunctive AA Rx and in the absence of clear symptoms in this older woman have elected to forgo and will pursue course of PERMANENT ATRIAL FIB/FLUTTER  Has been vaccinated, son not discussed this at some length and the importance vis a vis his mom

## 2019-11-03 ENCOUNTER — Encounter: Payer: Self-pay | Admitting: Internal Medicine

## 2019-11-03 ENCOUNTER — Other Ambulatory Visit: Payer: Self-pay

## 2019-11-03 ENCOUNTER — Ambulatory Visit (INDEPENDENT_AMBULATORY_CARE_PROVIDER_SITE_OTHER): Payer: Medicare Other | Admitting: Internal Medicine

## 2019-11-03 VITALS — BP 122/74 | HR 70 | Ht 65.0 in | Wt 128.0 lb

## 2019-11-03 DIAGNOSIS — I10 Essential (primary) hypertension: Secondary | ICD-10-CM | POA: Diagnosis not present

## 2019-11-03 DIAGNOSIS — I442 Atrioventricular block, complete: Secondary | ICD-10-CM

## 2019-11-03 DIAGNOSIS — I951 Orthostatic hypotension: Secondary | ICD-10-CM

## 2019-11-03 DIAGNOSIS — I4819 Other persistent atrial fibrillation: Secondary | ICD-10-CM | POA: Diagnosis not present

## 2019-11-03 DIAGNOSIS — Z950 Presence of cardiac pacemaker: Secondary | ICD-10-CM | POA: Diagnosis not present

## 2019-11-03 NOTE — Patient Instructions (Signed)
Medication Instructions:  - Your physician has recommended you make the following change in your medication:   1) INCREASE lasix (furosemide) 20 mg - take 2 tablets (40 mg) once daily in the morning for 3 days, then - resume normal dosing  *If you need a refill on your cardiac medications before your next appointment, please call your pharmacy*   Lab Work: - none ordered  If you have labs (blood work) drawn today and your tests are completely normal, you will receive your results only by: Marland Kitchen MyChart Message (if you have MyChart) OR . A paper copy in the mail If you have any lab test that is abnormal or we need to change your treatment, we will call you to review the results.   Testing/Procedures: - none ordered   Follow-Up: At [facility], you and your health needs are our priority.  As part of our continuing mission to provide you with exceptional heart care, we have created designated Provider Care Teams.  These Care Teams include your primary Cardiologist (physician) and Advanced Practice Providers (APPs -  Physician Assistants and Nurse Practitioners) who all work together to provide you with the care you need, when you need it.  We recommend signing up for the patient portal called "MyChart".  Sign up information is provided on this After Visit Summary.  MyChart is used to connect with patients for Virtual Visits (Telemedicine).  Patients are able to view lab/test results, encounter notes, upcoming appointments, etc.  Non-urgent messages can be sent to your provider as well.   To learn more about what you can do with MyChart, go to [email or web address].    Your next appointment:   1 year(s)  The format for your next appointment:   In Person  Provider:   Virl Axe, MD   Other Instructions n/a

## 2019-11-18 DIAGNOSIS — R590 Localized enlarged lymph nodes: Secondary | ICD-10-CM | POA: Diagnosis not present

## 2019-11-18 DIAGNOSIS — R229 Localized swelling, mass and lump, unspecified: Secondary | ICD-10-CM | POA: Diagnosis not present

## 2019-11-18 DIAGNOSIS — I6523 Occlusion and stenosis of bilateral carotid arteries: Secondary | ICD-10-CM | POA: Diagnosis not present

## 2019-11-18 DIAGNOSIS — D492 Neoplasm of unspecified behavior of bone, soft tissue, and skin: Secondary | ICD-10-CM | POA: Diagnosis not present

## 2019-11-18 DIAGNOSIS — R0989 Other specified symptoms and signs involving the circulatory and respiratory systems: Secondary | ICD-10-CM | POA: Diagnosis not present

## 2019-11-20 DIAGNOSIS — I714 Abdominal aortic aneurysm, without rupture: Secondary | ICD-10-CM | POA: Diagnosis not present

## 2019-11-20 DIAGNOSIS — I700 Atherosclerosis of aorta: Secondary | ICD-10-CM | POA: Diagnosis not present

## 2019-11-20 DIAGNOSIS — E042 Nontoxic multinodular goiter: Secondary | ICD-10-CM | POA: Diagnosis not present

## 2019-11-20 DIAGNOSIS — R221 Localized swelling, mass and lump, neck: Secondary | ICD-10-CM | POA: Diagnosis not present

## 2019-11-23 DIAGNOSIS — I739 Peripheral vascular disease, unspecified: Secondary | ICD-10-CM | POA: Diagnosis not present

## 2019-11-23 DIAGNOSIS — I70229 Atherosclerosis of native arteries of extremities with rest pain, unspecified extremity: Secondary | ICD-10-CM | POA: Diagnosis not present

## 2019-11-26 DIAGNOSIS — I119 Hypertensive heart disease without heart failure: Secondary | ICD-10-CM | POA: Diagnosis not present

## 2019-11-26 DIAGNOSIS — K5900 Constipation, unspecified: Secondary | ICD-10-CM | POA: Diagnosis not present

## 2019-11-26 DIAGNOSIS — J309 Allergic rhinitis, unspecified: Secondary | ICD-10-CM | POA: Diagnosis not present

## 2019-11-26 DIAGNOSIS — I4891 Unspecified atrial fibrillation: Secondary | ICD-10-CM | POA: Diagnosis not present

## 2019-11-28 IMAGING — CR DG HIP (WITH OR WITHOUT PELVIS) 2-3V*L*
3 series · 3 of 3 positions shown · non-contrast
Comparison: Pelvic x-ray from same day.

CLINICAL DATA: Left hip pain after fall.

EXAM:
DG HIP (WITH OR WITHOUT PELVIS) 2-3V LEFT

[dg hip unilat w or w/o pelvis 2-3 views ]
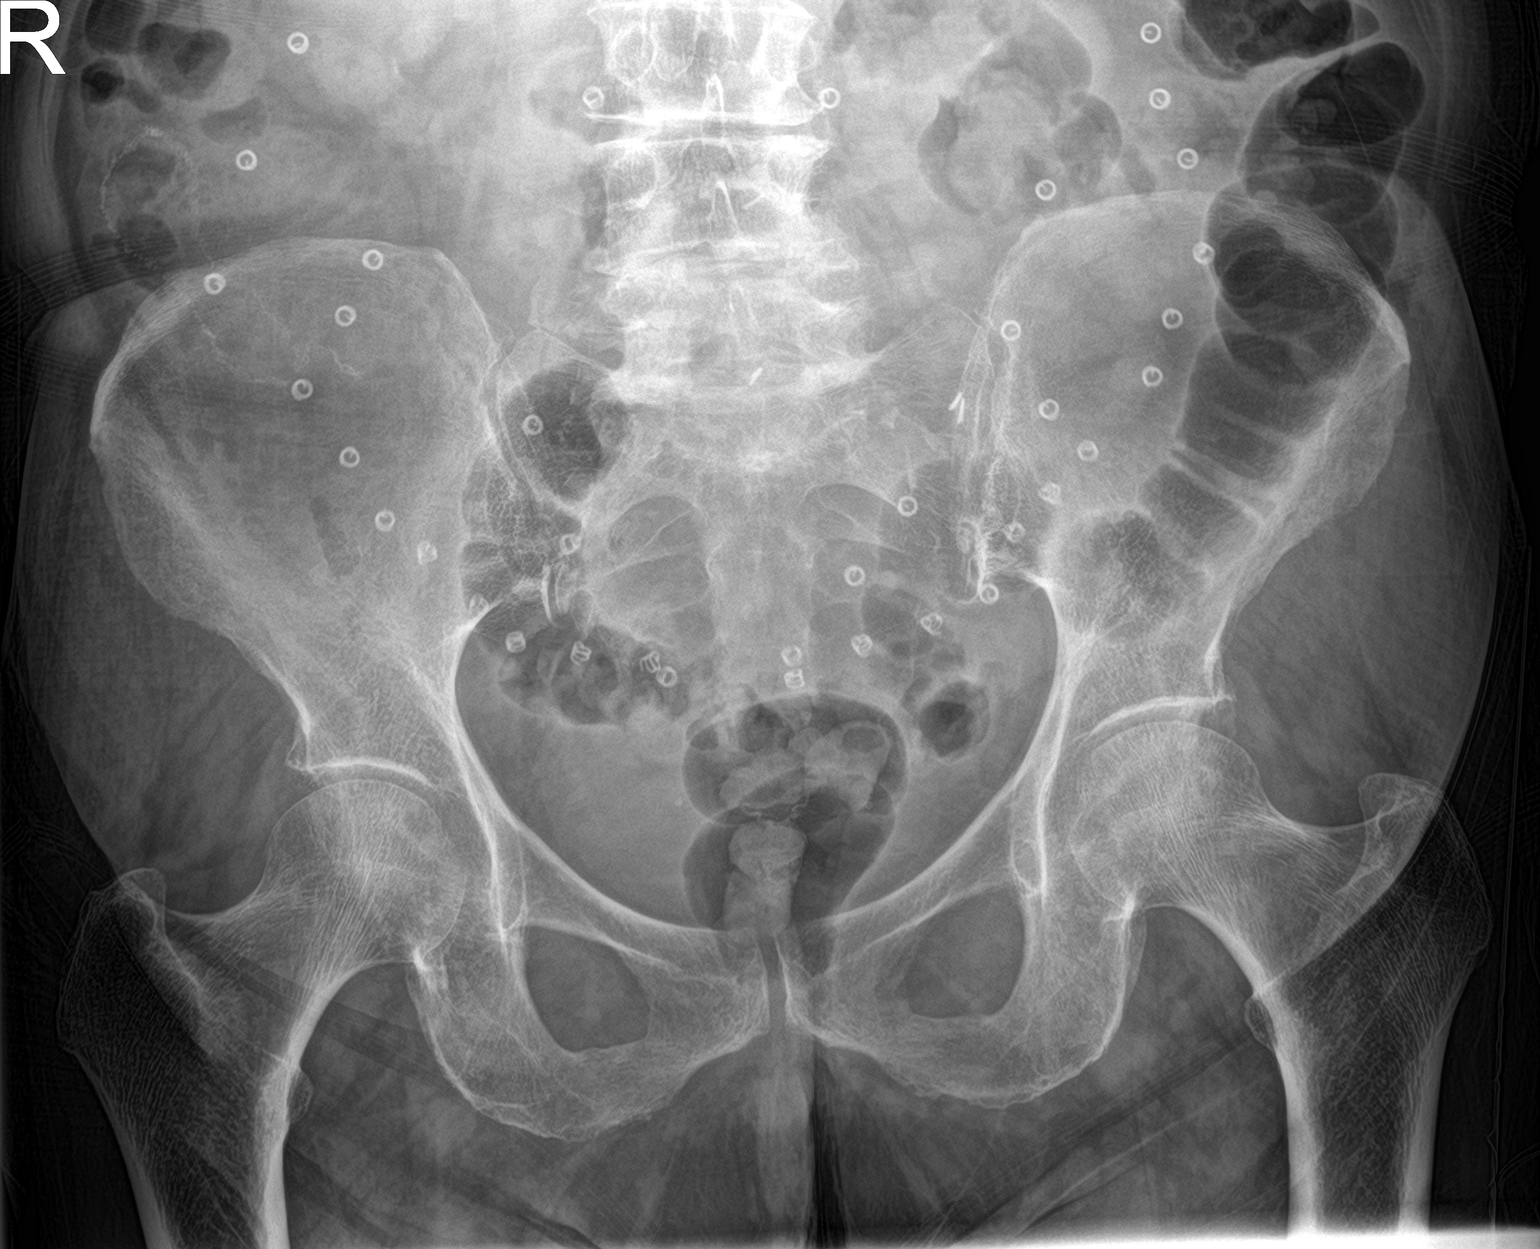

[hip ap]
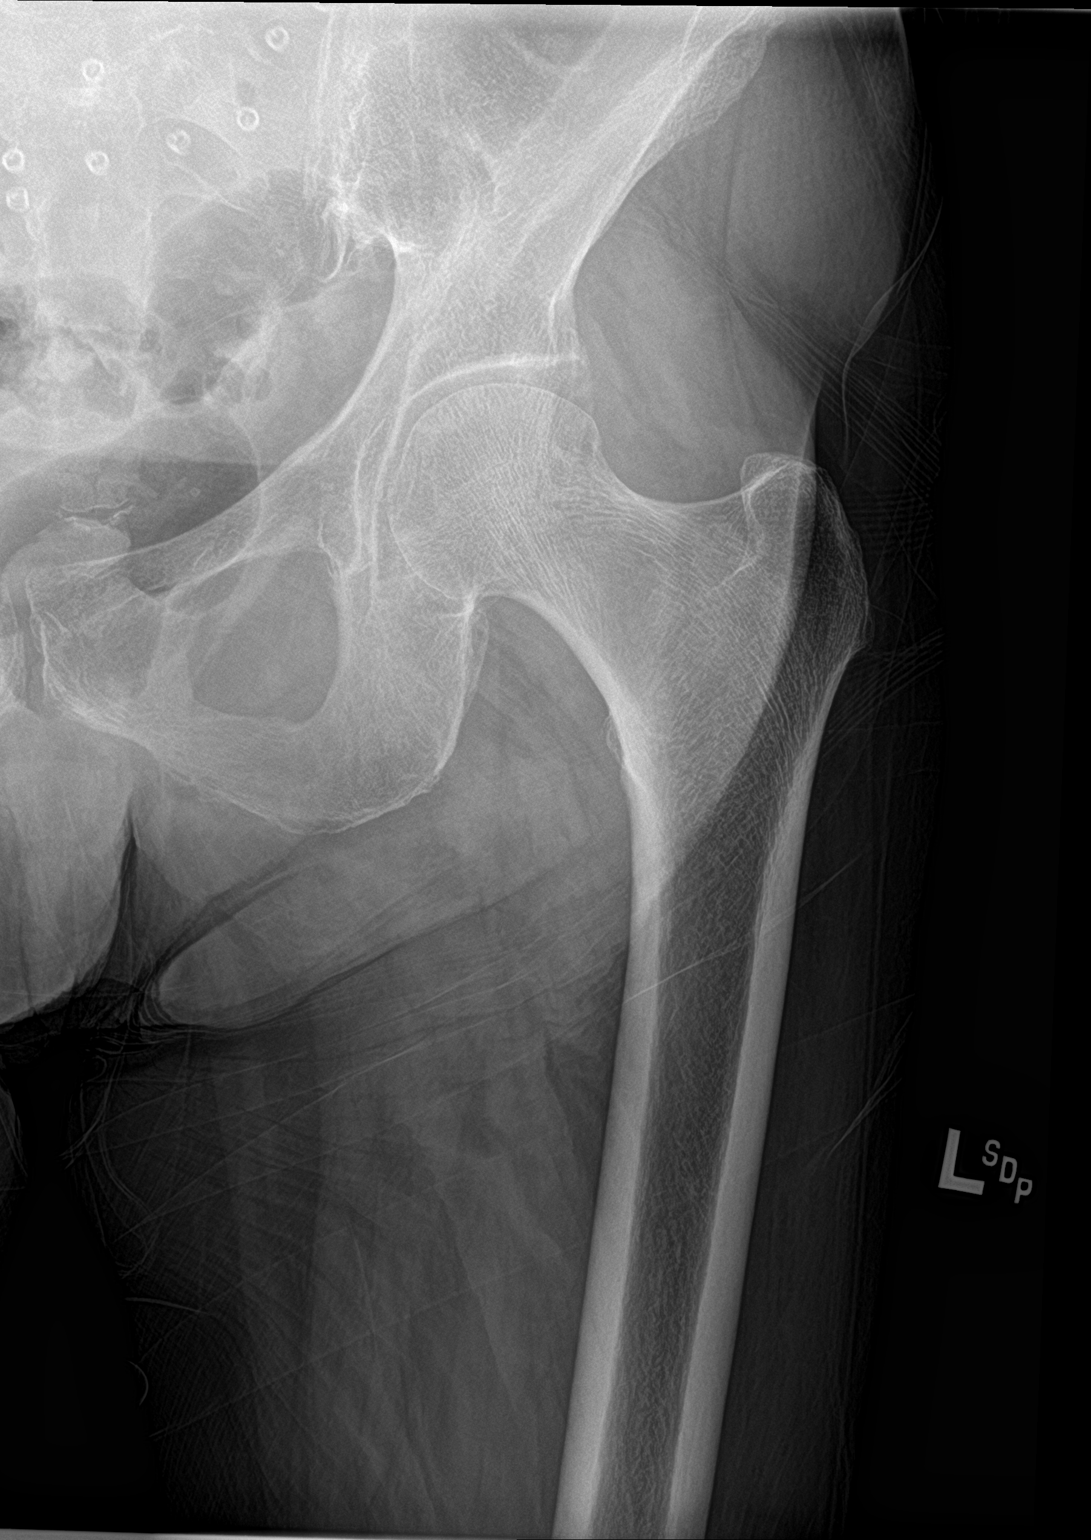

[hip lat]
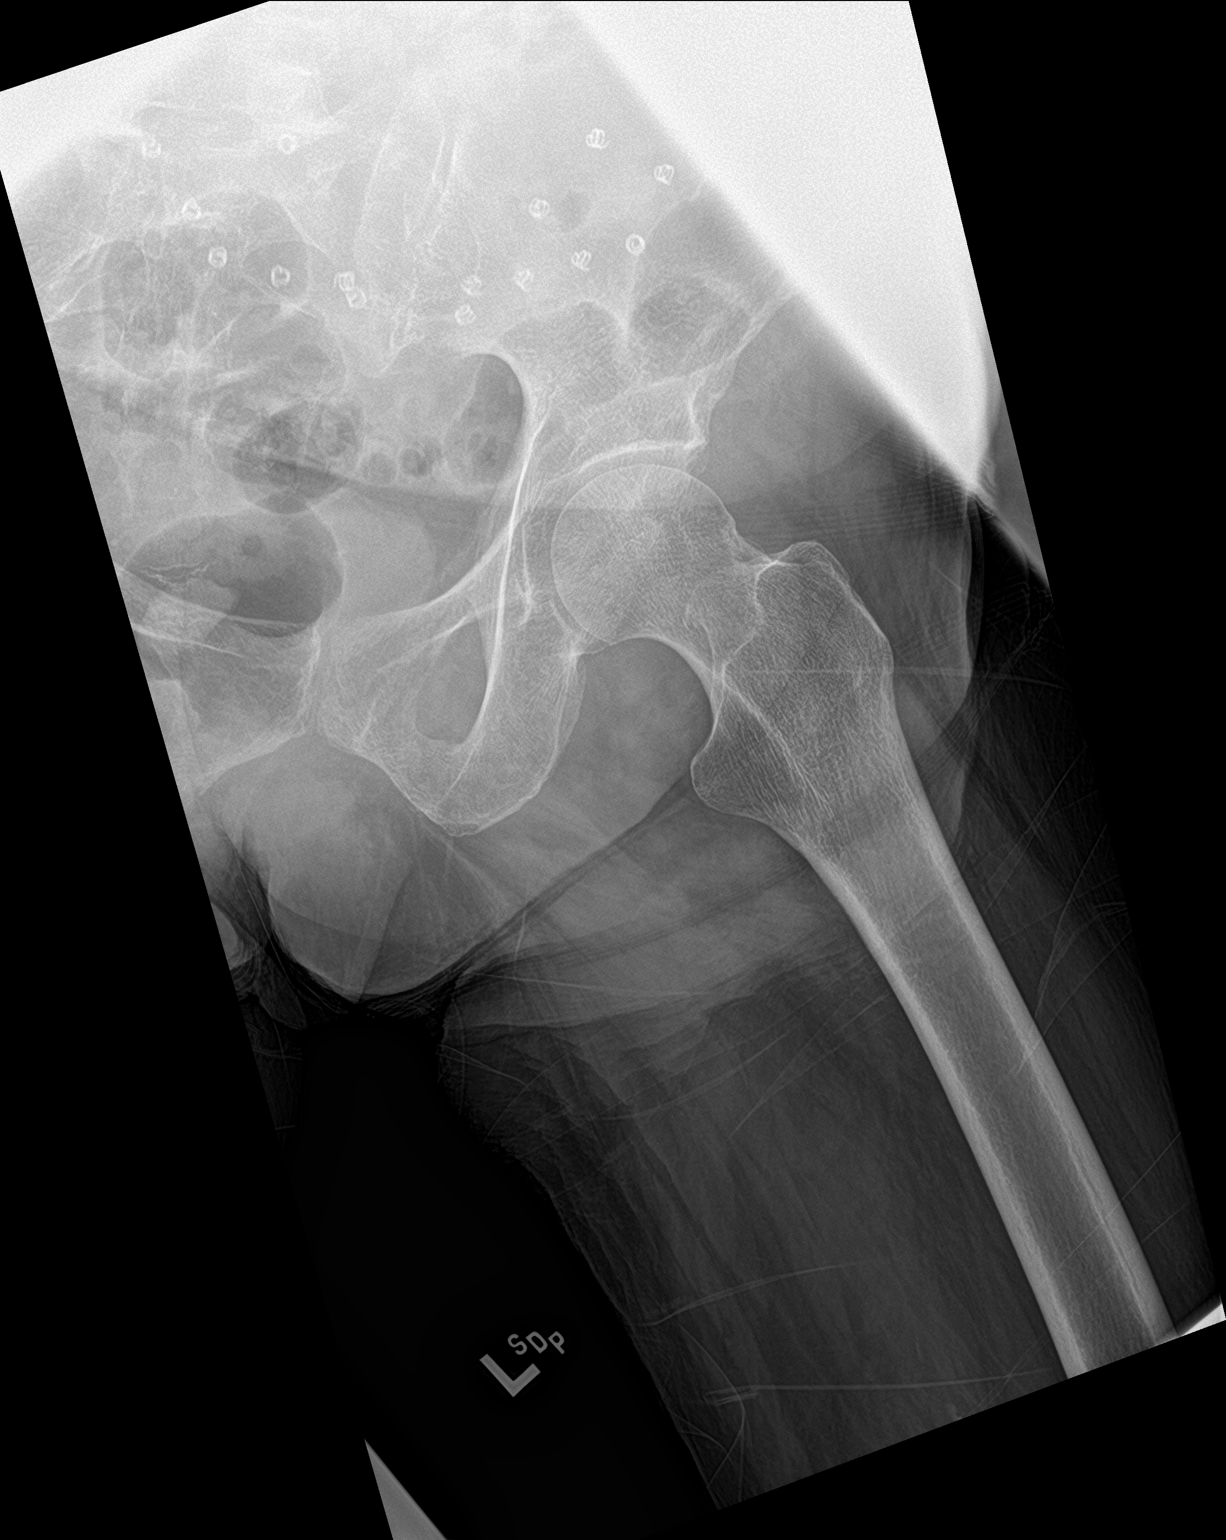

[3 of 3 positions shown; findings below may reference images not displayed]

FINDINGS: There is no evidence of hip fracture or dislocation. There is no
evidence of arthropathy or other focal bone abnormality. Osteopenia.
Soft tissues are unremarkable.
IMPRESSION: No acute osseous abnormality. If occult hip fracture is suspected or
if the patient is unable to bear weight, MRI is the preferred
modality for further evaluation.

## 2019-11-28 IMAGING — CR DG ELBOW COMPLETE 3+V*L*
1 series · 4 of 4 positions shown · non-contrast
Comparison: None.

CLINICAL DATA: Patient tripped over cement curb injuring left
elbow.

EXAM:
LEFT ELBOW - COMPLETE 3+ VIEW

[Series 1: dg elbow complete left (3+view) · 0.14mm/px · 4 of 4 slices shown]
[im 1/4]
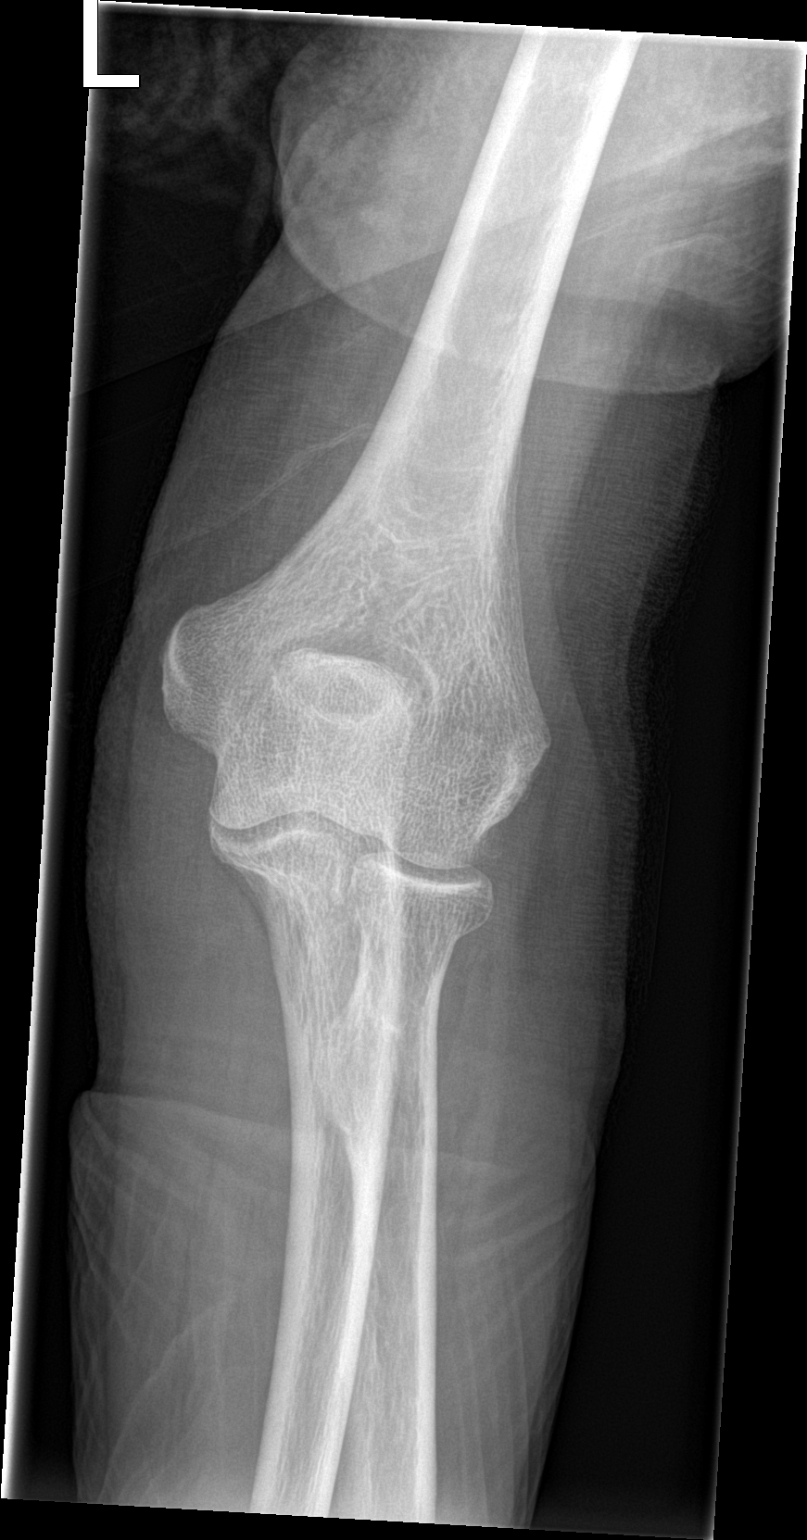
[im 2/4]
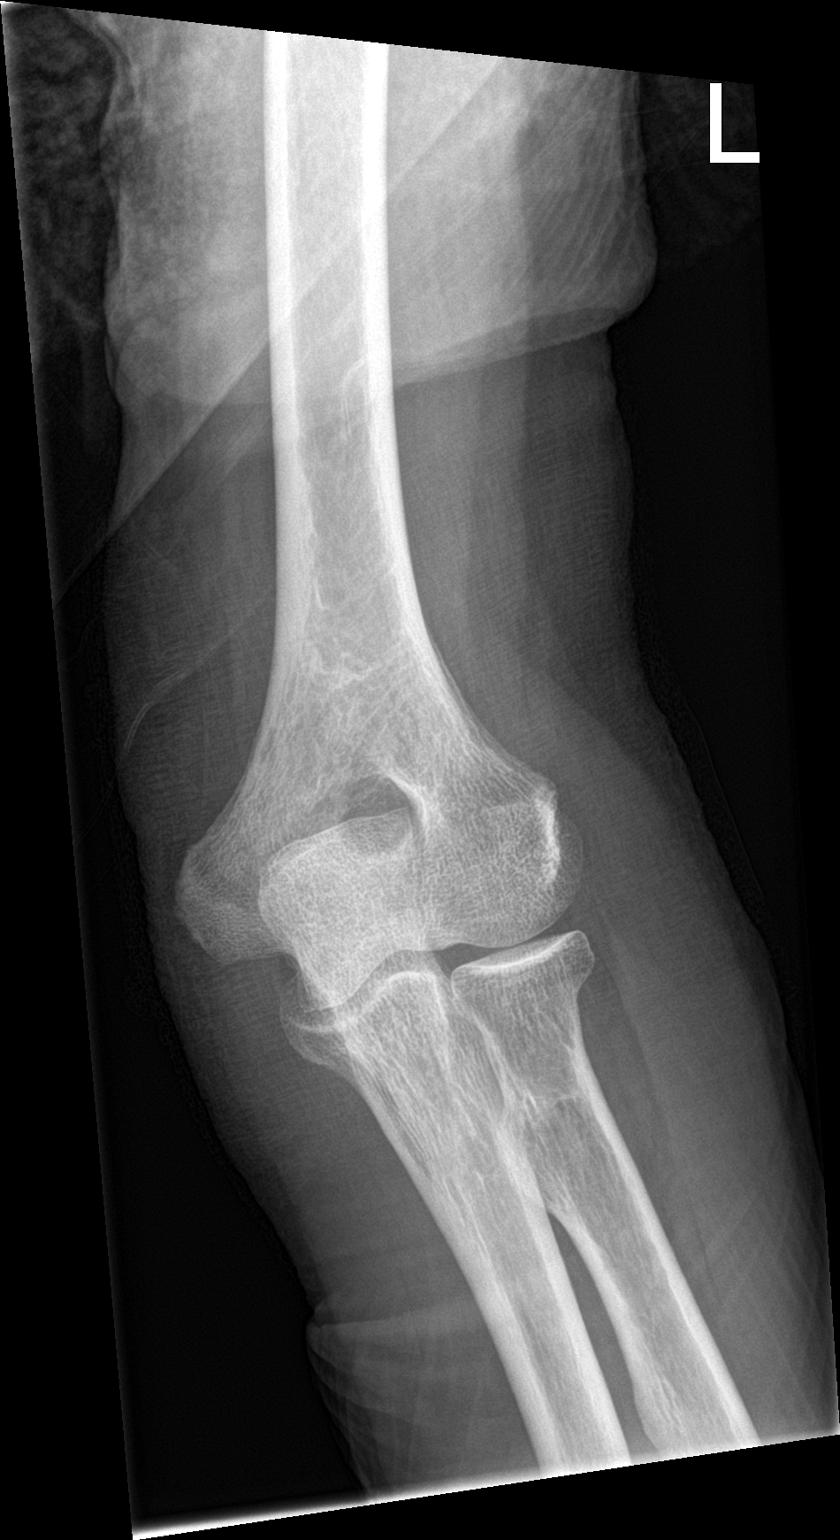
[im 3/4]
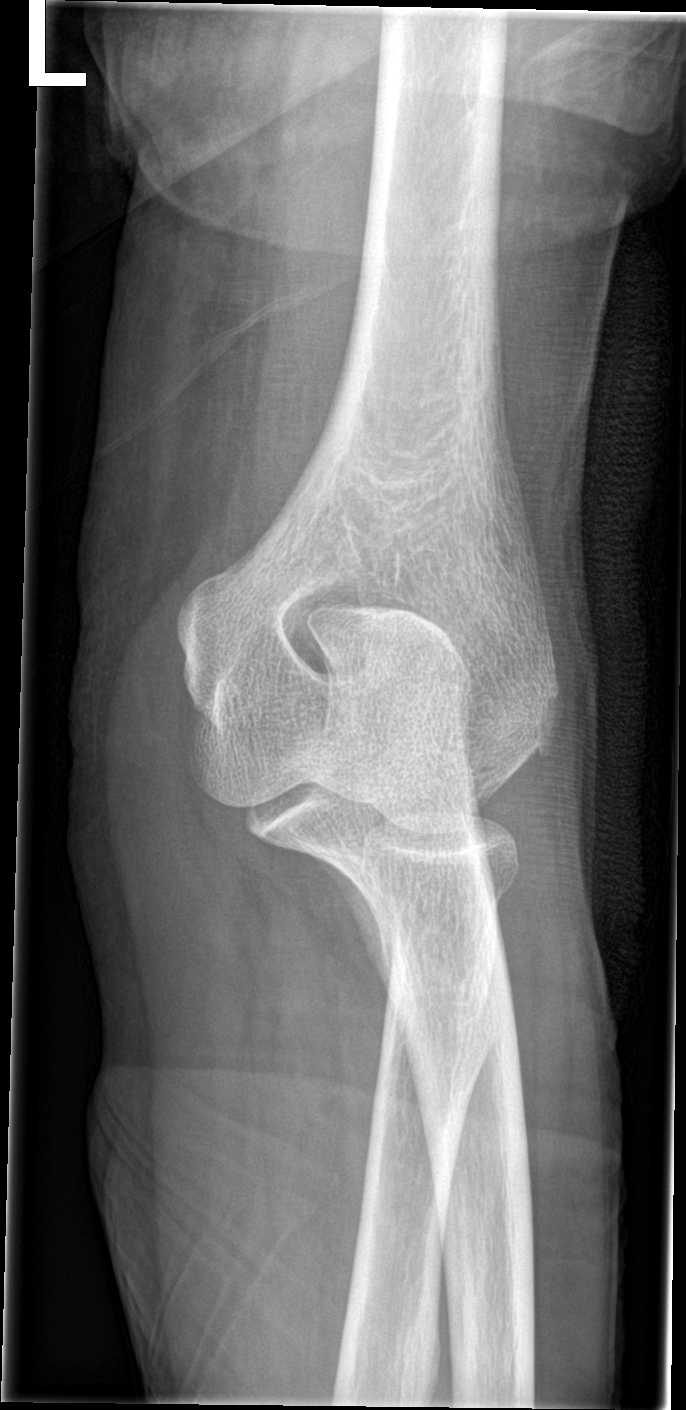
[im 4/4]
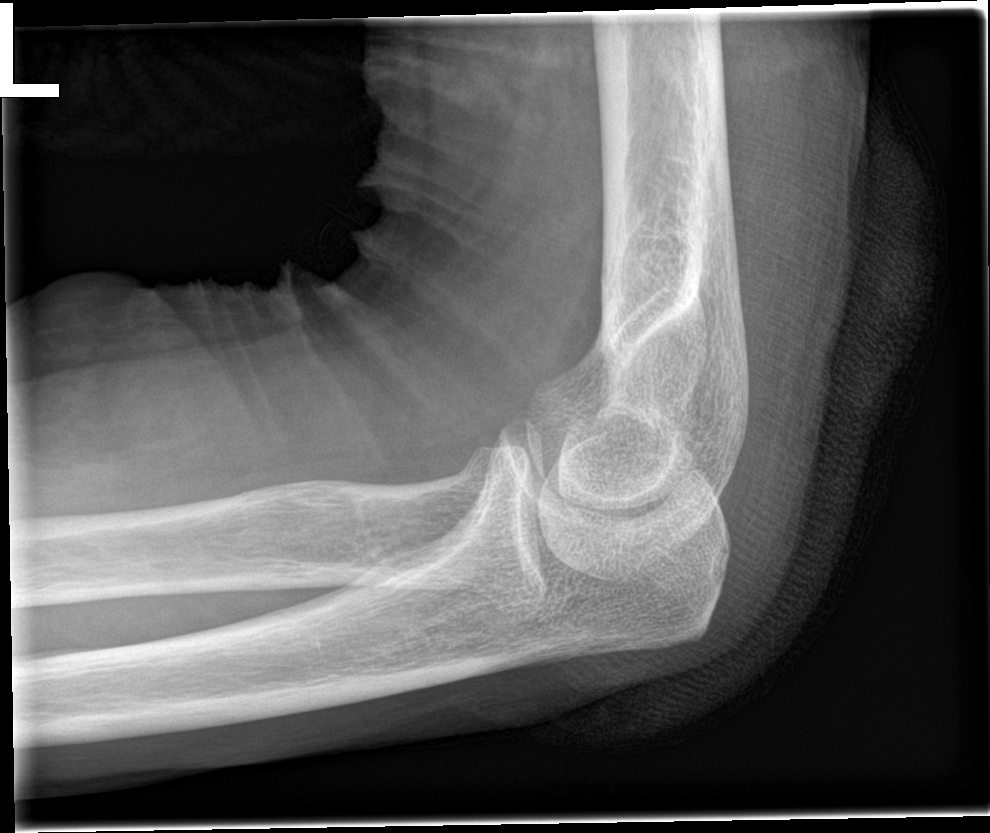

[4 of 4 positions shown; findings below may reference images not displayed]

FINDINGS: There is no evidence of fracture, dislocation, or joint effusion.
Minimal enthesopathy along the common extensor tendon origin. There
is no evidence of arthropathy or other focal bone abnormality.
Assessment of the soft tissues is slightly limited by overlying soft
tissue wrap.
IMPRESSION: Negative for acute fracture or joint effusion. No joint
dislocations. Minimal spurring at the common extensor tendon origin.

## 2019-11-28 IMAGING — CT CT PELVIS W/O CM
2 of 4 series · 15 of 46 positions shown, 18 images · non-contrast
Comparison: Same day pelvic radiographs

CLINICAL DATA: Pain after tripping over a curb today.  Pelvic pain.

EXAM:
CT PELVIS WITHOUT CONTRAST
TECHNIQUE: Multidetector CT imaging of the pelvis was performed following the
standard protocol without intravenous contrast.

[Series 3: axial st · axial · 0.74mm/px · z∈[-353,-99]mm · 12 of 142 slices shown, 15 images]
[im 10/142  soft-tissue]
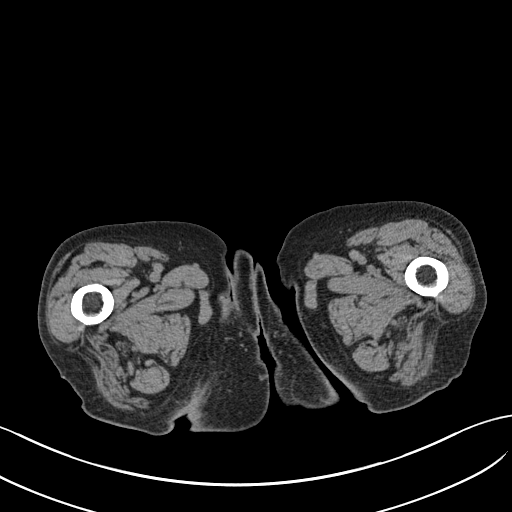
[im 10/142  bone]
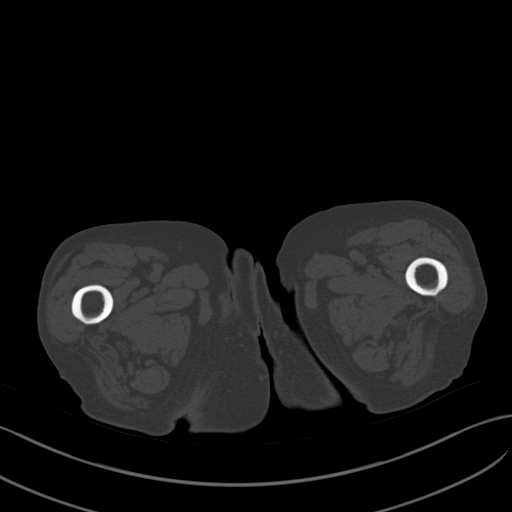
[im 28/142  soft-tissue]
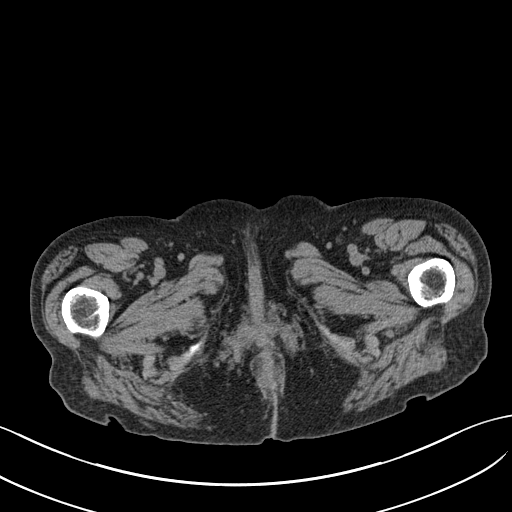
[im 41/142  soft-tissue]
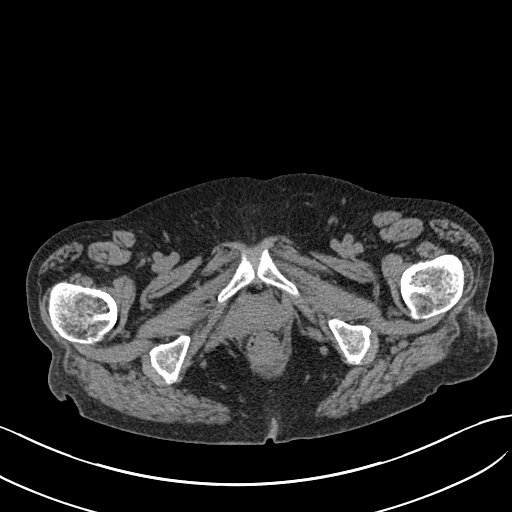
[im 55/142  soft-tissue]
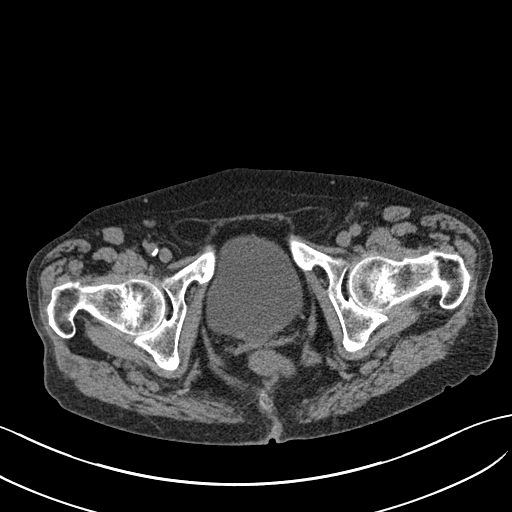
[im 73/142  soft-tissue]
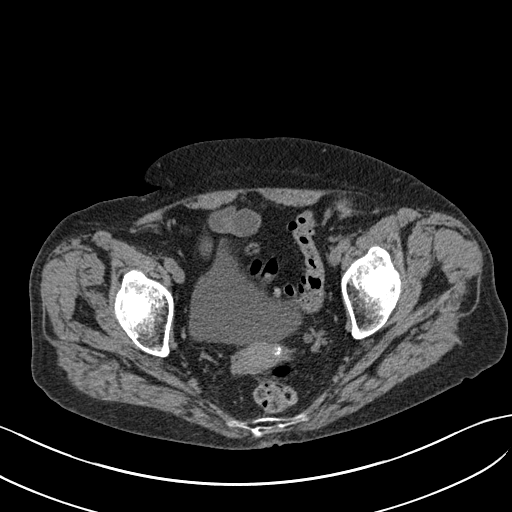
[im 87/142  soft-tissue]
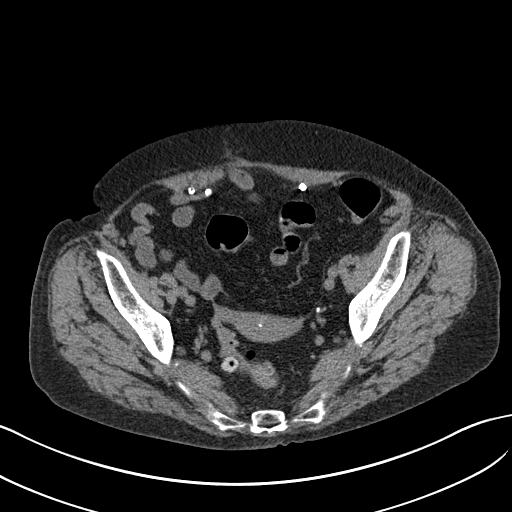
[im 101/142  soft-tissue]
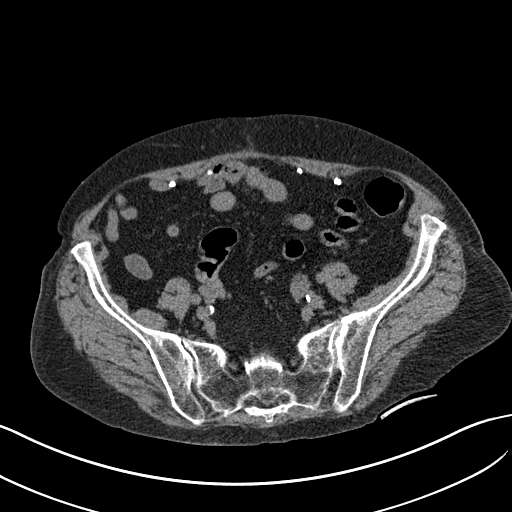
[im 119/142  soft-tissue]
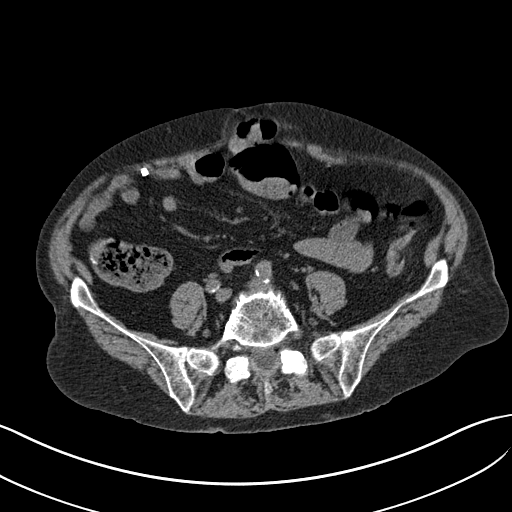
[im 123/142  lung]
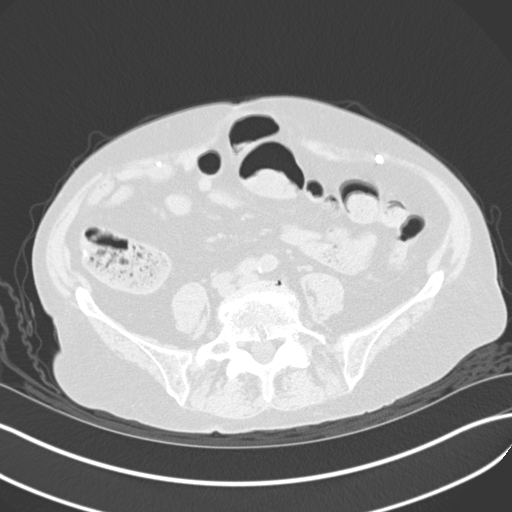
[im 128/142  lung]
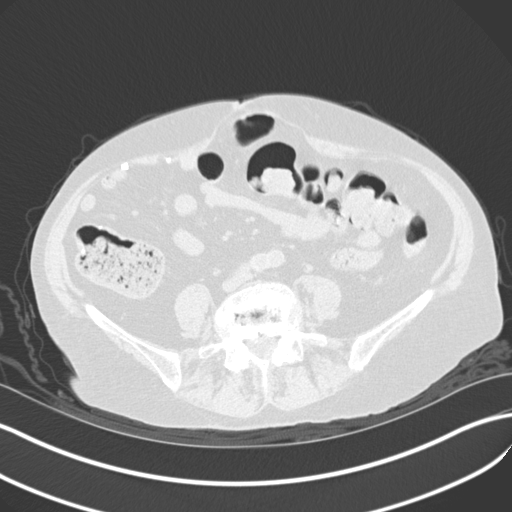
[im 132/142  soft-tissue]
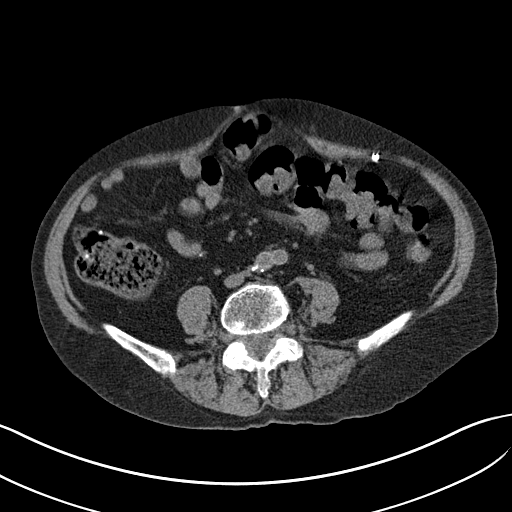
[im 132/142  lung]
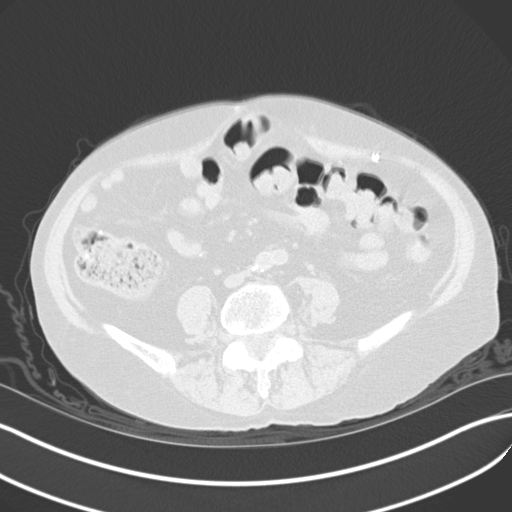
[im 132/142  bone]
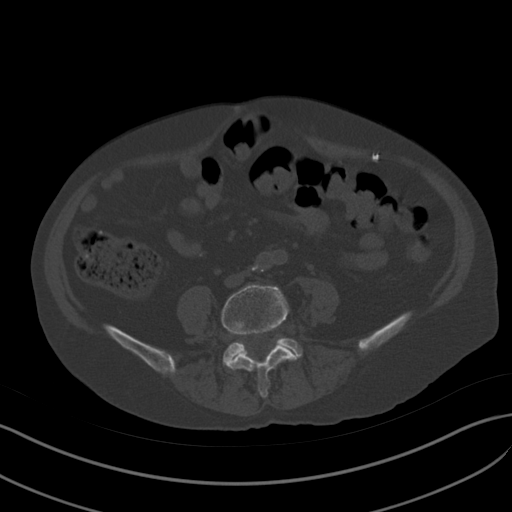
[im 137/142  lung]
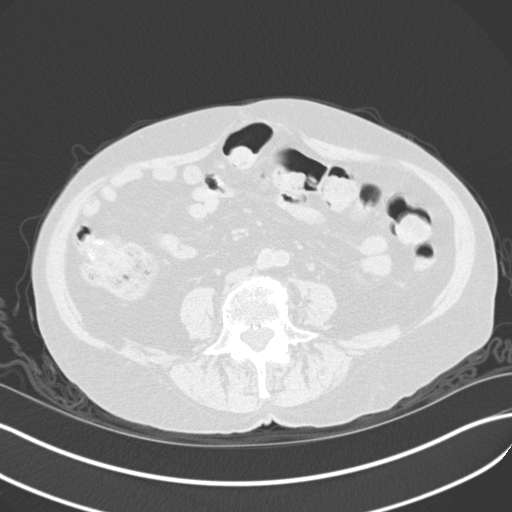

[Series 8: coronal st · coronal · 0.61mm/px · 3 of 133 slices shown]
[im 45/133  soft-tissue]
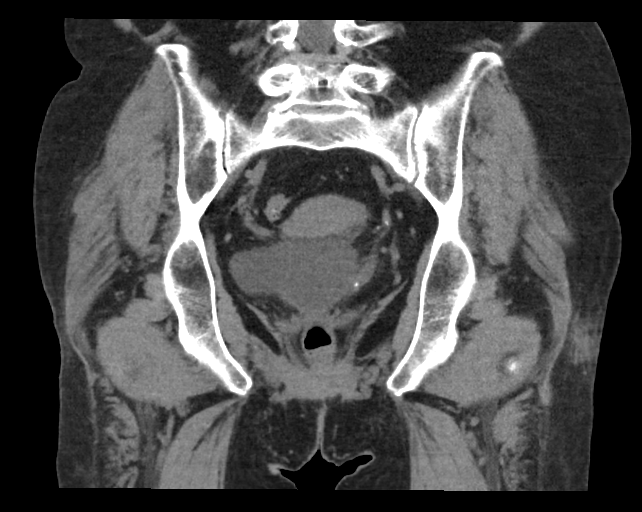
[im 59/133  soft-tissue]
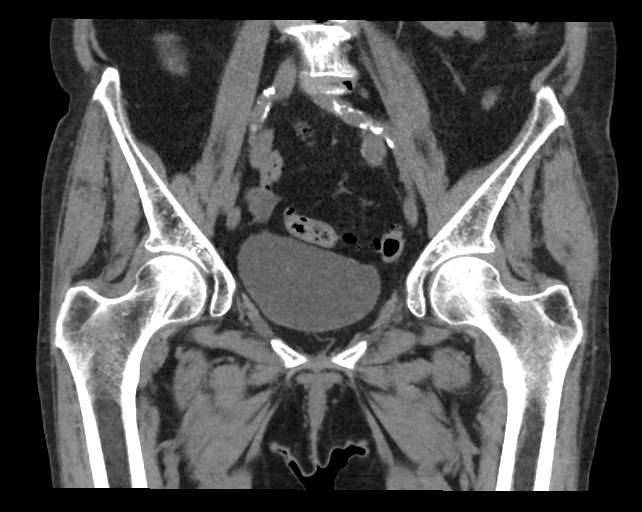
[im 74/133  soft-tissue]
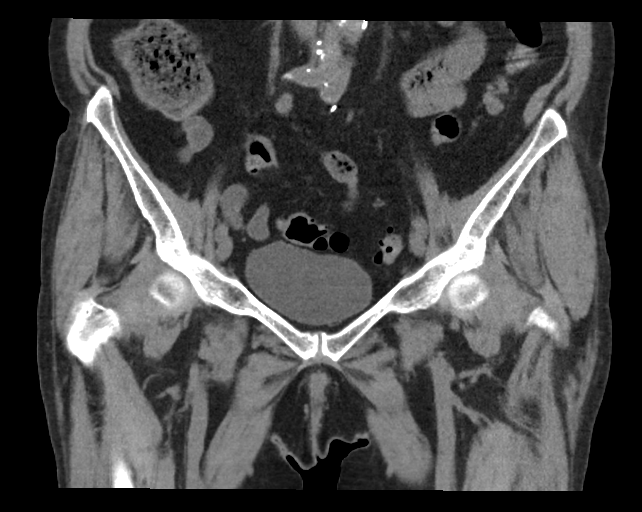

[15 of 46 positions shown; findings below may reference images not displayed]

FINDINGS: Urinary Tract: No hydroureter nor ureteral calculi. Unremarkable
bladder.

Bowel: Sigmoid diverticulosis without acute diverticulitis. No bowel
obstruction or inflammation. Large bowel protrusion at the level of
the umbilicus secondary to diastatic rectus muscles.

Vascular/Lymphatic: Aortoiliac atherosclerosis without aneurysm.

Reproductive: Uterus and adnexa are unremarkable for age. Small
bilateral fat containing inguinal hernias.

Other: Surgical tacks from prior ventral hernia repair. Diastasis of
the rectus muscles with interposed nonobstructed large bowel
protruding between the diastatic muscles.

Musculoskeletal: Lower lumbar degenerative disc disease with vacuum
disc phenomenon at L3 through S1 with associated facet arthropathy.
No acute fracture of the included lower lumbar spine, sacrum or
coccyx. Sacroiliac joints are maintained without diastasis. No
fracture of the sacral ala. The iliac bones are maintained
bilaterally. Pubic symphysis appears intact. Pubic rami are
maintained. Hip joints are intact. No proximal femoral fracture. No
joint effusion. No significant soft tissue swelling or hematoma.

Slightly buckled appearance of the anterior wall of the acetabulum
bilaterally likely developmental given symmetry and lack of
overlying soft tissue swelling.
IMPRESSION: 1. No acute displaced appearing fracture of the bony pelvis and
either hip.
2. No soft tissue hematoma or significant soft tissue swelling.
3. Lower lumbar degenerative disc disease L3 through S1.
4. Sigmoid diverticulosis without acute diverticulitis.
5. Bilateral fat containing inguinal hernias.
6. Status post ventral hernia repair with rectus diastasis and
slight protrusion of large bowel between the diastatic rectus
muscles at the level of the umbilicus. No bowel obstruction however.

## 2019-12-02 DIAGNOSIS — D518 Other vitamin B12 deficiency anemias: Secondary | ICD-10-CM | POA: Diagnosis not present

## 2019-12-02 DIAGNOSIS — E119 Type 2 diabetes mellitus without complications: Secondary | ICD-10-CM | POA: Diagnosis not present

## 2019-12-02 DIAGNOSIS — E559 Vitamin D deficiency, unspecified: Secondary | ICD-10-CM | POA: Diagnosis not present

## 2019-12-02 DIAGNOSIS — E038 Other specified hypothyroidism: Secondary | ICD-10-CM | POA: Diagnosis not present

## 2019-12-02 DIAGNOSIS — I119 Hypertensive heart disease without heart failure: Secondary | ICD-10-CM | POA: Diagnosis not present

## 2019-12-15 DIAGNOSIS — E559 Vitamin D deficiency, unspecified: Secondary | ICD-10-CM | POA: Diagnosis not present

## 2019-12-15 DIAGNOSIS — D518 Other vitamin B12 deficiency anemias: Secondary | ICD-10-CM | POA: Diagnosis not present

## 2019-12-15 DIAGNOSIS — E038 Other specified hypothyroidism: Secondary | ICD-10-CM | POA: Diagnosis not present

## 2019-12-15 DIAGNOSIS — E119 Type 2 diabetes mellitus without complications: Secondary | ICD-10-CM | POA: Diagnosis not present

## 2019-12-15 DIAGNOSIS — Z79899 Other long term (current) drug therapy: Secondary | ICD-10-CM | POA: Diagnosis not present

## 2019-12-23 DIAGNOSIS — U071 COVID-19: Secondary | ICD-10-CM | POA: Diagnosis not present

## 2019-12-24 DIAGNOSIS — U071 COVID-19: Secondary | ICD-10-CM | POA: Diagnosis not present

## 2019-12-26 ENCOUNTER — Other Ambulatory Visit: Payer: Self-pay

## 2019-12-26 ENCOUNTER — Encounter: Payer: Self-pay | Admitting: Emergency Medicine

## 2019-12-26 ENCOUNTER — Emergency Department: Payer: Medicare Other

## 2019-12-26 ENCOUNTER — Emergency Department
Admission: EM | Admit: 2019-12-26 | Discharge: 2019-12-26 | Disposition: A | Discharge status: Home / Self Care | Payer: Medicare Other | Attending: Emergency Medicine | Admitting: Emergency Medicine

## 2019-12-26 DIAGNOSIS — R0602 Shortness of breath: Secondary | ICD-10-CM | POA: Diagnosis not present

## 2019-12-26 DIAGNOSIS — Z5321 Procedure and treatment not carried out due to patient leaving prior to being seen by health care provider: Secondary | ICD-10-CM | POA: Insufficient documentation

## 2019-12-26 DIAGNOSIS — R6889 Other general symptoms and signs: Secondary | ICD-10-CM | POA: Diagnosis not present

## 2019-12-26 DIAGNOSIS — I499 Cardiac arrhythmia, unspecified: Secondary | ICD-10-CM | POA: Diagnosis not present

## 2019-12-26 DIAGNOSIS — J439 Emphysema, unspecified: Secondary | ICD-10-CM | POA: Diagnosis not present

## 2019-12-26 DIAGNOSIS — R069 Unspecified abnormalities of breathing: Secondary | ICD-10-CM | POA: Diagnosis not present

## 2019-12-26 DIAGNOSIS — Z743 Need for continuous supervision: Secondary | ICD-10-CM | POA: Diagnosis not present

## 2019-12-26 LAB — CBC
HCT: 37.9 % (ref 36.0–46.0)
Hemoglobin: 12.5 g/dL (ref 12.0–15.0)
MCH: 30.7 pg (ref 26.0–34.0)
MCHC: 33 g/dL (ref 30.0–36.0)
MCV: 93.1 fL (ref 80.0–100.0)
Platelets: 138 10*3/uL — ABNORMAL LOW (ref 150–400)
RBC: 4.07 MIL/uL (ref 3.87–5.11)
RDW: 14 % (ref 11.5–15.5)
WBC: 6.6 10*3/uL (ref 4.0–10.5)
nRBC: 0 % (ref 0.0–0.2)

## 2019-12-26 LAB — BASIC METABOLIC PANEL
Anion gap: 11 (ref 5–15)
BUN: 22 mg/dL (ref 8–23)
CO2: 29 mmol/L (ref 22–32)
Calcium: 9.1 mg/dL (ref 8.9–10.3)
Chloride: 104 mmol/L (ref 98–111)
Creatinine, Ser: 1.49 mg/dL — ABNORMAL HIGH (ref 0.44–1.00)
GFR, Estimated: 34 mL/min — ABNORMAL LOW (ref >60)
Glucose, Bld: 122 mg/dL — ABNORMAL HIGH (ref 70–99)
Potassium: 3.6 mmol/L (ref 3.5–5.1)
Sodium: 144 mmol/L (ref 135–145)

## 2019-12-26 LAB — TROPONIN I (HIGH SENSITIVITY): Troponin I (High Sensitivity): 23 ng/L — ABNORMAL HIGH (ref <18)

## 2019-12-26 NOTE — ED Notes (Signed)
Patient to ED via EMS from home. Breathing problems x 2 weeks. Worse with exertion. Brushing her teeth and taking a bath make it worse as well. 98% with EMS. No obvious signs of labored breathing.98% on RA. HR 70 - paced. BP 160/73. No fever. Patient able to speak in complete sentences.

## 2019-12-26 NOTE — ED Triage Notes (Signed)
Pt arrived via [facility] from [place] with reports of 2 weeks of shortness of breath with exertion. Pt states over the last couple of days it has worsened.  Denies any cough, denies any COVID contacts. Pt has hx of COPD.

## 2019-12-26 NOTE — ED Notes (Signed)
Called x 2 no answer

## 2019-12-26 NOTE — ED Notes (Signed)
Lab called to add on troponin 

## 2019-12-28 ENCOUNTER — Emergency Department: Payer: Medicare Other

## 2019-12-28 ENCOUNTER — Other Ambulatory Visit: Payer: Self-pay

## 2019-12-28 ENCOUNTER — Emergency Department
Admission: EM | Admit: 2019-12-28 | Discharge: 2019-12-28 | Disposition: A | Discharge status: Home / Self Care | Payer: Medicare Other | Attending: Emergency Medicine | Admitting: Emergency Medicine

## 2019-12-28 ENCOUNTER — Telehealth: Payer: Self-pay | Admitting: Family Medicine

## 2019-12-28 DIAGNOSIS — I4821 Permanent atrial fibrillation: Secondary | ICD-10-CM | POA: Insufficient documentation

## 2019-12-28 DIAGNOSIS — Z79899 Other long term (current) drug therapy: Secondary | ICD-10-CM | POA: Insufficient documentation

## 2019-12-28 DIAGNOSIS — Z7901 Long term (current) use of anticoagulants: Secondary | ICD-10-CM | POA: Diagnosis not present

## 2019-12-28 DIAGNOSIS — I10 Essential (primary) hypertension: Secondary | ICD-10-CM | POA: Diagnosis not present

## 2019-12-28 DIAGNOSIS — Z20822 Contact with and (suspected) exposure to covid-19: Secondary | ICD-10-CM | POA: Diagnosis not present

## 2019-12-28 DIAGNOSIS — Z87891 Personal history of nicotine dependence: Secondary | ICD-10-CM | POA: Insufficient documentation

## 2019-12-28 DIAGNOSIS — R0609 Other forms of dyspnea: Secondary | ICD-10-CM | POA: Diagnosis present

## 2019-12-28 DIAGNOSIS — Z950 Presence of cardiac pacemaker: Secondary | ICD-10-CM | POA: Diagnosis not present

## 2019-12-28 DIAGNOSIS — J441 Chronic obstructive pulmonary disease with (acute) exacerbation: Secondary | ICD-10-CM | POA: Diagnosis not present

## 2019-12-28 DIAGNOSIS — R918 Other nonspecific abnormal finding of lung field: Secondary | ICD-10-CM | POA: Diagnosis not present

## 2019-12-28 LAB — CBC
HCT: 42.2 % (ref 36.0–46.0)
Hemoglobin: 13.6 g/dL (ref 12.0–15.0)
MCH: 30.3 pg (ref 26.0–34.0)
MCHC: 32.2 g/dL (ref 30.0–36.0)
MCV: 94 fL (ref 80.0–100.0)
Platelets: 141 10*3/uL — ABNORMAL LOW (ref 150–400)
RBC: 4.49 MIL/uL (ref 3.87–5.11)
RDW: 14 % (ref 11.5–15.5)
WBC: 6.4 10*3/uL (ref 4.0–10.5)
nRBC: 0 % (ref 0.0–0.2)

## 2019-12-28 LAB — BASIC METABOLIC PANEL
Anion gap: 12 (ref 5–15)
BUN: 26 mg/dL — ABNORMAL HIGH (ref 8–23)
CO2: 28 mmol/L (ref 22–32)
Calcium: 9.1 mg/dL (ref 8.9–10.3)
Chloride: 103 mmol/L (ref 98–111)
Creatinine, Ser: 1.55 mg/dL — ABNORMAL HIGH (ref 0.44–1.00)
GFR, Estimated: 32 mL/min — ABNORMAL LOW (ref >60)
Glucose, Bld: 90 mg/dL (ref 70–99)
Potassium: 3.9 mmol/L (ref 3.5–5.1)
Sodium: 143 mmol/L (ref 135–145)

## 2019-12-28 LAB — RESP PANEL BY RT-PCR (FLU A&B, COVID) ARPGX2
Influenza A by PCR: NEGATIVE
Influenza B by PCR: NEGATIVE
SARS Coronavirus 2 by RT PCR: NEGATIVE

## 2019-12-28 LAB — TROPONIN I (HIGH SENSITIVITY)
Troponin I (High Sensitivity): 23 ng/L — ABNORMAL HIGH (ref <18)
Troponin I (High Sensitivity): 26 ng/L — ABNORMAL HIGH (ref <18)

## 2019-12-28 MED ORDER — PREDNISONE 20 MG PO TABS: 40.0000 mg | ORAL_TABLET | Freq: Every day | ORAL | 0 refills | Status: AC

## 2019-12-28 MED ORDER — DOXYCYCLINE HYCLATE 100 MG PO CAPS: 100.0000 mg | ORAL_CAPSULE | Freq: Two times a day (BID) | ORAL | 0 refills | Status: AC

## 2019-12-28 MED ORDER — PREDNISONE 20 MG PO TABS
40.0000 mg | ORAL_TABLET | ORAL | Status: AC
  Administered 2019-12-28: 40 mg via ORAL
  Filled 2019-12-28: qty 2

## 2019-12-28 NOTE — ED Triage Notes (Signed)
PT here with SOB x1 week. Pt denies any other symptoms.  Pt NAD in triage.

## 2019-12-28 NOTE — Telephone Encounter (Signed)
Please call the patient and her facility. She was in the ED for shortness of breath over the weekend, though did not stay to be evaluated. Her troponin was elevated. She needs to be seen in the ED for further evaluation of this as it could indicate she is having a heart attack or some other heart trouble.

## 2019-12-28 NOTE — Telephone Encounter (Signed)
Noted. Please follow-up with them later today to determine if she went to be evaluated.

## 2019-12-28 NOTE — Telephone Encounter (Signed)
I called the Director of the facility Tam[facility] and explained to her that the patient went to the ER on the weekend and her Troponin level was elevated and she needed to be seen in the ER for a possible heart attack.  She stated she would call the patient's son to get her to the hospital to be evaluated, the pateint was informed as well and she understood.  Alison Breeding,cma

## 2019-12-28 NOTE — Discharge Instructions (Signed)
Your chest xray and lab tests today were reassuring.  Take prednisone daily along with doxycycline and continue using your ProAir every 4 hours.  Please follow up with your pulmonologist and primary care doctor.

## 2019-12-28 NOTE — ED Provider Notes (Signed)
[facility] Emergency Department Provider Note  ____________________________________________  Time seen: Approximately 5:07 PM  I have reviewed the triage vital signs and the nursing notes.   HISTORY  Chief Complaint Shortness of Breath    HPI Jessica [name] is a 86 y.o. female with a history of AAA, COPD, hypertension, cardiac pacemaker who comes the ED complaining of dyspnea on exertion.  This is a chronic issue for her, going on for greater than 1 year according to EMR.  She feels like it is a bit worse in the past week.  No cough or wheezing, no fevers chills body aches chest pain or other pain complaints.  No vomiting or diarrhea, no fatigue or dizziness.  Feels better after using her proair inhaler.  Review of EMR shows she was seen by pulmonology 08/25/2019 for similar symptoms that were described as being present for more than a year at that time.  She is on Eliquis for atrial fibrillation.      Past Medical History:  Diagnosis Date  . AAA (abdominal aortic aneurysm) ([place])   . Atrioventricular block, complete ([place])   . Cardiac pacemaker in situ   . COPD (chronic obstructive pulmonary disease) ([place])   . Gout   . Hernia   . Hyperlipidemia   . Hypertension   . Kidney stone   . Presence of permanent cardiac pacemaker   . Rectal bleeding      Patient Active Problem List   Diagnosis Date Noted  . Atypical chest pain 08/25/2019  . Urothelial cancer ([place]) 01/18/2019  . Nephrolithiasis 01/18/2019  . Urinary tract infection symptoms 12/31/2018  . Bleeding hemorrhoids 02/27/2018  . Weight loss 08/30/2017  . Low back pain 08/30/2017  . Depression, major, single episode, mild ([place]) 05/20/2017  . Fall 05/20/2017  . Hypoglycemia 04/04/2017  . Gout 06/05/2016  . AAA (abdominal aortic aneurysm) without rupture ([place]) 04/18/2016  . Loss of sense of smell 12/30/2015  . Memory difficulties 12/30/2015  . Injury of right lower extremity and cellulitis  11/29/2014  . 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 Renal artery stenosis ([place]) 09/15/2014  . 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 CN (constipation) 09/06/2014  . Change in stool caliber 09/06/2014  . Atherosclerosis of aorto-iliac bypass graft ([place]) 08/17/2014  . Shortness of breath 05/12/2014  . Conjunctival hemorrhage of right eye 08/13/2013  . Medicare annual wellness visit, subsequent 04/22/2013  . Allergic rhinitis 11/26/2012  . COPD (chronic obstructive pulmonary disease) ([place]) 09/27/2011  . Hyperlipidemia 02/22/2011  . Long term current use of anticoagulant 04/12/2010  . HYPERTENSION, BENIGN 08/25/2009  . Atrial fibrillation ([place]) 03/07/2009  . Carotid stenosis 08/24/2008  . AV BLOCK, COMPLETE 06/17/2008  . Aneurysm of abdominal vessel ([place]) 06/17/2008  . PACEMAKER-[place] 06/17/2008     Past Surgical History:  Procedure Laterality Date  . ABDOMINAL AORTIC ANEURYSM REPAIR     son denies  . CARDIOVERSION N/A 02/06/2019   Procedure: CARDIOVERSION;  Surgeon: Minna Merritts, MD;  Location: [facility] ORS;  Service: Cardiovascular;  Laterality: N/A;  . CYSTOSCOPY/URETEROSCOPY/HOLMIUM LASER/STENT PLACEMENT Right 01/01/2019   Procedure: CYSTOSCOPY/URETEROSCOPY/HOLMIUM LASER/STENT PLACEMENT;  Surgeon: Billey Co, MD;  Location: [facility] ORS;  Service: Urology;  Laterality: Right;  . HERNIA REPAIR    . ohter     growth on colon surgery  . PPM GENERATOR CHANGEOUT N/A 02/25/2017   Procedure: PPM GENERATOR CHANGEOUT;  Surgeon: Deboraha Sprang, MD;  Location: [place] [facility];  Service: Cardiovascular;  Laterality: N/A;  .  RENAL ANGIOGRAPHY Right 12/03/2017   Procedure: RENAL ANGIOGRAPHY;  Surgeon: Katha Cabal, MD;  Location: [place] [facility];  Service: Cardiovascular;  Laterality: Right;  . TONSILLECTOMY       Prior to Admission medications   Medication Sig Start Date End Date Taking? Authorizing Provider  acetaminophen (TYLENOL) 500 MG tablet Take 500 mg  by mouth every 6 (six) hours as needed for moderate pain or headache.    [provider]  albuterol (PROAIR HFA) 108 (90 Base) MCG/ACT inhaler Inhale 2 puffs into the lungs every 6 (six) hours as needed for wheezing or shortness of breath. 02/24/18   Jodelle Green, FNP  amLODipine (NORVASC) 5 MG tablet Take 1 tablet (5 mg total) by mouth daily. Please call to schedule office visit for further refills. 01/30/19   Minna Merritts, MD  apixaban (ELIQUIS) 2.5 MG TABS tablet Take 1 tablet (2.5 mg total) by mouth 2 (two) times daily. 01/06/19   Deboraha Sprang, MD  atenolol (TENORMIN) 25 MG tablet Take 1 tablet (25 mg total) by mouth daily. Please call to schedule office visit for further refills. 01/30/19   Minna Merritts, MD  cholecalciferol 25 MCG (1000 UT) TABS Take 1,000 Units by mouth daily.    [provider]  doxycycline (VIBRAMYCIN) 100 MG capsule Take 1 capsule (100 mg total) by mouth 2 (two) times daily for 10 days. 12/28/19 01/07/20  Carrie Mew, MD  Fluticasone-Salmeterol (ADVAIR DISKUS) 250-50 MCG/DOSE AEPB Inhale 1 puff into the lungs in the morning and at bedtime. 08/25/19   Leone Haven, MD  furosemide (LASIX) 20 MG tablet Take 1 tablet (20 mg total) by mouth as needed (for shortness of breath/edema). 07/28/19 11/03/19  Loel Dubonnet, NP  melatonin 3 MG TABS tablet Take 1 tablet (3 mg) by mouth once daily at bedtime as needed for sleep    [provider]  predniSONE (DELTASONE) 20 MG tablet Take 2 tablets (40 mg total) by mouth daily for 4 days. 12/28/19 01/01/20  Carrie Mew, MD  rosuvastatin (CRESTOR) 5 MG tablet TAKE 1 TABLET BY MOUTH DAILY USUALLY IN THE EVENING 08/25/18   Gollan, Kathlene November, MD  senna-docusate (SENOKOT-S) 8.6-50 MG tablet Take 2 tablets by mouth once daily at bedtime as needed for constipation    [provider]  tamsulosin (FLOMAX) 0.4 MG CAPS capsule Take 1 capsule (0.4 mg total) by mouth daily. 12/25/18   Harvest Dark, MD  vitamin B-12 (CYANOCOBALAMIN) 1000 MCG tablet Take 1 tablet (1000 mg) by mouth once daily at bedtime    [provider]  citalopram (CELEXA) 10 MG tablet Take 1 tablet (10 mg total) by mouth daily. 10/08/18 11/12/18  Jodelle Green, FNP     Allergies Codeine, Morphine, and Zyrtec [cetirizine]   Family History  Problem Relation Age of Onset  . Heart disease Father   . Alcohol abuse Father   . Cancer Brother   . Other Mother 94       MVA    Social History Social History   Tobacco Use  . Smoking status: Former Smoker    Packs/day: 1.50    Years: 50.00    Pack years: 75.00    Types: Cigarettes    Quit date: 10/03/1995    Years since quitting: 24.2  . Smokeless tobacco: Never Used  Vaping Use  . Vaping Use: Never used  Substance Use Topics  . Alcohol use: Not Currently  . Drug use: No  Review of Systems  Constitutional:   No fever or chills.  ENT:   No sore throat. No rhinorrhea. Cardiovascular:   No chest pain or syncope. Respiratory: Positive shortness of breath without cough. Gastrointestinal:   Negative for abdominal pain, vomiting and diarrhea.  Musculoskeletal:   Negative for focal pain or swelling All other systems reviewed and are negative except as documented above in ROS and HPI.  ____________________________________________   PHYSICAL EXAM:  VITAL SIGNS: ED Triage Vitals  Enc Vitals Group     BP 12/28/19 1212 (!) 159/68     Pulse Rate 12/28/19 1212 69     Resp 12/28/19 1212 18     Temp 12/28/19 1212 98.6 F (37 C)     Temp Source 12/28/19 1212 Oral     SpO2 12/28/19 1212 98 %     Weight 12/28/19 1213 135 lb (61.2 kg)     Height 12/28/19 1213 5\' 5"  (1.651 m)     Head Circumference --      Peak Flow --      Pain Score 12/28/19 1229 0     Pain Loc --      Pain Edu? --      Excl. in [place]? --     Vital signs reviewed, nursing assessments reviewed.   Constitutional:   Alert and oriented. Non-toxic appearance. Eyes:    Conjunctivae are normal. EOMI. PERRL. ENT      Head:   Normocephalic and atraumatic.      Nose:   Wearing a mask.      Mouth/Throat:   Wearing a mask.      Neck:   No meningismus. Full ROM. Hematological/Lymphatic/Immunilogical:   No cervical lymphadenopathy. Cardiovascular:   RRR. Symmetric bilateral radial and DP pulses.  No murmurs. Cap refill less than 2 seconds. Respiratory:   Normal respiratory effort without tachypnea/retractions.  Breath sounds are quiet diffusely with prolonged expiratory phase and faint expiratory wheezing. Gastrointestinal:   Soft and nontender. Non distended. There is no CVA tenderness.  No rebound, rigidity, or guarding.  Musculoskeletal:   Normal range of motion in all extremities. No joint effusions.  No lower extremity tenderness.  No edema. Neurologic:   Normal speech and language.  Motor grossly intact. No acute focal neurologic deficits are appreciated.  Skin:    Skin is warm, dry and intact. No rash noted.  No petechiae, purpura, or bullae.  ____________________________________________    LABS (pertinent positives/negatives) (all labs ordered are listed, but only abnormal results are displayed) Labs Reviewed  CBC - Abnormal; Notable for the following components:      Result Value   Platelets 141 (*)    All other components within normal limits  BASIC METABOLIC PANEL - Abnormal; Notable for the following components:   BUN 26 (*)    Creatinine, Ser 1.55 (*)    GFR, Estimated 32 (*)    All other components within normal limits  TROPONIN I (HIGH SENSITIVITY) - Abnormal; Notable for the following components:   Troponin I (High Sensitivity) 23 (*)    All other components within normal limits  TROPONIN I (HIGH SENSITIVITY) - Abnormal; Notable for the following components:   Troponin I (High Sensitivity) 26 (*)    All other components within normal limits  RESP PANEL BY RT-PCR (FLU A&B, COVID) [id number]    ____________________________________________   EKG  Interpreted by me Ventricular paced rhythm, rate of 70.  Right axis, left bundle branch block, no acute ischemic changes.  ____________________________________________  RADIOLOGY  DG Chest 2 View  Result Date: 12/28/2019 CLINICAL DATA:  Shortness of breath. EXAM: CHEST - 2 VIEW COMPARISON:  12/26/2019. FINDINGS: The heart size and mediastinal contours are within normal limits. Left subclavian cardiac rhythm maintenance device in similar position. Both lungs are clear. Chronic hyperinflation. No visible pleural effusions or pneumothorax. Biapical pleuroparenchymal scarring. No acute osseous abnormality. IMPRESSION: 1. No active cardiopulmonary disease. 2. Chronic hyperinflation, suggesting COPD/emphysema. Electronically Signed   By: Margaretha Sheffield MD   On: 12/28/2019 13:10    ____________________________________________   PROCEDURES Procedures  ____________________________________________  DIFFERENTIAL DIAGNOSIS   COPD exacerbation, pneumonia, pleural effusion, pulmonary edema, non-STEMI  CLINICAL IMPRESSION / ASSESSMENT AND PLAN / ED COURSE  Medications ordered in the ED: Medications  predniSONE (DELTASONE) tablet 40 mg (has no administration in time range)    Pertinent labs & imaging results that were available during my care of the patient were reviewed by me and considered in my medical decision making (see chart for details).  KJERSTI DITTMER was evaluated in Emergency Department on 12/28/2019 for the symptoms described in the history of present illness. She was evaluated in the context of the global COVID-19 pandemic, which necessitated consideration that the patient might be at risk for infection with the SARS-CoV-2 virus that causes COVID-19. Institutional protocols and algorithms that pertain to the evaluation of patients at risk for COVID-19 are in a state of rapid change based on information released by  regulatory bodies including the CDC and federal and state organizations. These policies and algorithms were followed during the patient's care in the ED.   Patient presents with dyspnea on exertion which is a chronic issue for her little bit worse in the past week.  She has been compliant with her medications.  Labs are reassuring.  Troponin slightly elevated at 23 and 26, and review of EMR shows that it was 23 2 days ago when she left without being seen and was also slightly elevated on the previous assay in 2019, consistent with chronic baseline.  Doubt any acute ACS, dissection, PE, aortic aneurysm, carditis, pericardial effusion.  She is nontoxic, vital signs unremarkable, no hypoxia.    Chest x-ray image reviewed by me, consistent with COPD.  Radiology report confirms this.  Will treat with doxycycline, prednisone, continue inhaler, follow-up with pulmonology and primary care.      ____________________________________________   FINAL CLINICAL IMPRESSION(S) / ED DIAGNOSES    Final diagnoses:  COPD exacerbation ([place])  Permanent atrial fibrillation [facility])     ED Discharge Orders         Ordered    predniSONE (DELTASONE) 20 MG tablet  Daily        12/28/19 1706    doxycycline (VIBRAMYCIN) 100 MG capsule  2 times daily        12/28/19 1706          Portions of this note were generated with dragon dictation software. Dictation errors may occur despite best attempts at proofreading.   Carrie Mew, MD 12/28/19 [phone]

## 2019-12-28 NOTE — ED Notes (Signed)
[place]bow sent to the lab.  

## 2019-12-29 ENCOUNTER — Ambulatory Visit (INDEPENDENT_AMBULATORY_CARE_PROVIDER_SITE_OTHER): Payer: Medicare Other

## 2019-12-29 DIAGNOSIS — I442 Atrioventricular block, complete: Secondary | ICD-10-CM

## 2019-12-29 LAB — CUP PACEART REMOTE DEVICE CHECK
Battery Remaining Longevity: 121 mo
Battery Remaining Percentage: 95.5 %
Battery Voltage: 2.99 V
Brady Statistic AP VP Percent: 91 %
Brady Statistic AP VS Percent: 1 %
Brady Statistic AS VP Percent: 8.5 %
Brady Statistic AS VS Percent: 1 %
Brady Statistic RA Percent Paced: 14 %
Brady Statistic RV Percent Paced: 99 %
Date Time Interrogation Session: 20211207040016
Implantable Lead Implant Date: 20090610
Implantable Lead Implant Date: 20090610
Implantable Lead Location: 753859
Implantable Lead Location: 753860
Implantable Lead Model: 5076
Implantable Pulse Generator Implant Date: 20190204
Lead Channel Impedance Value: 330 Ohm
Lead Channel Impedance Value: 440 Ohm
Lead Channel Pacing Threshold Amplitude: 0.75 V
Lead Channel Pacing Threshold Amplitude: 0.75 V
Lead Channel Pacing Threshold Pulse Width: 0.5 ms
Lead Channel Pacing Threshold Pulse Width: 0.5 ms
Lead Channel Sensing Intrinsic Amplitude: 1.5 mV
Lead Channel Sensing Intrinsic Amplitude: 3.3 mV
Lead Channel Setting Pacing Amplitude: 1 V
Lead Channel Setting Pacing Amplitude: 2 V
Lead Channel Setting Pacing Pulse Width: 0.5 ms
Lead Channel Setting Sensing Sensitivity: 2 mV
Pulse Gen Model: 2272
Pulse Gen Serial Number: 8991929

## 2019-12-29 NOTE — Telephone Encounter (Signed)
Patient was seen but not admitted, sent home.  Haille Pardi,cma

## 2019-12-30 ENCOUNTER — Encounter: Payer: Self-pay | Admitting: Family

## 2019-12-30 ENCOUNTER — Telehealth: Payer: Self-pay | Admitting: Family

## 2019-12-30 ENCOUNTER — Other Ambulatory Visit: Payer: Self-pay

## 2019-12-30 ENCOUNTER — Ambulatory Visit: Payer: Medicare Other | Admitting: Family

## 2019-12-30 VITALS — BP 132/68 | HR 70 | Ht 65.0 in | Wt 135.0 lb

## 2019-12-30 DIAGNOSIS — E782 Mixed hyperlipidemia: Secondary | ICD-10-CM

## 2019-12-30 DIAGNOSIS — Z950 Presence of cardiac pacemaker: Secondary | ICD-10-CM

## 2019-12-30 DIAGNOSIS — Z7901 Long term (current) use of anticoagulants: Secondary | ICD-10-CM | POA: Diagnosis not present

## 2019-12-30 DIAGNOSIS — I4821 Permanent atrial fibrillation: Secondary | ICD-10-CM | POA: Diagnosis not present

## 2019-12-30 DIAGNOSIS — J449 Chronic obstructive pulmonary disease, unspecified: Secondary | ICD-10-CM | POA: Diagnosis not present

## 2019-12-30 DIAGNOSIS — I10 Essential (primary) hypertension: Secondary | ICD-10-CM | POA: Diagnosis not present

## 2019-12-30 MED ORDER — ROSUVASTATIN CALCIUM 5 MG PO TABS: 5.0000 mg | ORAL_TABLET | Freq: Every day | ORAL | 3 refills | Status: DC

## 2019-12-30 NOTE — Telephone Encounter (Signed)
Please resend crestor .  The pharmacy did not receive.

## 2019-12-30 NOTE — Patient Instructions (Signed)
Medication Instructions:  Crestor 5mg  daily was not on your medication list from [facility] - we will route a note to [facility] to ensure you are taking Crestor 5mg  daily.   *If you need a refill on your cardiac medications before your next appointment, please call your pharmacy*   Lab Work: None ordered today.   Testing/Procedures: Your EKG today shows your pacemaker is functioning appropriately.   Follow-Up: At [facility], you and your health needs are our priority.  As part of our continuing mission to provide you with exceptional heart care, we have created designated Provider Care Teams.  These Care Teams include your primary Cardiologist (physician) and Advanced Practice Providers (APPs -  Physician Assistants and Nurse Practitioners) who all work together to provide you with the care you need, when you need it.  We recommend signing up for the patient portal called "MyChart".  Sign up information is provided on this After Visit Summary.  MyChart is used to connect with patients for Virtual Visits (Telemedicine).  Patients are able to view lab/test results, encounter notes, upcoming appointments, etc.  Non-urgent messages can be sent to your provider as well.   To learn more about what you can do with MyChart, go to [email or web address].    Your next appointment:   6 month(s)  The format for your next appointment:   In Person  Provider:   You may see Ida Rogue, MD or one of the following Advanced Practice Providers on your designated Care Team:    Murray Hodgkins, NP  Christell Faith, PA-C  Marrianne Mood, PA-C  Cadence [place], [place]  Laurann [place], NP

## 2019-12-30 NOTE — Addendum Note (Signed)
Addended by: Loel Dubonnet on: 12/30/2019 04:48 PM   Modules accepted: Orders

## 2019-12-30 NOTE — Progress Notes (Addendum)
Office Visit    Patient Name: Jessica [name] Date of Encounter: 12/30/2019  Primary Care Provider:  [facility] Primary Cardiologist:  Ida Rogue, MD Electrophysiologist:  None   Chief Complaint    Jessica [name] is a 86 y.o. female with a hx of persistent fibrillation, complete heart block s/p PPM, HTN/orthostatic hypotension, renal artery stenosis s/p renal artery stenting 10/2017, PVD s/p AAA repair 2009, COPD, HLD, bialteral carotid artery stenosis (mild) presents today for follow up after ED visit.   Past Medical History    Past Medical History:  Diagnosis Date  . AAA (abdominal aortic aneurysm) ([place])   . Atrioventricular block, complete ([place])   . Cardiac pacemaker in situ   . COPD (chronic obstructive pulmonary disease) ([place])   . Gout   . Hernia   . Hyperlipidemia   . Hypertension   . Kidney stone   . Presence of permanent cardiac pacemaker   . Rectal bleeding    Past Surgical History:  Procedure Laterality Date  . ABDOMINAL AORTIC ANEURYSM REPAIR     son denies  . CARDIOVERSION N/A 02/06/2019   Procedure: CARDIOVERSION;  Surgeon: Minna Merritts, MD;  Location: [facility];  Service: Cardiovascular;  Laterality: N/A;  . CYSTOSCOPY/URETEROSCOPY/HOLMIUM LASER/STENT PLACEMENT Right 01/01/2019   Procedure: CYSTOSCOPY/URETEROSCOPY/HOLMIUM LASER/STENT PLACEMENT;  Surgeon: Billey Co, MD;  Location: [facility];  Service: Urology;  Laterality: Right;  . HERNIA REPAIR    . ohter     growth on colon surgery  . PPM GENERATOR CHANGEOUT N/A 02/25/2017   Procedure: PPM GENERATOR CHANGEOUT;  Surgeon: Deboraha Sprang, MD;  Location: [facility];  Service: Cardiovascular;  Laterality: N/A;  . RENAL ANGIOGRAPHY Right 12/03/2017   Procedure: RENAL ANGIOGRAPHY;  Surgeon: Katha Cabal, MD;  Location: [facility];  Service: Cardiovascular;  Laterality: Right;  . TONSILLECTOMY      Allergies  Allergies  Allergen Reactions  . Codeine  Other (See Comments)    Made her feel crazy  . Morphine Other (See Comments)    Made her feel crazy  . Zyrtec [Cetirizine] Other (See Comments)    Causes facial numbness    History of Present Illness    Jessica [name] is a 86 y.o. female with a hx of persistent fibrillation, complete heart block s/p PPM, HTN/orthostatic hypotension, renal artery stenosis s/p renal artery stenting 10/2017, PVD s/p AAA repair 2009, COPD, HLD, bialteral carotid artery stenosis (mild), dementia, nephrolithiasis/atrophic left kidney with known filling defect in right lower pole kidney since 2017 (right upper tract urothelial cell carcinoma).  She was last seen via video visit by Dr. Caryl Comes 04/2019.  Previously ischemia eval includes Lexiscan Myoview 07/2016 with no evidence of ischemia, normal LVEF.  Echocardiogram 08/2016 with LVEF 65%, moderate LVH, no significant valvular abnormalities.  She was seen in follow-up July 2021 noting increasing dyspnea on exertion over the prior 3 months.  She was started on Lasix 20 mg daily for 4 days then Lasix 20 mg as needed.  Dyspnea was thought to be multifactorial deconditioning, elevated pulmonary pressures, COPD.  She is now in follow-up by Dr. Caryl Comes 11/03/2019.  She noted dyspnea and some mild volume overload on exam, she was recommended to take Lasix 40 mg for 3 days.  Discussed typical course of atrial fibrillation and she was agreeable to pursue course of permanent atrial fibrillation.  She presented to the ED 01/03/2020 noting increased dyspnea on exertion but did not wait to  be seen.  Preliminary work-up included chest x-ray showing emphysema, high-sensitivity troponin 23, hemoglobin 12.5, K3.6, creatinine 1.49, GFR 34.  She was seen in the ED 12/28/2019 due to shortness of breath. HS troponin 23 ? 26 with noted mildly elevated chronic baseline. CXR consistent with COPD. She was started on doxycyline and prednisone. Recommended to continue her inhaler and follow up with  pulmonology and primary care.  Flu swab and Covid swab were negative.  She presents today for follow-up with her son.  She reports some improvement in her dyspnea since starting the antibiotic and prednisone.  She reports no chest pain, pressure, tightness.  We reviewed her minimally elevated troponins in the hospital which were flat.  We discussed that this was likely due to her COPD exacerbation.  She has an upcoming appointment with pulmonology.  EKGs/Labs/Other Studies Reviewed:   The following studies were reviewed today:  Echo 2018 Study Conclusions   - Technically difficult study due to chest wall and/or lung    interference.  - Left ventricle: The cavity size was normal. Wall thickness was    increased in a pattern of moderate LVH. Systolic function was    normal. The estimated ejection fraction was in the range of 60%    to 65%. Regional wall motion abnormalities cannot be excluded.    The study is not technically sufficient to allow evaluation of LV    diastolic function.  - Aortic valve: Poorly visualized. Moderately thickened, moderately    calcified leaflets. Sclerosis without stenosis.  - Left atrium: The atrium was mildly dilated.  - Right ventricle: The cavity size was normal. Pacer wire or    catheter noted in right ventricle.  - Atrial septum: There was increased thickness of the septum,    consistent with lipomatous hypertrophy.  - Tricuspid valve: There was mild regurgitation.  - Pulmonary arteries: Systolic pressure was mildly to moderately    increased, in the range of 40 mm Hg to 45 mm Hg.   EKG:  EKG is ordered today.  The ekg ordered today demonstrates V paced rhythm 70 bpm.  Recent Labs: 08/25/2019: TSH 1.12 12/28/2019: BUN 26; Creatinine, Ser 1.55; Hemoglobin 13.6; Platelets 141; Potassium 3.9; Sodium 143  Recent Lipid Panel    Component Value Date/Time   CHOL 143 08/30/2017 1028   CHOL 181 01/03/2012 0837   TRIG 98.0 08/30/2017 1028   HDL 57.80  08/30/2017 1028   HDL 61 01/03/2012 0837   CHOLHDL 2 08/30/2017 1028   VLDL 19.6 08/30/2017 1028   LDLCALC 65 08/30/2017 1028   LDLCALC 84 01/03/2012 0837   LDLDIRECT 186.2 02/22/2011 1414    Home Medications   Current Meds  Medication Sig  . acetaminophen (TYLENOL) 500 MG tablet Take 500 mg by mouth every 6 (six) hours as needed for moderate pain or headache.  . albuterol (PROAIR HFA) 108 (90 Base) MCG/ACT inhaler Inhale 2 puffs into the lungs every 6 (six) hours as needed for wheezing or shortness of breath.  Marland Kitchen amLODipine (NORVASC) 5 MG tablet Take 1 tablet (5 mg total) by mouth daily. Please call to schedule office visit for further refills.  Marland Kitchen apixaban (ELIQUIS) 2.5 MG TABS tablet Take 1 tablet (2.5 mg total) by mouth 2 (two) times daily.  Marland Kitchen atenolol (TENORMIN) 25 MG tablet Take 1 tablet (25 mg total) by mouth daily. Please call to schedule office visit for further refills.  . cholecalciferol 25 MCG (1000 UT) TABS Take 1,000 Units by mouth daily.  Marland Kitchen  doxycycline (VIBRAMYCIN) 100 MG capsule Take 1 capsule (100 mg total) by mouth 2 (two) times daily for 10 days.  . Fluticasone-Salmeterol (ADVAIR DISKUS) 250-50 MCG/DOSE AEPB Inhale 1 puff into the lungs in the morning and at bedtime.  . furosemide (LASIX) 20 MG tablet Take 1 tablet (20 mg total) by mouth as needed (for shortness of breath/edema).  . melatonin 3 MG TABS tablet Take 1 tablet (3 mg) by mouth once daily at bedtime as needed for sleep  . predniSONE (DELTASONE) 20 MG tablet Take 2 tablets (40 mg total) by mouth daily for 4 days.  . rosuvastatin (CRESTOR) 5 MG tablet TAKE 1 TABLET BY MOUTH DAILY USUALLY IN THE EVENING  . senna-docusate (SENOKOT-S) 8.6-50 MG tablet Take 2 tablets by mouth once daily at bedtime as needed for constipation  . tamsulosin (FLOMAX) 0.4 MG CAPS capsule Take 1 capsule (0.4 mg total) by mouth daily.  . vitamin B-12 (CYANOCOBALAMIN) 1000 MCG tablet Take 1 tablet (1000 mg) by mouth once daily at bedtime     Review of Systems      Review of Systems  Constitutional: Negative for chills, fever and malaise/fatigue.  Cardiovascular: Positive for dyspnea on exertion. Negative for chest pain, irregular heartbeat, leg swelling, near-syncope, orthopnea, palpitations and syncope.  Respiratory: Negative for cough, shortness of breath and wheezing.   Gastrointestinal: Negative for melena, nausea and vomiting.  Genitourinary: Negative for hematuria.  Neurological: Negative for dizziness, light-headedness and weakness.   All other systems reviewed and are otherwise negative except as noted above.  Physical Exam    VS:  BP 132/68 (BP Location: Left Arm, Patient Position: Sitting, Cuff Size: Normal)   Pulse 70   Ht 5\' 5"  (1.651 m)   Wt 135 lb (61.2 kg)   SpO2 93%   BMI 22.47 kg/m  , BMI Body mass index is 22.47 kg/m. GEN: Well nourished, well developed, in no acute distress. HEENT: normal. Neck: Supple, no JVD, carotid bruits, or masses. Cardiac: RRR, no murmurs, rubs, or gallops. No clubbing, cyanosis. Trace right pedal edema. .  Radials/PT 2+ and equal bilaterally.  Respiratory:  Respirations regular and unlabored.  Left lower lobe with rhonchi.   GI: Soft, nontender, nondistended, MS: No deformity or atrophy. Skin: Warm and dry, no rash. Neuro:  Strength and sensation are intact. Psych: Normal affect.  Assessment & Plan   1. DOE / [place] likely multifactorial deconditioning, elevated pulmonary artery pressures, COPD.  Echo 2018 with normal LVEF, mild to moderately elevated PASP, no significant valvular abnormalities.  Presently with COPD exacerbation and has been started on.  No statin and doxycycline 2 days ago as well as Advair.  She has upcoming pulmonology appointment.  2. CHB s/p PPM / Persistent atrial fib / Chronic anticoagulation-Continue to follow with Dr. Caryl Comes.  She has known high atrial fibrillation burden with most recent device check 85%.  She and Dr. Caryl Comes have had a  discussion and decided against pursuing further cardioversion as she is overall asymptomatic when in atrial fibrillation. Denies bleeding complications on Eliquis 2.5 mg twice daily.  Reduced dose due to age/body habitus.  3. HTN/orthostatic hypotension - BP well controlled. Continue current antihypertensive regimen.  Reports no recent orthostatic symptoms.  4. HLD - Continue Crestor 5 mg daily.  Of note, this was not on her list from her ALF.  We will forward note to her ALF to resume Crestor 5 mg daily. Rx printed and faxed to her ALF  5. PAD/PVD s/p AAA repair  by [facility] -Reports no symptoms concerning for worsening claudication.  Continue to follow with vascular surgery.  Disposition: Follow up  in 6 month(s) with Dr. Wallie Renshaw, NP 12/30/2019, 4:38 PM

## 2019-12-31 DIAGNOSIS — E559 Vitamin D deficiency, unspecified: Secondary | ICD-10-CM | POA: Diagnosis not present

## 2019-12-31 DIAGNOSIS — J449 Chronic obstructive pulmonary disease, unspecified: Secondary | ICD-10-CM | POA: Diagnosis not present

## 2019-12-31 DIAGNOSIS — E785 Hyperlipidemia, unspecified: Secondary | ICD-10-CM | POA: Diagnosis not present

## 2019-12-31 DIAGNOSIS — K5900 Constipation, unspecified: Secondary | ICD-10-CM | POA: Diagnosis not present

## 2019-12-31 DIAGNOSIS — I119 Hypertensive heart disease without heart failure: Secondary | ICD-10-CM | POA: Diagnosis not present

## 2019-12-31 DIAGNOSIS — E119 Type 2 diabetes mellitus without complications: Secondary | ICD-10-CM | POA: Diagnosis not present

## 2019-12-31 DIAGNOSIS — I4891 Unspecified atrial fibrillation: Secondary | ICD-10-CM | POA: Diagnosis not present

## 2019-12-31 DIAGNOSIS — D518 Other vitamin B12 deficiency anemias: Secondary | ICD-10-CM | POA: Diagnosis not present

## 2019-12-31 DIAGNOSIS — E038 Other specified hypothyroidism: Secondary | ICD-10-CM | POA: Diagnosis not present

## 2019-12-31 MED ORDER — ROSUVASTATIN CALCIUM 5 MG PO TABS: 5.0000 mg | ORAL_TABLET | Freq: Every day | ORAL | 3 refills | Status: DC

## 2019-12-31 MED ORDER — ROSUVASTATIN CALCIUM 5 MG PO TABS: 5.0000 mg | ORAL_TABLET | Freq: Every day | ORAL | 3 refills | Status: AC

## 2019-12-31 NOTE — Telephone Encounter (Signed)
Requested Prescriptions   Signed Prescriptions Disp Refills  . rosuvastatin (CRESTOR) 5 MG tablet 90 tablet 3    Sig: Take 1 tablet (5 mg total) by mouth daily.    Authorizing Provider: Loel Dubonnet    Ordering User: Britt Bottom

## 2019-12-31 NOTE — Telephone Encounter (Signed)
Requested Prescriptions   Signed Prescriptions Disp Refills   rosuvastatin (CRESTOR) 5 MG tablet 90 tablet 3    Sig: Take 1 tablet (5 mg total) by mouth daily.    Authorizing Provider: Loel Dubonnet    Ordering User: Britt Bottom

## 2020-01-05 DIAGNOSIS — M79642 Pain in left hand: Secondary | ICD-10-CM | POA: Diagnosis not present

## 2020-01-05 DIAGNOSIS — H2513 Age-related nuclear cataract, bilateral: Secondary | ICD-10-CM | POA: Diagnosis not present

## 2020-01-06 ENCOUNTER — Ambulatory Visit: Payer: Medicare Other | Admitting: Family

## 2020-01-07 ENCOUNTER — Ambulatory Visit: Payer: Medicare Other | Admitting: Pulmonary Disease

## 2020-01-07 ENCOUNTER — Other Ambulatory Visit: Payer: Self-pay

## 2020-01-07 ENCOUNTER — Encounter: Payer: Self-pay | Admitting: Pulmonary Disease

## 2020-01-07 VITALS — BP 126/78 | HR 69 | Temp 97.0°F | Ht 65.0 in | Wt 128.4 lb

## 2020-01-07 DIAGNOSIS — J432 Centrilobular emphysema: Secondary | ICD-10-CM

## 2020-01-07 NOTE — Patient Instructions (Signed)
Follow up in 6 months 

## 2020-01-07 NOTE — Progress Notes (Signed)
Pulmonary, Critical Care, and Sleep Medicine  Chief Complaint  Patient presents with  . pulmonary consult    ED visit 12/28/2019 for COPDE treated withy doxy. C/o sob with exertion.     Constitutional:  BP 126/78 (BP Location: Left Arm, Cuff Size: Normal)   Pulse 69   Temp (!) 97 F (36.1 C) (Temporal)   Ht 5\' 5"  (1.651 m)   Wt 128 lb 6.4 oz (58.2 kg)   SpO2 96%   BMI 21.37 kg/m   Past Medical History:  Atrial fibrillation, CHP s/p PM, HTN, Renal artery stenosis s/p stent, AAA s/p repair, HLD, Carotid artery disease, Gout, Nephrolithiasis  Past Surgical History:  Her  has a past surgical history that includes ohter; Tonsillectomy; Abdominal aortic aneurysm repair; Hernia repair; PPM GENERATOR CHANGEOUT (N/A, 02/25/2017); RENAL ANGIOGRAPHY (Right, 12/03/2017); Cystoscopy/ureteroscopy/holmium laser/stent placement (Right, 01/01/2019); and Cardioversion (N/A, 02/06/2019).  Brief Summary:  Jessica [name] is a 86 y.o. female former smoker with COPD.      Subjective:   She is here with her son.  She resides in assisted living.  Last seen in 2018 by Dr. Alva Garnet.    She was seen in ER earlier this month for COPD exacerbation.  She was treated with prednisone and antibiotics.  Feels like she is back baseline.  Hasn't needed to use albuterol much over the past few days.  She was previously on spiriva - uncertain why this was stopped.  Feels advair working well.  She has noticed weakness in last two fingers on Lt hand.  This has been present for past 3 days.  Physical Exam:   Appearance - well kempt, thin, sitting in wheelchair  ENMT - no sinus tenderness, no oral exudate, no LAN, Mallampati 2 airway, no stridor  Respiratory - decreased breath sounds bilaterally, no wheezing or rales  CV - irregular rate and rhythm, no murmurs  Ext - no clubbing, no edema  Skin - no rashes  Psych - normal mood and affect  Neuro - Lt 4th and 5th fingers weak   Pulmonary  testing:   Spirometry 09/27/11 >> FEV1 1.0 (44%), FEV1% 53  Cardiac Tests:   Echo 8/0/18 >> mod LVH, EF 60 to 65%, mild LA dilation, PASP 40 to 45 mmHg  Social History:  She  reports that she quit smoking about 24 years ago. Her smoking use included cigarettes. She has a 75.00 pack-year smoking history. She has never used smokeless tobacco. She reports previous alcohol use. She reports that she does not use drugs.  Family History:  Her family history includes Alcohol abuse in her father; Cancer in her brother; Heart disease in her father; Other (age of onset: 2) in her mother.     Assessment/Plan:   COPD with emphysema. - improved from recent exacerbation - continue advair and prn albuterol  Permanent Atrial Fibrillation. - followed by Dr. Rockey Situ with [facility] [place] block s/p pacemaker. - followed by Dr. Caryl Comes with [facility]  Weakness in Lt hand 4th and 5th fingers. - concern she could have ulnar neuropathy - she will discuss with her PCP and then determine if she might need assessment by neurology for nerve conduction studies - discussed how leaning on her left elbow and/or wrist could contribute to pressure on ulnar nerve  Time Spent Involved in Patient Care on Day of Examination:  24 minutes  Follow up:  Patient Instructions  Follow up in 6 months   Medication List:  Allergies as of 01/07/2020      Reactions   Codeine Other (See Comments)   Made her feel crazy   Morphine Other (See Comments)   Made her feel crazy   Zyrtec [cetirizine] Other (See Comments)   Causes facial numbness      Medication List       Accurate as of January 07, 2020  9:37 AM. If you have any questions, ask your nurse or doctor.        acetaminophen 500 MG tablet Commonly known as: TYLENOL Take 500 mg by mouth every 6 (six) hours as needed for moderate pain or headache.   albuterol 108 (90 Base) MCG/ACT inhaler Commonly known as: ProAir HFA Inhale 2 puffs into  the lungs every 6 (six) hours as needed for wheezing or shortness of breath.   amLODipine 5 MG tablet Commonly known as: NORVASC Take 1 tablet (5 mg total) by mouth daily. Please call to schedule office visit for further refills.   apixaban 2.5 MG Tabs tablet Commonly known as: Eliquis Take 1 tablet (2.5 mg total) by mouth 2 (two) times daily.   atenolol 25 MG tablet Commonly known as: TENORMIN Take 1 tablet (25 mg total) by mouth daily. Please call to schedule office visit for further refills.   cholecalciferol 25 MCG (1000 UNIT) tablet Commonly known as: VITAMIN D3 Take 1,000 Units by mouth daily.   doxycycline 100 MG capsule Commonly known as: VIBRAMYCIN Take 1 capsule (100 mg total) by mouth 2 (two) times daily for 10 days.   Fluticasone-Salmeterol 250-50 MCG/DOSE Aepb Commonly known as: Advair Diskus Inhale 1 puff into the lungs in the morning and at bedtime.   furosemide 20 MG tablet Commonly known as: LASIX Take 1 tablet (20 mg total) by mouth as needed (for shortness of breath/edema).   melatonin 3 MG Tabs tablet Take 1 tablet (3 mg) by mouth once daily at bedtime as needed for sleep   rosuvastatin 5 MG tablet Commonly known as: CRESTOR Take 1 tablet (5 mg total) by mouth daily.   senna-docusate 8.6-50 MG tablet Commonly known as: Senokot-S Take 2 tablets by mouth once daily at bedtime as needed for constipation   tamsulosin 0.4 MG Caps capsule Commonly known as: FLOMAX Take 1 capsule (0.4 mg total) by mouth daily.   vitamin B-12 1000 MCG tablet Commonly known as: CYANOCOBALAMIN Take 1 tablet (1000 mg) by mouth once daily at bedtime       Signature:  Chesley Mires, MD [place] Pager - [phone] 01/07/2020, 9:37 AM

## 2020-01-08 NOTE — Progress Notes (Signed)
Remote pacemaker transmission.   

## 2020-01-20 ENCOUNTER — Telehealth: Payer: Self-pay | Admitting: Pulmonary Disease

## 2020-01-20 NOTE — Telephone Encounter (Signed)
Schedule next available appt with [facility] or [facility].  Or can seek evaluation by PCP or EMS if acutely worsens.   Garner Nash, DO [facility] 01/20/2020 3:56 PM

## 2020-01-20 NOTE — Telephone Encounter (Signed)
Spoke with pt, aware of recs.  Scheduled next available OV in [place].  Nothing further needed at this time- will close encounter.

## 2020-01-20 NOTE — Telephone Encounter (Signed)
Spoke with pt, c/o increased sob with any exertion, fatigue.  Denies mucus production, sinus congestion, fever, chest pain.   Pt using Advair BID, using proair q2H.  S/s present X2-3 days.   Pt has received 2 C19 vaccines, flu shot.  Denies any known covid exposures.  Requesting additional recs.    Ph[facility]: [facility] [place].   Sending to Dr. Valeta Harms since Dr. Halford Chessman is not available.  Please advise on recs.  Thanks!

## 2020-01-21 ENCOUNTER — Encounter: Payer: Self-pay | Admitting: Emergency Medicine

## 2020-01-21 ENCOUNTER — Other Ambulatory Visit: Payer: Self-pay

## 2020-01-21 ENCOUNTER — Emergency Department: Payer: Medicare Other

## 2020-01-21 ENCOUNTER — Inpatient Hospital Stay
Admission: EM | Admit: 2020-01-21 | Discharge: 2020-01-26 | DRG: 657 | Disposition: A | Discharge status: Home Health | Payer: Medicare Other | Attending: Internal Medicine | Admitting: Internal Medicine

## 2020-01-21 DIAGNOSIS — R0902 Hypoxemia: Secondary | ICD-10-CM | POA: Diagnosis not present

## 2020-01-21 DIAGNOSIS — C676 Malignant neoplasm of ureteric orifice: Secondary | ICD-10-CM | POA: Diagnosis not present

## 2020-01-21 DIAGNOSIS — N1831 Chronic kidney disease, stage 3a: Secondary | ICD-10-CM | POA: Diagnosis present

## 2020-01-21 DIAGNOSIS — J9811 Atelectasis: Secondary | ICD-10-CM | POA: Diagnosis not present

## 2020-01-21 DIAGNOSIS — F32A Depression, unspecified: Secondary | ICD-10-CM | POA: Diagnosis present

## 2020-01-21 DIAGNOSIS — D631 Anemia in chronic kidney disease: Secondary | ICD-10-CM | POA: Diagnosis present

## 2020-01-21 DIAGNOSIS — E876 Hypokalemia: Secondary | ICD-10-CM

## 2020-01-21 DIAGNOSIS — I129 Hypertensive chronic kidney disease with stage 1 through stage 4 chronic kidney disease, or unspecified chronic kidney disease: Secondary | ICD-10-CM | POA: Diagnosis not present

## 2020-01-21 DIAGNOSIS — J438 Other emphysema: Secondary | ICD-10-CM | POA: Diagnosis not present

## 2020-01-21 DIAGNOSIS — Z66 Do not resuscitate: Secondary | ICD-10-CM | POA: Diagnosis not present

## 2020-01-21 DIAGNOSIS — I442 Atrioventricular block, complete: Secondary | ICD-10-CM | POA: Diagnosis not present

## 2020-01-21 DIAGNOSIS — J441 Chronic obstructive pulmonary disease with (acute) exacerbation: Secondary | ICD-10-CM | POA: Diagnosis not present

## 2020-01-21 DIAGNOSIS — I714 Abdominal aortic aneurysm, without rupture: Secondary | ICD-10-CM | POA: Diagnosis present

## 2020-01-21 DIAGNOSIS — I10 Essential (primary) hypertension: Secondary | ICD-10-CM | POA: Diagnosis present

## 2020-01-21 DIAGNOSIS — Z950 Presence of cardiac pacemaker: Secondary | ICD-10-CM

## 2020-01-21 DIAGNOSIS — E860 Dehydration: Secondary | ICD-10-CM | POA: Diagnosis not present

## 2020-01-21 DIAGNOSIS — Z743 Need for continuous supervision: Secondary | ICD-10-CM | POA: Diagnosis not present

## 2020-01-21 DIAGNOSIS — E785 Hyperlipidemia, unspecified: Secondary | ICD-10-CM | POA: Diagnosis present

## 2020-01-21 DIAGNOSIS — I4891 Unspecified atrial fibrillation: Secondary | ICD-10-CM | POA: Diagnosis not present

## 2020-01-21 DIAGNOSIS — R0602 Shortness of breath: Secondary | ICD-10-CM | POA: Diagnosis not present

## 2020-01-21 DIAGNOSIS — C689 Malignant neoplasm of urinary organ, unspecified: Secondary | ICD-10-CM

## 2020-01-21 DIAGNOSIS — N2889 Other specified disorders of kidney and ureter: Secondary | ICD-10-CM | POA: Diagnosis present

## 2020-01-21 DIAGNOSIS — Z20822 Contact with and (suspected) exposure to covid-19: Secondary | ICD-10-CM | POA: Diagnosis present

## 2020-01-21 DIAGNOSIS — M109 Gout, unspecified: Secondary | ICD-10-CM | POA: Diagnosis present

## 2020-01-21 DIAGNOSIS — N1832 Chronic kidney disease, stage 3b: Secondary | ICD-10-CM | POA: Diagnosis present

## 2020-01-21 DIAGNOSIS — Z885 Allergy status to narcotic agent status: Secondary | ICD-10-CM | POA: Diagnosis not present

## 2020-01-21 DIAGNOSIS — N179 Acute kidney failure, unspecified: Secondary | ICD-10-CM | POA: Diagnosis not present

## 2020-01-21 DIAGNOSIS — J90 Pleural effusion, not elsewhere classified: Secondary | ICD-10-CM | POA: Diagnosis not present

## 2020-01-21 DIAGNOSIS — R059 Cough, unspecified: Secondary | ICD-10-CM | POA: Diagnosis present

## 2020-01-21 DIAGNOSIS — T502X5A Adverse effect of carbonic-anhydrase inhibitors, benzothiadiazides and other diuretics, initial encounter: Secondary | ICD-10-CM | POA: Diagnosis not present

## 2020-01-21 DIAGNOSIS — Z9109 Other allergy status, other than to drugs and biological substances: Secondary | ICD-10-CM

## 2020-01-21 DIAGNOSIS — Z87891 Personal history of nicotine dependence: Secondary | ICD-10-CM

## 2020-01-21 DIAGNOSIS — C674 Malignant neoplasm of posterior wall of bladder: Secondary | ICD-10-CM | POA: Diagnosis not present

## 2020-01-21 DIAGNOSIS — R0600 Dyspnea, unspecified: Secondary | ICD-10-CM | POA: Diagnosis not present

## 2020-01-21 DIAGNOSIS — R7989 Other specified abnormal findings of blood chemistry: Secondary | ICD-10-CM | POA: Diagnosis present

## 2020-01-21 DIAGNOSIS — Z7901 Long term (current) use of anticoagulants: Secondary | ICD-10-CM

## 2020-01-21 DIAGNOSIS — R6889 Other general symptoms and signs: Secondary | ICD-10-CM | POA: Diagnosis not present

## 2020-01-21 DIAGNOSIS — C651 Malignant neoplasm of right renal pelvis: Secondary | ICD-10-CM | POA: Diagnosis not present

## 2020-01-21 DIAGNOSIS — Z809 Family history of malignant neoplasm, unspecified: Secondary | ICD-10-CM

## 2020-01-21 DIAGNOSIS — Z9119 Patient's noncompliance with other medical treatment and regimen: Secondary | ICD-10-CM

## 2020-01-21 DIAGNOSIS — Z8249 Family history of ischemic heart disease and other diseases of the circulatory system: Secondary | ICD-10-CM

## 2020-01-21 DIAGNOSIS — R069 Unspecified abnormalities of breathing: Secondary | ICD-10-CM | POA: Diagnosis not present

## 2020-01-21 DIAGNOSIS — Z79899 Other long term (current) drug therapy: Secondary | ICD-10-CM

## 2020-01-21 DIAGNOSIS — Z811 Family history of alcohol abuse and dependence: Secondary | ICD-10-CM

## 2020-01-21 LAB — CBC
HCT: 33.2 % — ABNORMAL LOW (ref 36.0–46.0)
Hemoglobin: 11.1 g/dL — ABNORMAL LOW (ref 12.0–15.0)
MCH: 30.7 pg (ref 26.0–34.0)
MCHC: 33.4 g/dL (ref 30.0–36.0)
MCV: 91.7 fL (ref 80.0–100.0)
Platelets: 130 10*3/uL — ABNORMAL LOW (ref 150–400)
RBC: 3.62 MIL/uL — ABNORMAL LOW (ref 3.87–5.11)
RDW: 13.5 % (ref 11.5–15.5)
WBC: 5.8 10*3/uL (ref 4.0–10.5)
nRBC: 0 % (ref 0.0–0.2)

## 2020-01-21 LAB — BASIC METABOLIC PANEL
Anion gap: 12 (ref 5–15)
BUN: 59 mg/dL — ABNORMAL HIGH (ref 8–23)
CO2: 21 mmol/L — ABNORMAL LOW (ref 22–32)
Calcium: 8.8 mg/dL — ABNORMAL LOW (ref 8.9–10.3)
Chloride: 108 mmol/L (ref 98–111)
Creatinine, Ser: 3.87 mg/dL — ABNORMAL HIGH (ref 0.44–1.00)
GFR, Estimated: 11 mL/min — ABNORMAL LOW (ref >60)
Glucose, Bld: 114 mg/dL — ABNORMAL HIGH (ref 70–99)
Potassium: 3.3 mmol/L — ABNORMAL LOW (ref 3.5–5.1)
Sodium: 141 mmol/L (ref 135–145)

## 2020-01-21 LAB — URINALYSIS, COMPLETE (UACMP) WITH MICROSCOPIC
Bacteria, UA: NONE SEEN
Bilirubin Urine: NEGATIVE
Glucose, UA: NEGATIVE mg/dL
Hgb urine dipstick: NEGATIVE
Ketones, ur: NEGATIVE mg/dL
Leukocytes,Ua: NEGATIVE
Nitrite: NEGATIVE
Protein, ur: NEGATIVE mg/dL
Specific Gravity, Urine: 1.009 (ref 1.005–1.030)
Squamous Epithelial / HPF: NONE SEEN (ref 0–5)
pH: 5 (ref 5.0–8.0)

## 2020-01-21 LAB — RESP PANEL BY RT-PCR (FLU A&B, COVID) ARPGX2
Influenza A by PCR: NEGATIVE
Influenza B by PCR: NEGATIVE
SARS Coronavirus 2 by RT PCR: NEGATIVE

## 2020-01-21 LAB — CREATININE, URINE, RANDOM: Creatinine, Urine: 70 mg/dL

## 2020-01-21 LAB — MAGNESIUM: Magnesium: 2 mg/dL (ref 1.7–2.4)

## 2020-01-21 LAB — SODIUM, URINE, RANDOM: Sodium, Ur: 52 mmol/L

## 2020-01-21 LAB — TROPONIN I (HIGH SENSITIVITY)
Troponin I (High Sensitivity): 28 ng/L — ABNORMAL HIGH (ref <18)
Troponin I (High Sensitivity): 29 ng/L — ABNORMAL HIGH (ref <18)

## 2020-01-21 LAB — BRAIN NATRIURETIC PEPTIDE: B Natriuretic Peptide: 499.5 pg/mL — ABNORMAL HIGH (ref 0.0–100.0)

## 2020-01-21 MED ORDER — SODIUM CHLORIDE 0.9 % IV SOLN: INTRAVENOUS | Status: DC

## 2020-01-21 MED ORDER — ATENOLOL 25 MG PO TABS
25.0000 mg | ORAL_TABLET | Freq: Every day | ORAL | Status: DC
  Administered 2020-01-22 – 2020-01-26 (×4): 25 mg via ORAL
  Filled 2020-01-21 (×5): qty 1

## 2020-01-21 MED ORDER — DM-GUAIFENESIN ER 30-600 MG PO TB12: 1.0000 | ORAL_TABLET | Freq: Two times a day (BID) | ORAL | Status: DC | PRN

## 2020-01-21 MED ORDER — TAMSULOSIN HCL 0.4 MG PO CAPS
0.4000 mg | ORAL_CAPSULE | Freq: Every day | ORAL | Status: DC
  Administered 2020-01-22 – 2020-01-26 (×4): 0.4 mg via ORAL
  Filled 2020-01-21 (×4): qty 1

## 2020-01-21 MED ORDER — ONDANSETRON HCL 4 MG/2ML IJ SOLN: 4.0000 mg | Freq: Three times a day (TID) | INTRAMUSCULAR | Status: DC | PRN

## 2020-01-21 MED ORDER — MOMETASONE FURO-FORMOTEROL FUM 200-5 MCG/ACT IN AERO
2.0000 | INHALATION_SPRAY | Freq: Two times a day (BID) | RESPIRATORY_TRACT | Status: DC
  Administered 2020-01-21 – 2020-01-26 (×9): 2 via RESPIRATORY_TRACT
  Filled 2020-01-21: qty 8.8

## 2020-01-21 MED ORDER — IPRATROPIUM-ALBUTEROL 0.5-2.5 (3) MG/3ML IN SOLN
3.0000 mL | RESPIRATORY_TRACT | Status: DC
  Administered 2020-01-21 – 2020-01-22 (×4): 3 mL via RESPIRATORY_TRACT
  Filled 2020-01-21 (×4): qty 3

## 2020-01-21 MED ORDER — METHYLPREDNISOLONE SODIUM SUCC 40 MG IJ SOLR
40.0000 mg | Freq: Two times a day (BID) | INTRAMUSCULAR | Status: DC
  Administered 2020-01-21 – 2020-01-22 (×2): 40 mg via INTRAVENOUS
  Filled 2020-01-21 (×2): qty 1

## 2020-01-21 MED ORDER — ACETAMINOPHEN 325 MG PO TABS: 650.0000 mg | ORAL_TABLET | Freq: Four times a day (QID) | ORAL | Status: DC | PRN

## 2020-01-21 MED ORDER — MELATONIN 3 MG PO TABS
3.0000 mg | ORAL_TABLET | Freq: Every evening | ORAL | Status: DC | PRN
  Filled 2020-01-21: qty 1

## 2020-01-21 MED ORDER — SENNOSIDES-DOCUSATE SODIUM 8.6-50 MG PO TABS: 1.0000 | ORAL_TABLET | Freq: Every evening | ORAL | Status: DC | PRN

## 2020-01-21 MED ORDER — HYDRALAZINE HCL 20 MG/ML IJ SOLN: 5.0000 mg | INTRAMUSCULAR | Status: DC | PRN

## 2020-01-21 MED ORDER — ALBUTEROL SULFATE (2.5 MG/3ML) 0.083% IN NEBU
2.5000 mg | INHALATION_SOLUTION | RESPIRATORY_TRACT | Status: DC | PRN
  Administered 2020-01-23 – 2020-01-24 (×2): 2.5 mg via RESPIRATORY_TRACT
  Filled 2020-01-21 (×2): qty 3

## 2020-01-21 MED ORDER — VITAMIN D 25 MCG (1000 UNIT) PO TABS
1000.0000 [IU] | ORAL_TABLET | Freq: Every day | ORAL | Status: DC
  Administered 2020-01-22 – 2020-01-26 (×4): 1000 [IU] via ORAL
  Filled 2020-01-21 (×4): qty 1

## 2020-01-21 MED ORDER — SODIUM CHLORIDE 0.9 % IV BOLUS
500.0000 mL | Freq: Once | INTRAVENOUS | Status: AC
  Administered 2020-01-21: 500 mL via INTRAVENOUS

## 2020-01-21 MED ORDER — VITAMIN B-12 1000 MCG PO TABS
1000.0000 ug | ORAL_TABLET | Freq: Every day | ORAL | Status: DC
  Administered 2020-01-21 – 2020-01-25 (×4): 1000 ug via ORAL
  Filled 2020-01-21 (×4): qty 1

## 2020-01-21 MED ORDER — LORATADINE 10 MG PO TABS
10.0000 mg | ORAL_TABLET | Freq: Every day | ORAL | Status: DC
  Administered 2020-01-22 – 2020-01-26 (×4): 10 mg via ORAL
  Filled 2020-01-21 (×4): qty 1

## 2020-01-21 MED ORDER — ROSUVASTATIN CALCIUM 10 MG PO TABS
5.0000 mg | ORAL_TABLET | Freq: Every day | ORAL | Status: DC
  Administered 2020-01-21 – 2020-01-25 (×4): 5 mg via ORAL
  Filled 2020-01-21 (×4): qty 1

## 2020-01-21 MED ORDER — AMLODIPINE BESYLATE 5 MG PO TABS
5.0000 mg | ORAL_TABLET | Freq: Every day | ORAL | Status: DC
  Administered 2020-01-22 – 2020-01-26 (×4): 5 mg via ORAL
  Filled 2020-01-21 (×4): qty 1

## 2020-01-21 MED ORDER — POTASSIUM CHLORIDE CRYS ER 20 MEQ PO TBCR
40.0000 meq | EXTENDED_RELEASE_TABLET | Freq: Once | ORAL | Status: AC
  Administered 2020-01-21: 40 meq via ORAL
  Filled 2020-01-21: qty 2

## 2020-01-21 MED ORDER — APIXABAN 2.5 MG PO TABS
2.5000 mg | ORAL_TABLET | Freq: Two times a day (BID) | ORAL | Status: DC
  Administered 2020-01-21 – 2020-01-22 (×2): 2.5 mg via ORAL
  Filled 2020-01-21 (×3): qty 1

## 2020-01-21 NOTE — ED Triage Notes (Signed)
Pt comes into the ED via [facility] from her independent living.  Pt c/o SHOB that started yesterday.  Pt does have a h/o COPD.  Pt currently has even and unlabored respirations.  Pt states the [facility] is worse with exertion, but initial saturation levels at 97-98%.

## 2020-01-21 NOTE — ED Provider Notes (Signed)
[facility] Emergency Department Provider Note ____________________________________________   Event Date/Time   First MD Initiated Contact with Patient 01/21/20 1320     (approximate)  I have reviewed the triage vital signs and the nursing notes.   HISTORY  Chief Complaint Shortness of Breath    HPI Jessica [name] is a 86 y.o. female with PMH as noted below including COPD (not O2 dependent) who presents from her assisted living facility with worsening shortness of breath over the last 2 days, mainly associated with exertion although the patient states that she is now short of breath with even minimal exertion such as walking to the bathroom.  She denies any associated cough, chest pain, fever or chills, vomiting or diarrhea or other acute symptoms.  Past Medical History:  Diagnosis Date  . AAA (abdominal aortic aneurysm) ([place])   . Atrioventricular block, complete ([place])   . Cardiac pacemaker in situ   . COPD (chronic obstructive pulmonary disease) ([place])   . Gout   . Hernia   . Hyperlipidemia   . Hypertension   . Kidney stone   . Presence of permanent cardiac pacemaker   . Rectal bleeding     Patient Active Problem List   Diagnosis Date Noted  . Acute renal failure superimposed on stage 3a chronic kidney disease ([place]) 01/21/2020  . Hypokalemia 01/21/2020  . Atypical chest pain 08/25/2019  . Urothelial cancer ([place]) 01/18/2019  . Nephrolithiasis 01/18/2019  . Urinary tract infection symptoms 12/31/2018  . Bleeding hemorrhoids 02/27/2018  . Weight loss 08/30/2017  . Low back pain 08/30/2017  . Depression, major, single episode, mild ([place]) 05/20/2017  . Fall 05/20/2017  . Hypoglycemia 04/04/2017  . Gout 06/05/2016  . AAA (abdominal aortic aneurysm) without rupture ([place]) 04/18/2016  . Loss of sense of smell 12/30/2015  . Memory difficulties 12/30/2015  . Injury of right lower extremity and cellulitis 11/29/2014  . 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 Renal artery stenosis ([place]) 09/15/2014  . 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 CN (constipation) 09/06/2014  . Change in stool caliber 09/06/2014  . Atherosclerosis of aorto-iliac bypass graft ([place]) 08/17/2014  . Shortness of breath 05/12/2014  . Conjunctival hemorrhage of right eye 08/13/2013  . Medicare annual wellness visit, subsequent 04/22/2013  . Allergic rhinitis 11/26/2012  . COPD (chronic obstructive pulmonary disease) ([place]) 09/27/2011  . Hyperlipidemia 02/22/2011  . Long term current use of anticoagulant 04/12/2010  . HYPERTENSION, BENIGN 08/25/2009  . Atrial fibrillation ([place]) 03/07/2009  . Carotid stenosis 08/24/2008  . AV BLOCK, COMPLETE 06/17/2008  . Aneurysm of abdominal vessel ([place]) 06/17/2008  . PACEMAKER-[place] 06/17/2008    Past Surgical History:  Procedure Laterality Date  . ABDOMINAL AORTIC ANEURYSM REPAIR     son denies  . CARDIOVERSION N/A 02/06/2019   Procedure: CARDIOVERSION;  Surgeon: Minna Merritts, MD;  Location: [facility] ORS;  Service: Cardiovascular;  Laterality: N/A;  . CYSTOSCOPY/URETEROSCOPY/HOLMIUM LASER/STENT PLACEMENT Right 01/01/2019   Procedure: CYSTOSCOPY/URETEROSCOPY/HOLMIUM LASER/STENT PLACEMENT;  Surgeon: Billey Co, MD;  Location: [facility] ORS;  Service: Urology;  Laterality: Right;  . HERNIA REPAIR    . ohter     growth on colon surgery  . PPM GENERATOR CHANGEOUT N/A 02/25/2017   Procedure: PPM GENERATOR CHANGEOUT;  Surgeon: Deboraha Sprang, MD;  Location: [place] [facility];  Service: Cardiovascular;  Laterality: N/A;  . RENAL ANGIOGRAPHY Right 12/03/2017   Procedure: RENAL ANGIOGRAPHY;  Surgeon: Katha Cabal, MD;  Location: [place] [facility];  Service: Cardiovascular;  Laterality: Right;  . TONSILLECTOMY      Prior to Admission medications   Medication Sig Start Date End Date Taking? Authorizing Provider  acetaminophen (TYLENOL) 500 MG tablet Take 500 mg by mouth every 6 (six) hours as needed for  moderate pain or headache.    [provider]  albuterol (PROAIR HFA) 108 (90 Base) MCG/ACT inhaler Inhale 2 puffs into the lungs every 6 (six) hours as needed for wheezing or shortness of breath. 02/24/18   Jodelle Green, FNP  amLODipine (NORVASC) 5 MG tablet Take 1 tablet (5 mg total) by mouth daily. Please call to schedule office visit for further refills. 01/30/19   Minna Merritts, MD  apixaban (ELIQUIS) 2.5 MG TABS tablet Take 1 tablet (2.5 mg total) by mouth 2 (two) times daily. 01/06/19   Deboraha Sprang, MD  atenolol (TENORMIN) 25 MG tablet Take 1 tablet (25 mg total) by mouth daily. Please call to schedule office visit for further refills. 01/30/19   Minna Merritts, MD  cholecalciferol 25 MCG (1000 UT) TABS Take 1,000 Units by mouth daily.    [provider]  Fluticasone-Salmeterol (ADVAIR DISKUS) 250-50 MCG/DOSE AEPB Inhale 1 puff into the lungs in the morning and at bedtime. 08/25/19   Leone Haven, MD  furosemide (LASIX) 20 MG tablet Take 1 tablet (20 mg total) by mouth as needed (for shortness of breath/edema). 07/28/19 12/30/19  Loel Dubonnet, NP  melatonin 3 MG TABS tablet Take 1 tablet (3 mg) by mouth once daily at bedtime as needed for sleep    [provider]  rosuvastatin (CRESTOR) 5 MG tablet Take 1 tablet (5 mg total) by mouth daily. 12/31/19   Loel Dubonnet, NP  senna-docusate (SENOKOT-S) 8.6-50 MG tablet Take 2 tablets by mouth once daily at bedtime as needed for constipation    [provider]  tamsulosin (FLOMAX) 0.4 MG CAPS capsule Take 1 capsule (0.4 mg total) by mouth daily. 12/25/18   Harvest Dark, MD  vitamin B-12 (CYANOCOBALAMIN) 1000 MCG tablet Take 1 tablet (1000 mg) by mouth once daily at bedtime    [provider]  citalopram (CELEXA) 10 MG tablet Take 1 tablet (10 mg total) by mouth daily. 10/08/18 11/12/18  Jodelle Green, FNP    Allergies Codeine, Morphine, and Zyrtec [cetirizine]  Family History   Problem Relation Age of Onset  . Heart disease Father   . Alcohol abuse Father   . Cancer Brother   . Other Mother 14       MVA    Social History Social History   Tobacco Use  . Smoking status: Former Smoker    Packs/day: 1.50    Years: 50.00    Pack years: 75.00    Types: Cigarettes    Quit date: 10/03/1995    Years since quitting: 24.3  . Smokeless tobacco: Never Used  Vaping Use  . Vaping Use: Never used  Substance Use Topics  . Alcohol use: Not Currently  . Drug use: No    Review of Systems  Constitutional: No fever. Eyes: No redness. ENT: No sore throat. Cardiovascular: Denies chest pain. Respiratory: Positive for shortness of breath. Gastrointestinal: No vomiting or diarrhea.  Genitourinary: Negative for dysuria.  Musculoskeletal: Negative for back pain. Skin: Negative for rash. Neurological: Negative for headache.   ____________________________________________   PHYSICAL EXAM:  VITAL SIGNS: ED Triage Vitals  Enc Vitals Group     BP 01/21/20 1117 134/72  Pulse Rate 01/21/20 1117 70     Resp 01/21/20 1117 19     Temp 01/21/20 1117 97.6 F (36.4 C)     Temp Source 01/21/20 1117 Oral     SpO2 01/21/20 1117 96 %     Weight 01/21/20 0918 135 lb (61.2 kg)     Height 01/21/20 0918 5\' 7"  (1.702 m)     Head Circumference --      Peak Flow --      Pain Score 01/21/20 0918 0     Pain Loc --      Pain Edu? --      Excl. in [place]? --     Constitutional: Alert and oriented. Well appearing for age and in no acute distress. Eyes: Conjunctivae are normal.  Head: Atraumatic. Nose: No congestion/rhinnorhea. Mouth/Throat: Mucous membranes are moist.   Neck: Normal range of motion.  Cardiovascular: Normal rate, regular rhythm. Grossly normal heart sounds.  Good peripheral circulation. Respiratory: Normal respiratory effort.  No retractions.  Slightly coarse breath sounds with scattered wheezes. Gastrointestinal: Soft and nontender. No distention.   Genitourinary: No flank tenderness. Musculoskeletal: No lower extremity edema.  Extremities warm and well perfused.  Neurologic:  Normal speech and language. No gross focal neurologic deficits are appreciated.  Skin:  Skin is warm and dry. No rash noted. Psychiatric: Mood and affect are normal. Speech and behavior are normal.  ____________________________________________   LABS (all labs ordered are listed, but only abnormal results are displayed)  Labs Reviewed  BASIC METABOLIC PANEL - Abnormal; Notable for the following components:      Result Value   Potassium 3.3 (*)    CO2 21 (*)    Glucose, Bld 114 (*)    BUN 59 (*)    Creatinine, Ser 3.87 (*)    Calcium 8.8 (*)    GFR, Estimated 11 (*)    All other components within normal limits  CBC - Abnormal; Notable for the following components:   RBC 3.62 (*)    Hemoglobin 11.1 (*)    HCT 33.2 (*)    Platelets 130 (*)    All other components within normal limits  BRAIN NATRIURETIC PEPTIDE - Abnormal; Notable for the following components:   B Natriuretic Peptide 499.5 (*)    All other components within normal limits  URINALYSIS, COMPLETE (UACMP) WITH MICROSCOPIC - Abnormal; Notable for the following components:   Color, Urine STRAW (*)    APPearance CLEAR (*)    All other components within normal limits  TROPONIN I (HIGH SENSITIVITY) - Abnormal; Notable for the following components:   Troponin I (High Sensitivity) 28 (*)    All other components within normal limits  RESP PANEL BY RT-PCR (FLU A&B, COVID) ARPGX2  SODIUM, URINE, RANDOM  CREATININE, URINE, RANDOM  UREA NITROGEN, URINE  TROPONIN I (HIGH SENSITIVITY)   ____________________________________________  EKG  ED ECG REPORT I, Arta Silence, the attending physician, personally viewed and interpreted this ECG.  Date: 01/21/2020 EKG Time: 0923 Rate: 70 Rhythm: Paced rhythm ST/T Wave abnormalities: normal Narrative Interpretation: no evidence of acute  ischemia  ____________________________________________  RADIOLOGY  Chest x-ray interpreted by me shows minimal right pleural effusion with no focal infiltrate or edema  ____________________________________________   PROCEDURES  Procedure(s) performed: No  Procedures  Critical Care performed: No ____________________________________________   INITIAL IMPRESSION / ASSESSMENT AND PLAN / ED COURSE  Pertinent labs & imaging results that were available during my care of the patient were reviewed by me and  considered in my medical decision making (see chart for details).  87 year old female with PMH as noted above including history of COPD presents with worsening shortness of breath over the last 2 days.  I reviewed the past medical records in [place].  The patient was most recently seen with a similar episode on 12/6.  Her work-up and vital signs were reassuring in the ED and she was stable for discharge with a steroid course.  On exam today the patient is overall very well-appearing for her age.  O2 saturation is in the high 90s on room air.  Her other vital signs are normal.  She has no respiratory distress or increased work of breathing.  Overall presentation is consistent with COPD.  However, the initial lab work-up reveals a creatinine of 3.87 which is a significant increase over her baseline around 1.3-1.5.  I have added on additional lab work-up, urinalysis, and we will plan for admission for further work-up and treatment of AKI.  ----------------------------------------- 3:09 PM on 01/21/2020 -----------------------------------------  I discussed the case with Dr. Blaine Hamper from the hospitalist service for admission.   ____________________________________________   FINAL CLINICAL IMPRESSION(S) / ED DIAGNOSES  Final diagnoses:  Acute kidney injury ([place])      NEW MEDICATIONS STARTED DURING THIS VISIT:  New Prescriptions   No medications on file     Note:  This document  was prepared using Dragon voice recognition software and may include unintentional dictation errors.    Arta Silence, MD 01/21/20 [phone]

## 2020-01-21 NOTE — ED Triage Notes (Signed)
First RN Note: pt to ED via [facility] with c/o SOB from independent living. Per EMS pt with hx of COPD, saw pulmonologist less than 1 week ago. Per EMS pt with c/o SOB with movement. Per EMS pt with 98% on RA, 97-98% with movement. Per EMS pt anxious due to SOB with movement. Per EMS VSS, lung sounds clear to auscultation.   97.4 153/60 70HR 98% RA

## 2020-01-21 NOTE — ED Notes (Signed)
Pt presents to ED with c/o of SOB that has been ongoing for 2 days. Pt states SOB has been increasing over those few days and pt states SOB is more severe during exertion. Pt has a HX of COPD. Pt states taking inhaler with no relief at home. Pt is currently on RA maintaining a O2 sat above 95%. Pt denies wearing O2 at home. Pt has exp wheezes. NAD noted.

## 2020-01-21 NOTE — H&P (Signed)
History and Physical    Jessica [name] CVE:[id number] DOB: 1929/09/09 DOA: 01/21/2020  Referring MD/NP/PA:   PCP: [facility]   Patient coming from:  The patient is coming from ALF.  At baseline, pt is dependent for most of ADL.        Chief Complaint: Shortness breath  HPI: Jessica [name] is a 86 y.o. female with medical history significant of hypertension, hyperlipidemia, COPD, gout, depression, rectal bleeding, pacemaker placement, atrial fibrillation on Eliquis, urothelial cancer, CKD-stage III, who presents with shortness of breath.  Per her son (I called her son by phone), patient states that she has been having shortness of breath for 1 week, which has worsened today. Pt denies chest pain. Patient has dry cough, no fever or chills.  Denies nausea, vomiting, diarrhea, abdominal pain, symptoms of UTI or unilateral weakness.  ED Course: pt was found to have WBC 5.8, BNP 499, negative Covid PCR, potassium 3.3, worsening renal function with creatinine up from baseline 1.3 and to 3.87, BUN 59, temperature normal, blood pressure 134/72, heart rate is 70, RR 19, oxygen saturation 96% on room air. Chest x-ray showed minimal bilateral basilar atelectasis and minimal right pleural effusion. Patient is placed on MedSurg bed for observation.  Dr. Holley Raring of nephrology is consulted   Review of Systems:   General: no fevers, chills, no body weight gain, has fatigue HEENT: no blurry vision, hearing changes or sore throat Respiratory: has dyspnea, coughing, wheezing CV: no chest pain, no palpitations GI: no nausea, vomiting, abdominal pain, diarrhea, constipation GU: no dysuria, burning on urination, increased urinary frequency, hematuria  Ext: no leg edema Neuro: no unilateral weakness, numbness, or tingling, no vision change or hearing loss Skin: no rash, no skin tear. MSK: No muscle spasm, no deformity, no limitation of range of movement in spin Heme: No easy bruising.   Travel history: No recent long distant travel.  Allergy:  Allergies  Allergen Reactions  . Codeine Other (See Comments)    Made her feel crazy  . Morphine Other (See Comments)    Made her feel crazy  . Zyrtec [Cetirizine] Other (See Comments)    Causes facial numbness    Past Medical History:  Diagnosis Date  . AAA (abdominal aortic aneurysm) ([place])   . Atrioventricular block, complete ([place])   . Cardiac pacemaker in situ   . COPD (chronic obstructive pulmonary disease) ([place])   . Gout   . Hernia   . Hyperlipidemia   . Hypertension   . Kidney stone   . Presence of permanent cardiac pacemaker   . Rectal bleeding     Past Surgical History:  Procedure Laterality Date  . ABDOMINAL AORTIC ANEURYSM REPAIR     son denies  . CARDIOVERSION N/A 02/06/2019   Procedure: CARDIOVERSION;  Surgeon: Minna Merritts, MD;  Location: [facility] ORS;  Service: Cardiovascular;  Laterality: N/A;  . CYSTOSCOPY/URETEROSCOPY/HOLMIUM LASER/STENT PLACEMENT Right 01/01/2019   Procedure: CYSTOSCOPY/URETEROSCOPY/HOLMIUM LASER/STENT PLACEMENT;  Surgeon: Billey Co, MD;  Location: [facility] ORS;  Service: Urology;  Laterality: Right;  . HERNIA REPAIR    . ohter     growth on colon surgery  . PPM GENERATOR CHANGEOUT N/A 02/25/2017   Procedure: PPM GENERATOR CHANGEOUT;  Surgeon: Deboraha Sprang, MD;  Location: [place] [facility];  Service: Cardiovascular;  Laterality: N/A;  . RENAL ANGIOGRAPHY Right 12/03/2017   Procedure: RENAL ANGIOGRAPHY;  Surgeon: Katha Cabal, MD;  Location: [place] [facility];  Service: Cardiovascular;  Laterality:  Right;  Marland Kitchen TONSILLECTOMY      Social History:  reports that she quit smoking about 24 years ago. Her smoking use included cigarettes. She has a 75.00 pack-year smoking history. She has never used smokeless tobacco. She reports previous alcohol use. She reports that she does not use drugs.  Family History:  Family History  Problem Relation Age of Onset  . Heart  disease Father   . Alcohol abuse Father   . Cancer Brother   . Other Mother 108       MVA     Prior to Admission medications   Medication Sig Start Date End Date Taking? Authorizing Provider  acetaminophen (TYLENOL) 500 MG tablet Take 500 mg by mouth every 6 (six) hours as needed for moderate pain or headache.    [provider]  albuterol (PROAIR HFA) 108 (90 Base) MCG/ACT inhaler Inhale 2 puffs into the lungs every 6 (six) hours as needed for wheezing or shortness of breath. 02/24/18   Jodelle Green, FNP  amLODipine (NORVASC) 5 MG tablet Take 1 tablet (5 mg total) by mouth daily. Please call to schedule office visit for further refills. 01/30/19   Minna Merritts, MD  apixaban (ELIQUIS) 2.5 MG TABS tablet Take 1 tablet (2.5 mg total) by mouth 2 (two) times daily. 01/06/19   Deboraha Sprang, MD  atenolol (TENORMIN) 25 MG tablet Take 1 tablet (25 mg total) by mouth daily. Please call to schedule office visit for further refills. 01/30/19   Minna Merritts, MD  cholecalciferol 25 MCG (1000 UT) TABS Take 1,000 Units by mouth daily.    [provider]  Fluticasone-Salmeterol (ADVAIR DISKUS) 250-50 MCG/DOSE AEPB Inhale 1 puff into the lungs in the morning and at bedtime. 08/25/19   Leone Haven, MD  furosemide (LASIX) 20 MG tablet Take 1 tablet (20 mg total) by mouth as needed (for shortness of breath/edema). 07/28/19 12/30/19  Loel Dubonnet, NP  melatonin 3 MG TABS tablet Take 1 tablet (3 mg) by mouth once daily at bedtime as needed for sleep    [provider]  rosuvastatin (CRESTOR) 5 MG tablet Take 1 tablet (5 mg total) by mouth daily. 12/31/19   Loel Dubonnet, NP  senna-docusate (SENOKOT-S) 8.6-50 MG tablet Take 2 tablets by mouth once daily at bedtime as needed for constipation    [provider]  tamsulosin (FLOMAX) 0.4 MG CAPS capsule Take 1 capsule (0.4 mg total) by mouth daily. 12/25/18   Harvest Dark, MD  vitamin B-12 (CYANOCOBALAMIN) 1000  MCG tablet Take 1 tablet (1000 mg) by mouth once daily at bedtime    [provider]  citalopram (CELEXA) 10 MG tablet Take 1 tablet (10 mg total) by mouth daily. 10/08/18 11/12/18  Jodelle Green, FNP    Physical Exam: Vitals:   01/21/20 0918 01/21/20 1117 01/21/20 1600  BP:  134/72 (!) 143/74  Pulse:  70 69  Resp:  19 (!) 21  Temp:  97.6 F (36.4 C)   TempSrc:  Oral   SpO2:  96% 97%  Weight: 61.2 kg    Height: 5\' 7"  (1.702 m)     General: Not in acute distress HEENT:       Eyes: PERRL, EOMI, no scleral icterus.       ENT: No discharge from the ears and nose, no pharynx injection, no tonsillar enlargement.        Neck: No JVD, no bruit, no mass felt. Heme: No neck lymph  node enlargement. Cardiac: S1/S2, RRR, No murmurs, No gallops or rubs. Respiratory: Has mild wheezing on right side posteriorly GI: Soft, nondistended, nontender, no rebound pain, no organomegaly, BS present. GU: No hematuria Ext: No pitting leg edema bilaterally. 2+DP/PT pulse bilaterally. Musculoskeletal: No joint deformities, No joint redness or warmth, no limitation of ROM in spin. Skin: No rashes.  Neuro: Alert, oriented X3, cranial nerves II-XII grossly intact, moves all extremities. Psych: Patient is not psychotic, no suicidal or hemocidal ideation.  Labs on Admission: I have personally reviewed following labs and imaging studies  CBC: Recent Labs  Lab 01/21/20 0843  WBC 5.8  HGB 11.1*  HCT 33.2*  MCV 91.7  PLT 923*   Basic Metabolic Panel: Recent Labs  Lab 01/21/20 0843 01/21/20 1404  NA 141  --   K 3.3*  --   CL 108  --   CO2 21*  --   GLUCOSE 114*  --   BUN 59*  --   CREATININE 3.87*  --   CALCIUM 8.8*  --   MG  --  2.0   GFR: Estimated Creatinine Clearance: 9.7 mL/min (A) (by C-G formula based on SCr of 3.87 mg/dL (H)). Liver Function Tests: No results for input(s): AST, ALT, ALKPHOS, BILITOT, PROT, ALBUMIN in the last 168 hours. No results for input(s): LIPASE,  AMYLASE in the last 168 hours. No results for input(s): AMMONIA in the last 168 hours. Coagulation Profile: No results for input(s): INR, PROTIME in the last 168 hours. Cardiac Enzymes: No results for input(s): CKTOTAL, CKMB, CKMBINDEX, TROPONINI in the last 168 hours. BNP (last 3 results) No results for input(s): PROBNP in the last 8760 hours. HbA1C: No results for input(s): HGBA1C in the last 72 hours. CBG: No results for input(s): GLUCAP in the last 168 hours. Lipid Profile: No results for input(s): CHOL, HDL, LDLCALC, TRIG, CHOLHDL, LDLDIRECT in the last 72 hours. Thyroid Function Tests: No results for input(s): TSH, T4TOTAL, FREET4, T3FREE, THYROIDAB in the last 72 hours. Anemia Panel: No results for input(s): VITAMINB12, FOLATE, FERRITIN, TIBC, IRON, RETICCTPCT in the last 72 hours. Urine analysis:    Component Value Date/Time   COLORURINE STRAW (A) 01/21/2020 1405   APPEARANCEUR CLEAR (A) 01/21/2020 1405   APPEARANCEUR Cloudy (A) 01/19/2019 1143   LABSPEC 1.009 01/21/2020 1405   PHURINE 5.0 01/21/2020 1405   GLUCOSEU NEGATIVE 01/21/2020 1405   GLUCOSEU NEGATIVE 03/06/2017 0930   HGBUR NEGATIVE 01/21/2020 [place] 01/21/2020 1405   BILIRUBINUR Negative 01/19/2019 [place] 01/21/2020 1405   PROTEINUR NEGATIVE 01/21/2020 1405   UROBILINOGEN 0.2 08/30/2017 1108   UROBILINOGEN 0.2 03/06/2017 0930   NITRITE NEGATIVE 01/21/2020 1405   LEUKOCYTESUR NEGATIVE 01/21/2020 1405   Sepsis Labs: @LABRCNTIP (procalcitonin:4,lacticidven:4) ) Recent Results (from the past 240 hour(s))  Resp Panel by RT-PCR (Flu A&B, Covid) Nasopharyngeal Swab     Status: None   Collection Time: 01/21/20  2:05 PM   Specimen: Nasopharyngeal Swab; Nasopharyngeal(NP) swabs in vial transport medium  Result Value Ref Range Status   SARS Coronavirus 2 by RT PCR NEGATIVE NEGATIVE Final    Comment: (NOTE) SARS-CoV-2 target nucleic acids are NOT DETECTED.  The SARS-CoV-2  RNA is generally detectable in upper respiratory specimens during the acute phase of infection. The lowest concentration of SARS-CoV-2 viral copies this assay can detect is 138 copies/mL. A negative result does not preclude SARS-Cov-2 infection and should not be used as the sole basis for treatment or other patient management decisions.  A negative result may occur with  improper specimen collection/handling, submission of specimen other than nasopharyngeal swab, presence of viral mutation(s) within the areas targeted by this assay, and inadequate number of viral copies(<138 copies/mL). A negative result must be combined with clinical observations, patient history, and epidemiological information. The expected result is Negative.  Fact Sheet for Patients:  EntrepreneurPulse.com.au  Fact Sheet for Healthcare Providers:  IncredibleEmployment.be  This test is no t yet approved or cleared by the [place] FDA and  has been authorized for detection and/or diagnosis of SARS-CoV-2 by FDA under an Emergency Use Authorization (EUA). This EUA will remain  in effect (meaning this test can be used) for the duration of the COVID-19 declaration under Section 564(b)(1) of the Act, 21 U.S.C.section 360bbb-3(b)(1), unless the authorization is terminated  or revoked sooner.       Influenza A by PCR NEGATIVE NEGATIVE Final   Influenza B by PCR NEGATIVE NEGATIVE Final    Comment: (NOTE) The Xpert Xpress SARS-CoV-2/FLU/RSV plus assay is intended as an aid in the diagnosis of influenza from Nasopharyngeal swab specimens and should not be used as a sole basis for treatment. Nasal washings and aspirates are unacceptable for Xpert Xpress SARS-CoV-2/FLU/RSV testing.  Fact Sheet for Patients: EntrepreneurPulse.com.au  Fact Sheet for Healthcare Providers: IncredibleEmployment.be  This test is not yet approved or cleared by the  [place] FDA and has been authorized for detection and/or diagnosis of SARS-CoV-2 by FDA under an Emergency Use Authorization (EUA). This EUA will remain in effect (meaning this test can be used) for the duration of the COVID-19 declaration under Section 564(b)(1) of the Act, 21 U.S.C. section 360bbb-3(b)(1), unless the authorization is terminated or revoked.  Performed at [facility], [address] [place], [place] [address]      Radiological Exams on Admission: DG Chest 2 View  Result Date: 01/21/2020 CLINICAL DATA:  Dyspnea with exertion. EXAM: CHEST - 2 VIEW COMPARISON:  December 28, 2019. FINDINGS: Stable cardiomediastinal silhouette. Left-sided pacemaker is unchanged in position. No pneumothorax or pleural effusion is noted. Minimal bibasilar subsegmental atelectasis is noted. Minimal right pleural effusion is noted. Bony thorax is unremarkable. IMPRESSION: Minimal bibasilar subsegmental atelectasis. Minimal right pleural effusion. Aortic Atherosclerosis (ICD10-I70.0). Electronically Signed   By: Marijo Conception M.D.   On: 01/21/2020 09:45     EKG: I have personally reviewed.  Seem to be paced rhythm, QTC 470  Assessment/Plan Principal Problem:   Acute renal failure superimposed on stage 3a chronic kidney disease (HCC) Active Problems:   HYPERTENSION, BENIGN   Atrial fibrillation (HCC)   Hyperlipidemia   COPD exacerbation (HCC)   Hypokalemia   Acute renal failure superimposed on stage 3a chronic kidney disease ([place]): Baseline Cre is 1.3, pt's Cre is 3.87 and BUN 59 on admission.  Etiology is not clear. Likely due to dehydration and continuation of diuretics. Dr. Holley Raring of renal is consulted  -Place in med-surg bed for obs - IVF: 500 cc of NS in ED -->will continue 75 cc/h - Follow up renal function by BMP - Avoid using renal toxic medications, hypotension and contrast dye (or carefully use) - Check FeUrea - Hold lasix - US-renal  COPD exacerbation  [facility]): Patient has short of breath, mild wheezing on right side of the lung, indicating COPD exacerbation.  No oxygen desaturation. -Bronchodilators -Solu-Medrol 40 mg IV bid -Mucinex for cough  -Incentive spirometry - prn Nasal cannula oxygen as needed to maintain O2 saturation 93% or greater  HYPERTENSION, BENIGN -  IV hydralazine as needed -Continue amlodipine, atenolol  Atrial fibrillation (HCC) -Continue Eliquis and atenolol  Hyperlipidemia -Crestor  Hypokalemia: K= 3.3 on admission. - Repleted - Check Mg level   DVT ppx: Eliquis Code Status: DNR per her son who is POA Family Communication: n Yes, patient's son by phone Disposition Plan:  Anticipate discharge back to previous ALF environment Consults called:  Dr. Holley Raring of renal Admission status: Med-surg bed for obs  Status is: Observation  The patient remains OBS appropriate and will d/c before 2 midnights.  Dispo: The patient is from: ALF              Anticipated d/c is to: ALF              Anticipated d/c date is: 1 day              Patient currently is not medically stable to d/c.          Date of Service 01/21/2020    Ivor Costa [facility]   If 7PM-7AM, please contact night-coverage [email or web address] 01/21/2020, 6:06 PM

## 2020-01-21 NOTE — ED Notes (Signed)
Diet tray ordered from dietary this RN spoke to [employer]

## 2020-01-22 ENCOUNTER — Observation Stay: Payer: Medicare Other

## 2020-01-22 ENCOUNTER — Inpatient Hospital Stay: Payer: Medicare Other

## 2020-01-22 DIAGNOSIS — N139 Obstructive and reflux uropathy, unspecified: Secondary | ICD-10-CM | POA: Diagnosis not present

## 2020-01-22 DIAGNOSIS — N200 Calculus of kidney: Secondary | ICD-10-CM | POA: Diagnosis not present

## 2020-01-22 DIAGNOSIS — M109 Gout, unspecified: Secondary | ICD-10-CM | POA: Diagnosis not present

## 2020-01-22 DIAGNOSIS — R0902 Hypoxemia: Secondary | ICD-10-CM | POA: Diagnosis not present

## 2020-01-22 DIAGNOSIS — C676 Malignant neoplasm of ureteric orifice: Secondary | ICD-10-CM | POA: Diagnosis present

## 2020-01-22 DIAGNOSIS — Z7901 Long term (current) use of anticoagulants: Secondary | ICD-10-CM | POA: Diagnosis not present

## 2020-01-22 DIAGNOSIS — R0789 Other chest pain: Secondary | ICD-10-CM | POA: Diagnosis not present

## 2020-01-22 DIAGNOSIS — N1832 Chronic kidney disease, stage 3b: Secondary | ICD-10-CM | POA: Diagnosis not present

## 2020-01-22 DIAGNOSIS — C661 Malignant neoplasm of right ureter: Secondary | ICD-10-CM | POA: Diagnosis not present

## 2020-01-22 DIAGNOSIS — N179 Acute kidney failure, unspecified: Secondary | ICD-10-CM | POA: Diagnosis not present

## 2020-01-22 DIAGNOSIS — I714 Abdominal aortic aneurysm, without rupture: Secondary | ICD-10-CM | POA: Diagnosis present

## 2020-01-22 DIAGNOSIS — N2889 Other specified disorders of kidney and ureter: Secondary | ICD-10-CM | POA: Diagnosis present

## 2020-01-22 DIAGNOSIS — D631 Anemia in chronic kidney disease: Secondary | ICD-10-CM | POA: Diagnosis not present

## 2020-01-22 DIAGNOSIS — R0602 Shortness of breath: Secondary | ICD-10-CM | POA: Diagnosis not present

## 2020-01-22 DIAGNOSIS — Z66 Do not resuscitate: Secondary | ICD-10-CM | POA: Diagnosis present

## 2020-01-22 DIAGNOSIS — N1831 Chronic kidney disease, stage 3a: Secondary | ICD-10-CM

## 2020-01-22 DIAGNOSIS — C689 Malignant neoplasm of urinary organ, unspecified: Secondary | ICD-10-CM | POA: Diagnosis not present

## 2020-01-22 DIAGNOSIS — N1330 Unspecified hydronephrosis: Secondary | ICD-10-CM | POA: Diagnosis not present

## 2020-01-22 DIAGNOSIS — Z20822 Contact with and (suspected) exposure to covid-19: Secondary | ICD-10-CM | POA: Diagnosis present

## 2020-01-22 DIAGNOSIS — C651 Malignant neoplasm of right renal pelvis: Secondary | ICD-10-CM | POA: Diagnosis present

## 2020-01-22 DIAGNOSIS — Z9109 Other allergy status, other than to drugs and biological substances: Secondary | ICD-10-CM | POA: Diagnosis not present

## 2020-01-22 DIAGNOSIS — E876 Hypokalemia: Secondary | ICD-10-CM | POA: Diagnosis not present

## 2020-01-22 DIAGNOSIS — I4891 Unspecified atrial fibrillation: Secondary | ICD-10-CM | POA: Diagnosis not present

## 2020-01-22 DIAGNOSIS — Z885 Allergy status to narcotic agent status: Secondary | ICD-10-CM | POA: Diagnosis not present

## 2020-01-22 DIAGNOSIS — I129 Hypertensive chronic kidney disease with stage 1 through stage 4 chronic kidney disease, or unspecified chronic kidney disease: Secondary | ICD-10-CM | POA: Diagnosis not present

## 2020-01-22 DIAGNOSIS — J441 Chronic obstructive pulmonary disease with (acute) exacerbation: Secondary | ICD-10-CM

## 2020-01-22 DIAGNOSIS — K7460 Unspecified cirrhosis of liver: Secondary | ICD-10-CM | POA: Diagnosis not present

## 2020-01-22 DIAGNOSIS — I10 Essential (primary) hypertension: Secondary | ICD-10-CM | POA: Diagnosis not present

## 2020-01-22 DIAGNOSIS — Z950 Presence of cardiac pacemaker: Secondary | ICD-10-CM | POA: Diagnosis not present

## 2020-01-22 DIAGNOSIS — E860 Dehydration: Secondary | ICD-10-CM | POA: Diagnosis present

## 2020-01-22 DIAGNOSIS — C674 Malignant neoplasm of posterior wall of bladder: Secondary | ICD-10-CM | POA: Diagnosis not present

## 2020-01-22 DIAGNOSIS — N202 Calculus of kidney with calculus of ureter: Secondary | ICD-10-CM | POA: Diagnosis not present

## 2020-01-22 DIAGNOSIS — R059 Cough, unspecified: Secondary | ICD-10-CM | POA: Diagnosis present

## 2020-01-22 DIAGNOSIS — E785 Hyperlipidemia, unspecified: Secondary | ICD-10-CM

## 2020-01-22 DIAGNOSIS — I442 Atrioventricular block, complete: Secondary | ICD-10-CM | POA: Diagnosis present

## 2020-01-22 DIAGNOSIS — T502X5A Adverse effect of carbonic-anhydrase inhibitors, benzothiadiazides and other diuretics, initial encounter: Secondary | ICD-10-CM | POA: Diagnosis present

## 2020-01-22 DIAGNOSIS — N281 Cyst of kidney, acquired: Secondary | ICD-10-CM | POA: Diagnosis not present

## 2020-01-22 LAB — BASIC METABOLIC PANEL
Anion gap: 8 (ref 5–15)
BUN: 61 mg/dL — ABNORMAL HIGH (ref 8–23)
CO2: 19 mmol/L — ABNORMAL LOW (ref 22–32)
Calcium: 8.2 mg/dL — ABNORMAL LOW (ref 8.9–10.3)
Chloride: 115 mmol/L — ABNORMAL HIGH (ref 98–111)
Creatinine, Ser: 3.48 mg/dL — ABNORMAL HIGH (ref 0.44–1.00)
GFR, Estimated: 12 mL/min — ABNORMAL LOW (ref >60)
Glucose, Bld: 135 mg/dL — ABNORMAL HIGH (ref 70–99)
Potassium: 4.2 mmol/L (ref 3.5–5.1)
Sodium: 142 mmol/L (ref 135–145)

## 2020-01-22 LAB — CBC
HCT: 30.7 % — ABNORMAL LOW (ref 36.0–46.0)
Hemoglobin: 10.4 g/dL — ABNORMAL LOW (ref 12.0–15.0)
MCH: 30.9 pg (ref 26.0–34.0)
MCHC: 33.9 g/dL (ref 30.0–36.0)
MCV: 91.1 fL (ref 80.0–100.0)
Platelets: 133 10*3/uL — ABNORMAL LOW (ref 150–400)
RBC: 3.37 MIL/uL — ABNORMAL LOW (ref 3.87–5.11)
RDW: 13.4 % (ref 11.5–15.5)
WBC: 2.9 10*3/uL — ABNORMAL LOW (ref 4.0–10.5)
nRBC: 0 % (ref 0.0–0.2)

## 2020-01-22 LAB — UREA NITROGEN, URINE: Urea Nitrogen, Ur: 374 mg/dL

## 2020-01-22 MED ORDER — IPRATROPIUM-ALBUTEROL 0.5-2.5 (3) MG/3ML IN SOLN
3.0000 mL | Freq: Three times a day (TID) | RESPIRATORY_TRACT | Status: DC
  Administered 2020-01-22 – 2020-01-23 (×3): 3 mL via RESPIRATORY_TRACT
  Filled 2020-01-22 (×3): qty 3

## 2020-01-22 MED ORDER — METHYLPREDNISOLONE SODIUM SUCC 40 MG IJ SOLR: 40.0000 mg | Freq: Every day | INTRAMUSCULAR | Status: DC

## 2020-01-22 NOTE — Progress Notes (Signed)
[place] [facility] [place] NAME: Jessica [name]    MR#:  [id number]  DATE OF BIRTH:  [birth year]  SUBJECTIVE:   Patient came in with increasing shortness of breath and generalized weakness. She was found to have elevated creatinine of 3.48. Baseline creatinine 1.5.  Today feels better. She is on room air 93 to 94%. Son in the room.  REVIEW OF SYSTEMS:   Review of Systems  Constitutional: Negative for chills, fever and weight loss.  HENT: Negative for ear discharge, ear pain and nosebleeds.   Eyes: Negative for blurred vision, pain and discharge.  Respiratory: Positive for shortness of breath. Negative for sputum production, wheezing and stridor.   Cardiovascular: Negative for chest pain, palpitations, orthopnea and PND.  Gastrointestinal: Negative for abdominal pain, diarrhea, nausea and vomiting.  Genitourinary: Negative for frequency and urgency.  Musculoskeletal: Negative for back pain and joint pain.  Neurological: Positive for weakness. Negative for sensory change, speech change and focal weakness.  Psychiatric/Behavioral: Negative for depression and hallucinations. The patient is not nervous/anxious.    Tolerating Diet:yes Tolerating PT:   DRUG ALLERGIES:   Allergies  Allergen Reactions  . Codeine Other (See Comments)    Made her feel crazy  . Morphine Other (See Comments)    Made her feel crazy  . Zyrtec [Cetirizine] Other (See Comments)    Causes facial numbness    VITALS:  Blood pressure (!) 109/50, pulse 70, temperature 98.1 F (36.7 C), temperature source Oral, resp. rate 18, height 5\' 7"  (1.702 m), weight 61.2 kg, SpO2 94 %.  PHYSICAL EXAMINATION:   Physical Exam  GENERAL:  86 y.o.-year-old patient lying in the bed with no acute distress.   LUNGS:decreased breath sounds bilaterally, no wheezing, rales, rhonchi. No use of accessory muscles of respiration.  CARDIOVASCULAR: S1, S2 normal. No murmurs, rubs, or gallops.   ABDOMEN: Soft, nontender, nondistended. Bowel sounds present. No organomegaly or mass.  EXTREMITIES: No cyanosis, clubbing or edema b/l.    NEUROLOGIC: Cranial nerves II through XII are intact. No focal Motor or sensory deficits b/l.   PSYCHIATRIC:  patient is alert and oriented x 3.  SKIN: No obvious rash, lesion, or ulcer.   LABORATORY PANEL:  CBC Recent Labs  Lab 01/22/20 0609  WBC 2.9*  HGB 10.4*  HCT 30.7*  PLT 133*    Chemistries  Recent Labs  Lab 01/21/20 1404 01/22/20 0609  NA  --  142  K  --  4.2  CL  --  115*  CO2  --  19*  GLUCOSE  --  135*  BUN  --  61*  CREATININE  --  3.48*  CALCIUM  --  8.2*  MG 2.0  --    Cardiac Enzymes No results for input(s): TROPONINI in the last 168 hours. RADIOLOGY:  DG Chest 2 View  Result Date: 01/21/2020 CLINICAL DATA:  Dyspnea with exertion. EXAM: CHEST - 2 VIEW COMPARISON:  December 28, 2019. FINDINGS: Stable cardiomediastinal silhouette. Left-sided pacemaker is unchanged in position. No pneumothorax or pleural effusion is noted. Minimal bibasilar subsegmental atelectasis is noted. Minimal right pleural effusion is noted. Bony thorax is unremarkable. IMPRESSION: Minimal bibasilar subsegmental atelectasis. Minimal right pleural effusion. Aortic Atherosclerosis (ICD10-I70.0). Electronically Signed   By: Marijo Conception M.D.   On: 01/21/2020 09:45   US RENAL  Result Date: 01/22/2020 CLINICAL DATA:  Acute kidney injury EXAM: RENAL / URINARY TRACT ULTRASOUND COMPLETE COMPARISON:  CT abdomen dated 12/25/2018 FINDINGS: Right  Kidney: Renal measurements: 12.1 x 7.3 x 5.9 cm = volume: 274 mL. Cortical echogenicity is within normal limits. Multiple benign cysts. No suspicious mass is seen. Mild hydronephrosis. Left Kidney: Renal measurements: 7 x 3.9 x 3.9 cm = volume: 57 mL. Echogenic renal cortex. Multiple benign cysts. No suspicious mass is seen. No hydronephrosis. Bladder: Mass along the posterior bladder wall measures 2.1 cm, with  internal vascularity, likely neoplastic. Bladder is otherwise unremarkable. No bladder stone is seen. Other: None. IMPRESSION: 1. Suspicious mass along the posterior bladder wall, measuring 2.1 cm, with internal blood flow, likely neoplastic. Consider cystoscopy for definitive characterization. 2. Mild RIGHT-sided hydronephrosis. 3. Bilateral renal cysts. No suspicious mass is seen within either kidney. However, soft tissue density mass has been described within the lower pole of the RIGHT renal collecting system on previous CT examinations dating back to 2017, suspicious for low-grade urothelial carcinoma. 4. Small echogenic LEFT kidney, corresponding to appearance on earlier CT of 12/25/2018, likely related to the previously described saccular aneurysm of the abdominal aorta which originates at or immediately adjacent to the expected origin of the LEFT renal artery causing chronic renal insufficiency. Electronically Signed   By: Franki Cabot M.D.   On: 01/22/2020 08:54   ASSESSMENT AND PLAN:  Jessica [name] is a 86 y.o. female with medical history significant of hypertension, hyperlipidemia, COPD, gout, depression, rectal bleeding, pacemaker placement, atrial fibrillation on Eliquis, urothelial cancer, CKD-stage III, who presents with shortness of breath.  Acute renal failure superimposed on stage 3a chronic kidney disease ([place]): Baseline Cre is 1.3, pt's Cre is 3.87 and BUN 59 on admission.  - Etiology is not clear. Likely due to dehydration and continuation of diuretics. H/o Uroeithelial mass right kidney with hydronephrosis and new [place] mass noted (seeding from renal mass) -- Dr. Holley Raring of renal is consulted --Urology consult with Dr Diamantina Providence -- recommends to hold eliquis in case patient needs and agreeable for urology procedure. - Avoid using renal toxic medications, hypotension and contrast dye (or carefully use) --Creat 3.93--3.48 -cont IVF  COPD exacerbation [facility]): Patient has short of  breath, mild wheezing on right side of the lung, indicating COPD exacerbation.  No oxygen desaturation. -Bronchodilators -Solu-Medrol 40 mg IV bid -Mucinex for cough  -Incentive spirometry - prn Nasal cannula oxygen as neededto maintain O2 saturation 93% or greater -wean to RA  HYPERTENSION, BENIGN -IV hydralazine as needed -Continue amlodipine, atenolol  Atrial fibrillation (HCC) -Continue Eliquis and atenolol  Hyperlipidemia -Crestor  Hypokalemia: K= 3.3 on admission. - Repleted K 4.2 - Mg level-2.0   DVT ppx: Eliquis (on hold for anticipated Urological procedure) Code Status: DNR per her son who is POA Family Communication: n Yes, patient's son in the room Disposition Plan:  Anticipate discharge back to previous ALF environment Consults called:  Dr. Holley Raring of renal, Dr Kathleene Hazel Admission status: Med-surg bed   Status is: Inpatient  Remains inpatient appropriate because:Inpatient level of care appropriate due to severity of illness   Dispo: The patient is from: ALF              Anticipated d/c is to: ALF              Anticipated d/c date is: 3 days              Patient currently is not medically stable to d/c. Urology and nephrology to see for rising creat       TOTAL TIME TAKING CARE OF THIS PATIENT: 25 minutes.  >50%  time spent on counselling and coordination of care  Note: This dictation was prepared with Dragon dictation along with smaller phrase technology. Any transcriptional errors that result from this process are unintentional.  Fritzi Mandes M.D    [facility]   CC: Primary care physician; [facility], Doctors MakingPatient ID: [name], female   DOB: Aug 13, 1929, 86 y.o.   MRN: [id number]

## 2020-01-23 DIAGNOSIS — I10 Essential (primary) hypertension: Secondary | ICD-10-CM | POA: Diagnosis not present

## 2020-01-23 DIAGNOSIS — I4891 Unspecified atrial fibrillation: Secondary | ICD-10-CM | POA: Diagnosis not present

## 2020-01-23 DIAGNOSIS — C689 Malignant neoplasm of urinary organ, unspecified: Secondary | ICD-10-CM

## 2020-01-23 DIAGNOSIS — N179 Acute kidney failure, unspecified: Secondary | ICD-10-CM | POA: Diagnosis not present

## 2020-01-23 DIAGNOSIS — J441 Chronic obstructive pulmonary disease with (acute) exacerbation: Secondary | ICD-10-CM | POA: Diagnosis not present

## 2020-01-23 MED ORDER — IPRATROPIUM-ALBUTEROL 0.5-2.5 (3) MG/3ML IN SOLN: 3.0000 mL | Freq: Two times a day (BID) | RESPIRATORY_TRACT | Status: DC

## 2020-01-23 MED ORDER — IPRATROPIUM-ALBUTEROL 0.5-2.5 (3) MG/3ML IN SOLN: 3.0000 mL | Freq: Three times a day (TID) | RESPIRATORY_TRACT | Status: DC

## 2020-01-23 MED ORDER — MELATONIN 5 MG PO TABS
5.0000 mg | ORAL_TABLET | Freq: Every day | ORAL | Status: DC
  Administered 2020-01-23 – 2020-01-25 (×2): 5 mg via ORAL
  Filled 2020-01-23 (×2): qty 1

## 2020-01-23 MED ORDER — PHENAZOPYRIDINE HCL 100 MG PO TABS
100.0000 mg | ORAL_TABLET | Freq: Three times a day (TID) | ORAL | Status: DC
  Administered 2020-01-23 – 2020-01-25 (×2): 100 mg via ORAL
  Filled 2020-01-23 (×7): qty 1

## 2020-01-23 MED ORDER — IPRATROPIUM-ALBUTEROL 0.5-2.5 (3) MG/3ML IN SOLN
3.0000 mL | Freq: Four times a day (QID) | RESPIRATORY_TRACT | Status: DC
  Administered 2020-01-23 – 2020-01-26 (×10): 3 mL via RESPIRATORY_TRACT
  Filled 2020-01-23 (×9): qty 3

## 2020-01-23 NOTE — Consult Note (Signed)
Urology Consult   I have been asked to see the patient by Dr. Posey Pronto, for evaluation and management of right hydronephrosis, right upper tract urothelial cell carcinoma, bladder tumor, and right distal ureteral stones.  Chief Complaint: Shortness of breath  HPI:  Jessica [name] is a 87 y.o. 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  She was also found to have AKI with a creatinine of 3.87(EGFR 11) from a baseline creatinine of 1.5(EGFR 33), and CT showed atrophic left kidney, moderate right hydronephrosis with small right distal ureteral stones, and appears to have extension of her tumor down the ureter and projecting into the bladder lumen measuring about 1.5 cm.  I originally met her in November 2019 for gross hematuria where CT showed a 2 cm right upper tract filling defect worrisome for upper tract urothelial cell carcinoma, and she refused any intervention, and did not follow-up with oncology or radiation oncology to discuss other options.  She was lost to follow-up at that time until she represented in December 2020 with gross hematuria and was found to have some enlargement of the upper tract mass, as well as a new 5 mm right proximal ureteral stone.  Ureteroscopy on 01/01/2019 was performed and the stone was removed, and pyeloscopy showed a large papillary upper tract mass in the lower pole and biopsy was consistent with low-grade noninvasive urothelial cell carcinoma.  We again reviewed options at length, and she deferred any endoscopic management of this large tumor.  She was understandably resistant to nephroureterectomy, as this likely would leave her dialysis dependent.  We last met in clinic in June 2021, and she again deferred any intervention for her right upper tract urothelial cell carcinoma  secondary to her age and comorbidities, and lack of symptoms.  Urinalysis benign with no bacteria, 6-10 WBCs, 0-5 RBCs, nitrite negative, no leukocytes.  No fevers or chills that would indicate upstream infection.  She currently resides in assisted living.  She denies any flank pain or gross hematuria.  Her only complaint is shortness of breath.  She remains on Eliquis for her history of atrial fibrillation, CHADSVASc score 7.  Last dose of Eliquis was 10:23 AM on 01/22/2020.  PMH: Past Medical History:  Diagnosis Date  . AAA (abdominal aortic aneurysm) ([place])   . Atrioventricular block, complete ([place])   . Cardiac pacemaker in situ   . COPD (chronic obstructive pulmonary disease) ([place])   . Gout   . Hernia   . Hyperlipidemia   . Hypertension   . Kidney stone   . Presence of permanent cardiac pacemaker   . Rectal bleeding     Surgical History: Past Surgical History:  Procedure Laterality Date  . ABDOMINAL AORTIC ANEURYSM REPAIR     son denies  . CARDIOVERSION N/A 02/06/2019   Procedure: CARDIOVERSION;  Surgeon: Minna Merritts, MD;  Location: [facility];  Service: Cardiovascular;  Laterality: N/A;  . CYSTOSCOPY/URETEROSCOPY/HOLMIUM LASER/STENT PLACEMENT Right 01/01/2019   Procedure: CYSTOSCOPY/URETEROSCOPY/HOLMIUM LASER/STENT PLACEMENT;  Surgeon: Billey Co, MD;  Location: [facility];  Service: Urology;  Laterality: Right;  . HERNIA REPAIR    . ohter     growth on colon surgery  . PPM GENERATOR CHANGEOUT N/A 02/25/2017   Procedure: PPM GENERATOR CHANGEOUT;  Surgeon: Deboraha Sprang, MD;  Location: [facility];  Service: Cardiovascular;  Laterality: N/A;  . RENAL ANGIOGRAPHY Right 12/03/2017   Procedure: RENAL ANGIOGRAPHY;  Surgeon: Katha Cabal, MD;  Location: [facility];  Service: Cardiovascular;  Laterality: Right;  . TONSILLECTOMY      Allergies:  Allergies  Allergen Reactions  . Codeine Other (See Comments)    Made her feel crazy  . Morphine  Other (See Comments)    Made her feel crazy  . Zyrtec [Cetirizine] Other (See Comments)    Causes facial numbness    Family History: Family History  Problem Relation Age of Onset  . Heart disease Father   . Alcohol abuse Father   . Cancer Brother   . Other Mother 81       MVA    Social History:  reports that she quit smoking about 24 years ago. Her smoking use included cigarettes. She has a 75.00 pack-year smoking history. She has never used smokeless tobacco. She reports previous alcohol use. She reports that she does not use drugs.  ROS: Negative aside from those stated in the HPI.  Physical Exam: BP 135/72 (BP Location: Left Arm)   Pulse 70   Temp 97.7 F (36.5 C) (Oral)   Resp 18   Ht $R'5\' 7"'Sl$  (1.702 m)   Wt 61.2 kg   SpO2 93%   BMI 21.14 kg/m    Constitutional:  Alert and oriented, No acute distress.  Conversational, son at bedside Cardiovascular: Irregularly irregular Respiratory: Clear to auscultation, decreased at the bases GI: Abdomen is soft, nontender, nondistended, no abdominal masses GU: No CVA tenderness Lymph: No cervical or inguinal lymphadenopathy. Skin: No rashes, bruises or suspicious lesions. Neurologic: Grossly intact, no focal deficits, moving all 4 extremities. Psychiatric: Normal mood and affect.  Laboratory Data: Reviewed in epic, see HPI  Pertinent Imaging: I have personally reviewed and interpreted the CT abdomen pelvis without contrast dated 01/22/2020 notable for multiple small right distal ureteral stones with moderate right-sided hydronephrosis, and there appears to be extension of her upper tract tumor down the right ureter and emanating into the bladder with a 1 cm bladder mass at the location of the right ureteral orifice.  Left kidney is atrophic.  Assessment & Plan:   She is an 87 year old comorbid and frail female with an atrophic left kidney and 3+ year history of low-grade non-invasive right upper tract urothelial cell that she has  refused any intervention for and opted for watchful waiting, as well as a history of nephrolithiasis.  She is currently admitted with worsening shortness of breath, found to have acute on chronic kidney injury with EGFR of 11 from baseline 33, and CT showing progression of her right upper tract urothelial cell carcinoma projecting down the right ureter and into the bladder, as well as multiple small right distal ureteral stones and resulting right-sided hydronephrosis that is the likely etiology of her AKI.  I had a very long conversation with the patient and her son about her clinical situation and imaging findings.  With her age and comorbidities, right nephroureterectomy is not a good option as any surgery would be extremely high risk and she would be dialysis dependent.  She refuses this.  Nephrostomy tube placement in the right kidney by interventional radiology could improve her renal function, but with significantly negatively impact her quality of life as she would have a permanent drain and bag, and this would require exchange every 2 months.  It would also increased risk of bleeding as well as percutaneous seeding  of tumor.  She defers this option as well.  If she opts for any surgical intervention, I think cystoscopy, TURBT, right ureteroscopy, laser lithotripsy, and attempted stent placement would be the best option.  Based on the CT findings, unfortunately I do think some tumor resection would be required to access the right distal ureter for stone removal and stent placement, and there is a chance that I am unable to place a stent based on extent of tumor.  Risks would include bleeding, infection, and inability to place stent, as well as possible stent related symptoms of flank pain/urgency/frequency/dysuria.  We discussed the stent could stir up bleeding based on the extent of her tumor and anticoagulation, and I would certainly recommend holding anticoagulation if at all possible.  The stent  would need to be exchanged every 9 to 12 months, and could be done under sedation which would take about 10 to 15 minutes.  There is also a possibility of persistent obstruction despite appropriate stent placement based on the extent of her tumor.  Ideally we would wait at least 48 hours from the last Eliquis dose prior to surgery with the need to likely resect tumor to access the right distal ureter.  Finally, we discussed that observation is an option with her age and comorbidities, and that her renal failure would likely progress ultimately resulting in death.  We discussed that very challenging to predict if this process would take weeks or months.  There is also a risk of upstream infection causing urosepsis and death.  Fortunately, she is not having any pain or gross hematuria at this time, and is very comfortable aside from the shortness of breath.  Recommendations: After extensive discussion of the risks and benefits, the patient and her son opted for intervention with cystoscopy, TURBT, right ureteroscopy, laser lithotripsy, and attempted stent placement.  We will plan for tomorrow morning to wait 48 hours after last Eliquis dose.  We again reviewed that her tumor burden is not manageable endoscopically at this point, and this is a temporizing measure to drain the right kidney, and she will likely require indefinite right ureteral stent exchanges.  We also reviewed the likelihood of ongoing tumor progression over the next few months to years.  Marked and informed consent obtained N.p.o. at midnight Continue to hold Eliquis OR tomorrow for transurethral resection of bladder tumor, right ureteroscopy, laser lithotripsy, stent placement  Billey Co, MD  Total time spent on the floor was 120 minutes, with greater than 50% spent in counseling and coordination of care with the patient and her son regarding right upper tract urothelial cell carcinoma, right distal ureteral stones,  hydronephrosis, renal failure, and treatment options.  [facility] [address] [place], [place] [address] [phone]

## 2020-01-23 NOTE — Progress Notes (Signed)
[facility] [place] NAME: Jessica [name]    MR#:  [id number]  DATE OF BIRTH:  [birth year]  SUBJECTIVE:   Patient came in with increasing shortness of breath and generalized weakness. She was found to have elevated creatinine of 3.48. Baseline creatinine 1.5.  Today feels better. She is on room air 93 to 94%. Son in the room. Met dr Diamantina Providence earlier  REVIEW OF SYSTEMS:   Review of Systems  Constitutional: Negative for chills, fever and weight loss.  HENT: Negative for ear discharge, ear pain and nosebleeds.   Eyes: Negative for blurred vision, pain and discharge.  Respiratory: Positive for shortness of breath. Negative for sputum production, wheezing and stridor.   Cardiovascular: Negative for chest pain, palpitations, orthopnea and PND.  Gastrointestinal: Negative for abdominal pain, diarrhea, nausea and vomiting.  Genitourinary: Negative for frequency and urgency.  Musculoskeletal: Negative for back pain and joint pain.  Neurological: Positive for weakness. Negative for sensory change, speech change and focal weakness.  Psychiatric/Behavioral: Negative for depression and hallucinations. The patient is not nervous/anxious.    Tolerating Diet:yes Tolerating PT: pending  DRUG ALLERGIES:   Allergies  Allergen Reactions  . Codeine Other (See Comments)    Made her feel crazy  . Morphine Other (See Comments)    Made her feel crazy  . Zyrtec [Cetirizine] Other (See Comments)    Causes facial numbness    VITALS:  Blood pressure 135/72, pulse 70, temperature 97.7 F (36.5 C), temperature source Oral, resp. rate 18, height _0  (1.702 m), weight 61.2 kg, SpO2 93 %.  PHYSICAL EXAMINATION:   Physical Exam  GENERAL:  87 y.o.-year-old patient lying in the bed with no acute distress.   LUNGS:decreased breath sounds bilaterally, no wheezing, rales, rhonchi. No use of accessory muscles of respiration.  CARDIOVASCULAR: S1, S2 normal. No  murmurs, rubs, or gallops.  ABDOMEN: Soft, nontender, nondistended. Bowel sounds present. No organomegaly or mass.  EXTREMITIES: No cyanosis, clubbing or edema b/l.    NEUROLOGIC: Cranial nerves II through XII are intact. No focal Motor or sensory deficits b/l.   PSYCHIATRIC:  patient is alert and oriented x 3.  SKIN: No obvious rash, lesion, or ulcer.   LABORATORY PANEL:  CBC Recent Labs  Lab 01/22/20 0609  WBC 2.9*  HGB 10.4*  HCT 30.7*  PLT 133*    Chemistries  Recent Labs  Lab 01/21/20 1404 01/22/20 0609  NA  --  142  K  --  4.2  CL  --  115*  CO2  --  19*  GLUCOSE  --  135*  BUN  --  61*  CREATININE  --  3.48*  CALCIUM  --  8.2*  MG 2.0  --    Cardiac Enzymes No results for input(s): TROPONINI in the last 168 hours. RADIOLOGY:  CT ABDOMEN PELVIS WO CONTRAST  Result Date: 01/22/2020 CLINICAL DATA:  87 year old female with history of right upper tract urothelial carcinoma. EXAM: CT ABDOMEN AND PELVIS WITHOUT CONTRAST TECHNIQUE: Multidetector CT imaging of the abdomen and pelvis was performed following the standard protocol without IV contrast. COMPARISON:  CT the abdomen and pelvis 12/25/2018. FINDINGS: Lower chest: Small right and trace left pleural effusions. Cardiomegaly. Severe calcifications of the aortic valve. Atherosclerotic calcifications in the descending thoracic aorta. Pacemaker leads in the right atrium and right ventricle. Left-sided fat containing Bochdalek's hernia. Hepatobiliary: Liver has a shrunken appearance and nodular contour, suggesting underlying cirrhosis. Small focus of low attenuation  in segment 4B adjacent to the falciform ligament, most compatible with an area of focal fatty infiltration. No other definite suspicious cystic or solid hepatic lesions are confidently identified on today's noncontrast CT examination. Subcentimeter calcified gallstone lying dependently in the neck of the gallbladder. No findings to suggest an acute cholecystitis are  noted at this time. Pancreas: No definite pancreatic mass or peripancreatic fluid collections or inflammatory changes noted on today's noncontrast CT examination. Spleen: Unremarkable. Adrenals/Urinary Tract: Severe atrophy of the left kidney. Multiple low-attenuation lesions in both kidneys, incompletely characterized on today's non-contrast CT examination, but statistically likely to represent cysts. Multiple tiny nonobstructive calculi within the right renal collecting system measuring 2-3 mm in size. In addition, there are several 2-3 mm calculi lying dependently in the distal third of the right ureter shortly before the right ureterovesicular junction, best appreciated on coronal image 53 of series 5. There is mild right-sided hydroureter and moderate right-sided hydronephrosis. This appears related to a intermediate attenuation structure at the level of the right ureterovesicular junction (axial image 74 of series 2 and coronal image 50 of series 5) which measures approximately 1.3 x 1.3 x 1.2 cm. In addition, there is some intermediate attenuation thickening of the structures in the right renal hilum, poorly demonstrated on today's noncontrast CT examination. These findings are concerning for tumor. No left-sided hydroureteronephrosis. Bilateral adrenal glands are normal in appearance. Stomach/Bowel: Unenhanced appearance of the stomach is normal. There is no pathologic dilatation of small bowel or colon. Numerous colonic diverticulae are noted, without surrounding inflammatory changes to suggest an acute diverticulitis at this time. Status post appendectomy. Short segment of mid transverse colon extending into a small epigastric ventral hernia. Vascular/Lymphatic: Aortic atherosclerosis with postoperative changes of prior aorto bi-iliac bypass graft, poorly evaluated on today's noncontrast CT examination. 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 No definite lymphadenopathy identified in the abdomen or pelvis. Reproductive: Uterus and ovaries are atrophic. Small calcifications within the uterus, likely reflective of calcified fibroids. Other: Postoperative changes of mesh repair for ventral hernia. Trace volume of ascites. No pneumoperitoneum. Musculoskeletal: There are no aggressive appearing lytic or blastic lesions noted in the visualized portions of the skeleton. IMPRESSION: 1. Moderate right hydronephrosis and mild right hydroureter with intermediate attenuation structure at the right ureterovesicular junction concerning for partially obstructing tumor (although thrombus could have a similar appearance). There is also probable tumor in the right renal collecting system poorly evaluated on today's noncontrast CT examination. 2. Multiple nonobstructive calculi in the right renal collecting system measuring 2-3 mm in size, as well as a cluster of small 2-3 mm calculi lying dependently in the dilated distal third of the right ureter. 3. Cirrhosis with trace volume of ascites. 4. Cardiomegaly with small right and trace left pleural effusions. 5. There are calcifications of the aortic valve. Echocardiographic correlation for evaluation of potential valvular dysfunction may be warranted if clinically indicated. 6. 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 7. Additional incidental findings, as above. Electronically Signed   By: Vinnie Langton M.D.   On: 01/22/2020 16:35   US RENAL  Result Date: 01/22/2020 CLINICAL DATA:  Acute kidney injury EXAM: RENAL / URINARY TRACT ULTRASOUND COMPLETE COMPARISON:  CT abdomen dated 12/25/2018 FINDINGS: Right Kidney: Renal measurements: 12.1 x 7.3 x 5.9 cm =  volume: 274 mL. Cortical echogenicity is  within normal limits. Multiple benign cysts. No suspicious mass is seen. Mild hydronephrosis. Left Kidney: Renal measurements: 7 x 3.9 x 3.9 cm = volume: 57 mL. Echogenic renal cortex. Multiple benign cysts. No suspicious mass is seen. No hydronephrosis. Bladder: Mass along the posterior bladder wall measures 2.1 cm, with internal vascularity, likely neoplastic. Bladder is otherwise unremarkable. No bladder stone is seen. Other: None. IMPRESSION: 1. Suspicious mass along the posterior bladder wall, measuring 2.1 cm, with internal blood flow, likely neoplastic. Consider cystoscopy for definitive characterization. 2. Mild RIGHT-sided hydronephrosis. 3. Bilateral renal cysts. No suspicious mass is seen within either kidney. However, soft tissue density mass has been described within the lower pole of the RIGHT renal collecting system on previous CT examinations dating back to 2017, suspicious for low-grade urothelial carcinoma. 4. Small echogenic LEFT kidney, corresponding to appearance on earlier CT of 12/25/2018, likely related to the previously described saccular aneurysm of the abdominal aorta which originates at or immediately adjacent to the expected origin of the LEFT renal artery causing chronic renal insufficiency. Electronically Signed   By: Franki Cabot M.D.   On: 01/22/2020 08:54   ASSESSMENT AND PLAN:  ADONIA PORADA is a 87 y.o. female with medical history significant of hypertension, hyperlipidemia, COPD, gout, depression, rectal bleeding, pacemaker placement, atrial fibrillation on Eliquis, urothelial cancer, CKD-stage III, who presents with shortness of breath.  Acute renal failure superimposed on stage 3a chronic kidney disease ([place]): Baseline Cre is 1.3, pt's Cre is 3.87 and BUN 59 on admission.  - Etiology is not clear. Likely due to dehydration and continuation of diuretics. H/o Uroeithelial mass right kidney with hydronephrosis and new [place] mass noted (seeding from renal mass) -- Dr.  Holley Raring of renal is consulted --Urology consult with Dr Diamantina Providence -- recommends to hold eliquis  --01/23/2020-- plans for cystoscopy, TURBT, right ureteroscopy, laser lithotripsy, and attempted stent placement on sunday - Avoid using renal toxic medications, hypotension and contrast dye (or carefully use) --Creat 3.93--3.48 -cont IVF  COPD exacerbation [facility]): Patient has short of breath, mild wheezing on right side of the lung, indicating COPD exacerbation.  No oxygen desaturation. -Bronchodilators -Solu-Medrol 40 mg IV bid--now d/ced since no wheezing and pt on RA -Mucinex for cough  -Incentive spirometry - prn Nasal cannula oxygen as neededto maintain O2 saturation 93% or greater -weaned to RA--93% on RA  HYPERTENSION, BENIGN -IV hydralazine as needed -Continue amlodipine, atenolol  Atrial fibrillation (HCC) -(On hold) Eliquis  and cont atenolol  Hyperlipidemia -Crestor  Hypokalemia: K= 3.3 on admission. - Repleted K 4.2 - Mg level-2.0   DVT ppx: Eliquis (on hold for anticipated Urological procedure) Code Status: DNR per her son who is POA Family Communication: n Yes, patient's son in the room Disposition Plan:  Anticipate discharge back to previous ALF environment Consults called:  Dr. Holley Raring of renal, Dr Kathleene Hazel Admission status: Med-surg bed   Status is: Inpatient  Remains inpatient appropriate because:Inpatient level of care appropriate due to severity of illness   Dispo: The patient is from: ALF              Anticipated d/c is to: ALF              Anticipated d/c date is: 3 days              Patient currently is not medically stable to d/c. Urology and nephrology to see for rising creat Planned urological procedure in am  TOTAL TIME TAKING CARE OF THIS PATIENT: 25 minutes.  >50% time spent on counselling and coordination of care  Note: This dictation was prepared with Dragon dictation along with smaller phrase technology. Any  transcriptional errors that result from this process are unintentional.  Fritzi Mandes M.D    [facility]   CC: Primary care physician; [facility], Doctors MakingPatient ID: Jessica [name], female   DOB: [birth year], 87 y.o.   MRN: [id number]

## 2020-01-23 NOTE — H&P (View-Only) (Signed)
Urology Consult   I have been asked to see the patient by Dr. Posey Pronto, for evaluation and management of right hydronephrosis, right upper tract urothelial cell carcinoma, bladder tumor, and right distal ureteral stones.  Chief Complaint: Shortness of breath  HPI:  Jessica [name] is a 87 y.o. 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  She was also found to have AKI with a creatinine of 3.87(EGFR 11) from a baseline creatinine of 1.5(EGFR 33), and CT showed atrophic left kidney, moderate right hydronephrosis with small right distal ureteral stones, and appears to have extension of her tumor down the ureter and projecting into the bladder lumen measuring about 1.5 cm.  I originally met her in November 2019 for gross hematuria where CT showed a 2 cm right upper tract filling defect worrisome for upper tract urothelial cell carcinoma, and she refused any intervention, and did not follow-up with oncology or radiation oncology to discuss other options.  She was lost to follow-up at that time until she represented in December 2020 with gross hematuria and was found to have some enlargement of the upper tract mass, as well as a new 5 mm right proximal ureteral stone.  Ureteroscopy on 01/01/2019 was performed and the stone was removed, and pyeloscopy showed a large papillary upper tract mass in the lower pole and biopsy was consistent with low-grade noninvasive urothelial cell carcinoma.  We again reviewed options at length, and she deferred any endoscopic management of this large tumor.  She was understandably resistant to nephroureterectomy, as this likely would leave her dialysis dependent.  We last met in clinic in June 2021, and she again deferred any intervention for her right upper tract urothelial cell carcinoma  secondary to her age and comorbidities, and lack of symptoms.  Urinalysis benign with no bacteria, 6-10 WBCs, 0-5 RBCs, nitrite negative, no leukocytes.  No fevers or chills that would indicate upstream infection.  She currently resides in assisted living.  She denies any flank pain or gross hematuria.  Her only complaint is shortness of breath.  She remains on Eliquis for her history of atrial fibrillation, CHADSVASc score 7.  Last dose of Eliquis was 10:23 AM on 01/22/2020.  PMH: Past Medical History:  Diagnosis Date  . AAA (abdominal aortic aneurysm) ([place])   . Atrioventricular block, complete ([place])   . Cardiac pacemaker in situ   . COPD (chronic obstructive pulmonary disease) ([place])   . Gout   . Hernia   . Hyperlipidemia   . Hypertension   . Kidney stone   . Presence of permanent cardiac pacemaker   . Rectal bleeding     Surgical History: Past Surgical History:  Procedure Laterality Date  . ABDOMINAL AORTIC ANEURYSM REPAIR     son denies  . CARDIOVERSION N/A 02/06/2019   Procedure: CARDIOVERSION;  Surgeon: Minna Merritts, MD;  Location: [facility];  Service: Cardiovascular;  Laterality: N/A;  . CYSTOSCOPY/URETEROSCOPY/HOLMIUM LASER/STENT PLACEMENT Right 01/01/2019   Procedure: CYSTOSCOPY/URETEROSCOPY/HOLMIUM LASER/STENT PLACEMENT;  Surgeon: Billey Co, MD;  Location: [facility];  Service: Urology;  Laterality: Right;  . HERNIA REPAIR    . ohter     growth on colon surgery  . PPM GENERATOR CHANGEOUT N/A 02/25/2017   Procedure: PPM GENERATOR CHANGEOUT;  Surgeon: Deboraha Sprang, MD;  Location: [facility];  Service: Cardiovascular;  Laterality: N/A;  . RENAL ANGIOGRAPHY Right 12/03/2017   Procedure: RENAL ANGIOGRAPHY;  Surgeon: Katha Cabal, MD;  Location: [facility];  Service: Cardiovascular;  Laterality: Right;  . TONSILLECTOMY      Allergies:  Allergies  Allergen Reactions  . Codeine Other (See Comments)    Made her feel crazy  . Morphine  Other (See Comments)    Made her feel crazy  . Zyrtec [Cetirizine] Other (See Comments)    Causes facial numbness    Family History: Family History  Problem Relation Age of Onset  . Heart disease Father   . Alcohol abuse Father   . Cancer Brother   . Other Mother 36       MVA    Social History:  reports that she quit smoking about 24 years ago. Her smoking use included cigarettes. She has a 75.00 pack-year smoking history. She has never used smokeless tobacco. She reports previous alcohol use. She reports that she does not use drugs.  ROS: Negative aside from those stated in the HPI.  Physical Exam: BP 135/72 (BP Location: Left Arm)   Pulse 70   Temp 97.7 F (36.5 C) (Oral)   Resp 18   Ht $R'5\' 7"'Ve$  (1.702 m)   Wt 61.2 kg   SpO2 93%   BMI 21.14 kg/m    Constitutional:  Alert and oriented, No acute distress.  Conversational, son at bedside Cardiovascular: Irregularly irregular Respiratory: Clear to auscultation, decreased at the bases GI: Abdomen is soft, nontender, nondistended, no abdominal masses GU: No CVA tenderness Lymph: No cervical or inguinal lymphadenopathy. Skin: No rashes, bruises or suspicious lesions. Neurologic: Grossly intact, no focal deficits, moving all 4 extremities. Psychiatric: Normal mood and affect.  Laboratory Data: Reviewed in epic, see HPI  Pertinent Imaging: I have personally reviewed and interpreted the CT abdomen pelvis without contrast dated 01/22/2020 notable for multiple small right distal ureteral stones with moderate right-sided hydronephrosis, and there appears to be extension of her upper tract tumor down the right ureter and emanating into the bladder with a 1 cm bladder mass at the location of the right ureteral orifice.  Left kidney is atrophic.  Assessment & Plan:   She is an 87 year old comorbid and frail female with an atrophic left kidney and 3+ year history of low-grade non-invasive right upper tract urothelial cell that she has  refused any intervention for and opted for watchful waiting, as well as a history of nephrolithiasis.  She is currently admitted with worsening shortness of breath, found to have acute on chronic kidney injury with EGFR of 11 from baseline 33, and CT showing progression of her right upper tract urothelial cell carcinoma projecting down the right ureter and into the bladder, as well as multiple small right distal ureteral stones and resulting right-sided hydronephrosis that is the likely etiology of her AKI.  I had a very long conversation with the patient and her son about her clinical situation and imaging findings.  With her age and comorbidities, right nephroureterectomy is not a good option as any surgery would be extremely high risk and she would be dialysis dependent.  She refuses this.  Nephrostomy tube placement in the right kidney by interventional radiology could improve her renal function, but with significantly negatively impact her quality of life as she would have a permanent drain and bag, and this would require exchange every 2 months.  It would also increased risk of bleeding as well as percutaneous seeding  of tumor.  She defers this option as well.  If she opts for any surgical intervention, I think cystoscopy, TURBT, right ureteroscopy, laser lithotripsy, and attempted stent placement would be the best option.  Based on the CT findings, unfortunately I do think some tumor resection would be required to access the right distal ureter for stone removal and stent placement, and there is a chance that I am unable to place a stent based on extent of tumor.  Risks would include bleeding, infection, and inability to place stent, as well as possible stent related symptoms of flank pain/urgency/frequency/dysuria.  We discussed the stent could stir up bleeding based on the extent of her tumor and anticoagulation, and I would certainly recommend holding anticoagulation if at all possible.  The stent  would need to be exchanged every 9 to 12 months, and could be done under sedation which would take about 10 to 15 minutes.  There is also a possibility of persistent obstruction despite appropriate stent placement based on the extent of her tumor.  Ideally we would wait at least 48 hours from the last Eliquis dose prior to surgery with the need to likely resect tumor to access the right distal ureter.  Finally, we discussed that observation is an option with her age and comorbidities, and that her renal failure would likely progress ultimately resulting in death.  We discussed that very challenging to predict if this process would take weeks or months.  There is also a risk of upstream infection causing urosepsis and death.  Fortunately, she is not having any pain or gross hematuria at this time, and is very comfortable aside from the shortness of breath.  Recommendations: After extensive discussion of the risks and benefits, the patient and her son opted for intervention with cystoscopy, TURBT, right ureteroscopy, laser lithotripsy, and attempted stent placement.  We will plan for tomorrow morning to wait 48 hours after last Eliquis dose.  We again reviewed that her tumor burden is not manageable endoscopically at this point, and this is a temporizing measure to drain the right kidney, and she will likely require indefinite right ureteral stent exchanges.  We also reviewed the likelihood of ongoing tumor progression over the next few months to years.  Marked and informed consent obtained N.p.o. at midnight Continue to hold Eliquis OR tomorrow for transurethral resection of bladder tumor, right ureteroscopy, laser lithotripsy, stent placement  Billey Co, MD  Total time spent on the floor was 120 minutes, with greater than 50% spent in counseling and coordination of care with the patient and her son regarding right upper tract urothelial cell carcinoma, right distal ureteral stones,  hydronephrosis, renal failure, and treatment options.  [facility] [address] [place], [place] [address] [phone]

## 2020-01-23 NOTE — Consult Note (Signed)
[facility] CONSULT NOTE    Date: 01/23/2020                  Patient Name:  Jessica [name]  MRN: [id number]  DOB: 1929/06/27  Age / Sex: 87 y.o., female         PCP: [facility] [employer] Consult:  Hospitalist                 Reason for Consult:  Acute kidney injury/chronic kidney disease stage IIIb/right-sided hydronephrosis            History of Present Illness: Patient is a 87 y.o. 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  She has complex urologic history and is actively followed by urology.  As above she has history of large papillary upper tract mass in prior biopsy was consistent with low-grade noninvasive urothelial cell carcinoma.  She previously declined management as this would likely have left her dialysis dependent.  She also has underlying atrial fibrillation and has been on Eliquis.  She now has worsening hydronephrosis.  Upon presentation creatinine was 3.87 but now down to 3.48.  eGFR is only 12.  It appears that she is not in favor of hemodialysis which is certainly reasonable given her age and underlying comorbidities.  At the moment the current urologic plan is to proceed with cystoscopy, TURBT, right ureteroscopy, laser lithotripsy, and attempted stent placement.  However this is a temporizing measure to drain the right kidney.   Medications: Outpatient medications: Medications Prior to Admission  Medication Sig Dispense Refill Last Dose  . ALLERGY RELIEF 10 MG tablet Take 10 mg by mouth daily.   01/21/2020 at 0700  . amLODipine (NORVASC) 5 MG tablet Take 1 tablet  (5 mg total) by mouth daily. Please call to schedule office visit for further refills. 90 tablet 0 01/21/2020 at 0700  . apixaban (ELIQUIS) 2.5 MG TABS tablet Take 1 tablet (2.5 mg total) by mouth 2 (two) times daily. 180 tablet 1 01/21/2020 at 0700  . atenolol (TENORMIN) 25 MG tablet Take 1 tablet (25 mg total) by mouth daily. Please call to schedule office visit for further refills. 90 tablet 0 01/21/2020 at 0700  . cholecalciferol 25 MCG (1000 UT) TABS Take 1,000 Units by mouth daily.   01/21/2020 at 0700  . Fluticasone-Salmeterol (ADVAIR DISKUS) 250-50 MCG/DOSE AEPB Inhale 1 puff into the lungs in the morning and at bedtime. 180 each 3 01/21/2020 at 0700  . melatonin 3 MG TABS tablet Take 1 tablet (3 mg) by mouth once daily at bedtime as needed for sleep   01/20/2020 at 1900  . rosuvastatin (CRESTOR) 5 MG tablet Take 1 tablet (5 mg total) by mouth daily. 90 tablet 3 01/20/2020 at 1900  . tamsulosin (FLOMAX) 0.4 MG CAPS capsule Take 1 capsule (0.4 mg total) by mouth daily. 30 capsule 0 01/21/2020 at 0700  . vitamin B-12 (CYANOCOBALAMIN) 1000 MCG tablet Take 1 tablet (1000 mg) by mouth once daily at bedtime   01/20/2020 at 1900  . acetaminophen (TYLENOL) 500 MG tablet Take 500 mg by mouth every 6 (six) hours  as needed for moderate pain or headache.   unknown at prn  . albuterol (PROAIR HFA) 108 (90 Base) MCG/ACT inhaler Inhale 2 puffs into the lungs every 6 (six) hours as needed for wheezing or shortness of breath. 1 Inhaler 5 unknown at prn  . furosemide (LASIX) 20 MG tablet Take 1 tablet (20 mg total) by mouth as needed (for shortness of breath/edema). 30 tablet 6   . senna-docusate (SENOKOT-S) 8.6-50 MG tablet Take 2 tablets by mouth once daily at bedtime as needed for constipation   unknown at prn    Current medications: Current Facility-Administered Medications  Medication Dose Route Frequency Provider Last Rate Last Admin  . 0.9 %  sodium chloride infusion   Intravenous Continuous Fritzi Mandes, MD 50 mL/hr at 01/23/20 0431 Infusion Verify at 01/23/20 0431  . acetaminophen (TYLENOL) tablet 650 mg  650 mg Oral Q6H PRN Ivor Costa, MD      . albuterol (PROVENTIL) (2.5 MG/3ML) 0.083% nebulizer solution 2.5 mg  2.5 mg Nebulization Q4H PRN Ivor Costa, MD   2.5 mg at 01/23/20 0242  . amLODipine (NORVASC) tablet 5 mg  5 mg Oral Daily Ivor Costa, MD   5 mg at 01/23/20 0936  . atenolol (TENORMIN) tablet 25 mg  25 mg Oral Daily Ivor Costa, MD   25 mg at 01/23/20 2952  . cholecalciferol (VITAMIN D3) tablet 1,000 Units  1,000 Units Oral Daily Ivor Costa, MD   1,000 Units at 01/23/20 [phone]  . dextromethorphan-guaiFENesin (MUCINEX DM) 30-600 MG per 12 hr tablet 1 tablet  1 tablet Oral BID PRN Ivor Costa, MD      . hydrALAZINE (APRESOLINE) injection 5 mg  5 mg Intravenous Q2H PRN Ivor Costa, MD      . ipratropium-albuterol (DUONEB) 0.5-2.5 (3) MG/3ML nebulizer solution 3 mL  3 mL Nebulization BID Fritzi Mandes, MD      . loratadine (CLARITIN) tablet 10 mg  10 mg Oral Daily Ivor Costa, MD   10 mg at 01/23/20 0936  . melatonin tablet 3 mg  3 mg Oral QHS PRN Ivor Costa, MD      . mometasone-formoterol [facility]) 200-5 MCG/ACT inhaler 2 puff  2 puff Inhalation BID Ivor Costa, MD   2 puff at 01/23/20 [phone]  . ondansetron (ZOFRAN) injection 4 mg  4 mg Intravenous Q8H PRN Ivor Costa, MD      . rosuvastatin (CRESTOR) tablet 5 mg  5 mg Oral Daily Ivor Costa, MD   5 mg at 01/22/20 2118  . senna-docusate (Senokot-S) tablet 1 tablet  1 tablet Oral QHS PRN Ivor Costa, MD      . tamsulosin [facility]) capsule 0.4 mg  0.4 mg Oral Daily Ivor Costa, MD   0.4 mg at 01/23/20 1027  . vitamin B-12 (CYANOCOBALAMIN) tablet 1,000 mcg  1,000 mcg Oral Daily Ivor Costa, MD   1,000 mcg at 01/22/20 2118      Allergies: Allergies  Allergen Reactions  . Codeine Other (See Comments)    Made her feel crazy  . Morphine Other (See Comments)    Made her feel crazy  . Zyrtec [Cetirizine] Other (See Comments)    Causes facial numbness       Past Medical History: Past Medical History:  Diagnosis Date  . AAA (abdominal aortic aneurysm) ([place])   . Atrioventricular block, complete ([place])   . Cardiac pacemaker in situ   . COPD (chronic obstructive pulmonary disease) ([place])   . Gout   . Hernia   .  Hyperlipidemia   . Hypertension   . Kidney stone   . Presence of permanent cardiac pacemaker   . Rectal bleeding      Past Surgical History: Past Surgical History:  Procedure Laterality Date  . ABDOMINAL AORTIC ANEURYSM REPAIR     son denies  . CARDIOVERSION N/A 02/06/2019   Procedure: CARDIOVERSION;  Surgeon: Minna Merritts, MD;  Location: [facility] ORS;  Service: Cardiovascular;  Laterality: N/A;  . CYSTOSCOPY/URETEROSCOPY/HOLMIUM LASER/STENT PLACEMENT Right 01/01/2019   Procedure: CYSTOSCOPY/URETEROSCOPY/HOLMIUM LASER/STENT PLACEMENT;  Surgeon: Billey Co, MD;  Location: [facility] ORS;  Service: Urology;  Laterality: Right;  . HERNIA REPAIR    . ohter     growth on colon surgery  . PPM GENERATOR CHANGEOUT N/A 02/25/2017   Procedure: PPM GENERATOR CHANGEOUT;  Surgeon: Deboraha Sprang, MD;  Location: [facility];  Service: Cardiovascular;  Laterality: N/A;  . RENAL ANGIOGRAPHY Right 12/03/2017   Procedure: RENAL ANGIOGRAPHY;  Surgeon: Katha Cabal, MD;  Location: [facility];  Service: Cardiovascular;  Laterality: Right;  . TONSILLECTOMY       Family History: Family History  Problem Relation Age of Onset  . Heart disease Father   . Alcohol abuse Father   . Cancer Brother   . Other Mother 75       MVA     Social History: Social History   Socioeconomic History  . Marital status: Widowed    Spouse name: Not on file  . Number of children: 2  . Years of education: Not on file  . Highest education level: Not on file  Occupational History  . Occupation: Retired    Fish farm manager: RETIRED  Tobacco Use  . Smoking status: Former Smoker    Packs/day: 1.50    Years: 50.00    Pack years: 75.00     Types: Cigarettes    Quit date: 10/03/1995    Years since quitting: 24.3  . Smokeless tobacco: Never Used  Vaping Use  . Vaping Use: Never used  Substance and Sexual Activity  . Alcohol use: Not Currently  . Drug use: No  . Sexual activity: Not Currently  Other Topics Concern  . Not on file  Social History Narrative   Born in [place]. Lives in [place]. Son lives nearby.      No regular exercise    Social Determinants of Health   Financial Resource Strain: Low Risk   . Difficulty of Paying Living Expenses: Not hard at all  Food Insecurity: Not on file  Transportation Needs: Not on file  Physical Activity: Not on file  Stress: Not on file  Social Connections: Unknown  . Frequency of Communication with Friends and Family: More than three times a week  . Frequency of Social Gatherings with Friends and Family: Once a week  . Attends Religious Services: 1 to 4 times per year  . Active Member of Clubs or Organizations: Yes  . Attends Archivist Meetings: 1 to 4 times per year  . Marital Status: Not on file  Intimate Partner Violence: Not on file     Review of Systems: Review of Systems  Constitutional: Positive for malaise/fatigue. Negative for chills and fever.  HENT: Negative for congestion, hearing loss and tinnitus.   Eyes: Negative for blurred vision and double vision.  Respiratory: Positive for shortness of breath. Negative for cough and sputum production.   Cardiovascular: Negative for chest pain, palpitations and orthopnea.  Gastrointestinal: Negative for diarrhea, nausea and vomiting.  Genitourinary: Positive for dysuria. Negative for frequency and urgency.  Musculoskeletal: Negative for myalgias.  Skin: Negative for itching and rash.  Neurological: Positive for weakness. Negative for dizziness and focal weakness.  Endo/Heme/Allergies: Negative for polydipsia. Does not bruise/bleed easily.  Psychiatric/Behavioral: The patient is not nervous/anxious.       Vital Signs: Blood pressure 135/72, pulse 70, temperature 97.7 F (36.5 C), temperature source Oral, resp. rate 18, height $RemoveBe'5\' 7"'hUyZOvuRV$  (1.702 m), weight 61.2 kg, SpO2 93 %.  Weight trends: Filed Weights   01/21/20 0918  Weight: 61.2 kg    Physical Exam: Physical Exam: General:  No acute distress  Head:  Normocephalic, atraumatic. Moist oral mucosal membranes  Eyes:  Anicteric  Neck:  Supple  Lungs:   Clear to auscultation, normal effort  Heart:  S1S2 irregular  Abdomen:   Soft, nontender, bowel sounds present  Extremities:  Trace peripheral edema.  Neurologic:  Awake, alert, following commands  Skin:  No lesions  Access:  No hemodialysis access    Lab results: Basic Metabolic Panel: Recent Labs  Lab 01/21/20 0843 01/21/20 1404 01/22/20 0609  NA 141  --  142  K 3.3*  --  4.2  CL 108  --  115*  CO2 21*  --  19*  GLUCOSE 114*  --  135*  BUN 59*  --  61*  CREATININE 3.87*  --  3.48*  CALCIUM 8.8*  --  8.2*  MG  --  2.0  --     Liver Function Tests: No results for input(s): AST, ALT, ALKPHOS, BILITOT, PROT, ALBUMIN in the last 168 hours. No results for input(s): LIPASE, AMYLASE in the last 168 hours. No results for input(s): AMMONIA in the last 168 hours.  CBC: Recent Labs  Lab 01/21/20 0843 01/22/20 0609  WBC 5.8 2.9*  HGB 11.1* 10.4*  HCT 33.2* 30.7*  MCV 91.7 91.1  PLT 130* 133*    Cardiac Enzymes: No results for input(s): CKTOTAL, CKMB, CKMBINDEX, TROPONINI in the last 168 hours.  BNP: Invalid input(s): POCBNP  CBG: No results for input(s): GLUCAP in the last 168 hours.  Microbiology: Results for orders placed or performed during the hospital encounter of 01/21/20  Resp Panel by RT-PCR (Flu A&B, Covid) Nasopharyngeal Swab     Status: None   Collection Time: 01/21/20  2:05 PM   Specimen: Nasopharyngeal Swab; Nasopharyngeal(NP) swabs in vial transport medium  Result Value Ref Range Status   SARS Coronavirus 2 by RT PCR NEGATIVE NEGATIVE Final     Comment: (NOTE) SARS-CoV-2 target nucleic acids are NOT DETECTED.  The SARS-CoV-2 RNA is generally detectable in upper respiratory specimens during the acute phase of infection. The lowest concentration of SARS-CoV-2 viral copies this assay can detect is 138 copies/mL. A negative result does not preclude SARS-Cov-2 infection and should not be used as the sole basis for treatment or other patient management decisions. A negative result may occur with  improper specimen collection/handling, submission of specimen other than nasopharyngeal swab, presence of viral mutation(s) within the areas targeted by this assay, and inadequate number of viral copies(<138 copies/mL). A negative result must be combined with clinical observations, patient history, and epidemiological information. The expected result is Negative.  Fact Sheet for Patients:  EntrepreneurPulse.com.au  Fact Sheet for Healthcare Providers:  IncredibleEmployment.be  This test is no t yet approved or cleared by the [place] FDA and  has been authorized for detection and/or diagnosis of SARS-CoV-2 by FDA under an Emergency Use Authorization (EUA). This  EUA will remain  in effect (meaning this test can be used) for the duration of the COVID-19 declaration under Section 564(b)(1) of the Act, 21 U.S.C.section 360bbb-3(b)(1), unless the authorization is terminated  or revoked sooner.       Influenza A by PCR NEGATIVE NEGATIVE Final   Influenza B by PCR NEGATIVE NEGATIVE Final    Comment: (NOTE) The Xpert Xpress SARS-CoV-2/FLU/RSV plus assay is intended as an aid in the diagnosis of influenza from Nasopharyngeal swab specimens and should not be used as a sole basis for treatment. Nasal washings and aspirates are unacceptable for Xpert Xpress SARS-CoV-2/FLU/RSV testing.  Fact Sheet for Patients: EntrepreneurPulse.com.au  Fact Sheet for Healthcare  Providers: IncredibleEmployment.be  This test is not yet approved or cleared by the [place] FDA and has been authorized for detection and/or diagnosis of SARS-CoV-2 by FDA under an Emergency Use Authorization (EUA). This EUA will remain in effect (meaning this test can be used) for the duration of the COVID-19 declaration under Section 564(b)(1) of the Act, 21 U.S.C. section 360bbb-3(b)(1), unless the authorization is terminated or revoked.  Performed at [facility], [address] [place], [place] [address]     Coagulation Studies: No results for input(s): LABPROT, INR in the last 72 hours.  Urinalysis: Recent Labs    01/21/20 1405  COLORURINE STRAW*  LABSPEC 1.009  PHURINE 5.0  GLUCOSEU NEGATIVE  HGBUR NEGATIVE  BILIRUBINUR NEGATIVE  KETONESUR NEGATIVE  PROTEINUR NEGATIVE  NITRITE NEGATIVE  LEUKOCYTESUR NEGATIVE      Imaging: CT ABDOMEN PELVIS WO CONTRAST  Result Date: 01/22/2020 CLINICAL DATA:  87 year old female with history of right upper tract urothelial carcinoma. EXAM: CT ABDOMEN AND PELVIS WITHOUT CONTRAST TECHNIQUE: Multidetector CT imaging of the abdomen and pelvis was performed following the standard protocol without IV contrast. COMPARISON:  CT the abdomen and pelvis 12/25/2018. FINDINGS: Lower chest: Small right and trace left pleural effusions. Cardiomegaly. Severe calcifications of the aortic valve. Atherosclerotic calcifications in the descending thoracic aorta. Pacemaker leads in the right atrium and right ventricle. Left-sided fat containing Bochdalek's hernia. Hepatobiliary: Liver has a shrunken appearance and nodular contour, suggesting underlying cirrhosis. Small focus of low attenuation in segment 4B adjacent to the falciform ligament, most compatible with an area of focal fatty infiltration. No other definite suspicious cystic or solid hepatic lesions are confidently identified on today's noncontrast CT examination.  Subcentimeter calcified gallstone lying dependently in the neck of the gallbladder. No findings to suggest an acute cholecystitis are noted at this time. Pancreas: No definite pancreatic mass or peripancreatic fluid collections or inflammatory changes noted on today's noncontrast CT examination. Spleen: Unremarkable. Adrenals/Urinary Tract: Severe atrophy of the left kidney. Multiple low-attenuation lesions in both kidneys, incompletely characterized on today's non-contrast CT examination, but statistically likely to represent cysts. Multiple tiny nonobstructive calculi within the right renal collecting system measuring 2-3 mm in size. In addition, there are several 2-3 mm calculi lying dependently in the distal third of the right ureter shortly before the right ureterovesicular junction, best appreciated on coronal image 53 of series 5. There is mild right-sided hydroureter and moderate right-sided hydronephrosis. This appears related to a intermediate attenuation structure at the level of the right ureterovesicular junction (axial image 74 of series 2 and coronal image 50 of series 5) which measures approximately 1.3 x 1.3 x 1.2 cm. In addition, there is some intermediate attenuation thickening of the structures in the right renal hilum, poorly demonstrated on today's noncontrast CT examination. These findings are concerning  for tumor. No left-sided hydroureteronephrosis. Bilateral adrenal glands are normal in appearance. Stomach/Bowel: Unenhanced appearance of the stomach is normal. There is no pathologic dilatation of small bowel or colon. Numerous colonic diverticulae are noted, without surrounding inflammatory changes to suggest an acute diverticulitis at this time. Status post appendectomy. Short segment of mid transverse colon extending into a small epigastric ventral hernia. Vascular/Lymphatic: Aortic atherosclerosis with postoperative changes of prior aorto bi-iliac bypass graft, poorly evaluated on  today's noncontrast CT examination. 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 No definite lymphadenopathy identified in the abdomen or pelvis. Reproductive: Uterus and ovaries are atrophic. Small calcifications within the uterus, likely reflective of calcified fibroids. Other: Postoperative changes of mesh repair for ventral hernia. Trace volume of ascites. No pneumoperitoneum. Musculoskeletal: There are no aggressive appearing lytic or blastic lesions noted in the visualized portions of the skeleton. IMPRESSION: 1. Moderate right hydronephrosis and mild right hydroureter with intermediate attenuation structure at the right ureterovesicular junction concerning for partially obstructing tumor (although thrombus could have a similar appearance). There is also probable tumor in the right renal collecting system poorly evaluated on today's noncontrast CT examination. 2. Multiple nonobstructive calculi in the right renal collecting system measuring 2-3 mm in size, as well as a cluster of small 2-3 mm calculi lying dependently in the dilated distal third of the right ureter. 3. Cirrhosis with trace volume of ascites. 4. Cardiomegaly with small right and trace left pleural effusions. 5. There are calcifications of the aortic valve. Echocardiographic correlation for evaluation of potential valvular dysfunction may be warranted if clinically indicated. 6. 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 7. Additional incidental findings, as above. Electronically Signed   By: Vinnie Langton M.D.   On: 01/22/2020 16:35   US RENAL  Result Date: 01/22/2020 CLINICAL DATA:  Acute kidney injury EXAM: RENAL / URINARY TRACT ULTRASOUND COMPLETE COMPARISON:   CT abdomen dated 12/25/2018 FINDINGS: Right Kidney: Renal measurements: 12.1 x 7.3 x 5.9 cm = volume: 274 mL. Cortical echogenicity is within normal limits. Multiple benign cysts. No suspicious mass is seen. Mild hydronephrosis. Left Kidney: Renal measurements: 7 x 3.9 x 3.9 cm = volume: 57 mL. Echogenic renal cortex. Multiple benign cysts. No suspicious mass is seen. No hydronephrosis. Bladder: Mass along the posterior bladder wall measures 2.1 cm, with internal vascularity, likely neoplastic. Bladder is otherwise unremarkable. No bladder stone is seen. Other: None. IMPRESSION: 1. Suspicious mass along the posterior bladder wall, measuring 2.1 cm, with internal blood flow, likely neoplastic. Consider cystoscopy for definitive characterization. 2. Mild RIGHT-sided hydronephrosis. 3. Bilateral renal cysts. No suspicious mass is seen within either kidney. However, soft tissue density mass has been described within the lower pole of the RIGHT renal collecting system on previous CT examinations dating back to 2017, suspicious for low-grade urothelial carcinoma. 4. Small echogenic LEFT kidney, corresponding to appearance on earlier CT of 12/25/2018, likely related to the previously described saccular aneurysm of the abdominal aorta which originates at or immediately adjacent to the expected origin of the LEFT renal artery causing chronic renal insufficiency. Electronically Signed   By: Franki Cabot M.D.   On: 01/22/2020 08:54      Assessment & Plan: Pt is a 87 y.o. female  with a PMHx of atrophic left kidney, right low-grade upper  tract urothelial cell carcinoma managed with watchful waiting, nephrolithiasis, chronic kidney disease stage IIIb baseline EGFR 34, abdominal aortic aneurysm, complete heart block status post pacemaker placement, COPD, hypertension, hyperlipidemia who was admitted to [facility] on 01/21/2020 for evaluation of shortness of breath.  She has complex urologic history and is actively followed by  urology.  As above she has history of large papillary upper tract mass in prior biopsy was consistent with low-grade noninvasive urothelial cell carcinoma.  She previously declined management as this would likely have left her dialysis dependent.  1.  Acute kidney injury/chronic kidney disease stage IIIb/atrophic left kidney/right-sided hydronephrosis.  Patient with complex urologic history as summarized above.  Urology is planning for temporizing measures to help drain the right kidney tomorrow.  Hopefully this will be successful and patient will regain some renal function as her EGFR is currently quite low.  Family appears to be hesitant to proceed with renal placement therapy if she were not to regain kidney function.  This is certainly reasonable given her underlying comorbidities.  We will continue to monitor renal parameters in response to proposed urologic intervention.  For now otherwise continue supportive care.  2.  Anemia of chronic kidney disease.  Hemoglobin 10.4.  No indication for Epogen at this time.  3.  Thanks for consultation.

## 2020-01-24 ENCOUNTER — Encounter
Admission: EM | Disposition: A | Discharge status: Home Health | Payer: Self-pay | Source: Home / Self Care | Attending: Internal Medicine

## 2020-01-24 ENCOUNTER — Inpatient Hospital Stay: Payer: Medicare Other | Admitting: Registered Nurse

## 2020-01-24 ENCOUNTER — Inpatient Hospital Stay: Payer: Medicare Other

## 2020-01-24 DIAGNOSIS — N179 Acute kidney failure, unspecified: Secondary | ICD-10-CM | POA: Diagnosis not present

## 2020-01-24 DIAGNOSIS — J441 Chronic obstructive pulmonary disease with (acute) exacerbation: Secondary | ICD-10-CM | POA: Diagnosis not present

## 2020-01-24 DIAGNOSIS — I4891 Unspecified atrial fibrillation: Secondary | ICD-10-CM | POA: Diagnosis not present

## 2020-01-24 DIAGNOSIS — I10 Essential (primary) hypertension: Secondary | ICD-10-CM | POA: Diagnosis not present

## 2020-01-24 HISTORY — PX: CYSTOSCOPY/RETROGRADE/URETEROSCOPY/STONE EXTRACTION WITH BASKET: SHX5317

## 2020-01-24 HISTORY — PX: TRANSURETHRAL RESECTION OF BLADDER TUMOR: SHX2575

## 2020-01-24 LAB — BASIC METABOLIC PANEL
Anion gap: 12 (ref 5–15)
BUN: 65 mg/dL — ABNORMAL HIGH (ref 8–23)
CO2: 17 mmol/L — ABNORMAL LOW (ref 22–32)
Calcium: 8.6 mg/dL — ABNORMAL LOW (ref 8.9–10.3)
Chloride: 114 mmol/L — ABNORMAL HIGH (ref 98–111)
Creatinine, Ser: 2.99 mg/dL — ABNORMAL HIGH (ref 0.44–1.00)
GFR, Estimated: 15 mL/min — ABNORMAL LOW (ref >60)
Glucose, Bld: 91 mg/dL (ref 70–99)
Potassium: 3.7 mmol/L (ref 3.5–5.1)
Sodium: 143 mmol/L (ref 135–145)

## 2020-01-24 LAB — SURGICAL PCR SCREEN
MRSA, PCR: NEGATIVE
Staphylococcus aureus: NEGATIVE

## 2020-01-24 SURGERY — TURBT (TRANSURETHRAL RESECTION OF BLADDER TUMOR)
Anesthesia: General | Site: Ureter | Laterality: Right

## 2020-01-24 MED ORDER — DEXAMETHASONE SODIUM PHOSPHATE 10 MG/ML IJ SOLN
INTRAMUSCULAR | Status: AC
  Filled 2020-01-24: qty 1

## 2020-01-24 MED ORDER — KETAMINE HCL 50 MG/5ML IJ SOSY
PREFILLED_SYRINGE | INTRAMUSCULAR | Status: AC
  Filled 2020-01-24: qty 5

## 2020-01-24 MED ORDER — SEVOFLURANE IN SOLN
RESPIRATORY_TRACT | Status: AC
  Filled 2020-01-24: qty 250

## 2020-01-24 MED ORDER — FENTANYL CITRATE (PF) 100 MCG/2ML IJ SOLN
INTRAMUSCULAR | Status: AC
  Filled 2020-01-24: qty 2

## 2020-01-24 MED ORDER — DEXAMETHASONE SODIUM PHOSPHATE 10 MG/ML IJ SOLN
INTRAMUSCULAR | Status: DC | PRN
  Administered 2020-01-24: 6 mg via INTRAVENOUS

## 2020-01-24 MED ORDER — CEFAZOLIN SODIUM-DEXTROSE 1-4 GM/50ML-% IV SOLN
INTRAVENOUS | Status: DC | PRN
  Administered 2020-01-24: 1 g via INTRAVENOUS

## 2020-01-24 MED ORDER — FENTANYL CITRATE (PF) 100 MCG/2ML IJ SOLN: 25.0000 ug | INTRAMUSCULAR | Status: DC | PRN

## 2020-01-24 MED ORDER — KETAMINE HCL 50 MG/ML IJ SOLN
INTRAMUSCULAR | Status: DC | PRN
  Administered 2020-01-24: 20 mg via INTRAVENOUS

## 2020-01-24 MED ORDER — METHYLPREDNISOLONE SODIUM SUCC 125 MG IJ SOLR
60.0000 mg | INTRAMUSCULAR | Status: DC
  Administered 2020-01-25: 60 mg via INTRAVENOUS
  Filled 2020-01-24: qty 2

## 2020-01-24 MED ORDER — IOHEXOL 180 MG/ML  SOLN
INTRAMUSCULAR | Status: DC | PRN
  Administered 2020-01-24: 40 mL

## 2020-01-24 MED ORDER — METHYLPREDNISOLONE SODIUM SUCC 125 MG IJ SOLR
INTRAMUSCULAR | Status: AC
  Filled 2020-01-24: qty 2

## 2020-01-24 MED ORDER — MUPIROCIN 2 % EX OINT
1.0000 "application " | TOPICAL_OINTMENT | Freq: Two times a day (BID) | CUTANEOUS | Status: DC
  Filled 2020-01-24: qty 22

## 2020-01-24 MED ORDER — ONDANSETRON HCL 4 MG/2ML IJ SOLN
INTRAMUSCULAR | Status: AC
  Filled 2020-01-24: qty 2

## 2020-01-24 MED ORDER — LIDOCAINE HCL (PF) 2 % IJ SOLN
INTRAMUSCULAR | Status: AC
  Filled 2020-01-24: qty 5

## 2020-01-24 MED ORDER — FENTANYL CITRATE (PF) 100 MCG/2ML IJ SOLN
INTRAMUSCULAR | Status: DC | PRN
  Administered 2020-01-24 (×3): 25 ug via INTRAVENOUS

## 2020-01-24 MED ORDER — ONDANSETRON HCL 4 MG/2ML IJ SOLN: 4.0000 mg | Freq: Once | INTRAMUSCULAR | Status: DC | PRN

## 2020-01-24 MED ORDER — ONDANSETRON HCL 4 MG/2ML IJ SOLN
INTRAMUSCULAR | Status: DC | PRN
  Administered 2020-01-24: 4 mg via INTRAVENOUS

## 2020-01-24 MED ORDER — LORAZEPAM 2 MG/ML IJ SOLN
2.0000 mg | Freq: Once | INTRAMUSCULAR | Status: AC
  Administered 2020-01-24: 2 mg via INTRAVENOUS
  Filled 2020-01-24: qty 1

## 2020-01-24 MED ORDER — CEFAZOLIN SODIUM 1 G IJ SOLR
INTRAMUSCULAR | Status: AC
  Filled 2020-01-24: qty 10

## 2020-01-24 MED ORDER — PROPOFOL 10 MG/ML IV BOLUS
INTRAVENOUS | Status: DC | PRN
  Administered 2020-01-24: 80 mg via INTRAVENOUS

## 2020-01-24 MED ORDER — LIDOCAINE HCL (CARDIAC) PF 100 MG/5ML IV SOSY
PREFILLED_SYRINGE | INTRAVENOUS | Status: DC | PRN
  Administered 2020-01-24: 60 mg via INTRAVENOUS

## 2020-01-24 MED ORDER — PROPOFOL 10 MG/ML IV BOLUS
INTRAVENOUS | Status: AC
  Filled 2020-01-24: qty 20

## 2020-01-24 MED ORDER — PHENYLEPHRINE HCL (PRESSORS) 10 MG/ML IV SOLN
INTRAVENOUS | Status: DC | PRN
  Administered 2020-01-24: 100 ug via INTRAVENOUS

## 2020-01-24 SURGICAL SUPPLY — 31 items
BAG DRAIN CYSTO-URO LG1000N (MISCELLANEOUS) ×4 IMPLANT
BAG URINE DRAIN 2000ML AR STRL (UROLOGICAL SUPPLIES) ×4 IMPLANT
BASKET ZERO TIP 1.9FR (BASKET) ×2 IMPLANT
BRUSH SCRUB EZ  4% CHG (MISCELLANEOUS) ×1
BRUSH SCRUB EZ 4% CHG (MISCELLANEOUS) ×3 IMPLANT
CATH FOL 2WAY LX 18X30 (CATHETERS) ×4 IMPLANT
DRAPE UTILITY 15X26 TOWEL STRL (DRAPES) ×4 IMPLANT
DRSG TELFA 4X3 1S NADH ST (GAUZE/BANDAGES/DRESSINGS) ×4 IMPLANT
ELECT LOOP 22F BIPOLAR SML (ELECTROSURGICAL) ×4
ELECT REM PT RETURN 9FT ADLT (ELECTROSURGICAL)
ELECTRODE LOOP 22F BIPOLAR SML (ELECTROSURGICAL) ×1 IMPLANT
ELECTRODE REM PT RTRN 9FT ADLT (ELECTROSURGICAL) IMPLANT
GLOVE BIOGEL PI IND STRL 7.5 (GLOVE) ×3 IMPLANT
GLOVE BIOGEL PI INDICATOR 7.5 (GLOVE) ×1
GOWN STRL REUS W/ TWL LRG LVL3 (GOWN DISPOSABLE) ×3 IMPLANT
GOWN STRL REUS W/ TWL XL LVL3 (GOWN DISPOSABLE) ×3 IMPLANT
GOWN STRL REUS W/TWL LRG LVL3 (GOWN DISPOSABLE) ×1
GOWN STRL REUS W/TWL XL LVL3 (GOWN DISPOSABLE) ×1
KIT TURNOVER CYSTO (KITS) ×4 IMPLANT
LOOP CUT BIPOLAR 24F LRG (ELECTROSURGICAL) IMPLANT
MANIFOLD NEPTUNE II (INSTRUMENTS) ×4 IMPLANT
NDL SAFETY ECLIPSE 18X1.5 (NEEDLE) ×3 IMPLANT
NEEDLE HYPO 18GX1.5 SHARP (NEEDLE) ×1
PACK CYSTO AR (MISCELLANEOUS) ×4 IMPLANT
PAD ARMBOARD 7.5X6 YLW CONV (MISCELLANEOUS) ×4 IMPLANT
SET IRRIG Y TYPE TUR BLADDER L (SET/KITS/TRAYS/PACK) ×4 IMPLANT
STENT URO INLAY 6FRX24CM (STENTS) ×2 IMPLANT
SURGILUBE 2OZ TUBE FLIPTOP (MISCELLANEOUS) ×4 IMPLANT
SYR TOOMEY IRRIG 70ML (MISCELLANEOUS) ×4
SYRINGE TOOMEY IRRIG 70ML (MISCELLANEOUS) ×3 IMPLANT
WATER STERILE IRR 1000ML POUR (IV SOLUTION) ×4 IMPLANT

## 2020-01-24 NOTE — Op Note (Signed)
Date of procedure: 01/24/20  Preoperative diagnosis:  1. Right hydronephrosis 2. Right upper tract urothelial cell carcinoma 3. Bladder tumor 4. Right distal ureteral stones  Postoperative diagnosis:  1. Same  Procedure: 1. Transurethral resection of bladder tumor, 1.5 cm 2. Right ureteroscopy, basket extraction of ureteral stones 3. Right ureteroscopy, basket removal of distal ureteral tumor 4. Cystoscopy, right retrograde pyelogram with intraoperative interpretation, right ureteral stent placement  Surgeon: Nickolas Madrid, MD  Anesthesia: General  Complications: None  Intraoperative findings:  1.  1.5 cm papillary tumor over the right ureteral orifice with tumor extruding from the ureter, 5 mm papillary lesion at posterior bladder wall.  Both resected. 2.  Bulky papillary tumor in the right distal ureter, ~75% basket extracted on ureteroscopy 3.  Basket extraction of 3 small distal ureteral stones 4.  Uncomplicated stent placement  EBL: Minimal  Specimens:  1.  Stone for analysis 2.  Right distal ureteral tumor 3.  Bladder tumor  Drains: Right 6 French by 24 cm Bard Optima stent  Indication: Jessica [name] is a 87 y.o. 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  She presented with right hydronephrosis, right distal ureteral stones, new right distal ureteral tumor, bladder tumor, and renal failure and after a long conversation opted for intervention.    After reviewing the management options for treatment, they elected to proceed with the above surgical procedure(s). We have discussed the potential benefits and risks of the procedure, side effects of the proposed treatment, the likelihood of the patient achieving the goals of the procedure, and any potential problems that might occur during the procedure or  recuperation. Informed consent has been obtained.  Description of procedure:  The patient was taken to the operating room and general anesthesia was induced. SCDs were placed for DVT prophylaxis. The patient was placed in the dorsal lithotomy position, prepped and draped in the usual sterile fashion, and preoperative antibiotics(Ancef) were administered. A preoperative time-out was performed.   A 21 French rigid cystoscope was used to intubate the urethra and thorough cystoscopy was performed.  There was a 1.5 cm tumor that appeared to be emanating from the superior aspect of the right ureteral orifice, as well as a 5 mm papillary lesion at the posterior bladder wall.  I was able to navigate a sensor wire alongside the tumor into the right ureteral orifice and this advanced easily up to the kidney under fluoroscopic vision.  The wire was secured.  A semirigid ureteroscope was then advanced alongside the wire into the distal ureter and there were multiple papillary tumors protruding into the lumen.  Just past the tumors, I visualized three ~3-53mm yellow stones in the ureter, and these were all basket extracted and removed.  The semirigid ureteroscope was readvanced into the right distal ureter, and using the basket I was able to debulk the 2 largest papillary tumors in the distal ureter, and these were sent for analysis.  A retrograde pyelogram was performed through the scope and showed some residual filling defects in the distal ureter consistent with remaining papillary tumor, as well as in the lower pole consistent with her known 3 cm upper tract mass.  There was moderate hydronephrosis.  The rigid cystoscope was backloaded over the wire and a 6 [place] by 24 cm Bard Optima stent was placed with an excellent curl in the upper pole, as well as  under direct vision the bladder.  There is brisk drainage of contrast through the side ports of the stent.  The visual obturator was then used to place the 26  [place] resectoscope, and the 1.5 cm bladder tumor was resected down to the base and meticulous hemostasis was achieved.  The small tumor at the posterior wall was fulgurated.  This specimen was sent as bladder tumor.  Thorough cystoscopy revealed excellent hemostasis and no other lesions.  The stent remained in excellent position.  The bladder was drained and this concluded our procedure.  Disposition: Stable to PACU  Plan: Would hold anticoagulation at least 5 days, ideally indefinitely with her known upper tract disease and plan for lifelong stent exchanges Follow-up with urology in 2-3 weeks for symptom check and discuss pathology Will need lifelong stent changes every 9 to 12 months  Nickolas Madrid, MD

## 2020-01-24 NOTE — Transfer of Care (Signed)
Immediate Anesthesia Transfer of Care Note  Patient: JENEAN ESCANDON  Procedure(s) Performed: TRANSURETHRAL RESECTION OF BLADDER TUMOR (TURBT) (N/A Bladder) CYSTOSCOPY/RETROGRADE/URETEROSCOPY/STONE EXTRACTION WITH BASKETRIGHT STENT PLACEMENT (Right Ureter)  Patient Location: PACU  Anesthesia Type:General  Level of Consciousness: drowsy  Airway & Oxygen Therapy: Patient Spontanous Breathing and Patient connected to face mask oxygen  Post-op Assessment: Report given to RN and Post -op Vital signs reviewed and stable  Post vital signs: Reviewed and stable  Last Vitals:  Vitals Value Taken Time  BP 128/71 01/24/20 1210  Temp    Pulse 69 01/24/20 1211  Resp 14 01/24/20 1211  SpO2 99 % 01/24/20 1211  Vitals shown include unvalidated device data.  Last Pain:  Vitals:   01/24/20 0819  TempSrc: Oral  PainSc: 0-No pain         Complications: No complications documented.

## 2020-01-24 NOTE — Progress Notes (Signed)
[facility] NAME: Jessica [name]    MR#:  [id number]  DATE OF BIRTH:  1929-09-17  SUBJECTIVE:   Patient came in with increasing shortness of breath and generalized weakness. She was found to have elevated creatinine of 3.48. Baseline creatinine 1.5.  Patient was a bit anxious earlier prior to going for procedure. She tolerated procedure well. Denies any abdominal pain. No chest pain.  REVIEW OF SYSTEMS:   Review of Systems  Constitutional: Negative for chills, fever and weight loss.  HENT: Negative for ear discharge, ear pain and nosebleeds.   Eyes: Negative for blurred vision, pain and discharge.  Respiratory: Negative for sputum production, wheezing and stridor.   Cardiovascular: Negative for chest pain, palpitations, orthopnea and PND.  Gastrointestinal: Negative for abdominal pain, diarrhea, nausea and vomiting.  Genitourinary: Negative for frequency and urgency.  Musculoskeletal: Negative for back pain and joint pain.  Neurological: Positive for weakness. Negative for sensory change, speech change and focal weakness.  Psychiatric/Behavioral: Negative for depression and hallucinations. The patient is nervous/anxious.    Tolerating Diet:yes Tolerating PT: pending  DRUG ALLERGIES:   Allergies  Allergen Reactions  . Codeine Other (See Comments)    Made her feel crazy  . Morphine Other (See Comments)    Made her feel crazy  . Zyrtec [Cetirizine] Other (See Comments)    Causes facial numbness    VITALS:  Blood pressure (!) 158/69, pulse 80, temperature 97.7 F (36.5 C), temperature source Oral, resp. rate 18, height 5\' 7"  (1.702 m), weight 61.2 kg, SpO2 94 %.  PHYSICAL EXAMINATION:   Physical Exam  GENERAL:  87 y.o.-year-old patient lying in the bed with no acute distress.   LUNGS:decreased breath sounds bilaterally, no wheezing, rales, rhonchi. No use of accessory muscles of respiration.  CARDIOVASCULAR: S1, S2  normal. No murmurs, rubs, or gallops.  ABDOMEN: Soft, nontender, nondistended. Bowel sounds present. No organomegaly or mass.  EXTREMITIES: No cyanosis, clubbing or edema b/l.    NEUROLOGIC: Cranial nerves II through XII are intact. No focal Motor or sensory deficits b/l.   PSYCHIATRIC:  patient is alert and oriented x 3.  SKIN: No obvious rash, lesion, or ulcer.   LABORATORY PANEL:  CBC Recent Labs  Lab 01/22/20 0609  WBC 2.9*  HGB 10.4*  HCT 30.7*  PLT 133*    Chemistries  Recent Labs  Lab 01/21/20 1404 01/22/20 0609 01/24/20 0422  NA  --    < > 143  K  --    < > 3.7  CL  --    < > 114*  CO2  --    < > 17*  GLUCOSE  --    < > 91  BUN  --    < > 65*  CREATININE  --    < > 2.99*  CALCIUM  --    < > 8.6*  MG 2.0  --   --    < > = values in this interval not displayed.   Cardiac Enzymes No results for input(s): TROPONINI in the last 168 hours. RADIOLOGY:  CT ABDOMEN PELVIS WO CONTRAST  Result Date: 01/22/2020 CLINICAL DATA:  87 year old female with history of right upper tract urothelial carcinoma. EXAM: CT ABDOMEN AND PELVIS WITHOUT CONTRAST TECHNIQUE: Multidetector CT imaging of the abdomen and pelvis was performed following the standard protocol without IV contrast. COMPARISON:  CT the abdomen and pelvis 12/25/2018. FINDINGS: Lower chest: Small right and trace left pleural  effusions. Cardiomegaly. Severe calcifications of the aortic valve. Atherosclerotic calcifications in the descending thoracic aorta. Pacemaker leads in the right atrium and right ventricle. Left-sided fat containing Bochdalek's hernia. Hepatobiliary: Liver has a shrunken appearance and nodular contour, suggesting underlying cirrhosis. Small focus of low attenuation in segment 4B adjacent to the falciform ligament, most compatible with an area of focal fatty infiltration. No other definite suspicious cystic or solid hepatic lesions are confidently identified on today's noncontrast CT examination.  Subcentimeter calcified gallstone lying dependently in the neck of the gallbladder. No findings to suggest an acute cholecystitis are noted at this time. Pancreas: No definite pancreatic mass or peripancreatic fluid collections or inflammatory changes noted on today's noncontrast CT examination. Spleen: Unremarkable. Adrenals/Urinary Tract: Severe atrophy of the left kidney. Multiple low-attenuation lesions in both kidneys, incompletely characterized on today's non-contrast CT examination, but statistically likely to represent cysts. Multiple tiny nonobstructive calculi within the right renal collecting system measuring 2-3 mm in size. In addition, there are several 2-3 mm calculi lying dependently in the distal third of the right ureter shortly before the right ureterovesicular junction, best appreciated on coronal image 53 of series 5. There is mild right-sided hydroureter and moderate right-sided hydronephrosis. This appears related to a intermediate attenuation structure at the level of the right ureterovesicular junction (axial image 74 of series 2 and coronal image 50 of series 5) which measures approximately 1.3 x 1.3 x 1.2 cm. In addition, there is some intermediate attenuation thickening of the structures in the right renal hilum, poorly demonstrated on today's noncontrast CT examination. These findings are concerning for tumor. No left-sided hydroureteronephrosis. Bilateral adrenal glands are normal in appearance. Stomach/Bowel: Unenhanced appearance of the stomach is normal. There is no pathologic dilatation of small bowel or colon. Numerous colonic diverticulae are noted, without surrounding inflammatory changes to suggest an acute diverticulitis at this time. Status post appendectomy. Short segment of mid transverse colon extending into a small epigastric ventral hernia. Vascular/Lymphatic: Aortic atherosclerosis with postoperative changes of prior aorto bi-iliac bypass graft, poorly evaluated on  today's noncontrast CT examination. 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 No definite lymphadenopathy identified in the abdomen or pelvis. Reproductive: Uterus and ovaries are atrophic. Small calcifications within the uterus, likely reflective of calcified fibroids. Other: Postoperative changes of mesh repair for ventral hernia. Trace volume of ascites. No pneumoperitoneum. Musculoskeletal: There are no aggressive appearing lytic or blastic lesions noted in the visualized portions of the skeleton. IMPRESSION: 1. Moderate right hydronephrosis and mild right hydroureter with intermediate attenuation structure at the right ureterovesicular junction concerning for partially obstructing tumor (although thrombus could have a similar appearance). There is also probable tumor in the right renal collecting system poorly evaluated on today's noncontrast CT examination. 2. Multiple nonobstructive calculi in the right renal collecting system measuring 2-3 mm in size, as well as a cluster of small 2-3 mm calculi lying dependently in the dilated distal third of the right ureter. 3. Cirrhosis with trace volume of ascites. 4. Cardiomegaly with small right and trace left pleural effusions. 5. There are calcifications of the aortic valve. Echocardiographic correlation for evaluation of potential valvular dysfunction may be warranted if clinically indicated. 6. 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 7. Additional incidental findings, as above. Electronically Signed   By: Quillian Quince  Entrikin M.D.   On: 01/22/2020 16:35   DG OR UROLOGY CYSTO IMAGE ([facility] ONLY)  Result Date: 01/24/2020 There is no interpretation for this exam.  This order is for images  obtained during a surgical procedure.  Please See "Surgeries" Tab for more information regarding the procedure.   ASSESSMENT AND PLAN:  GRAZIELLA CONNERY is a 87 y.o. female with medical history significant of hypertension, hyperlipidemia, COPD, gout, depression, rectal bleeding, pacemaker placement, atrial fibrillation on Eliquis, urothelial cancer, CKD-stage III, who presents with shortness of breath.  Acute renal failure superimposed on stage 3a chronic kidney disease ([place]): Baseline Cre is 1.3, pt's Cre is 3.87 and BUN 59 on admission.  - Etiology is not clear. Likely due to dehydration and continuation of diuretics. H/o Uroeithelial mass right kidney with hydronephrosis and new [place]addr mass noted (seeding from renal mass) -- Dr. Holley Raring of renal is consulted --Urology consult with Dr Diamantina Providence -- recommends to hold eliquis  --01/23/2020-- plans for cystoscopy, TURBT, right ureteroscopy, laser lithotripsy, and attempted stent placement on sunday - Avoid using renal toxic medications, hypotension and contrast dye (or carefully use) --Creat 3.93--3.48--2.99 -cont IVF --01/24/20--s/p Transurethral resection of bladder tumor, 1.5 cm, Right ureteroscopy, basket extraction of ureteral stones, Right ureteroscopy, basket removal of distal ureteral tumor, Cystoscopy, right retrograde pyelogram with intraoperative interpretation, right ureteral stent placement --hold Eliquis for now atleast 5-7 days  COPD exacerbation [facility]): Patient has short of breath, mild wheezing on right side of the lung, indicating COPD exacerbation.  No oxygen desaturation. -Bronchodilators -Solu-Medrol 40 mg IV bid--now d/ced since no wheezing and pt on RA -Mucinex for cough  -Incentive spirometry - prn Nasal cannula oxygen as neededto maintain O2 saturation 93% or greater -weaned to RA--93% on RA  HYPERTENSION, BENIGN -IV hydralazine as needed -Continue amlodipine, atenolol  Atrial fibrillation (HCC) -(On hold) Eliquis   and cont atenolol --Urology recommends holding eliquis for 5-7 days.   Hyperlipidemia -Crestor  Hypokalemia: K= 3.3 on admission. - Repleted K 4.2 - Mg level-2.0   DVT ppx: Eliquis (on hold for anticipated Urological procedure) Code Status: DNR per her son who is POA Family Communication: n Yes, patient's son in the room Disposition Plan:  Anticipate discharge back to previous ALF environment Consults called:  Dr. Holley Raring of renal, Dr Kathleene Hazel Admission status: Med-surg bed   Status is: Inpatient  Remains inpatient appropriate because:Inpatient level of care appropriate due to severity of illness   Dispo: The patient is from: ALF              Anticipated d/c is to: ALF              Anticipated d/c date is: 1-2 days              Patient currently is not medically stable to d/c. patient is status post urology code procedure. Will need to continue to monitor her creatinine hopefully discharge in 1 to 2 days. PT eval pending.      TOTAL TIME TAKING CARE OF THIS PATIENT: 25 minutes.  >50% time spent on counselling and coordination of care  Note: This dictation was prepared with Dragon dictation along with smaller phrase technology. Any transcriptional errors that result from this process are unintentional.  Fritzi Mandes M.D    [facility]   CC: Primary care physician; [facility], Doctors MakingPatient ID: Jessica [name], female   DOB: 1929/02/14, 87 y.o.   MRN: [id number]

## 2020-01-24 NOTE — Progress Notes (Signed)
PT Cancellation Note  Patient Details Name: TEESHA OHM MRN: [id number] DOB: 07-28-1929   Cancelled Treatment:    Reason Eval/Treat Not Completed: Patient at procedure or test/unavailable (patient is undergoing procedure. will re-attempt at later date/time as appropriate. will need new PT order or continue at transfer order following procedure.)   Everlean Alstrom. Graylon Good, PT, DPT 01/24/20, 11:29 AM

## 2020-01-24 NOTE — Plan of Care (Signed)
Continuing with plan of care. 

## 2020-01-24 NOTE — Interval H&P Note (Signed)
UROLOGY H&P UPDATE  Agree with prior H&P dated 01/23/2020.  Cardiac: Irregularly irregular Lungs: CTA bilaterally, decreased at bases  Laterality: Right Procedure: Cystoscopy, transurethral resection of bladder tumor, right ureteroscopy, laser lithotripsy, and stent placement  Urine: 0-5 RBCs, 6-10 WBCs, no bacteria, nitrite negative, no leukocytes  Informed consent obtained, we specifically discussed the risks of bleeding, infection, post-operative pain, ureteral injury, possible inability to access the collecting system in place stent secondary to tumor, and need for likely chronic stent and long-term stent exchanges, and need for additional procedures.  Billey Co, MD 01/24/2020

## 2020-01-24 NOTE — Progress Notes (Signed)
[facility]  ROUNDING NOTE   Subjective:  Patient underwent transurethral resection of the bladder tumor which was found to be 1.5 cm. She subsequently underwent right ureteroscopy with extraction of ureteral stones and then removal of distal ureteral tumor. Right ureteral stent was subsequently placed.   Objective:  Vital signs in last 24 hours:  Temp:  [97 F (36.1 C)-98.1 F (36.7 C)] 97.7 F (36.5 C) (01/02 1310) Pulse Rate:  [69-80] 80 (01/02 1310) Resp:  [14-24] 18 (01/02 1310) BP: (128-158)/(54-85) 158/69 (01/02 1310) SpO2:  [87 %-98 %] 87 % (01/02 1352)  Weight change:  Filed Weights   01/21/20 0918  Weight: 61.2 kg    Intake/Output: I/O last 3 completed shifts: In: 567 [I.V.:567] Out: 400 [Urine:400]   Intake/Output this shift:  Total I/O In: 450 [I.V.:450] Out: -   Physical Exam: General:  No acute distress  Head:  Normocephalic, atraumatic. Moist oral mucosal membranes  Eyes:  Anicteric  Neck:  Supple  Lungs:   Clear to auscultation, normal effort  Heart:  S1S2 no rubs  Abdomen:   Soft, nontender, bowel sounds present  Extremities:  Trace peripheral edema.  Neurologic:  Awake, alert, following commands  Skin:  No lesions  Access:  No dialysis access    Basic Metabolic Panel: Recent Labs  Lab 01/21/20 0843 01/21/20 1404 01/22/20 0609 01/24/20 0422  NA 141  --  142 143  K 3.3*  --  4.2 3.7  CL 108  --  115* 114*  CO2 21*  --  19* 17*  GLUCOSE 114*  --  135* 91  BUN 59*  --  61* 65*  CREATININE 3.87*  --  3.48* 2.99*  CALCIUM 8.8*  --  8.2* 8.6*  MG  --  2.0  --   --     Liver Function Tests: No results for input(s): AST, ALT, ALKPHOS, BILITOT, PROT, ALBUMIN in the last 168 hours. No results for input(s): LIPASE, AMYLASE in the last 168 hours. No results for input(s): AMMONIA in the last 168 hours.  CBC: Recent Labs  Lab 01/21/20 0843 01/22/20 0609  WBC 5.8 2.9*  HGB 11.1* 10.4*  HCT 33.2* 30.7*  MCV 91.7 91.1   PLT 130* 133*    Cardiac Enzymes: No results for input(s): CKTOTAL, CKMB, CKMBINDEX, TROPONINI in the last 168 hours.  BNP: Invalid input(s): POCBNP  CBG: No results for input(s): GLUCAP in the last 168 hours.  Microbiology: Results for orders placed or performed during the hospital encounter of 01/21/20  Resp Panel by RT-PCR (Flu A&B, Covid) Nasopharyngeal Swab     Status: None   Collection Time: 01/21/20  2:05 PM   Specimen: Nasopharyngeal Swab; Nasopharyngeal(NP) swabs in vial transport medium  Result Value Ref Range Status   SARS Coronavirus 2 by RT PCR NEGATIVE NEGATIVE Final    Comment: (NOTE) SARS-CoV-2 target nucleic acids are NOT DETECTED.  The SARS-CoV-2 RNA is generally detectable in upper respiratory specimens during the acute phase of infection. The lowest concentration of SARS-CoV-2 viral copies this assay can detect is 138 copies/mL. A negative result does not preclude SARS-Cov-2 infection and should not be used as the sole basis for treatment or other patient management decisions. A negative result may occur with  improper specimen collection/handling, submission of specimen other than nasopharyngeal swab, presence of viral mutation(s) within the areas targeted by this assay, and inadequate number of viral copies(<138 copies/mL). A negative result must be combined with clinical observations, patient history, and epidemiological  information. The expected result is Negative.  Fact Sheet for Patients:  EntrepreneurPulse.com.au  Fact Sheet for Healthcare Providers:  IncredibleEmployment.be  This test is no t yet approved or cleared by the [place] FDA and  has been authorized for detection and/or diagnosis of SARS-CoV-2 by FDA under an Emergency Use Authorization (EUA). This EUA will remain  in effect (meaning this test can be used) for the duration of the COVID-19 declaration under Section 564(b)(1) of the Act,  21 U.S.C.section 360bbb-3(b)(1), unless the authorization is terminated  or revoked sooner.       Influenza A by PCR NEGATIVE NEGATIVE Final   Influenza B by PCR NEGATIVE NEGATIVE Final    Comment: (NOTE) The Xpert Xpress SARS-CoV-2/FLU/RSV plus assay is intended as an aid in the diagnosis of influenza from Nasopharyngeal swab specimens and should not be used as a sole basis for treatment. Nasal washings and aspirates are unacceptable for Xpert Xpress SARS-CoV-2/FLU/RSV testing.  Fact Sheet for Patients: EntrepreneurPulse.com.au  Fact Sheet for Healthcare Providers: IncredibleEmployment.be  This test is not yet approved or cleared by the [place] FDA and has been authorized for detection and/or diagnosis of SARS-CoV-2 by FDA under an Emergency Use Authorization (EUA). This EUA will remain in effect (meaning this test can be used) for the duration of the COVID-19 declaration under Section 564(b)(1) of the Act, 21 U.S.C. section 360bbb-3(b)(1), unless the authorization is terminated or revoked.  Performed at [facility], [address], [place], [place] [address]   Surgical PCR screen     Status: None   Collection Time: 01/24/20  7:47 AM   Specimen: Nasal Mucosa; Nasal Swab  Result Value Ref Range Status   MRSA, PCR NEGATIVE NEGATIVE Final   Staphylococcus aureus NEGATIVE NEGATIVE Final    Comment: (NOTE) The Xpert SA Assay (FDA approved for NASAL specimens in patients 3 years of age and older), is one component of a comprehensive surveillance program. It is not intended to diagnose infection nor to guide or monitor treatment. Performed at [facility], [address] [place], [place] [address]     Coagulation Studies: No results for input(s): LABPROT, INR in the last 72 hours.  Urinalysis: No results for input(s): COLORURINE, LABSPEC, PHURINE, GLUCOSEU, HGBUR, BILIRUBINUR, KETONESUR, PROTEINUR,  UROBILINOGEN, NITRITE, LEUKOCYTESUR in the last 72 hours.  Invalid input(s): APPERANCEUR    Imaging: CT ABDOMEN PELVIS WO CONTRAST  Result Date: 01/22/2020 CLINICAL DATA:  87 year old female with history of right upper tract urothelial carcinoma. EXAM: CT ABDOMEN AND PELVIS WITHOUT CONTRAST TECHNIQUE: Multidetector CT imaging of the abdomen and pelvis was performed following the standard protocol without IV contrast. COMPARISON:  CT the abdomen and pelvis 12/25/2018. FINDINGS: Lower chest: Small right and trace left pleural effusions. Cardiomegaly. Severe calcifications of the aortic valve. Atherosclerotic calcifications in the descending thoracic aorta. Pacemaker leads in the right atrium and right ventricle. Left-sided fat containing Bochdalek's hernia. Hepatobiliary: Liver has a shrunken appearance and nodular contour, suggesting underlying cirrhosis. Small focus of low attenuation in segment 4B adjacent to the falciform ligament, most compatible with an area of focal fatty infiltration. No other definite suspicious cystic or solid hepatic lesions are confidently identified on today's noncontrast CT examination. Subcentimeter calcified gallstone lying dependently in the neck of the gallbladder. No findings to suggest an acute cholecystitis are noted at this time. Pancreas: No definite pancreatic mass or peripancreatic fluid collections or inflammatory changes noted on today's noncontrast CT examination. Spleen: Unremarkable. Adrenals/Urinary Tract: Severe atrophy of the left  kidney. Multiple low-attenuation lesions in both kidneys, incompletely characterized on today's non-contrast CT examination, but statistically likely to represent cysts. Multiple tiny nonobstructive calculi within the right renal collecting system measuring 2-3 mm in size. In addition, there are several 2-3 mm calculi lying dependently in the distal third of the right ureter shortly before the right ureterovesicular junction, best  appreciated on coronal image 53 of series 5. There is mild right-sided hydroureter and moderate right-sided hydronephrosis. This appears related to a intermediate attenuation structure at the level of the right ureterovesicular junction (axial image 74 of series 2 and coronal image 50 of series 5) which measures approximately 1.3 x 1.3 x 1.2 cm. In addition, there is some intermediate attenuation thickening of the structures in the right renal hilum, poorly demonstrated on today's noncontrast CT examination. These findings are concerning for tumor. No left-sided hydroureteronephrosis. Bilateral adrenal glands are normal in appearance. Stomach/Bowel: Unenhanced appearance of the stomach is normal. There is no pathologic dilatation of small bowel or colon. Numerous colonic diverticulae are noted, without surrounding inflammatory changes to suggest an acute diverticulitis at this time. Status post appendectomy. Short segment of mid transverse colon extending into a small epigastric ventral hernia. Vascular/Lymphatic: Aortic atherosclerosis with postoperative changes of prior aorto bi-iliac bypass graft, poorly evaluated on today's noncontrast CT examination. 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 No definite lymphadenopathy identified in the abdomen or pelvis. Reproductive: Uterus and ovaries are atrophic. Small calcifications within the uterus, likely reflective of calcified fibroids. Other: Postoperative changes of mesh repair for ventral hernia. Trace volume of ascites. No pneumoperitoneum. Musculoskeletal: There are no aggressive appearing lytic or blastic lesions noted in the visualized portions of the skeleton. IMPRESSION: 1. Moderate right hydronephrosis and mild right hydroureter with  intermediate attenuation structure at the right ureterovesicular junction concerning for partially obstructing tumor (although thrombus could have a similar appearance). There is also probable tumor in the right renal collecting system poorly evaluated on today's noncontrast CT examination. 2. Multiple nonobstructive calculi in the right renal collecting system measuring 2-3 mm in size, as well as a cluster of small 2-3 mm calculi lying dependently in the dilated distal third of the right ureter. 3. Cirrhosis with trace volume of ascites. 4. Cardiomegaly with small right and trace left pleural effusions. 5. There are calcifications of the aortic valve. Echocardiographic correlation for evaluation of potential valvular dysfunction may be warranted if clinically indicated. 6. 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 7. Additional incidental findings, as above. Electronically Signed   By: Vinnie Langton M.D.   On: 01/22/2020 16:35   DG OR UROLOGY CYSTO IMAGE ([facility] ONLY)  Result Date: 01/24/2020 There is no interpretation for this exam.  This order is for images obtained during a surgical procedure.  Please See "Surgeries" Tab for more information regarding the procedure.     Medications:   . sodium chloride 50 mL/hr at 01/24/20 1108   . amLODipine  5 mg Oral Daily  . atenolol  25 mg Oral Daily  . cholecalciferol  1,000 Units Oral Daily  . ipratropium-albuterol  3 mL Nebulization Q6H  . loratadine  10 mg Oral Daily  . melatonin  5 mg Oral QHS  . methylPREDNISolone (SOLU-MEDROL) injection  60 mg Intravenous Q24H  . mometasone-formoterol  2 puff  Inhalation BID  . mupirocin ointment  1 application Nasal BID  . phenazopyridine  100 mg Oral TID WC  . rosuvastatin  5 mg Oral Daily  . tamsulosin  0.4 mg Oral Daily  . vitamin B-12  1,000 mcg Oral Daily   acetaminophen, albuterol, dextromethorphan-guaiFENesin, hydrALAZINE, melatonin, ondansetron  (ZOFRAN) IV, senna-docusate  Assessment/ Plan:  87 y.o. 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  She has complex urologic history and is actively followed by urology.  As above she has history of large papillary upper tract mass in prior biopsy was consistent with low-grade noninvasive urothelial cell carcinoma.  She previously declined management as this would likely have left her dialysis dependent.  1.  Acute kidney injury/chronic kidney disease stage IIIb/atrophic left kidney/right-sided hydronephrosis.  Patient underwent transurethral resection of bladder tumor, right ureteroscopy with laser lithotripsy of right ureteral stones and subsequent right ureteral stent placement.  Hopefully this will help to relieve the obstruction and improve renal function.  No immediate need for dialysis at the moment.  Family would not want dialysis in any case however.  Monitor renal parameters daily.  2.  Anemia of chronic kidney disease.  Hemoglobin 10.4.  No need for Epogen at this time.   LOS: 2 Trevor Wilkie 1/2/20222:11 PM

## 2020-01-24 NOTE — Anesthesia Procedure Notes (Signed)
Procedure Name: LMA Insertion Date/Time: 01/24/2020 11:21 AM Performed by: Lia Foyer, CRNA Pre-anesthesia Checklist: Patient identified, Emergency Drugs available, Suction available and Patient being monitored Patient Re-evaluated:Patient Re-evaluated prior to induction Oxygen Delivery Method: Circle system utilized Preoxygenation: Pre-oxygenation with 100% oxygen Induction Type: IV induction Ventilation: Mask ventilation without difficulty LMA: LMA flexible inserted LMA Size: 3.5 Tube type: Oral Number of attempts: 1 Airway Equipment and Method: Oral airway Placement Confirmation: positive ETCO2 and breath sounds checked- equal and bilateral Tube secured with: Tape Dental Injury: Teeth and Oropharynx as per pre-operative assessment

## 2020-01-24 NOTE — Anesthesia Preprocedure Evaluation (Signed)
Anesthesia Evaluation  Patient identified by MRN, date of birth, ID band Patient awake    Reviewed: Allergy & Precautions, H&P , NPO status , Patient's Chart, lab work & pertinent test results, reviewed documented beta blocker date and time   Airway Mallampati: II  TM Distance: >3 FB Neck ROM: full    Dental  (+) Teeth Intact   Pulmonary shortness of breath, COPD, former smoker,    Pulmonary exam normal        Cardiovascular Exercise Tolerance: Poor hypertension, On Medications + Peripheral Vascular Disease  Normal cardiovascular exam+ dysrhythmias + pacemaker  Rate:Normal     Neuro/Psych PSYCHIATRIC DISORDERS Depression negative neurological ROS     GI/Hepatic negative GI ROS, Neg liver ROS,   Endo/Other  negative endocrine ROS  Renal/GU Renal disease  negative genitourinary   Musculoskeletal   Abdominal   Peds  Hematology negative hematology ROS (+)   Anesthesia Other Findings   Reproductive/Obstetrics negative OB ROS                             Anesthesia Physical Anesthesia Plan  ASA: IV and emergent  Anesthesia Plan: General LMA   Post-op Pain Management:    Induction:   PONV Risk Score and Plan: 4 or greater  Airway Management Planned:   Additional Equipment:   Intra-op Plan:   Post-operative Plan:   Informed Consent: I have reviewed the patients History and Physical, chart, labs and discussed the procedure including the risks, benefits and alternatives for the proposed anesthesia with the patient or authorized representative who has indicated his/her understanding and acceptance.       Plan Discussed with: CRNA  Anesthesia Plan Comments:         Anesthesia Quick Evaluation

## 2020-01-24 NOTE — Progress Notes (Signed)
Mobility Specialist - Progress Note   01/24/20 1100  Mobility  Activity Off unit  Mobility performed by Mobility specialist     Pt currently off unit for procedure. Will attempt session at another date/time when appropriate.    Kathee Delton Mobility Specialist 01/24/20, 11:22 AM

## 2020-01-25 ENCOUNTER — Encounter: Payer: Self-pay | Admitting: Urology

## 2020-01-25 DIAGNOSIS — N179 Acute kidney failure, unspecified: Secondary | ICD-10-CM | POA: Diagnosis not present

## 2020-01-25 DIAGNOSIS — C689 Malignant neoplasm of urinary organ, unspecified: Secondary | ICD-10-CM | POA: Diagnosis not present

## 2020-01-25 DIAGNOSIS — I10 Essential (primary) hypertension: Secondary | ICD-10-CM | POA: Diagnosis not present

## 2020-01-25 DIAGNOSIS — J441 Chronic obstructive pulmonary disease with (acute) exacerbation: Secondary | ICD-10-CM | POA: Diagnosis not present

## 2020-01-25 DIAGNOSIS — I4891 Unspecified atrial fibrillation: Secondary | ICD-10-CM | POA: Diagnosis not present

## 2020-01-25 LAB — CREATININE, SERUM
Creatinine, Ser: 2.68 mg/dL — ABNORMAL HIGH (ref 0.44–1.00)
GFR, Estimated: 17 mL/min — ABNORMAL LOW (ref >60)

## 2020-01-25 NOTE — Progress Notes (Addendum)
[place] [facility] [place] NAME: Jessica [name]    MR#:  [id number]  DATE OF BIRTH:  1929-08-01  SUBJECTIVE:   Patient came in with increasing shortness of breath and generalized weakness. She was found to have elevated creatinine of 3.48. Baseline creatinine 1.5.  Patient feels a lot better today. Son in the room. Breathing has improved as well. Worked with physical therapy yesterday  Urine light pink REVIEW OF SYSTEMS:   Review of Systems  Constitutional: Negative for chills, fever and weight loss.  HENT: Negative for ear discharge, ear pain and nosebleeds.   Eyes: Negative for blurred vision, pain and discharge.  Respiratory: Negative for sputum production, wheezing and stridor.   Cardiovascular: Negative for chest pain, palpitations, orthopnea and PND.  Gastrointestinal: Negative for abdominal pain, diarrhea, nausea and vomiting.  Genitourinary: Negative for frequency and urgency.  Musculoskeletal: Negative for back pain and joint pain.  Neurological: Positive for weakness. Negative for sensory change, speech change and focal weakness.  Psychiatric/Behavioral: Negative for depression and hallucinations. The patient is nervous/anxious.    Tolerating Diet:yes Tolerating PT: HHPT  DRUG ALLERGIES:   Allergies  Allergen Reactions  . Codeine Other (See Comments)    Made her feel crazy  . Morphine Other (See Comments)    Made her feel crazy  . Zyrtec [Cetirizine] Other (See Comments)    Causes facial numbness    VITALS:  Blood pressure (!) 151/80, pulse 69, temperature 97.9 F (36.6 C), temperature source Oral, resp. rate 20, height 5\' 7"  (1.702 m), weight 61.2 kg, SpO2 93 %.  PHYSICAL EXAMINATION:   Physical Exam  GENERAL:  87 y.o.-year-old patient lying in the bed with no acute distress.   LUNGS:decreased breath sounds bilaterally, no wheezing, rales, rhonchi. No use of accessory muscles of respiration.  CARDIOVASCULAR: S1, S2  normal. No murmurs, rubs, or gallops.  ABDOMEN: Soft, nontender, nondistended. Bowel sounds present. No organomegaly or mass.  EXTREMITIES: No cyanosis, clubbing or edema b/l.    NEUROLOGIC: Cranial nerves II through XII are intact. No focal Motor or sensory deficits b/l.   PSYCHIATRIC:  patient is alert and oriented x 3.  SKIN: No obvious rash, lesion, or ulcer.   LABORATORY PANEL:  CBC Recent Labs  Lab 01/22/20 0609  WBC 2.9*  HGB 10.4*  HCT 30.7*  PLT 133*    Chemistries  Recent Labs  Lab 01/21/20 1404 01/22/20 0609 01/24/20 0422 01/25/20 0420  NA  --    < > 143  --   K  --    < > 3.7  --   CL  --    < > 114*  --   CO2  --    < > 17*  --   GLUCOSE  --    < > 91  --   BUN  --    < > 65*  --   CREATININE  --    < > 2.99* 2.68*  CALCIUM  --    < > 8.6*  --   MG 2.0  --   --   --    < > = values in this interval not displayed.   Cardiac Enzymes No results for input(s): TROPONINI in the last 168 hours. RADIOLOGY:  DG OR UROLOGY CYSTO IMAGE ([facility] ONLY)  Result Date: 01/24/2020 There is no interpretation for this exam.  This order is for images obtained during a surgical procedure.  Please See "Surgeries" Tab for more  information regarding the procedure.   ASSESSMENT AND PLAN:  Jessica [name] is a 87 y.o. female with medical history significant of hypertension, hyperlipidemia, COPD, gout, depression, rectal bleeding, pacemaker placement, atrial fibrillation on Eliquis, urothelial cancer, CKD-stage III, who presents with shortness of breath.  Acute renal failure superimposed on stage 3a chronic kidney disease ([place]):  H/o Recurrent Urothelial tumor right kidney Baseline Cre is 1.3, pt's Cre is 3.87 and BUN 59 on admission.  - Etiology is not clear. Likely due to dehydration and continuation of diuretics. H/o Uroeithelial mass right kidney with hydronephrosis and new [place] mass noted (seeding from renal mass) -- Dr. Holley Raring of renal is consulted --Urology consult with Dr  Diamantina Providence -- recommends to hold eliquis  --01/23/2020-- plans for cystoscopy, TURBT, right ureteroscopy, laser lithotripsy, and attempted stent placement on sunday - Avoid using renal toxic medications, hypotension and contrast dye (or carefully use) --Creat 3.93--3.48--2.99 -cont IVF --01/24/20--s/p Transurethral resection of bladder tumor, 1.5 cm Right ureteroscopy, basket extraction of ureteral stones, Right ureteroscopy, basket removal of distal ureteral tumor, Cystoscopy, right retrograde pyelogram with intraoperative interpretation, right ureteral stent placement --per urology hold Eliquis indefinitely due to risk of bleeding. Son is aware and he has discussed with Dr Diamantina Providence --01/25/20--breathing improving. Worked with PT. Does qualify for oxygen. Creat down to 2.6  COPD exacerbation [facility]): Patient has short of breath, mild wheezing on right side of the lung, indicating COPD exacerbation.  No oxygen desaturation. -Bronchodilators -Solu-Medrol 40 mg IV bid -Mucinex for cough  -Incentive spirometry - prn Nasal cannula oxygen as neededto maintain O2 saturation 93% or greater -patient now qualifies for home oxygen. -Change to PO prednisone taper tomorrow.  HYPERTENSION, BENIGN -IV hydralazine as needed -Continue amlodipine, atenolol  Atrial fibrillation (HCC) - Eliquis on hold indefinitely due to high risk for bleeding with renal tumor burden and stent in place and a more palliative approach to care--per URology -- continue atenolol  Hyperlipidemia -Crestor  Hypokalemia: K= 3.3 on admission. - Repleted K 4.2 - Mg level-2.0   DVT ppx: Eliquis (on hold for anticipated Urological procedure) Code Status: DNR per her son who is POA Family Communication: n Yes, patient's son in the room Disposition Plan:  Anticipate discharge back to previous ALF environment Consults called:  Dr. Holley Raring of renal, Dr Kathleene Hazel Admission status: Med-surg bed   Status is:  Inpatient  Remains inpatient appropriate because:Inpatient level of care appropriate due to severity of illness   Dispo: The patient is from: ALF              Anticipated d/c is to: ALF              Anticipated d/c date is: 01/26/2020              Patient currently is not medically stable to d/c.  PT eval noted. Will keep patient another day to make sure respiratory status is stable. Urology recommends one day of observation for possible change in color for urine.     TOTAL TIME TAKING CARE OF THIS PATIENT: 25 minutes.  >50% time spent on counselling and coordination of care  Note: This dictation was prepared with Dragon dictation along with smaller phrase technology. Any transcriptional errors that result from this process are unintentional.  Fritzi Mandes M.D    [facility]   CC: Primary care physician; [facility], Doctors MakingPatient ID: [name], female   DOB: 14-Oct-1929, 87 y.o.   MRN: [id number]

## 2020-01-25 NOTE — Care Management Important Message (Signed)
Important Message  Patient Details  Name: Jessica [name] MRN: [id number] Date of Birth: [birth year]   Medicare Important Message Given:  Yes     Dannette Barbara 01/25/2020, 11:14 AM

## 2020-01-25 NOTE — NC FL2 (Signed)
[place] LEVEL OF CARE SCREENING TOOL     IDENTIFICATION  Patient Name: Jessica [name] Birthdate: Jun 16, 1929 Sex: female Admission Date (Current Location): 01/21/2020  [place] and [place] Number:  Engineering geologist and Address:  [facility], [address] [place], [place] [address]      Provider Number: [id number]  Attending Physician Name and Address:  Fritzi Mandes, MD  Relative Name and Phone Number:       Current Level of Care: Hospital Recommended Level of Care: [place] Prior Approval Number:    Date Approved/Denied:   PASRR Number:    Discharge Plan: Other (Comment) (ALF)    Current Diagnoses: Patient Active Problem List   Diagnosis Date Noted  . Acute renal failure superimposed on stage 3a chronic kidney disease ([place]) 01/21/2020  . Hypokalemia 01/21/2020  . Atypical chest pain 08/25/2019  . Urothelial carcinoma ([place]) 01/18/2019  . Nephrolithiasis 01/18/2019  . Urinary tract infection symptoms 12/31/2018  . Bleeding hemorrhoids 02/27/2018  . Weight loss 08/30/2017  . Low back pain 08/30/2017  . Depression, major, single episode, mild ([place]) 05/20/2017  . Fall 05/20/2017  . Hypoglycemia 04/04/2017  . Gout 06/05/2016  . AAA (abdominal aortic aneurysm) without rupture ([place]) 04/18/2016  . Loss of sense of smell 12/30/2015  . Memory difficulties 12/30/2015  . Injury of right lower extremity and cellulitis 11/29/2014  . Renal artery aneurysm ([place]) 10/26/2014  . Renal artery stenosis ([place]) 09/15/2014  . Renal arterial aneurysm ([place]) 09/15/2014  . CN (constipation) 09/06/2014  . Change in stool caliber 09/06/2014  . Atherosclerosis of aorto-iliac bypass graft ([place]) 08/17/2014  . Shortness of breath 05/12/2014  . Conjunctival hemorrhage of right eye 08/13/2013  . Medicare annual wellness visit, subsequent 04/22/2013  . Allergic rhinitis 11/26/2012  . COPD exacerbation ([place]) 09/27/2011  .  Hyperlipidemia 02/22/2011  . Long term current use of anticoagulant 04/12/2010  . HYPERTENSION, BENIGN 08/25/2009  . Atrial fibrillation ([place]) 03/07/2009  . Carotid stenosis 08/24/2008  . AV BLOCK, COMPLETE 06/17/2008  . Aneurysm of abdominal vessel ([place]) 06/17/2008  . PACEMAKER-St.Jude 06/17/2008    Orientation RESPIRATION BLADDER Height & Weight     Self,Time,Situation,Place  O2 ([place] 2L) Continent Weight: 135 lb (61.2 kg) Height:  5\' 7"  (170.2 cm)  BEHAVIORAL SYMPTOMS/MOOD NEUROLOGICAL BOWEL NUTRITION STATUS      Continent Diet (renal with fluid restriction  1200 mL Fluid)  AMBULATORY STATUS COMMUNICATION OF NEEDS Skin   Limited Assist Verbally Normal                       Personal Care Assistance Level of Assistance  Bathing,Feeding,Dressing Bathing Assistance: Limited assistance Feeding assistance: Independent Dressing Assistance: Limited assistance     Functional Limitations Info  Sight,Hearing,Speech Sight Info: Adequate Hearing Info: Adequate Speech Info: Adequate    SPECIAL CARE FACTORS FREQUENCY  PT (By licensed PT),OT (By licensed OT)     PT Frequency: 2x OT Frequency: 2x            Contractures Contractures Info: Not present    Additional Factors Info  Code Status,Allergies Code Status Info: DNR Allergies Info: Codeine, Morphine, Zyrtec (Cetirizine)           Current Medications (01/25/2020):  This is the current hospital active medication list Current Facility-Administered Medications  Medication Dose Route Frequency Provider Last Rate Last Admin  . acetaminophen (TYLENOL) tablet 650 mg  650 mg Oral Q6H PRN Ivor Costa, MD      .  albuterol (PROVENTIL) (2.5 MG/3ML) 0.083% nebulizer solution 2.5 mg  2.5 mg Nebulization Q4H PRN Ivor Costa, MD   2.5 mg at 01/24/20 1422  . amLODipine (NORVASC) tablet 5 mg  5 mg Oral Daily Ivor Costa, MD   5 mg at 01/25/20 0829  . atenolol (TENORMIN) tablet 25 mg  25 mg Oral Daily Ivor Costa, MD   25 mg at 01/25/20  2836  . cholecalciferol (VITAMIN D3) tablet 1,000 Units  1,000 Units Oral Daily Ivor Costa, MD   1,000 Units at 01/25/20 [phone]  . dextromethorphan-guaiFENesin (MUCINEX DM) 30-600 MG per 12 hr tablet 1 tablet  1 tablet Oral BID PRN Ivor Costa, MD      . hydrALAZINE (APRESOLINE) injection 5 mg  5 mg Intravenous Q2H PRN Ivor Costa, MD      . ipratropium-albuterol (DUONEB) 0.5-2.5 (3) MG/3ML nebulizer solution 3 mL  3 mL Nebulization Q6H Fritzi Mandes, MD   3 mL at 01/25/20 1331  . loratadine (CLARITIN) tablet 10 mg  10 mg Oral Daily Ivor Costa, MD   10 mg at 01/25/20 0829  . melatonin tablet 3 mg  3 mg Oral QHS PRN Ivor Costa, MD      . melatonin tablet 5 mg  5 mg Oral QHS Sharion Settler, NP   5 mg at 01/23/20 2042  . methylPREDNISolone sodium succinate (SOLU-MEDROL) 125 mg/2 mL injection 60 mg  60 mg Intravenous Q24H Fritzi Mandes, MD      . mometasone-formoterol [facility]) 200-5 MCG/ACT inhaler 2 puff  2 puff Inhalation BID Ivor Costa, MD   2 puff at 01/25/20 0831  . ondansetron (ZOFRAN) injection 4 mg  4 mg Intravenous Q8H PRN Ivor Costa, MD      . rosuvastatin (CRESTOR) tablet 5 mg  5 mg Oral Daily Ivor Costa, MD   5 mg at 01/23/20 2046  . senna-docusate (Senokot-S) tablet 1 tablet  1 tablet Oral QHS PRN Ivor Costa, MD      . tamsulosin (FLOMAX) capsule 0.4 mg  0.4 mg Oral Daily Ivor Costa, MD   0.4 mg at 01/25/20 0829  . vitamin B-12 (CYANOCOBALAMIN) tablet 1,000 mcg  1,000 mcg Oral Daily Ivor Costa, MD   1,000 mcg at 01/23/20 2043     Discharge Medications: Please see discharge summary for a list of discharge medications.  Relevant Imaging Results:  Relevant Lab Results:   Additional Information SSN:[id number]  Gerrianne Scale Yazmen Briones, LCSW

## 2020-01-25 NOTE — Progress Notes (Signed)
   Subjective Status post right ureteral stent placement yesterday Denies any pain or urinary symptoms today, urine amber via pure wick  Physical Exam: BP (!) 151/80 (BP Location: Left Arm)   Pulse 69   Temp 97.9 F (36.6 C) (Oral)   Resp 20   Ht 5\' 7"  (1.702 m)   Wt 61.2 kg   SpO2 93%   BMI 21.14 kg/m    Constitutional:  Alert and oriented, No acute distress. Respiratory: Normal respiratory effort, no increased work of breathing. GI: Abdomen is soft, non-tender, non-distended GU: No CVA tenderness Urine amber and purulent  Laboratory Data: Creatinine 2.68(2.99)(3.48)  Assessment & Plan:   87 y.o.  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  She presented with right hydronephrosis, right distal ureteral stones, new right distal ureteral tumor, bladder tumor, and renal failure.  After a long conversation with the patient and her son, she opted for intervention with stent placement.  Now POD#1 TURBT, debulking of right distal ureteral tumor ~75%, right distal ureteral stone removal, and right ureteral stent placement.  Doing well.  Renal function improving.  Ideally would hold Eliquis indefinitely, as high risk for bleeding with renal tumor burden and stent in place and a more palliative approach to care.  Follow-up in clinic in ~4 weeks for symptom check and BMP Will plan for chronic stent changes every 9-12 months  Billey Co, MD

## 2020-01-25 NOTE — TOC Initial Note (Signed)
Transition of Care [facility]) - Initial/Assessment Note    Patient Details  Name: Jessica [name] MRN: [id number] Date of Birth: Sep 23, 1929  [facility]) CM/SW Contact:    Eileen Stanford, LCSW Phone Number: 01/25/2020, 2:20 PM  Clinical Narrative: Pt confirmed she is from [facility]. Pt states prior to admissions she was not getting any therapy there. Pt is agreeable to therapy there. [facility]. Referral given to [facility]. Pt will also need new 02-- referral given to [facility].  Pt states the ALF provides her with transport to all her outside appointments while also providing a MD in house. The ALF also fills and providers her with her meds.        Expected Discharge Plan: Assisted Living Barriers to Discharge: Continued Medical Work up   Patient Goals and CMS Choice Patient states their goals for this hospitalization and ongoing recovery are:: to get better   Choice offered to / list presented to : Patient  Expected Discharge Plan and Services Expected Discharge Plan: Assisted Living In-house Referral: NA   Post Acute Care Choice: [place] arrangements for the past 2 months: [place]                 DME Arranged: Oxygen DME Agency: [facility] Date DME Agency Contacted: 01/25/20 Time DME Agency Contacted: 47 Representative spoke with at DME Agency: zach [place]: PT,OT [place] Agency: [facility] Date [place]: 01/25/20 Time HH Agency Contacted: 1418    Prior Living Arrangements/Services Living arrangements for the past 2 months: [place] Lives with:: Self Patient language and need for interpreter reviewed:: Yes Do you feel safe going back to the place where you live?: Yes      Need for Family Participation in Patient Care: Yes (Comment) Care giver support system in place?: Yes (comment)   Criminal Activity/Legal Involvement Pertinent to Current Situation/Hospitalization: No - Comment  as needed  Activities of Daily Living Home Assistive Devices/Equipment: Walker (specify type) ADL Screening (condition at time of admission) Patient's cognitive ability adequate to safely complete daily activities?: Yes Is the patient deaf or have difficulty hearing?: No Does the patient have difficulty seeing, even when wearing glasses/contacts?: No Does the patient have difficulty concentrating, remembering, or making decisions?: No Patient able to express need for assistance with ADLs?: Yes Does the patient have difficulty dressing or bathing?: No Independently performs ADLs?: Yes (appropriate for developmental age) Does the patient have difficulty walking or climbing stairs?: Yes Weakness of Legs: None Weakness of Arms/Hands: None  Permission Sought/Granted Permission sought to share information with : Family Supports    Share Information with NAME: stephen  Permission granted to share info w AGENCY: [facility]  Permission granted to share info w Relationship: son     Emotional Assessment Appearance:: Appears stated age Attitude/Demeanor/Rapport: Engaged Affect (typically observed): Accepting,Appropriate Orientation: : Oriented to Self,Oriented to Place,Oriented to  Time,Oriented to Situation Alcohol / Substance Use: Not Applicable Psych Involvement: No (comment)  Admission diagnosis:  AKI (acute kidney injury) ([place]) [N17.9] Acute kidney injury ([place]) [N17.9] Acute renal failure superimposed on stage 3a chronic kidney disease ([place]) [N17.9, N18.31] Patient Active Problem List   Diagnosis Date Noted  . Acute renal failure superimposed on stage 3a chronic kidney disease ([place]) 01/21/2020  . Hypokalemia 01/21/2020  . Atypical chest pain 08/25/2019  . Urothelial carcinoma ([place]) 01/18/2019  . Nephrolithiasis 01/18/2019  . Urinary tract infection symptoms 12/31/2018  . Bleeding hemorrhoids 02/27/2018  .  Weight loss 08/30/2017  . Low back pain 08/30/2017  . Depression, major,  single episode, mild ([place]) 05/20/2017  . Fall 05/20/2017  . Hypoglycemia 04/04/2017  . Gout 06/05/2016  . AAA (abdominal aortic aneurysm) without rupture ([place]) 04/18/2016  . Loss of sense of smell 12/30/2015  . Memory difficulties 12/30/2015  . Injury of right lower extremity and cellulitis 11/29/2014  . Renal artery aneurysm ([place]) 10/26/2014  . Renal artery stenosis ([place]) 09/15/2014  . Renal arterial aneurysm ([place]) 09/15/2014  . CN (constipation) 09/06/2014  . Change in stool caliber 09/06/2014  . Atherosclerosis of aorto-iliac bypass graft ([place]) 08/17/2014  . Shortness of breath 05/12/2014  . Conjunctival hemorrhage of right eye 08/13/2013  . Medicare annual wellness visit, subsequent 04/22/2013  . Allergic rhinitis 11/26/2012  . COPD exacerbation ([place]) 09/27/2011  . Hyperlipidemia 02/22/2011  . Long term current use of anticoagulant 04/12/2010  . HYPERTENSION, BENIGN 08/25/2009  . Atrial fibrillation ([place]) 03/07/2009  . Carotid stenosis 08/24/2008  . AV BLOCK, COMPLETE 06/17/2008  . Aneurysm of abdominal vessel ([place]) 06/17/2008  . PACEMAKER-St.Jude 06/17/2008   PCP:  Orvis Brill, [facility]:   [facility] - [place] [facility], [place] - [address] [place] [address] Phone: [phone] Fax: [phone]     Social Determinants of Health (SDOH) Interventions    Readmission Risk Interventions Readmission Risk Prevention Plan 01/25/2020  Transportation Screening Complete  PCP or Specialist Appt within 3-5 Days Complete  HRI or [place] Complete  Social Work Consult for [place] Planning/Counseling Complete  Palliative Care Screening Not Applicable  Medication Review Press photographer) Complete  Some recent data might be hidden

## 2020-01-25 NOTE — Progress Notes (Signed)
Mobility Specialist - Progress Note   01/25/20 1649  Mobility  Activity Sat and stood x 3  Range of Motion/Exercises Right leg;Left leg (marching, side stepping)  Level of Assistance Minimal assist, patient does 75% or more  Assistive Device Front wheel walker  Distance Ambulated (ft) 3 ft  Mobility Response Tolerated well  Mobility performed by Mobility specialist  $Mobility charge 1 Mobility    Pre-mobility: 70 HR, 93% SpO2 Post-mobility: 70 HR, 94% SpO2   Pt was lying in bed upon arrival utilizing 2L [place] O2. Pt agreed to session. Pt denied pain, nausea, or fatigue. Pt did state that she experiences pain during ambulation however. Pt was able to get EOB with minA. Upon sitting, pt did c/o of dizziness which resolved some with time. Pt performed STSx3 with minA, progressing to lateral ambulation on 3rd attempt. Pt appeared sad during session as she went on to say, "I don't know why God just won't take me out of here now." Mobility offered her encouragement and comfort. Further mobility deferred d/t dizziness becoming persistent. Dizziness did resolve when returned to supine. Overall, pt tolerated session well. Pt was left in bed with all needs in reach and alarm set. Nurse notified.    Kathee Delton Mobility Specialist 01/25/20, 4:54 PM

## 2020-01-25 NOTE — Evaluation (Signed)
Physical Therapy Evaluation Patient Details Name: Jessica [name] MRN: [id number] DOB: [birth year] Today's Date: 01/25/2020   History of Present Illness  Pt admitted for acute renal failure and is s/p transurethral resection of bladder tummor, R ureteroscopy, laser lithroscopy and stent placement. History includes COPD, HTN, HLD, gout, depression, and pacemaker.  Clinical Impression  Pt is a pleasant 87 year old female who was admitted for acute renal failure. Pt performs bed mobility/transfers with min assist and ambulation with cga and RW. Mobility limited by O2 sats, MD aware. Pt demonstrates deficits with strength/mobility/endurance. Would benefit from skilled PT to address above deficits and promote optimal return to PLOF. Recommend transition to [place] upon discharge from acute hospitalization. SaO2 on room air at rest = 93% SaO2 on room air while ambulating = 76% SaO2 on 2 liters of O2 while ambulating = 90%      Follow Up Recommendations Home health PT    Equipment Recommendations  None recommended by PT    Recommendations for Other Services       Precautions / Restrictions Precautions Precautions: Fall Restrictions Weight Bearing Restrictions: No      Mobility  Bed Mobility Overal bed mobility: Needs Assistance Bed Mobility: Supine to Sit     Supine to sit: Min assist     General bed mobility comments: needs slight assist for seated at EOB for trunk support. Once seated, able to sit with upright posture    Transfers Overall transfer level: Needs assistance Equipment used: Rolling walker (2 wheeled) Transfers: Sit to/from Stand Sit to Stand: Min assist         General transfer comment: assist for upright posture. SLight post sway, however able to maintain upright posture. RW used for safety. Vitals assessed  Ambulation/Gait Ambulation/Gait assistance: Min guard Gait Distance (Feet): 5 Feet Assistive device: Rolling walker (2 wheeled) Gait  Pattern/deviations: Step-to pattern     General Gait Details: ambulated to recliner with safe technique. Cues for sequencing. Slight SOB symptoms noted  Stairs            Wheelchair Mobility    Modified Rankin (Stroke Patients Only)       Balance Overall balance assessment: Needs assistance Sitting-balance support: Feet supported Sitting balance-Leahy Scale: Fair     Standing balance support: Bilateral upper extremity supported Standing balance-Leahy Scale: Fair Standing balance comment: slight post leaning                             Pertinent Vitals/Pain Pain Assessment: No/denies pain    Home Living Family/patient expects to be discharged to:: Assisted living               Home Equipment: Walker - 2 wheels      Prior Function Level of Independence: Needs assistance   Gait / Transfers Assistance Needed: uses RW for ambulation     Comments: Reports staff assist her for bathing/dressing.     Hand Dominance        Extremity/Trunk Assessment   Upper Extremity Assessment Upper Extremity Assessment: Generalized weakness (B UE grossly 3+/5)    Lower Extremity Assessment Lower Extremity Assessment: Generalized weakness (B LE grossly 4/5)       Communication   Communication: No difficulties  Cognition Arousal/Alertness: Awake/alert Behavior During Therapy: WFL for tasks assessed/performed Overall Cognitive Status: Within Functional Limits for tasks assessed  General Comments      Exercises Other Exercises Other Exercises: supine ther-ex performed on B LE including SLRs, AP, quad sets, and grip squeezes with washcloth. 10 reps with cga. Safe technique   Assessment/Plan    PT Assessment Patient needs continued PT services  PT Problem List Decreased strength;Decreased activity tolerance;Decreased balance;Decreased mobility;Cardiopulmonary status limiting activity       PT  Treatment Interventions Gait training;DME instruction;Therapeutic exercise;Balance training    PT Goals (Current goals can be found in the Care Plan section)  Acute Rehab PT Goals Patient Stated Goal: to go home PT Goal Formulation: With patient Time For Goal Achievement: 02/08/20 Potential to Achieve Goals: Good    Frequency Min 2X/week   Barriers to discharge        Co-evaluation               AM-PAC PT "6 Clicks" Mobility  Outcome Measure Help needed turning from your back to your side while in a flat bed without using bedrails?: None Help needed moving from lying on your back to sitting on the side of a flat bed without using bedrails?: A Little Help needed moving to and from a bed to a chair (including a wheelchair)?: A Little Help needed standing up from a chair using your arms (e.g., wheelchair or bedside chair)?: A Little Help needed to walk in hospital room?: A Little Help needed climbing 3-5 steps with a railing? : A Lot 6 Click Score: 18    End of Session Equipment Utilized During Treatment: Gait belt;Oxygen Activity Tolerance: Patient tolerated treatment well Patient left: in chair;with chair alarm set Nurse Communication: Mobility status PT Visit Diagnosis: Muscle weakness (generalized) (M62.81);Difficulty in walking, not elsewhere classified (R26.2);Unsteadiness on feet (R26.81)    Time: 5462-7035 PT Time Calculation (min) (ACUTE ONLY): 30 min   Charges:   PT Evaluation $PT Eval Low Complexity: 1 Low PT Treatments $Therapeutic Exercise: 8-22 mins        Greggory Stallion, PT, DPT [phone]   Jessica [name] 01/25/2020, 1:26 PM

## 2020-01-25 NOTE — Progress Notes (Signed)
SATURATION QUALIFICATIONS:   Patient Saturations on Room Air at Rest = 93%  Patient Saturations on Room Air while Ambulating = 76%  Patient Saturations on 2 Liters of oxygen while Ambulating = 90%

## 2020-01-25 NOTE — Progress Notes (Signed)
[facility]  ROUNDING NOTE   Subjective:   UOP 1100 Creatinine 2.68  NS at 22mL/hr   Objective:  Vital signs in last 24 hours:  Temp:  [97.6 F (36.4 C)-97.9 F (36.6 C)] 97.9 F (36.6 C) (01/03 0800) Pulse Rate:  [69-70] 69 (01/03 0800) Resp:  [18-20] 20 (01/03 0800) BP: (143-162)/(58-81) 151/80 (01/03 0800) SpO2:  [87 %-99 %] 93 % (01/03 0849)  Weight change:  Filed Weights   01/21/20 0918  Weight: 61.2 kg    Intake/Output: I/O last 3 completed shifts: In: 777.1 [I.V.:777.1] Out: 1500 [Urine:1500]   Intake/Output this shift:  Total I/O In: 240 [P.O.:240] Out: -   Physical Exam: General:  No acute distress, sitting in chair  Head:  Normocephalic, atraumatic. Moist oral mucosal membranes  Eyes:  Anicteric  Neck:  Supple  Lungs:   Clear to auscultation, normal effort  Heart:  regular  Abdomen:   Soft, nontender, bowel sounds present  Extremities:  no peripheral edema.  Neurologic:  Awake, alert, following commands  Skin:  No lesions  Access:  none    Basic Metabolic Panel: Recent Labs  Lab 01/21/20 0843 01/21/20 1404 01/22/20 0609 01/24/20 0422 01/25/20 0420  NA 141  --  142 143  --   K 3.3*  --  4.2 3.7  --   CL 108  --  115* 114*  --   CO2 21*  --  19* 17*  --   GLUCOSE 114*  --  135* 91  --   BUN 59*  --  61* 65*  --   CREATININE 3.87*  --  3.48* 2.99* 2.68*  CALCIUM 8.8*  --  8.2* 8.6*  --   MG  --  2.0  --   --   --     Liver Function Tests: No results for input(s): AST, ALT, ALKPHOS, BILITOT, PROT, ALBUMIN in the last 168 hours. No results for input(s): LIPASE, AMYLASE in the last 168 hours. No results for input(s): AMMONIA in the last 168 hours.  CBC: Recent Labs  Lab 01/21/20 0843 01/22/20 0609  WBC 5.8 2.9*  HGB 11.1* 10.4*  HCT 33.2* 30.7*  MCV 91.7 91.1  PLT 130* 133*    Cardiac Enzymes: No results for input(s): CKTOTAL, CKMB, CKMBINDEX, TROPONINI in the last 168 hours.  BNP: Invalid input(s):  POCBNP  CBG: No results for input(s): GLUCAP in the last 168 hours.  Microbiology: Results for orders placed or performed during the hospital encounter of 01/21/20  Resp Panel by RT-PCR (Flu A&B, Covid) Nasopharyngeal Swab     Status: None   Collection Time: 01/21/20  2:05 PM   Specimen: Nasopharyngeal Swab; Nasopharyngeal(NP) swabs in vial transport medium  Result Value Ref Range Status   SARS Coronavirus 2 by RT PCR NEGATIVE NEGATIVE Final    Comment: (NOTE) SARS-CoV-2 target nucleic acids are NOT DETECTED.  The SARS-CoV-2 RNA is generally detectable in upper respiratory specimens during the acute phase of infection. The lowest concentration of SARS-CoV-2 viral copies this assay can detect is 138 copies/mL. A negative result does not preclude SARS-Cov-2 infection and should not be used as the sole basis for treatment or other patient management decisions. A negative result may occur with  improper specimen collection/handling, submission of specimen other than nasopharyngeal swab, presence of viral mutation(s) within the areas targeted by this assay, and inadequate number of viral copies(<138 copies/mL). A negative result must be combined with clinical observations, patient history, and epidemiological information. The expected  result is Negative.  Fact Sheet for Patients:  EntrepreneurPulse.com.au  Fact Sheet for Healthcare Providers:  IncredibleEmployment.be  This test is no t yet approved or cleared by the [place] FDA and  has been authorized for detection and/or diagnosis of SARS-CoV-2 by FDA under an Emergency Use Authorization (EUA). This EUA will remain  in effect (meaning this test can be used) for the duration of the COVID-19 declaration under Section 564(b)(1) of the Act, 21 U.S.C.section 360bbb-3(b)(1), unless the authorization is terminated  or revoked sooner.       Influenza A by PCR NEGATIVE NEGATIVE Final    Influenza B by PCR NEGATIVE NEGATIVE Final    Comment: (NOTE) The Xpert Xpress SARS-CoV-2/FLU/RSV plus assay is intended as an aid in the diagnosis of influenza from Nasopharyngeal swab specimens and should not be used as a sole basis for treatment. Nasal washings and aspirates are unacceptable for Xpert Xpress SARS-CoV-2/FLU/RSV testing.  Fact Sheet for Patients: EntrepreneurPulse.com.au  Fact Sheet for Healthcare Providers: IncredibleEmployment.be  This test is not yet approved or cleared by the [place] FDA and has been authorized for detection and/or diagnosis of SARS-CoV-2 by FDA under an Emergency Use Authorization (EUA). This EUA will remain in effect (meaning this test can be used) for the duration of the COVID-19 declaration under Section 564(b)(1) of the Act, 21 U.S.C. section 360bbb-3(b)(1), unless the authorization is terminated or revoked.  Performed at [facility], [address], [place], [place] [address]   Surgical PCR screen     Status: None   Collection Time: 01/24/20  7:47 AM   Specimen: Nasal Mucosa; Nasal Swab  Result Value Ref Range Status   MRSA, PCR NEGATIVE NEGATIVE Final   Staphylococcus aureus NEGATIVE NEGATIVE Final    Comment: (NOTE) The Xpert SA Assay (FDA approved for NASAL specimens in patients 76 years of age and older), is one component of a comprehensive surveillance program. It is not intended to diagnose infection nor to guide or monitor treatment. Performed at [facility], [address] [place],  [address]     Coagulation Studies: No results for input(s): LABPROT, INR in the last 72 hours.  Urinalysis: No results for input(s): COLORURINE, LABSPEC, PHURINE, GLUCOSEU, HGBUR, BILIRUBINUR, KETONESUR, PROTEINUR, UROBILINOGEN, NITRITE, LEUKOCYTESUR in the last 72 hours.  Invalid input(s): APPERANCEUR    Imaging: DG OR UROLOGY CYSTO IMAGE ([place])  Result  Date: 01/24/2020 There is no interpretation for this exam.  This order is for images obtained during a surgical procedure.  Please See "Surgeries" Tab for more information regarding the procedure.     Medications:    . amLODipine  5 mg Oral Daily  . atenolol  25 mg Oral Daily  . cholecalciferol  1,000 Units Oral Daily  . ipratropium-albuterol  3 mL Nebulization Q6H  . loratadine  10 mg Oral Daily  . melatonin  5 mg Oral QHS  . methylPREDNISolone (SOLU-MEDROL) injection  60 mg Intravenous Q24H  . mometasone-formoterol  2 puff Inhalation BID  . rosuvastatin  5 mg Oral Daily  . tamsulosin  0.4 mg Oral Daily  . vitamin B-12  1,000 mcg Oral Daily   acetaminophen, albuterol, dextromethorphan-guaiFENesin, hydrALAZINE, melatonin, ondansetron (ZOFRAN) IV, senna-docusate  Assessment/ Plan:   [name] is a 87 y.o. white female with atrophic left kidney, urothelial cell carcinoma, nephrolithiasis, abdominal aortic aneurysm, complete heart block status post pacemaker placement, COPD, hypertension, hyperlipidemia who was admitted to [facility] on 01/21/2020.  Right ureteral stent placed  on 1/2 by Dr. Diamantina Providence.   1.  Acute kidney injury on chronic kidney disease stage IIIb: baseline creatinine of 1.55, GFR of 32 on 12/28/19.  Acute kidney injury secondary to obstructive uropathy.  Patient is not interested in dialysis. At this time, no acute indication for dialysis.   2.  Anemia of chronic kidney disease.  Hemoglobin 10.4.  Trending down, continue to monitor.   3. Hypertension: current regimen of tamsulosin, amlodipine and atenolol.    LOS: 3 Jerami Tammen 1/3/20221:38 PM

## 2020-01-26 DIAGNOSIS — J441 Chronic obstructive pulmonary disease with (acute) exacerbation: Secondary | ICD-10-CM | POA: Diagnosis not present

## 2020-01-26 DIAGNOSIS — I4891 Unspecified atrial fibrillation: Secondary | ICD-10-CM | POA: Diagnosis not present

## 2020-01-26 DIAGNOSIS — I10 Essential (primary) hypertension: Secondary | ICD-10-CM | POA: Diagnosis not present

## 2020-01-26 DIAGNOSIS — N179 Acute kidney failure, unspecified: Secondary | ICD-10-CM | POA: Diagnosis not present

## 2020-01-26 DIAGNOSIS — R0902 Hypoxemia: Secondary | ICD-10-CM

## 2020-01-26 LAB — SURGICAL PATHOLOGY

## 2020-01-26 MED ORDER — PREDNISONE 20 MG PO TABS: 50.0000 mg | ORAL_TABLET | Freq: Every day | ORAL | Status: DC

## 2020-01-26 MED ORDER — PREDNISONE 10 MG PO TABS: 50.0000 mg | ORAL_TABLET | Freq: Every day | ORAL | 0 refills | Status: DC

## 2020-01-26 MED ORDER — DM-GUAIFENESIN ER 30-600 MG PO TB12: 1.0000 | ORAL_TABLET | Freq: Two times a day (BID) | ORAL | 0 refills | Status: AC | PRN

## 2020-01-26 NOTE — TOC Transition Note (Signed)
Transition of Care [facility]) - CM/SW Discharge Note   Patient Details  Name: Jessica [name] MRN: [id number] Date of Birth: Feb 01, 1929  Transition of Care [facility]) CM/SW Contact:  Beverly Sessions, RN Phone Number: 01/26/2020, 10:25 AM   Clinical Narrative:     Patient to discharge back to [facility] today Confirmed with Thayer Headings at [facility] that she has received the [facility].  She is aware that patient will be discharging with new O2  Portable O2 tank at bedside.  Per Zack with [employer] home set up will be delivered today to [place] [place] extension  Hard copy of Fl2 sent with son.  Son to transport back to [place] with [facility] notified of discharge   Final next level of care: Assisted Living (with home health) Barriers to Discharge: No Barriers Identified   Patient Goals and CMS Choice Patient states their goals for this hospitalization and ongoing recovery are:: to get better   Choice offered to / list presented to : Patient  Discharge Placement                Patient to be transferred to facility by: Son Name of family member notified: son Patient and family notified of of transfer: 01/26/20  Discharge Plan and Services In-house Referral: NA   Post Acute Care Choice: Home Health          DME Arranged: Oxygen DME Agency: [employer] Date DME Agency Contacted: 01/25/20 Time DME Agency Contacted: [phone] Representative spoke with at DME Agency: zach HH Arranged: PT,OT [place] Date [place]: 01/25/20 Time [place]: 1418    Social Determinants of Health (SDOH) Interventions     Readmission Risk Interventions Readmission Risk Prevention Plan 01/25/2020  Transportation Screening Complete  PCP or Specialist Appt within 3-5 Days Complete  HRI or [place] Complete  Social Work Consult for [place] Planning/Counseling Complete  Palliative Care Screening Not Applicable  Medication Review Human resources officer) Complete  Some recent data might be hidden

## 2020-01-26 NOTE — Discharge Summary (Signed)
Jessica [name] at [place] NAME: Jessica [name]    MR#:  [id number]  DATE OF BIRTH:  Mar 30, 1929  DATE OF ADMISSION:  01/21/2020 ADMITTING PHYSICIAN: Ivor Costa, MD  DATE OF DISCHARGE: 01/26/2020  PRIMARY CARE PHYSICIAN: [facility]    ADMISSION DIAGNOSIS:  AKI (acute kidney injury) ([place]) [N17.9] Acute kidney injury ([place]) [N17.9] Acute renal failure superimposed on stage 3a chronic kidney disease ([place]) [N17.9, N18.31]  DISCHARGE DIAGNOSIS:  Acute renal failure superimposed on stage IIIA chronic kidney disease Recurrent urothelial tumor right kidney status post cystoscopy right ureteroscopy, laser lithotripsy and stent placement COPD exacerbation with hypoxia-- now on oxygen SECONDARY DIAGNOSIS:   Past Medical History:  Diagnosis Date  . AAA (abdominal aortic aneurysm) ([place])   . Atrioventricular block, complete ([place])   . Cardiac pacemaker in situ   . COPD (chronic obstructive pulmonary disease) ([place])   . Gout   . Hernia   . Hyperlipidemia   . Hypertension   . Kidney stone   . Presence of permanent cardiac pacemaker   . Rectal bleeding     HOSPITAL COURSE:   Jessica [name] a 87 y.o.femalewith medical history significant ofhypertension, hyperlipidemia, COPD, gout, depression, rectal bleeding, pacemaker placement, atrial fibrillation on Eliquis, urothelial cancer, CKD-stage III, who presents with shortness of breath.  Acute renal failure superimposed on stage 3a chronic kidney disease ([place]): H/o Recurrent Urothelial tumor right kidney Baseline Cre is1.3, pt's Cre is3.87 and BUN 59on admission. -Etiology is not clear. Likely due to dehydration and continuation of diuretics. H/o Uroeithelial mass right kidney with hydronephrosis and new [place] mass noted (seeding from renal mass) -- Dr. Holley Raring of renal is consulted --Urology consult with Dr Diamantina Providence -- recommends to hold eliquis  --01/23/2020-- plans for cystoscopy,  TURBT, right ureteroscopy, laser lithotripsy, and attempted stent placement on sunday - Avoid using renal toxic medications, hypotension and contrast dye (or carefully use) --Creat 3.93--3.48--2.99 -cont IVF --01/24/20--s/p Transurethral resection of bladder tumor,1.5 cm Right ureteroscopy, basket extraction of ureteral stones, Right ureteroscopy,basketremoval of distal ureteral tumor, Cystoscopy, right retrograde pyelogram with intraoperative interpretation, right ureteral stent placement --per urology hold Eliquis indefinitely due to risk of bleeding. Son is aware and he has discussed with Dr Diamantina Providence --01/25/20--breathing improving. Worked with PT. Does qualify for oxygen. Creat down to 2.6 --01/26/20--feeling well  COPD exacerbation (HCC):Patienthasshort of breath, mild wheezing on right side of the lung, indicating COPD exacerbation.-Bronchodilators -Solu-Medrol40 mg IV bid--change to po taper -Mucinex for cough  -Incentive spirometry -prnNasal cannula oxygen as neededto maintain O2 saturation 93% or greater -patient now qualifies for home oxygen  HYPERTENSION, BENIGN -IV hydralazine as needed -Continue amlodipine, atenolol  Atrial fibrillation (HCC) - Eliquis on hold indefinitely due to high risk for bleeding with renal tumor burden and stent in place and a more palliative approach to care--per URology -- continue atenolol  Hyperlipidemia -Crestor  Hypokalemia: K=3.3on admission. - Repleted K 4.2 - Mg level-2.0   DVT IHK:[id number] (on hold for anticipated Urological procedure) Code Status:DNR per her son who is POA Family Communication: n Yes, patient'sson in the room Disposition Plan: Anticipate discharge back to previous ALFenvironment Consults called:Dr. Holley Raring of renal, Dr Kathleene Hazel Admission status: Med-surg bed   Status is: Inpatient    Dispo: The patient is from: ALF  Anticipated d/c is to: ALF   Anticipated d/c date is: 01/26/2020  Patient currently is medically improving and best at baseline to d/c.  PT eval noted.  CONSULTS OBTAINED:  Treatment  Team:  Anthonette Legato, MD Billey Co, MD  DRUG ALLERGIES:   Allergies  Allergen Reactions  . Codeine Other (See Comments)    Made her feel crazy  . Morphine Other (See Comments)    Made her feel crazy  . Zyrtec [Cetirizine] Other (See Comments)    Causes facial numbness    DISCHARGE MEDICATIONS:   Allergies as of 01/26/2020      Reactions   Codeine Other (See Comments)   Made her feel crazy   Morphine Other (See Comments)   Made her feel crazy   Zyrtec [cetirizine] Other (See Comments)   Causes facial numbness      Medication List    STOP taking these medications   apixaban 2.5 MG Tabs tablet Commonly known as: Eliquis     TAKE these medications   acetaminophen 500 MG tablet Commonly known as: TYLENOL Take 500 mg by mouth every 6 (six) hours as needed for moderate pain or headache.   albuterol 108 (90 Base) MCG/ACT inhaler Commonly known as: ProAir HFA Inhale 2 puffs into the lungs every 6 (six) hours as needed for wheezing or shortness of breath.   Allergy Relief 10 MG tablet Generic drug: loratadine Take 10 mg by mouth daily.   amLODipine 5 MG tablet Commonly known as: NORVASC Take 1 tablet (5 mg total) by mouth daily. Please call to schedule office visit for further refills.   atenolol 25 MG tablet Commonly known as: TENORMIN Take 1 tablet (25 mg total) by mouth daily. Please call to schedule office visit for further refills.   cholecalciferol 25 MCG (1000 UNIT) tablet Commonly known as: VITAMIN D3 Take 1,000 Units by mouth daily.   dextromethorphan-guaiFENesin 30-600 MG 12hr tablet Commonly known as: MUCINEX DM Take 1 tablet by mouth 2 (two) times daily as needed for cough.   Fluticasone-Salmeterol 250-50 MCG/DOSE Aepb Commonly known as: Advair Diskus Inhale 1 puff into the  lungs in the morning and at bedtime.   furosemide 20 MG tablet Commonly known as: LASIX Take 1 tablet (20 mg total) by mouth as needed (for shortness of breath/edema).   melatonin 3 MG Tabs tablet Take 1 tablet (3 mg) by mouth once daily at bedtime as needed for sleep   predniSONE 10 MG tablet Commonly known as: DELTASONE Take 5 tablets (50 mg total) by mouth daily with breakfast. Start taking on: January 27, 2020   rosuvastatin 5 MG tablet Commonly known as: CRESTOR Take 1 tablet (5 mg total) by mouth daily.   senna-docusate 8.6-50 MG tablet Commonly known as: Senokot-S Take 2 tablets by mouth once daily at bedtime as needed for constipation   tamsulosin 0.4 MG Caps capsule Commonly known as: FLOMAX Take 1 capsule (0.4 mg total) by mouth daily.   vitamin B-12 1000 MCG tablet Commonly known as: CYANOCOBALAMIN Take 1 tablet (1000 mg) by mouth once daily at bedtime            Durable Medical Equipment  (From admission, onward)         Start     Ordered   01/25/20 1528  For home use only DME oxygen  Once       Question Answer Comment  Length of Need 6 Months   Mode or (Route) Nasal cannula   Liters per Minute 2   Frequency Continuous (stationary and portable oxygen unit needed)   Oxygen conserving device Yes   Oxygen delivery system Gas      01/25/20 1528  If you experience worsening of your admission symptoms, develop shortness of breath, life threatening emergency, suicidal or homicidal thoughts you must seek medical attention immediately by calling 911 or calling your MD immediately  if symptoms less severe.  You Must read complete instructions/literature along with all the possible adverse reactions/side effects for all the Medicines you take and that have been prescribed to you. Take any new Medicines after you have completely understood and accept all the possible adverse reactions/side effects.   Please note  You were cared for by a hospitalist  during your hospital stay. If you have any questions about your discharge medications or the care you received while you were in the hospital after you are discharged, you can call the unit and asked to speak with the hospitalist on call if the hospitalist that took care of you is not available. Once you are discharged, your primary care physician will handle any further medical issues. Please note that NO REFILLS for any discharge medications will be authorized once you are discharged, as it is imperative that you return to your primary care physician (or establish a relationship with a primary care physician if you do not have one) for your aftercare needs so that they can reassess your need for medications and monitor your lab values. Today   SUBJECTIVE   Sob improving. No wheezing  VITAL SIGNS:  Blood pressure (!) 142/73, pulse 70, temperature 97.8 F (36.6 C), temperature source Oral, resp. rate 18, height 5\' 7"  (1.702 m), weight 61.2 kg, SpO2 97 %.  I/O:    Intake/Output Summary (Last 24 hours) at 01/26/2020 0851 Last data filed at 01/26/2020 0606 Gross per 24 hour  Intake 600 ml  Output 1400 ml  Net -800 ml    PHYSICAL EXAMINATION:  GENERAL:  87 y.o.-year-old patient lying in the bed with no acute distress.  LUNGS: Normal breath sounds bilaterally, no wheezing, rales,rhonchi or crepitation. No use of accessory muscles of respiration.  CARDIOVASCULAR: S1, S2 normal. No murmurs, rubs, or gallops.  ABDOMEN: Soft, non-tender, non-distended. Bowel sounds present. No organomegaly or mass.  EXTREMITIES: No pedal edema, cyanosis, or clubbing.  NEUROLOGIC: Cranial nerves II through XII are intact. Muscle strength 5/5 in all extremities. Sensation intact. Gait not checked.  PSYCHIATRIC: The patient is alert and oriented x 3.  SKIN: No obvious rash, lesion, or ulcer.   DATA REVIEW:   CBC  Recent Labs  Lab 01/22/20 0609  WBC 2.9*  HGB 10.4*  HCT 30.7*  PLT 133*    Chemistries   Recent Labs  Lab 01/21/20 1404 01/22/20 0609 01/24/20 0422 01/25/20 0420  NA  --    < > 143  --   K  --    < > 3.7  --   CL  --    < > 114*  --   CO2  --    < > 17*  --   GLUCOSE  --    < > 91  --   BUN  --    < > 65*  --   CREATININE  --    < > 2.99* 2.68*  CALCIUM  --    < > 8.6*  --   MG 2.0  --   --   --    < > = values in this interval not displayed.    Microbiology Results   Recent Results (from the past 240 hour(s))  Resp Panel by RT-PCR (Flu A&B, Covid) Nasopharyngeal Swab  Status: None   Collection Time: 01/21/20  2:05 PM   Specimen: Nasopharyngeal Swab; Nasopharyngeal(NP) swabs in vial transport medium  Result Value Ref Range Status   SARS Coronavirus 2 by RT PCR NEGATIVE NEGATIVE Final    Comment: (NOTE) SARS-CoV-2 target nucleic acids are NOT DETECTED.  The SARS-CoV-2 RNA is generally detectable in upper respiratory specimens during the acute phase of infection. The lowest concentration of SARS-CoV-2 viral copies this assay can detect is 138 copies/mL. A negative result does not preclude SARS-Cov-2 infection and should not be used as the sole basis for treatment or other patient management decisions. A negative result may occur with  improper specimen collection/handling, submission of specimen other than nasopharyngeal swab, presence of viral mutation(s) within the areas targeted by this assay, and inadequate number of viral copies(<138 copies/mL). A negative result must be combined with clinical observations, patient history, and epidemiological information. The expected result is Negative.  Fact Sheet for Patients:  EntrepreneurPulse.com.au  Fact Sheet for Healthcare Providers:  IncredibleEmployment.be  This test is no t yet approved or cleared by the [place] FDA and  has been authorized for detection and/or diagnosis of SARS-CoV-2 by FDA under an Emergency Use Authorization (EUA). This EUA will remain  in  effect (meaning this test can be used) for the duration of the COVID-19 declaration under Section 564(b)(1) of the Act, 21 U.S.C.section 360bbb-3(b)(1), unless the authorization is terminated  or revoked sooner.       Influenza A by PCR NEGATIVE NEGATIVE Final   Influenza B by PCR NEGATIVE NEGATIVE Final    Comment: (NOTE) The Xpert Xpress SARS-CoV-2/FLU/RSV plus assay is intended as an aid in the diagnosis of influenza from Nasopharyngeal swab specimens and should not be used as a sole basis for treatment. Nasal washings and aspirates are unacceptable for Xpert Xpress SARS-CoV-2/FLU/RSV testing.  Fact Sheet for Patients: EntrepreneurPulse.com.au  Fact Sheet for Healthcare Providers: IncredibleEmployment.be  This test is not yet approved or cleared by the [place] FDA and has been authorized for detection and/or diagnosis of SARS-CoV-2 by FDA under an Emergency Use Authorization (EUA). This EUA will remain in effect (meaning this test can be used) for the duration of the COVID-19 declaration under Section 564(b)(1) of the Act, 21 U.S.C. section 360bbb-3(b)(1), unless the authorization is terminated or revoked.  Performed at [facility], [address], [place], [place] [address]   Surgical PCR screen     Status: None   Collection Time: 01/24/20  7:47 AM   Specimen: Nasal Mucosa; Nasal Swab  Result Value Ref Range Status   MRSA, PCR NEGATIVE NEGATIVE Final   Staphylococcus aureus NEGATIVE NEGATIVE Final    Comment: (NOTE) The Xpert SA Assay (FDA approved for NASAL specimens in patients 25 years of age and older), is one component of a comprehensive surveillance program. It is not intended to diagnose infection nor to guide or monitor treatment. Performed at [facility], [address] [place], [place] [address]     RADIOLOGY:  DG OR UROLOGY CYSTO IMAGE [facility])  Result Date: 01/24/2020 There is no  interpretation for this exam.  This order is for images obtained during a surgical procedure.  Please See "Surgeries" Tab for more information regarding the procedure.     CODE STATUS:     Code Status Orders  (From admission, onward)         Start     Ordered   01/21/20 1752  Do not attempt resuscitation (DNR)  Continuous        01/21/20 1752        Code Status History    Date Active Date Inactive Code Status Order ID Comments User Context   01/21/2020 1744 01/21/2020 1752 Full Code [id number]  Ivor Costa, MD ED   01/21/2020 1606 01/21/2020 1744 DNR [id number]  Ivor Costa, MD ED   01/21/2018 0323 01/22/2018 1927 Full Code [id number]  Harrie Foreman, MD Inpatient   12/03/2017 1007 12/03/2017 1517 Full Code [id number]  Delana Meyer, [name], MD Inpatient   04/04/2017 1327 04/06/2017 1439 DNR [id number]  Hillary Bow, MD ED   02/25/2017 1605 02/25/2017 2026 Full Code [id number]  Deboraha Sprang, MD Inpatient   Advance Care Planning Activity    Advance Directive Documentation   Flowsheet Row Most Recent Value  Type of Advance Directive Healthcare Power of Attorney, Living will  Pre-existing out of facility DNR order (yellow form or pink MOST form) --  "MOST" Form in Place? --       TOTAL TIME TAKING CARE OF THIS PATIENT: *35* minutes.    Fritzi Mandes M.D  [facility]    CC: Primary care physician; [facility]

## 2020-01-26 NOTE — Progress Notes (Signed)
[place]  A and O x 4. VSS. Pt tolerating diet well. No complaints of pain or nausea. IV removed intact, prescriptions given. Pt voiced understanding of discharge instructions with no further questions. Pt discharged via wheelchair with NT.    Allergies as of 01/26/2020      Reactions   Codeine Other (See Comments)   Made her feel crazy   Morphine Other (See Comments)   Made her feel crazy   Zyrtec [cetirizine] Other (See Comments)   Causes facial numbness      Medication List    STOP taking these medications   apixaban 2.5 MG Tabs tablet Commonly known as: Eliquis     TAKE these medications   acetaminophen 500 MG tablet Commonly known as: TYLENOL Take 500 mg by mouth every 6 (six) hours as needed for moderate pain or headache.   albuterol 108 (90 Base) MCG/ACT inhaler Commonly known as: ProAir HFA Inhale 2 puffs into the lungs every 6 (six) hours as needed for wheezing or shortness of breath.   Allergy Relief 10 MG tablet Generic drug: loratadine Take 10 mg by mouth daily.   amLODipine 5 MG tablet Commonly known as: NORVASC Take 1 tablet (5 mg total) by mouth daily. Please call to schedule office visit for further refills.   atenolol 25 MG tablet Commonly known as: TENORMIN Take 1 tablet (25 mg total) by mouth daily. Please call to schedule office visit for further refills.   cholecalciferol 25 MCG (1000 UNIT) tablet Commonly known as: VITAMIN D3 Take 1,000 Units by mouth daily.   dextromethorphan-guaiFENesin 30-600 MG 12hr tablet Commonly known as: MUCINEX DM Take 1 tablet by mouth 2 (two) times daily as needed for cough.   Fluticasone-Salmeterol 250-50 MCG/DOSE Aepb Commonly known as: Advair Diskus Inhale 1 puff into the lungs in the morning and at bedtime.   furosemide 20 MG tablet Commonly known as: LASIX Take 1 tablet (20 mg total) by mouth as needed (for shortness of breath/edema).   melatonin 3 MG Tabs tablet Take 1 tablet (3 mg) by mouth once  daily at bedtime as needed for sleep   predniSONE 10 MG tablet Commonly known as: DELTASONE Take 5 tablets (50 mg total) by mouth daily with breakfast. Start taking on: January 27, 2020   rosuvastatin 5 MG tablet Commonly known as: CRESTOR Take 1 tablet (5 mg total) by mouth daily.   senna-docusate 8.6-50 MG tablet Commonly known as: Senokot-S Take 2 tablets by mouth once daily at bedtime as needed for constipation   tamsulosin 0.4 MG Caps capsule Commonly known as: FLOMAX Take 1 capsule (0.4 mg total) by mouth daily.   vitamin B-12 1000 MCG tablet Commonly known as: CYANOCOBALAMIN Take 1 tablet (1000 mg) by mouth once daily at bedtime            Durable Medical Equipment  (From admission, onward)         Start     Ordered   01/25/20 1528  For home use only DME oxygen  Once       Question Answer Comment  Length of Need 6 Months   Mode or (Route) Nasal cannula   Liters per Minute 2   Frequency Continuous (stationary and portable oxygen unit needed)   Oxygen conserving device Yes   Oxygen delivery system Gas      01/25/20 1528          Vitals:   01/26/20 0532 01/26/20 0900  BP: (!) 142/73 (!) 154/73  Pulse: 70 70  Resp: 18 18  Temp: 97.8 F (36.6 C) 98.1 F (36.7 C)  SpO2: 97% 95%    Francesco Sor

## 2020-01-27 DIAGNOSIS — I4891 Unspecified atrial fibrillation: Secondary | ICD-10-CM | POA: Diagnosis not present

## 2020-01-27 DIAGNOSIS — Z9981 Dependence on supplemental oxygen: Secondary | ICD-10-CM | POA: Diagnosis not present

## 2020-01-27 DIAGNOSIS — Z483 Aftercare following surgery for neoplasm: Secondary | ICD-10-CM | POA: Diagnosis not present

## 2020-01-27 DIAGNOSIS — C679 Malignant neoplasm of bladder, unspecified: Secondary | ICD-10-CM | POA: Diagnosis not present

## 2020-01-27 DIAGNOSIS — M109 Gout, unspecified: Secondary | ICD-10-CM | POA: Diagnosis not present

## 2020-01-27 DIAGNOSIS — I129 Hypertensive chronic kidney disease with stage 1 through stage 4 chronic kidney disease, or unspecified chronic kidney disease: Secondary | ICD-10-CM | POA: Diagnosis not present

## 2020-01-27 DIAGNOSIS — N1831 Chronic kidney disease, stage 3a: Secondary | ICD-10-CM | POA: Diagnosis not present

## 2020-01-27 DIAGNOSIS — I714 Abdominal aortic aneurysm, without rupture: Secondary | ICD-10-CM | POA: Diagnosis not present

## 2020-01-27 DIAGNOSIS — J441 Chronic obstructive pulmonary disease with (acute) exacerbation: Secondary | ICD-10-CM | POA: Diagnosis not present

## 2020-01-28 DIAGNOSIS — C679 Malignant neoplasm of bladder, unspecified: Secondary | ICD-10-CM | POA: Diagnosis not present

## 2020-01-28 DIAGNOSIS — J449 Chronic obstructive pulmonary disease, unspecified: Secondary | ICD-10-CM | POA: Diagnosis not present

## 2020-01-28 DIAGNOSIS — N1831 Chronic kidney disease, stage 3a: Secondary | ICD-10-CM | POA: Diagnosis not present

## 2020-01-28 DIAGNOSIS — I129 Hypertensive chronic kidney disease with stage 1 through stage 4 chronic kidney disease, or unspecified chronic kidney disease: Secondary | ICD-10-CM | POA: Diagnosis not present

## 2020-01-28 DIAGNOSIS — M109 Gout, unspecified: Secondary | ICD-10-CM | POA: Diagnosis not present

## 2020-01-28 DIAGNOSIS — I714 Abdominal aortic aneurysm, without rupture: Secondary | ICD-10-CM | POA: Diagnosis not present

## 2020-01-28 DIAGNOSIS — Z483 Aftercare following surgery for neoplasm: Secondary | ICD-10-CM | POA: Diagnosis not present

## 2020-01-28 DIAGNOSIS — J441 Chronic obstructive pulmonary disease with (acute) exacerbation: Secondary | ICD-10-CM | POA: Diagnosis not present

## 2020-01-28 DIAGNOSIS — Z9981 Dependence on supplemental oxygen: Secondary | ICD-10-CM | POA: Diagnosis not present

## 2020-01-28 DIAGNOSIS — I4891 Unspecified atrial fibrillation: Secondary | ICD-10-CM | POA: Diagnosis not present

## 2020-01-28 NOTE — Anesthesia Postprocedure Evaluation (Signed)
Anesthesia Post Note  Patient: VERMA GROTHAUS  Procedure(s) Performed: TRANSURETHRAL RESECTION OF BLADDER TUMOR (TURBT) (N/A Bladder) CYSTOSCOPY/RETROGRADE/URETEROSCOPY/STONE EXTRACTION WITH BASKETRIGHT STENT PLACEMENT (Right Ureter)  Patient location during evaluation: PACU Anesthesia Type: General Level of consciousness: awake and alert Pain management: pain level controlled Vital Signs Assessment: post-procedure vital signs reviewed and stable Respiratory status: spontaneous breathing, nonlabored ventilation, respiratory function stable and patient connected to nasal cannula oxygen Cardiovascular status: blood pressure returned to baseline and stable Postop Assessment: no apparent nausea or vomiting Anesthetic complications: no   No complications documented.   Last Vitals:  Vitals:   01/26/20 0532 01/26/20 0900  BP: (!) 142/73 (!) 154/73  Pulse: 70 70  Resp: 18 18  Temp: 36.6 C 36.7 C  SpO2: 97% 95%    Last Pain:  Vitals:   01/26/20 0740  TempSrc:   PainSc: 0-No pain                 Molli Barrows

## 2020-01-29 DIAGNOSIS — I4891 Unspecified atrial fibrillation: Secondary | ICD-10-CM | POA: Diagnosis not present

## 2020-01-29 DIAGNOSIS — J441 Chronic obstructive pulmonary disease with (acute) exacerbation: Secondary | ICD-10-CM | POA: Diagnosis not present

## 2020-01-29 DIAGNOSIS — I714 Abdominal aortic aneurysm, without rupture: Secondary | ICD-10-CM | POA: Diagnosis not present

## 2020-01-29 DIAGNOSIS — M109 Gout, unspecified: Secondary | ICD-10-CM | POA: Diagnosis not present

## 2020-01-29 DIAGNOSIS — C679 Malignant neoplasm of bladder, unspecified: Secondary | ICD-10-CM | POA: Diagnosis not present

## 2020-01-29 DIAGNOSIS — I129 Hypertensive chronic kidney disease with stage 1 through stage 4 chronic kidney disease, or unspecified chronic kidney disease: Secondary | ICD-10-CM | POA: Diagnosis not present

## 2020-01-29 DIAGNOSIS — Z483 Aftercare following surgery for neoplasm: Secondary | ICD-10-CM | POA: Diagnosis not present

## 2020-01-29 DIAGNOSIS — N1831 Chronic kidney disease, stage 3a: Secondary | ICD-10-CM | POA: Diagnosis not present

## 2020-01-29 DIAGNOSIS — Z9981 Dependence on supplemental oxygen: Secondary | ICD-10-CM | POA: Diagnosis not present

## 2020-01-30 ENCOUNTER — Inpatient Hospital Stay
Admission: EM | Admit: 2020-01-30 | Discharge: 2020-02-02 | DRG: 683 | Disposition: A | Discharge status: Skilled Nursing Facility | Payer: Medicare Other | Source: Skilled Nursing Facility | Attending: Internal Medicine | Admitting: Internal Medicine

## 2020-01-30 ENCOUNTER — Encounter: Payer: Self-pay | Admitting: Emergency Medicine

## 2020-01-30 ENCOUNTER — Inpatient Hospital Stay: Payer: Medicare Other

## 2020-01-30 ENCOUNTER — Other Ambulatory Visit: Payer: Self-pay

## 2020-01-30 ENCOUNTER — Emergency Department: Payer: Medicare Other

## 2020-01-30 DIAGNOSIS — N132 Hydronephrosis with renal and ureteral calculous obstruction: Secondary | ICD-10-CM | POA: Diagnosis present

## 2020-01-30 DIAGNOSIS — N261 Atrophy of kidney (terminal): Secondary | ICD-10-CM | POA: Diagnosis not present

## 2020-01-30 DIAGNOSIS — Z8249 Family history of ischemic heart disease and other diseases of the circulatory system: Secondary | ICD-10-CM

## 2020-01-30 DIAGNOSIS — J439 Emphysema, unspecified: Secondary | ICD-10-CM | POA: Diagnosis not present

## 2020-01-30 DIAGNOSIS — Z20822 Contact with and (suspected) exposure to covid-19: Secondary | ICD-10-CM | POA: Diagnosis not present

## 2020-01-30 DIAGNOSIS — D696 Thrombocytopenia, unspecified: Secondary | ICD-10-CM | POA: Diagnosis not present

## 2020-01-30 DIAGNOSIS — Z885 Allergy status to narcotic agent status: Secondary | ICD-10-CM

## 2020-01-30 DIAGNOSIS — I480 Paroxysmal atrial fibrillation: Secondary | ICD-10-CM | POA: Diagnosis not present

## 2020-01-30 DIAGNOSIS — N189 Chronic kidney disease, unspecified: Secondary | ICD-10-CM | POA: Diagnosis present

## 2020-01-30 DIAGNOSIS — N184 Chronic kidney disease, stage 4 (severe): Secondary | ICD-10-CM

## 2020-01-30 DIAGNOSIS — E785 Hyperlipidemia, unspecified: Secondary | ICD-10-CM | POA: Diagnosis not present

## 2020-01-30 DIAGNOSIS — I10 Essential (primary) hypertension: Secondary | ICD-10-CM | POA: Diagnosis not present

## 2020-01-30 DIAGNOSIS — Z7952 Long term (current) use of systemic steroids: Secondary | ICD-10-CM

## 2020-01-30 DIAGNOSIS — Z515 Encounter for palliative care: Secondary | ICD-10-CM | POA: Diagnosis not present

## 2020-01-30 DIAGNOSIS — I4430 Unspecified atrioventricular block: Secondary | ICD-10-CM | POA: Diagnosis present

## 2020-01-30 DIAGNOSIS — N1832 Chronic kidney disease, stage 3b: Secondary | ICD-10-CM | POA: Diagnosis present

## 2020-01-30 DIAGNOSIS — R5381 Other malaise: Secondary | ICD-10-CM | POA: Diagnosis not present

## 2020-01-30 DIAGNOSIS — Z8679 Personal history of other diseases of the circulatory system: Secondary | ICD-10-CM

## 2020-01-30 DIAGNOSIS — C661 Malignant neoplasm of right ureter: Secondary | ICD-10-CM | POA: Diagnosis present

## 2020-01-30 DIAGNOSIS — E8770 Fluid overload, unspecified: Secondary | ICD-10-CM | POA: Diagnosis present

## 2020-01-30 DIAGNOSIS — I442 Atrioventricular block, complete: Secondary | ICD-10-CM | POA: Diagnosis present

## 2020-01-30 DIAGNOSIS — I701 Atherosclerosis of renal artery: Secondary | ICD-10-CM | POA: Diagnosis not present

## 2020-01-30 DIAGNOSIS — Z888 Allergy status to other drugs, medicaments and biological substances status: Secondary | ICD-10-CM

## 2020-01-30 DIAGNOSIS — N201 Calculus of ureter: Secondary | ICD-10-CM | POA: Diagnosis not present

## 2020-01-30 DIAGNOSIS — R0602 Shortness of breath: Secondary | ICD-10-CM | POA: Diagnosis not present

## 2020-01-30 DIAGNOSIS — N281 Cyst of kidney, acquired: Secondary | ICD-10-CM | POA: Diagnosis not present

## 2020-01-30 DIAGNOSIS — I1310 Hypertensive heart and chronic kidney disease without heart failure, with stage 1 through stage 4 chronic kidney disease, or unspecified chronic kidney disease: Secondary | ICD-10-CM | POA: Diagnosis present

## 2020-01-30 DIAGNOSIS — Z936 Other artificial openings of urinary tract status: Secondary | ICD-10-CM

## 2020-01-30 DIAGNOSIS — N3941 Urge incontinence: Secondary | ICD-10-CM | POA: Diagnosis present

## 2020-01-30 DIAGNOSIS — I714 Abdominal aortic aneurysm, without rupture: Secondary | ICD-10-CM | POA: Diagnosis present

## 2020-01-30 DIAGNOSIS — R6889 Other general symptoms and signs: Secondary | ICD-10-CM | POA: Diagnosis not present

## 2020-01-30 DIAGNOSIS — N1330 Unspecified hydronephrosis: Secondary | ICD-10-CM | POA: Diagnosis not present

## 2020-01-30 DIAGNOSIS — M109 Gout, unspecified: Secondary | ICD-10-CM | POA: Diagnosis not present

## 2020-01-30 DIAGNOSIS — Z87442 Personal history of urinary calculi: Secondary | ICD-10-CM

## 2020-01-30 DIAGNOSIS — Z950 Presence of cardiac pacemaker: Secondary | ICD-10-CM

## 2020-01-30 DIAGNOSIS — I517 Cardiomegaly: Secondary | ICD-10-CM | POA: Diagnosis not present

## 2020-01-30 DIAGNOSIS — J90 Pleural effusion, not elsewhere classified: Secondary | ICD-10-CM | POA: Diagnosis not present

## 2020-01-30 DIAGNOSIS — D631 Anemia in chronic kidney disease: Secondary | ICD-10-CM | POA: Diagnosis present

## 2020-01-30 DIAGNOSIS — E876 Hypokalemia: Secondary | ICD-10-CM | POA: Diagnosis present

## 2020-01-30 DIAGNOSIS — Z66 Do not resuscitate: Secondary | ICD-10-CM | POA: Diagnosis not present

## 2020-01-30 DIAGNOSIS — K7460 Unspecified cirrhosis of liver: Secondary | ICD-10-CM | POA: Diagnosis not present

## 2020-01-30 DIAGNOSIS — Z743 Need for continuous supervision: Secondary | ICD-10-CM | POA: Diagnosis not present

## 2020-01-30 DIAGNOSIS — N179 Acute kidney failure, unspecified: Principal | ICD-10-CM | POA: Diagnosis present

## 2020-01-30 DIAGNOSIS — Z87891 Personal history of nicotine dependence: Secondary | ICD-10-CM

## 2020-01-30 DIAGNOSIS — Z9981 Dependence on supplemental oxygen: Secondary | ICD-10-CM

## 2020-01-30 DIAGNOSIS — Z79899 Other long term (current) drug therapy: Secondary | ICD-10-CM

## 2020-01-30 DIAGNOSIS — C679 Malignant neoplasm of bladder, unspecified: Secondary | ICD-10-CM | POA: Diagnosis present

## 2020-01-30 DIAGNOSIS — I129 Hypertensive chronic kidney disease with stage 1 through stage 4 chronic kidney disease, or unspecified chronic kidney disease: Secondary | ICD-10-CM | POA: Diagnosis not present

## 2020-01-30 DIAGNOSIS — C689 Malignant neoplasm of urinary organ, unspecified: Secondary | ICD-10-CM | POA: Diagnosis not present

## 2020-01-30 LAB — URINALYSIS, COMPLETE (UACMP) WITH MICROSCOPIC
Bilirubin Urine: NEGATIVE
Glucose, UA: NEGATIVE mg/dL
Ketones, ur: NEGATIVE mg/dL
Nitrite: NEGATIVE
Protein, ur: NEGATIVE mg/dL
Specific Gravity, Urine: 1.008 (ref 1.005–1.030)
Squamous Epithelial / HPF: NONE SEEN (ref 0–5)
pH: 5 (ref 5.0–8.0)

## 2020-01-30 LAB — COMPREHENSIVE METABOLIC PANEL
ALT: 16 U/L (ref 0–44)
AST: 20 U/L (ref 15–41)
Albumin: 3.6 g/dL (ref 3.5–5.0)
Alkaline Phosphatase: 65 U/L (ref 38–126)
Anion gap: 11 (ref 5–15)
BUN: 85 mg/dL — ABNORMAL HIGH (ref 8–23)
CO2: 22 mmol/L (ref 22–32)
Calcium: 8.7 mg/dL — ABNORMAL LOW (ref 8.9–10.3)
Chloride: 104 mmol/L (ref 98–111)
Creatinine, Ser: 3.96 mg/dL — ABNORMAL HIGH (ref 0.44–1.00)
GFR, Estimated: 10 mL/min — ABNORMAL LOW (ref >60)
Glucose, Bld: 104 mg/dL — ABNORMAL HIGH (ref 70–99)
Potassium: 3.3 mmol/L — ABNORMAL LOW (ref 3.5–5.1)
Sodium: 137 mmol/L (ref 135–145)
Total Bilirubin: 0.8 mg/dL (ref 0.3–1.2)
Total Protein: 6.4 g/dL — ABNORMAL LOW (ref 6.5–8.1)

## 2020-01-30 LAB — TROPONIN I (HIGH SENSITIVITY)
Troponin I (High Sensitivity): 49 ng/L — ABNORMAL HIGH (ref <18)
Troponin I (High Sensitivity): 51 ng/L — ABNORMAL HIGH (ref <18)

## 2020-01-30 LAB — MAGNESIUM: Magnesium: 1.8 mg/dL (ref 1.7–2.4)

## 2020-01-30 LAB — CBC
HCT: 32.9 % — ABNORMAL LOW (ref 36.0–46.0)
Hemoglobin: 10.9 g/dL — ABNORMAL LOW (ref 12.0–15.0)
MCH: 29.9 pg (ref 26.0–34.0)
MCHC: 33.1 g/dL (ref 30.0–36.0)
MCV: 90.1 fL (ref 80.0–100.0)
Platelets: 185 10*3/uL (ref 150–400)
RBC: 3.65 MIL/uL — ABNORMAL LOW (ref 3.87–5.11)
RDW: 13.6 % (ref 11.5–15.5)
WBC: 12.2 10*3/uL — ABNORMAL HIGH (ref 4.0–10.5)
nRBC: 0 % (ref 0.0–0.2)

## 2020-01-30 LAB — SARS CORONAVIRUS 2 (TAT 6-24 HRS): SARS Coronavirus 2: NEGATIVE

## 2020-01-30 LAB — BRAIN NATRIURETIC PEPTIDE: B Natriuretic Peptide: 576.3 pg/mL — ABNORMAL HIGH (ref 0.0–100.0)

## 2020-01-30 MED ORDER — SENNOSIDES-DOCUSATE SODIUM 8.6-50 MG PO TABS: 2.0000 | ORAL_TABLET | Freq: Every evening | ORAL | Status: DC | PRN

## 2020-01-30 MED ORDER — MAGNESIUM SULFATE IN D5W 1-5 GM/100ML-% IV SOLN
1.0000 g | Freq: Once | INTRAVENOUS | Status: AC
  Administered 2020-01-30: 1 g via INTRAVENOUS
  Filled 2020-01-30: qty 100

## 2020-01-30 MED ORDER — MIRABEGRON ER 25 MG PO TB24: 25.0000 mg | ORAL_TABLET | Freq: Every day | ORAL | Status: DC

## 2020-01-30 MED ORDER — MOMETASONE FURO-FORMOTEROL FUM 200-5 MCG/ACT IN AERO
2.0000 | INHALATION_SPRAY | Freq: Two times a day (BID) | RESPIRATORY_TRACT | Status: DC
  Administered 2020-01-30 – 2020-02-02 (×6): 2 via RESPIRATORY_TRACT
  Filled 2020-01-30: qty 8.8

## 2020-01-30 MED ORDER — MELATONIN 3 MG PO TABS
3.0000 mg | ORAL_TABLET | Freq: Every day | ORAL | Status: DC
  Administered 2020-01-30: 3 mg via ORAL
  Filled 2020-01-30 (×2): qty 1

## 2020-01-30 MED ORDER — SODIUM CHLORIDE 0.9 % IV SOLN: 250.0000 mL | INTRAVENOUS | Status: DC | PRN

## 2020-01-30 MED ORDER — ROSUVASTATIN CALCIUM 10 MG PO TABS
5.0000 mg | ORAL_TABLET | Freq: Every day | ORAL | Status: DC
  Administered 2020-01-30 – 2020-02-02 (×4): 5 mg via ORAL
  Filled 2020-01-30 (×5): qty 1

## 2020-01-30 MED ORDER — ATENOLOL 25 MG PO TABS
25.0000 mg | ORAL_TABLET | Freq: Every day | ORAL | Status: DC
  Administered 2020-01-30 – 2020-02-02 (×4): 25 mg via ORAL
  Filled 2020-01-30 (×4): qty 1

## 2020-01-30 MED ORDER — AMLODIPINE BESYLATE 5 MG PO TABS
5.0000 mg | ORAL_TABLET | Freq: Every day | ORAL | Status: DC
  Administered 2020-01-30 – 2020-02-02 (×4): 5 mg via ORAL
  Filled 2020-01-30 (×4): qty 1

## 2020-01-30 MED ORDER — SODIUM CHLORIDE 0.9 % IV BOLUS: 1000.0000 mL | Freq: Once | INTRAVENOUS | Status: DC

## 2020-01-30 MED ORDER — SODIUM CHLORIDE 0.9% FLUSH: 3.0000 mL | INTRAVENOUS | Status: DC | PRN

## 2020-01-30 MED ORDER — SODIUM CHLORIDE 0.9% FLUSH
3.0000 mL | Freq: Two times a day (BID) | INTRAVENOUS | Status: DC
  Administered 2020-01-30 – 2020-02-02 (×7): 3 mL via INTRAVENOUS

## 2020-01-30 MED ORDER — HEPARIN SODIUM (PORCINE) 5000 UNIT/ML IJ SOLN
5000.0000 [IU] | Freq: Three times a day (TID) | INTRAMUSCULAR | Status: DC
  Administered 2020-01-30 – 2020-02-02 (×8): 5000 [IU] via SUBCUTANEOUS
  Filled 2020-01-30 (×8): qty 1

## 2020-01-30 MED ORDER — TAMSULOSIN HCL 0.4 MG PO CAPS
0.4000 mg | ORAL_CAPSULE | Freq: Every day | ORAL | Status: DC
  Administered 2020-01-31 – 2020-02-02 (×3): 0.4 mg via ORAL
  Filled 2020-01-30 (×3): qty 1

## 2020-01-30 MED ORDER — OXYBUTYNIN CHLORIDE ER 5 MG PO TB24
5.0000 mg | ORAL_TABLET | Freq: Every day | ORAL | Status: DC
  Administered 2020-01-30 – 2020-02-01 (×3): 5 mg via ORAL
  Filled 2020-01-30 (×4): qty 1

## 2020-01-30 NOTE — ED Notes (Addendum)
EDP Isaacs at bedside. Pt family at bedside at this time

## 2020-01-30 NOTE — ED Notes (Signed)
Bladder scan completed, resulted in 148mL.

## 2020-01-30 NOTE — ED Provider Notes (Addendum)
[facility] Emergency Department Provider Note  ____________________________________________   Event Date/Time   First MD Initiated Contact with Patient 01/30/20 [phone]     (approximate)  I have reviewed the triage vital signs and the nursing notes.   HISTORY  Chief Complaint Shortness of Breath and Urinary Incontinence    HPI Jessica [name] is a 87 y.o. female  Here with generalized weakness, urinary incontinence. Pt reports her primary issue today is new, worsening urinary incontinence. Pt was just hospitalized for AKI, urinary retention. Reviewed patient's recent hospitalization. She was admitted for AKI complicated by urinary obstruction status post nephrostomy placement, thought due to recurrence of bladder cancer. She also had a COPD exacerbation and was treated for this at that time.  She states that over past 24 hr she has noticed increased urinary incontinence and urgency. She has been unable to control her bladder at all. This is new for her. She's had some R flank pain as well as nausea, poor appetite. She has ongoing SOB as well btu states her cough has not worsened, no increased sputum production. No known fevers, chills.   Past Medical History:  Diagnosis Date  . AAA (abdominal aortic aneurysm) ([place])   . Atrioventricular block, complete ([place])   . Cardiac pacemaker in situ   . COPD (chronic obstructive pulmonary disease) ([place])   . Gout   . Hernia   . Hyperlipidemia   . Hypertension   . Kidney stone   . Presence of permanent cardiac pacemaker   . Rectal bleeding     Patient Active Problem List   Diagnosis Date Noted  . Hypoxia   . Acute renal failure superimposed on stage 3a chronic kidney disease ([place]) 01/21/2020  . Hypokalemia 01/21/2020  . Atypical chest pain 08/25/2019  . 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 Nephrolithiasis 01/18/2019  . Urinary tract infection symptoms 12/31/2018  . Bleeding hemorrhoids 02/27/2018  . Weight  loss 08/30/2017  . Low back pain 08/30/2017  . Depression, major, single episode, mild ([place]) 05/20/2017  . Fall 05/20/2017  . Hypoglycemia 04/04/2017  . Gout 06/05/2016  . AAA (abdominal aortic aneurysm) without rupture ([place]) 04/18/2016  . Loss of sense of smell 12/30/2015  . Memory difficulties 12/30/2015  . Injury of right lower extremity and cellulitis 11/29/2014  . 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 Renal artery stenosis ([place]) 09/15/2014  . 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 CN (constipation) 09/06/2014  . Change in stool caliber 09/06/2014  . Atherosclerosis of aorto-iliac bypass graft ([place]) 08/17/2014  . Shortness of breath 05/12/2014  . Conjunctival hemorrhage of right eye 08/13/2013  . Medicare annual wellness visit, subsequent 04/22/2013  . Allergic rhinitis 11/26/2012  . COPD exacerbation ([place]) 09/27/2011  . Hyperlipidemia 02/22/2011  . Long term current use of anticoagulant 04/12/2010  . HYPERTENSION, BENIGN 08/25/2009  . Atrial fibrillation ([employer]) 03/07/2009  . Carotid stenosis 08/24/2008  . AV BLOCK, COMPLETE 06/17/2008  . Aneurysm of abdominal vessel ([place]) 06/17/2008  . PACEMAKER-[place] 06/17/2008    Past Surgical History:  Procedure Laterality Date  . ABDOMINAL AORTIC ANEURYSM REPAIR     son denies  . CARDIOVERSION N/A 02/06/2019   Procedure: CARDIOVERSION;  Surgeon: Minna Merritts, MD;  Location: [facility] ORS;  Service: Cardiovascular;  Laterality: N/A;  . CYSTOSCOPY/RETROGRADE/URETEROSCOPY/STONE EXTRACTION WITH BASKET Right 01/24/2020   Procedure: CYSTOSCOPY/RETROGRADE/URETEROSCOPY/STONE EXTRACTION WITH BASKETRIGHT STENT PLACEMENT;  Surgeon: Billey Co, MD;  Location: [facility] ORS;  Service: Urology;  Laterality: Right;  . CYSTOSCOPY/URETEROSCOPY/HOLMIUM  LASER/STENT PLACEMENT Right 01/01/2019   Procedure: CYSTOSCOPY/URETEROSCOPY/HOLMIUM LASER/STENT PLACEMENT;  Surgeon: Billey Co, MD;  Location: [facility] ORS;  Service: Urology;  Laterality:  Right;  . HERNIA REPAIR    . ohter     growth on colon surgery  . PPM GENERATOR CHANGEOUT N/A 02/25/2017   Procedure: PPM GENERATOR CHANGEOUT;  Surgeon: Deboraha Sprang, MD;  Location: [facility];  Service: Cardiovascular;  Laterality: N/A;  . RENAL ANGIOGRAPHY Right 12/03/2017   Procedure: RENAL ANGIOGRAPHY;  Surgeon: Katha Cabal, MD;  Location: [facility];  Service: Cardiovascular;  Laterality: Right;  . TONSILLECTOMY    . TRANSURETHRAL RESECTION OF BLADDER TUMOR N/A 01/24/2020   Procedure: TRANSURETHRAL RESECTION OF BLADDER TUMOR (TURBT);  Surgeon: Billey Co, MD;  Location: [facility] ORS;  Service: Urology;  Laterality: N/A;    Prior to Admission medications   Medication Sig Start Date End Date Taking? Authorizing Provider  acetaminophen (TYLENOL) 500 MG tablet Take 500 mg by mouth every 6 (six) hours as needed for moderate pain or headache.    [provider]  albuterol (PROAIR HFA) 108 (90 Base) MCG/ACT inhaler Inhale 2 puffs into the lungs every 6 (six) hours as needed for wheezing or shortness of breath. 02/24/18   Jodelle Green, FNP  ALLERGY RELIEF 10 MG tablet Take 10 mg by mouth daily. 01/07/20   [provider]  amLODipine (NORVASC) 5 MG tablet Take 1 tablet (5 mg total) by mouth daily. Please call to schedule office visit for further refills. 01/30/19   Minna Merritts, MD  atenolol (TENORMIN) 25 MG tablet Take 1 tablet (25 mg total) by mouth daily. Please call to schedule office visit for further refills. 01/30/19   Minna Merritts, MD  cholecalciferol 25 MCG (1000 UT) TABS Take 1,000 Units by mouth daily.    [provider]  dextromethorphan-guaiFENesin (MUCINEX DM) 30-600 MG 12hr tablet Take 1 tablet by mouth 2 (two) times daily as needed for cough. 01/26/20   Fritzi Mandes, MD  Fluticasone-Salmeterol (ADVAIR DISKUS) 250-50 MCG/DOSE AEPB Inhale 1 puff into the lungs in the morning and at bedtime. 08/25/19   Leone Haven, MD   furosemide (LASIX) 20 MG tablet Take 1 tablet (20 mg total) by mouth as needed (for shortness of breath/edema). 07/28/19 12/30/19  Loel Dubonnet, NP  melatonin 3 MG TABS tablet Take 1 tablet (3 mg) by mouth once daily at bedtime as needed for sleep    [provider]  predniSONE (DELTASONE) 10 MG tablet Take 5 tablets (50 mg total) by mouth daily with breakfast. 01/27/20   Fritzi Mandes, MD  rosuvastatin (CRESTOR) 5 MG tablet Take 1 tablet (5 mg total) by mouth daily. 12/31/19   Loel Dubonnet, NP  senna-docusate (SENOKOT-S) 8.6-50 MG tablet Take 2 tablets by mouth once daily at bedtime as needed for constipation    [provider]  tamsulosin (FLOMAX) 0.4 MG CAPS capsule Take 1 capsule (0.4 mg total) by mouth daily. 12/25/18   Harvest Dark, MD  vitamin B-12 (CYANOCOBALAMIN) 1000 MCG tablet Take 1 tablet (1000 mg) by mouth once daily at bedtime    [provider]  citalopram (CELEXA) 10 MG tablet Take 1 tablet (10 mg total) by mouth daily. 10/08/18 11/12/18  Jodelle Green, FNP    Allergies Codeine, Morphine, and Zyrtec [cetirizine]  Family History  Problem Relation Age of Onset  . Heart disease Father   . Alcohol abuse Father   .  Cancer Brother   . Other Mother 1       MVA    Social History Social History   Tobacco Use  . Smoking status: Former Smoker    Packs/day: 1.50    Years: 50.00    Pack years: 75.00    Types: Cigarettes    Quit date: 10/03/1995    Years since quitting: 24.3  . Smokeless tobacco: Never Used  Vaping Use  . Vaping Use: Never used  Substance Use Topics  . Alcohol use: Not Currently  . Drug use: No    Review of Systems  Review of Systems  Constitutional: Positive for fatigue. Negative for fever.  HENT: Negative for congestion and sore throat.   Eyes: Negative for visual disturbance.  Respiratory: Positive for cough and shortness of breath.   Cardiovascular: Negative for chest pain.  Gastrointestinal: Negative for  abdominal pain, diarrhea, nausea and vomiting.  Genitourinary: Positive for flank pain and urgency.  Musculoskeletal: Negative for back pain and neck pain.  Skin: Negative for rash and wound.  Neurological: Positive for weakness.  All other systems reviewed and are negative.    ____________________________________________  PHYSICAL EXAM:      VITAL SIGNS: ED Triage Vitals  Enc Vitals Group     BP 01/30/20 0748 (!) 170/68     Pulse Rate 01/30/20 0748 70     Resp 01/30/20 0748 18     Temp 01/30/20 0748 98.1 F (36.7 C)     Temp Source 01/30/20 0748 Oral     SpO2 01/30/20 0748 97 %     Weight 01/30/20 0729 135 lb (61.2 kg)     Height 01/30/20 0729 5\' 7"  (1.702 m)     Head Circumference --      Peak Flow --      Pain Score 01/30/20 0729 6     Pain Loc --      Pain Edu? --      Excl. in [place]? --      Physical Exam Vitals and nursing note reviewed.  Constitutional:      General: She is not in acute distress.    Appearance: She is well-developed.  HENT:     Head: Normocephalic and atraumatic.  Eyes:     Conjunctiva/sclera: Conjunctivae normal.  Cardiovascular:     Rate and Rhythm: Normal rate and regular rhythm.     Heart sounds: Normal heart sounds. No murmur heard. No friction rub.  Pulmonary:     Effort: Pulmonary effort is normal. No respiratory distress.     Breath sounds: Examination of the right-lower field reveals rales. Examination of the left-lower field reveals rales. Rales present. No wheezing.  Abdominal:     General: There is no distension.     Palpations: Abdomen is soft.     Tenderness: There is abdominal tenderness (mild RUQ, R flank).  Musculoskeletal:     Cervical back: Neck supple.  Skin:    General: Skin is warm.     Capillary Refill: Capillary refill takes less than 2 seconds.  Neurological:     Mental Status: She is alert and oriented to person, place, and time.     Motor: No abnormal muscle tone.        ____________________________________________   LABS (all labs ordered are listed, but only abnormal results are displayed)  Labs Reviewed  COMPREHENSIVE METABOLIC PANEL - Abnormal; Notable for the following components:      Result Value   Potassium 3.3 (*)  Glucose, Bld 104 (*)    BUN 85 (*)    Creatinine, Ser 3.96 (*)    Calcium 8.7 (*)    Total Protein 6.4 (*)    GFR, Estimated 10 (*)    All other components within normal limits  CBC - Abnormal; Notable for the following components:   WBC 12.2 (*)    RBC 3.65 (*)    Hemoglobin 10.9 (*)    HCT 32.9 (*)    All other components within normal limits  URINALYSIS, COMPLETE (UACMP) WITH MICROSCOPIC - Abnormal; Notable for the following components:   Color, Urine STRAW (*)    APPearance CLEAR (*)    Hgb urine dipstick LARGE (*)    Leukocytes,Ua TRACE (*)    Bacteria, UA RARE (*)    All other components within normal limits  SARS CORONAVIRUS 2 (TAT 6-24 HRS)  BRAIN NATRIURETIC PEPTIDE  TROPONIN I (HIGH SENSITIVITY)  TROPONIN I (HIGH SENSITIVITY)    ____________________________________________  EKG: Atrial sensed, ventricular paced rhythm.  Ventricular rate 70.  PR 61, QRS 166, QTc 471.  No acute ST elevations or depressions. ________________________________________  RADIOLOGY All imaging, including plain films, CT scans, and ultrasounds, independently reviewed by me, and interpretations confirmed via formal radiology reads.  ED MD interpretation:   CXR: R pleural effusion, cardiomegaly  Official radiology report(s): DG Chest 2 View  Result Date: 01/30/2020 CLINICAL DATA:  87 year old female with shortness of breath. EXAM: CHEST - 2 VIEW COMPARISON:  01/21/2020 FINDINGS: Mild cardiomegaly, unchanged. The cardiomediastinal silhouette is otherwise within normal limits. Similar position of dual lead left subclavian approach pacemaker with an abandoned lead noted. Unchanged diffuse mild reticular opacities, most prominent  the lung bases. Flattening of the hemidiaphragms bilaterally with hyperlucency in apices. No new focal consolidations. Similar trace right pleural effusion. Atherosclerotic calcifications of the thoracic aorta. No acute osseous abnormality. IMPRESSION: Similar appearing trace right pleural effusion and unchanged cardiomegaly. Similar appearing emphysematous changes. Aortic Atherosclerosis (ICD10-I70.0) and Emphysema (ICD10-J43.9). Electronically Signed   By: Ruthann Cancer MD   On: 01/30/2020 08:39   CT Renal Stone Study  Result Date: 01/30/2020 CLINICAL DATA:  Flank pain, recent stent placement, recurrent AKI EXAM: CT ABDOMEN AND PELVIS WITHOUT CONTRAST TECHNIQUE: Multidetector CT imaging of the abdomen and pelvis was performed following the standard protocol without IV contrast. COMPARISON:  01/22/2020 FINDINGS: Lower chest: Increased small bilateral pleural effusions. Hepatobiliary: Stable appearance of the liver including probable cirrhotic morphology and probable perifissural focal fat. Punctate gallbladder calculus is no longer within the neck. No biliary dilatation. Pancreas: Unremarkable. Spleen: Unremarkable. Adrenals/Urinary Tract: Adrenals are unremarkable. Stable atrophic left kidney with several cysts. Multiple punctate calculi are again identified within the right lower pole collecting system. Interval placement of double-J nephroureteral stent. Proximal pigtail is within the upper pole collecting system and distal pigtail is within the bladder. No definite ureteral calculus. Similar right hydronephrosis. Tumor within the collecting system is not well evaluated on this study. Stomach/Bowel: Stomach is within normal limits. Bowel is normal in caliber. Sigmoid diverticulosis. Vascular/Lymphatic: Aortic atherosclerosis with aorta bi-iliac graft. 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 No enlarged lymph nodes identified. Reproductive: No pelvic mass. Other: Increased  small volume free fluid in the pelvis. Postoperative changes of prior ventral hernia repair. There is a broad-based ventral hernia containing a short segment of mid transverse colon. Small fat containing inguinal hernias. Musculoskeletal: No acute osseous abnormality. IMPRESSION: Interval placement of right double-J nephroureteral stent. No definite ureteral calculus. Similar  right hydronephrosis. Multiple punctate calculi remain present within the right lower pole collecting system. Tumor within the collecting system is not well evaluated on this study. Increased small volume free fluid primarily in the pelvis. Increased small bilateral pleural effusions. Stable findings of probable cirrhosis. Stable pseudoaneurysm of the abdominal aorta. Electronically Signed   By: Macy Mis M.D.   On: 01/30/2020 09:46    ____________________________________________  PROCEDURES   Procedure(s) performed (including Critical Care):  .1-3 Lead EKG Interpretation Performed by: Duffy Bruce, MD Authorized by: Duffy Bruce, MD     Interpretation: normal     ECG rate:  65-85   ECG rate assessment: normal     Rhythm: sinus rhythm     Ectopy: none     Conduction: normal   Comments:     Indication: Weakness    ____________________________________________  INITIAL IMPRESSION / MDM / [place] / ED COURSE  As part of my medical decision making, I reviewed the following data within the [place] notes reviewed and incorporated, Old chart reviewed, Notes from prior ED visits, and [place] Controlled Substance Database       *Jessica [name] was evaluated in Emergency Department on 01/30/2020 for the symptoms described in the history of present illness. She was evaluated in the context of the global COVID-19 pandemic, which necessitated consideration that the patient might be at risk for infection with the SARS-CoV-2 virus that causes COVID-19. Institutional protocols and  algorithms that pertain to the evaluation of patients at risk for COVID-19 are in a state of rapid change based on information released by regulatory bodies including the CDC and federal and state organizations. These policies and algorithms were followed during the patient's care in the ED.  Some ED evaluations and interventions may be delayed as a result of limited staffing during the pandemic.*     Medical Decision Making:  87 yo F here with generalized weakness, urinary incontinence and frequency. Pt is afebrile, non-toxic. Labs show acute on chronic kidney injury, with return of Cr to her pre-admission Cr. BUN elevated and she does appear dry clinically - suspect possible recurrent prerenal AoCKD. Otherwise, UA is without pyuria, doubt UTI. WBC 12.2k, no fevers however and no signs of infection as mentioned. Her SOB may be related to her CKD/mild fluid overload as well, so will be cautious with fluids for her AKI. Admit to medicine. CXR reviewed, shows no significant PNA. CT Joaquim Lai shows that stent is in place, no surgical abnormalities. Bl effusions noted however.  Per Hospitalist request, consulted Dr. Candiss Norse who will see and make recommendations re: management of AKI. Discussed with Dr. Bernardo Heater of Urology as well - could consider myrebetriq though risks of AMS in elderly, no surgical intervention indicated. ____________________________________________  FINAL CLINICAL IMPRESSION(S) / ED DIAGNOSES  Final diagnoses:  Acute renal failure superimposed on stage 4 chronic kidney disease, unspecified acute renal failure type (HCC)  Pleural effusion     MEDICATIONS GIVEN DURING THIS VISIT:  Medications  sodium chloride 0.9 % bolus 1,000 mL (has no administration in time range)     ED Discharge Orders    None       Note:  This document was prepared using Dragon voice recognition software and may include unintentional dictation errors.   Duffy Bruce, MD 01/30/20 1040    Duffy Bruce, MD 01/30/20 1040    Duffy Bruce, MD 01/30/20 1121

## 2020-01-30 NOTE — ED Notes (Signed)
Pt to CT at this time.

## 2020-01-30 NOTE — H&P (Addendum)
History and Physical    Jessica [name]:[id number] DOB: March 07, 1929 DOA: 01/30/2020  PCP: Orvis Brill, [facility]  Patient coming from: [facility]   Chief Complaint: urinary frequency  HPI: Jessica [name] is a 87 y.o. 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  Discharged on 1/4 of this year. 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 Pt has a left atrophic kidney. Was treated with turbt, debulking, stone removal, and a right ureteral stent was placed. Was also treated for copd exacerbation. Was followed by nephrology and urology during that hospital stay.  Patient reports that since discharged she is plagued by incontinence. Not painful, just not able to control when she urinates. No fevers. Gets occasional right sided colicky pain in RLQ. No n/v/d. No chest pain. Says breathing is more or less back to baseline. No dysuria. No flank pain.  ED Course:   CT stone study, labs show gfr of 10 from about 15 when left hospital 2 days ago. Urology and nephrology consulted.  Review of Systems: As per HPI otherwise 10 point review of systems negative.    Past Medical History:  Diagnosis Date  . AAA (abdominal aortic aneurysm) ([place])   . Atrioventricular block, complete ([place])   . Cardiac pacemaker in situ   . COPD (chronic obstructive pulmonary disease) ([place])   . Gout   . Hernia   . Hyperlipidemia   . Hypertension   . Kidney stone   . Presence of permanent cardiac pacemaker   . Rectal bleeding     Past Surgical History:  Procedure Laterality Date  . ABDOMINAL AORTIC ANEURYSM REPAIR     son denies  . CARDIOVERSION N/A 02/06/2019   Procedure: CARDIOVERSION;  Surgeon: Minna Merritts, MD;  Location: [facility];  Service: Cardiovascular;   Laterality: N/A;  . CYSTOSCOPY/RETROGRADE/URETEROSCOPY/STONE EXTRACTION WITH BASKET Right 01/24/2020   Procedure: CYSTOSCOPY/RETROGRADE/URETEROSCOPY/STONE EXTRACTION WITH BASKETRIGHT STENT PLACEMENT;  Surgeon: Billey Co, MD;  Location: [facility];  Service: Urology;  Laterality: Right;  . CYSTOSCOPY/URETEROSCOPY/HOLMIUM LASER/STENT PLACEMENT Right 01/01/2019   Procedure: CYSTOSCOPY/URETEROSCOPY/HOLMIUM LASER/STENT PLACEMENT;  Surgeon: Billey Co, MD;  Location: [facility];  Service: Urology;  Laterality: Right;  . HERNIA REPAIR    . ohter     growth on colon surgery  . PPM GENERATOR CHANGEOUT N/A 02/25/2017   Procedure: PPM GENERATOR CHANGEOUT;  Surgeon: Deboraha Sprang, MD;  Location: [place] [facility];  Service: Cardiovascular;  Laterality: N/A;  . RENAL ANGIOGRAPHY Right 12/03/2017   Procedure: RENAL ANGIOGRAPHY;  Surgeon: Katha Cabal, MD;  Location: [place] [facility];  Service: Cardiovascular;  Laterality: Right;  . TONSILLECTOMY    . TRANSURETHRAL RESECTION OF BLADDER TUMOR N/A 01/24/2020   Procedure: TRANSURETHRAL RESECTION OF BLADDER TUMOR (TURBT);  Surgeon: Billey Co, MD;  Location: [facility];  Service: Urology;  Laterality: N/A;     reports that she quit smoking about 24 years ago. Her smoking use included cigarettes. She has a 75.00 pack-year smoking history. She has never used smokeless tobacco. She reports previous alcohol use. She reports that she does not use drugs.  Allergies  Allergen Reactions  . Codeine Other (See Comments)    Made her feel crazy  . Morphine Other (See Comments)    Made her feel crazy  .  Zyrtec [Cetirizine] Other (See Comments)    Causes facial numbness    Family History  Problem Relation Age of Onset  . Heart disease Father   . Alcohol abuse Father   . Cancer Brother   . Other Mother 39       MVA    Prior to Admission medications   Medication Sig Start Date End Date Taking? Authorizing Provider  acetaminophen (TYLENOL)  500 MG tablet Take 500 mg by mouth every 6 (six) hours as needed for moderate pain or headache.    [provider]  albuterol (PROAIR HFA) 108 (90 Base) MCG/ACT inhaler Inhale 2 puffs into the lungs every 6 (six) hours as needed for wheezing or shortness of breath. 02/24/18   Jodelle Green, FNP  ALLERGY RELIEF 10 MG tablet Take 10 mg by mouth daily. 01/07/20   [provider]  amLODipine (NORVASC) 5 MG tablet Take 1 tablet (5 mg total) by mouth daily. Please call to schedule office visit for further refills. 01/30/19   Minna Merritts, MD  atenolol (TENORMIN) 25 MG tablet Take 1 tablet (25 mg total) by mouth daily. Please call to schedule office visit for further refills. 01/30/19   Minna Merritts, MD  cholecalciferol 25 MCG (1000 UT) TABS Take 1,000 Units by mouth daily.    [provider]  dextromethorphan-guaiFENesin (MUCINEX DM) 30-600 MG 12hr tablet Take 1 tablet by mouth 2 (two) times daily as needed for cough. 01/26/20   Fritzi Mandes, MD  Fluticasone-Salmeterol (ADVAIR DISKUS) 250-50 MCG/DOSE AEPB Inhale 1 puff into the lungs in the morning and at bedtime. 08/25/19   Leone Haven, MD  furosemide (LASIX) 20 MG tablet Take 1 tablet (20 mg total) by mouth as needed (for shortness of breath/edema). 07/28/19 12/30/19  Loel Dubonnet, NP  melatonin 3 MG TABS tablet Take 1 tablet (3 mg) by mouth once daily at bedtime as needed for sleep    [provider]  rosuvastatin (CRESTOR) 5 MG tablet Take 1 tablet (5 mg total) by mouth daily. 12/31/19   Loel Dubonnet, NP  senna-docusate (SENOKOT-S) 8.6-50 MG tablet Take 2 tablets by mouth once daily at bedtime as needed for constipation    [provider]  tamsulosin (FLOMAX) 0.4 MG CAPS capsule Take 1 capsule (0.4 mg total) by mouth daily. 12/25/18   Harvest Dark, MD  vitamin B-12 (CYANOCOBALAMIN) 1000 MCG tablet Take 1 tablet (1000 mg) by mouth once daily at bedtime    [provider]  citalopram  (CELEXA) 10 MG tablet Take 1 tablet (10 mg total) by mouth daily. 10/08/18 11/12/18  Jodelle Green, FNP    Physical Exam: Vitals:   01/30/20 0748 01/30/20 0912 01/30/20 1000 01/30/20 1100  BP: (!) 170/68 (!) 153/78 (!) 132/56 (!) 157/71  Pulse: 70 73 70 69  Resp: 18 (!) 23 (!) 21 (!) 23  Temp: 98.1 F (36.7 C)     TempSrc: Oral     SpO2: 97% 96% 97% 97%  Weight:      Height:        Constitutional: No acute distress Head: Atraumatic Eyes: Conjunctiva clear ENM: Moist mucous membranes. Normal dentition.  Neck: Supple Respiratory: Clear to auscultation bilaterally, no wheezing/rales/rhonchi. Normal respiratory effort. No accessory muscle use. . Cardiovascular: Regular rate and rhythm. Systolic murmur Abdomen: Non-tender, obese. No masses. No rebound or guarding. Positive bowel sounds. Musculoskeletal: No joint deformity upper and lower extremities. Normal ROM, no contractures. Normal muscle tone.  Skin:  No rashes, lesions, or ulcers.  Extremities: 1+ LE pitting edema. Palpable peripheral pulses. Neurologic: Alert, moving all 4 extremities. Psychiatric: Normal insight and judgement.   Labs on Admission: I have personally reviewed following labs and imaging studies  CBC: Recent Labs  Lab 01/30/20 0746  WBC 12.2*  HGB 10.9*  HCT 32.9*  MCV 90.1  PLT 254   Basic Metabolic Panel: Recent Labs  Lab 01/24/20 0422 01/25/20 0420 01/30/20 0746  NA 143  --  137  K 3.7  --  3.3*  CL 114*  --  104  CO2 17*  --  22  GLUCOSE 91  --  104*  BUN 65*  --  85*  CREATININE 2.99* 2.68* 3.96*  CALCIUM 8.6*  --  8.7*  MG  --   --  1.8   GFR: Estimated Creatinine Clearance: 9.5 mL/min (A) (by C-G formula based on SCr of 3.96 mg/dL (H)). Liver Function Tests: Recent Labs  Lab 01/30/20 0746  AST 20  ALT 16  ALKPHOS 65  BILITOT 0.8  PROT 6.4*  ALBUMIN 3.6   No results for input(s): LIPASE, AMYLASE in the last 168 hours. No results for input(s): AMMONIA in the last 168  hours. Coagulation Profile: No results for input(s): INR, PROTIME in the last 168 hours. Cardiac Enzymes: No results for input(s): CKTOTAL, CKMB, CKMBINDEX, TROPONINI in the last 168 hours. BNP (last 3 results) No results for input(s): PROBNP in the last 8760 hours. HbA1C: No results for input(s): HGBA1C in the last 72 hours. CBG: No results for input(s): GLUCAP in the last 168 hours. Lipid Profile: No results for input(s): CHOL, HDL, LDLCALC, TRIG, CHOLHDL, LDLDIRECT in the last 72 hours. Thyroid Function Tests: No results for input(s): TSH, T4TOTAL, FREET4, T3FREE, THYROIDAB in the last 72 hours. Anemia Panel: No results for input(s): VITAMINB12, FOLATE, FERRITIN, TIBC, IRON, RETICCTPCT in the last 72 hours. Urine analysis:    Component Value Date/Time   COLORURINE STRAW (A) 01/30/2020 0746   APPEARANCEUR CLEAR (A) 01/30/2020 0746   APPEARANCEUR Cloudy (A) 01/19/2019 1143   LABSPEC 1.008 01/30/2020 0746   PHURINE 5.0 01/30/2020 0746   GLUCOSEU NEGATIVE 01/30/2020 0746   GLUCOSEU NEGATIVE 03/06/2017 0930   HGBUR LARGE (A) 01/30/2020 0746   BILIRUBINUR NEGATIVE 01/30/2020 0746   BILIRUBINUR Negative 01/19/2019 1143   KETONESUR NEGATIVE 01/30/2020 0746   PROTEINUR NEGATIVE 01/30/2020 0746   UROBILINOGEN 0.2 08/30/2017 1108   UROBILINOGEN 0.2 03/06/2017 0930   NITRITE NEGATIVE 01/30/2020 0746   LEUKOCYTESUR TRACE (A) 01/30/2020 0746    Radiological Exams on Admission: DG Chest 2 View  Result Date: 01/30/2020 CLINICAL DATA:  87 year old female with shortness of breath. EXAM: CHEST - 2 VIEW COMPARISON:  01/21/2020 FINDINGS: Mild cardiomegaly, unchanged. The cardiomediastinal silhouette is otherwise within normal limits. Similar position of dual lead left subclavian approach pacemaker with an abandoned lead noted. Unchanged diffuse mild reticular opacities, most prominent the lung bases. Flattening of the hemidiaphragms bilaterally with hyperlucency in apices. No new focal  consolidations. Similar trace right pleural effusion. Atherosclerotic calcifications of the thoracic aorta. No acute osseous abnormality. IMPRESSION: Similar appearing trace right pleural effusion and unchanged cardiomegaly. Similar appearing emphysematous changes. Aortic Atherosclerosis (ICD10-I70.0) and Emphysema (ICD10-J43.9). Electronically Signed   By: Ruthann Cancer MD   On: 01/30/2020 08:39   CT Renal Stone Study  Result Date: 01/30/2020 CLINICAL DATA:  Flank pain, recent stent placement, recurrent AKI EXAM: CT ABDOMEN AND PELVIS WITHOUT CONTRAST TECHNIQUE: Multidetector CT imaging of the abdomen and  pelvis was performed following the standard protocol without IV contrast. COMPARISON:  01/22/2020 FINDINGS: Lower chest: Increased small bilateral pleural effusions. Hepatobiliary: Stable appearance of the liver including probable cirrhotic morphology and probable perifissural focal fat. Punctate gallbladder calculus is no longer within the neck. No biliary dilatation. Pancreas: Unremarkable. Spleen: Unremarkable. Adrenals/Urinary Tract: Adrenals are unremarkable. Stable atrophic left kidney with several cysts. Multiple punctate calculi are again identified within the right lower pole collecting system. Interval placement of double-J nephroureteral stent. Proximal pigtail is within the upper pole collecting system and distal pigtail is within the bladder. No definite ureteral calculus. Similar right hydronephrosis. Tumor within the collecting system is not well evaluated on this study. Stomach/Bowel: Stomach is within normal limits. Bowel is normal in caliber. Sigmoid diverticulosis. Vascular/Lymphatic: Aortic atherosclerosis with aorta bi-iliac graft. 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 No enlarged lymph nodes identified. Reproductive: No pelvic mass. Other: Increased small volume free fluid in the pelvis. Postoperative changes of prior ventral hernia repair. There is a  broad-based ventral hernia containing a short segment of mid transverse colon. Small fat containing inguinal hernias. Musculoskeletal: No acute osseous abnormality. IMPRESSION: Interval placement of right double-J nephroureteral stent. No definite ureteral calculus. Similar right hydronephrosis. Multiple punctate calculi remain present within the right lower pole collecting system. Tumor within the collecting system is not well evaluated on this study. Increased small volume free fluid primarily in the pelvis. Increased small bilateral pleural effusions. Stable findings of probable cirrhosis. Stable pseudoaneurysm of the abdominal aorta. Electronically Signed   By: Macy Mis M.D.   On: 01/30/2020 09:46    EKG: Independently reviewed. Paced rhythm  Assessment/Plan Active Problems:   Acute kidney injury superimposed on chronic kidney disease ([place])    # Urge incontinence  CT stone study with finds stent in place, no obstructing stone seen. Urinalysis not strongly suggestive of infection, pt is afebrile and asymtpomatic other than incontinence, think her urge incontinence is likely driven by presence of stent and her cancer. Elevated wbcs, more likely explained by recent corticosteroids, procedure. - urology consulted, will appreciate formal recs when available - initial thought to trial myrbetriq but apparently contraindicated with gfr under 15 and pharmacy advises against. Will instead trial oxybutynin - f/u urine culture, holding on abx for now. - cont flomax  # Acute kidney injury on ckd # History renal artery stenosis Baseline ckd 3 with gfr of 30s, gfr at presentation last week was 11, was 15 on discharge, down to 10 today. Atrophic left kidney, urotherlial carcinoma of right ureter with stent placement and turbt last week on the right. Possible ongoing post-renal obstruction though no sig hydro seen on CT. ATN also possible. Also appears may be some degree of prerenal etiology as pt may  have been taking lasix. k low, no acidosis, no indication for RRT at this time. Finally, pt does have history of renal artery stenosis s/p stenting - hold lasix - nephrology consulting, appreciate recs - will check renal artery duplex scan  # hypokalemia  Mild, 3.3, in setting of aki on ckd - monitor, holding on replacement given aki - mg 1.8, will give 1 g IV  # A fib eliquis held at last hospitalization given risk for bleeding - cont home atenolol  # HTN Here bp mildly elevated - resume home atenolol, amlodipine  # COPD With recent exacerbation, now appears to be back to baseline - dulera for home advair  # RAS s/p stent # AAA/PAD s/p stent -  cont home rosuvastatin  DVT prophylaxis: heparin Code Status: dnr, confirmed w/ son  Family Communication: son updated telephonically  Consults called: nephrology, urology    Status is: Inpatient  Remains inpatient appropriate because:Inpatient level of care appropriate due to severity of illness   Dispo: The patient is from: ALF              Anticipated d/c is to: ALF              Anticipated d/c date is: 3 days              Patient currently is not medically stable to d/c.        Desma Maxim MD [facility] Pager [phone]  If 7PM-7AM, please contact night-coverage [email or web address] Password [facility]  01/30/2020, 12:40 PM

## 2020-01-30 NOTE — ED Notes (Signed)
Lab at bedside at this time.  

## 2020-01-30 NOTE — ED Notes (Signed)
IV team consult placed at this time. °

## 2020-01-30 NOTE — ED Notes (Signed)
PT back from CT at this time 

## 2020-01-30 NOTE — ED Notes (Signed)
Pt cleaned after incontinence episode by this RN. Pt provided clean linens and chux and changed out of personal clothes into clean gown and brief at this time.External female catheter placed at this time

## 2020-01-30 NOTE — ED Notes (Signed)
Pt back from xray at this time.

## 2020-01-30 NOTE — ED Notes (Signed)
Nephrology at bedside at this time.  

## 2020-01-30 NOTE — ED Triage Notes (Signed)
Pt to ER via EMS from [facility] with c/o new onset urinary incontinence.  Pt had stent placed last Sunday into right kidney.  Pt had hx of SHOB prior to placement that initially resolved, but has returned.  PT states SHOB is no worse than before.  Pt states RLQ pain intermittently.

## 2020-01-30 NOTE — ED Notes (Signed)
Pt to xray at this time.

## 2020-01-30 NOTE — ED Notes (Signed)
Lab called to come draw bloodwork at this time. Per Jessica [name] in lab, will send someone

## 2020-01-30 NOTE — Progress Notes (Signed)
[facility]  ROUNDING NOTE   Subjective:   Patient presents via EMS from [facility] with new onset of urinary incontinence.  Patient was recently discharged from this hospital on January 4.  On January 6, she had TURBT, cystoscopy with stone extraction and stent placement in the right ureter.  Patient has right upper tract urothelial carcinoma and bladder tumor which was thought to be causing right hydronephrosis. Serum creatinine has improved to 2.68 on January 3 but today's presenting creatinine is up to 3.96 with BUN of 85 Potassium is low at 3.3 She underwent a CT scan of the abdomen this morning which shows no definite ureteral calculus, similar right hydronephrosis, multiple punctate calcified calculi within the right lower pole collecting system small bilateral pleural effusions and cirrhosis She denies any dysuria Her biggest complaint was frequency of urination She was taking Lasix prior to admission.  She states she took it in the morning  Objective:  Vital signs in last 24 hours:  Temp:  [98.1 F (36.7 C)] 98.1 F (36.7 C) (01/08 0748) Pulse Rate:  [69-73] 69 (01/08 1100) Resp:  [18-23] 23 (01/08 1100) BP: (132-170)/(56-78) 157/71 (01/08 1100) SpO2:  [96 %-97 %] 97 % (01/08 1100) Weight:  [61.2 kg] 61.2 kg (01/08 0729)  Weight change:  Filed Weights   01/30/20 0729  Weight: 61.2 kg    Intake/Output: No intake/output data recorded.   Intake/Output this shift:  No intake/output data recorded.  Physical Exam: General:  No acute distress, laying in the bed  HEENT  anicteric, moist oral mucous membrane  Pulm/lungs  normal breathing effort, room air, mild wheezing  CVS/Heart  irregular rhythm, no rub or gallop  Abdomen:   Soft, nontender  Extremities:  1+ peripheral edema over the ankles bilaterally  Neurologic:  Alert, oriented, able to follow commands  Skin:  No acute rashes    Basic Metabolic Panel: Recent Labs  Lab  01/24/20 0422 01/25/20 0420 01/30/20 0746  NA 143  --  137  K 3.7  --  3.3*  CL 114*  --  104  CO2 17*  --  22  GLUCOSE 91  --  104*  BUN 65*  --  85*  CREATININE 2.99* 2.68* 3.96*  CALCIUM 8.6*  --  8.7*    Liver Function Tests: Recent Labs  Lab 01/30/20 0746  AST 20  ALT 16  ALKPHOS 65  BILITOT 0.8  PROT 6.4*  ALBUMIN 3.6   No results for input(s): LIPASE, AMYLASE in the last 168 hours. No results for input(s): AMMONIA in the last 168 hours.  CBC: Recent Labs  Lab 01/30/20 0746  WBC 12.2*  HGB 10.9*  HCT 32.9*  MCV 90.1  PLT 185    Cardiac Enzymes: No results for input(s): CKTOTAL, CKMB, CKMBINDEX, TROPONINI in the last 168 hours.  BNP: Invalid input(s): POCBNP  CBG: No results for input(s): GLUCAP in the last 168 hours.  Microbiology: Results for orders placed or performed during the hospital encounter of 01/21/20  Resp Panel by RT-PCR (Flu A&B, Covid) Nasopharyngeal Swab     Status: None   Collection Time: 01/21/20  2:05 PM   Specimen: Nasopharyngeal Swab; Nasopharyngeal(NP) swabs in vial transport medium  Result Value Ref Range Status   SARS Coronavirus 2 by RT PCR NEGATIVE NEGATIVE Final    Comment: (NOTE) SARS-CoV-2 target nucleic acids are NOT DETECTED.  The SARS-CoV-2 RNA is generally detectable in upper respiratory specimens during the acute phase of infection. The  lowest concentration of SARS-CoV-2 viral copies this assay can detect is 138 copies/mL. A negative result does not preclude SARS-Cov-2 infection and should not be used as the sole basis for treatment or other patient management decisions. A negative result may occur with  improper specimen collection/handling, submission of specimen other than nasopharyngeal swab, presence of viral mutation(s) within the areas targeted by this assay, and inadequate number of viral copies(<138 copies/mL). A negative result must be combined with clinical observations, patient history, and  epidemiological information. The expected result is Negative.  Fact Sheet for Patients:  EntrepreneurPulse.com.au  Fact Sheet for Healthcare Providers:  IncredibleEmployment.be  This test is no t yet approved or cleared by the [place] FDA and  has been authorized for detection and/or diagnosis of SARS-CoV-2 by FDA under an Emergency Use Authorization (EUA). This EUA will remain  in effect (meaning this test can be used) for the duration of the COVID-19 declaration under Section 564(b)(1) of the Act, 21 U.S.C.section 360bbb-3(b)(1), unless the authorization is terminated  or revoked sooner.       Influenza A by PCR NEGATIVE NEGATIVE Final   Influenza B by PCR NEGATIVE NEGATIVE Final    Comment: (NOTE) The Xpert Xpress SARS-CoV-2/FLU/RSV plus assay is intended as an aid in the diagnosis of influenza from Nasopharyngeal swab specimens and should not be used as a sole basis for treatment. Nasal washings and aspirates are unacceptable for Xpert Xpress SARS-CoV-2/FLU/RSV testing.  Fact Sheet for Patients: EntrepreneurPulse.com.au  Fact Sheet for Healthcare Providers: IncredibleEmployment.be  This test is not yet approved or cleared by the [place] FDA and has been authorized for detection and/or diagnosis of SARS-CoV-2 by FDA under an Emergency Use Authorization (EUA). This EUA will remain in effect (meaning this test can be used) for the duration of the COVID-19 declaration under Section 564(b)(1) of the Act, 21 U.S.C. section 360bbb-3(b)(1), unless the authorization is terminated or revoked.  Performed at [facility], [address] [place], [place] [address]   Surgical PCR screen     Status: None   Collection Time: 01/24/20  7:47 AM   Specimen: Nasal Mucosa; Nasal Swab  Result Value Ref Range Status   MRSA, PCR NEGATIVE NEGATIVE Final   Staphylococcus aureus NEGATIVE NEGATIVE  Final    Comment: (NOTE) The Xpert SA Assay (FDA approved for NASAL specimens in patients 31 years of age and older), is one component of a comprehensive surveillance program. It is not intended to diagnose infection nor to guide or monitor treatment. Performed at [facility], [address] [place], [place] [address]     Coagulation Studies: No results for input(s): LABPROT, INR in the last 72 hours.  Urinalysis: Recent Labs    01/30/20 0746  COLORURINE STRAW*  LABSPEC 1.008  PHURINE 5.0  GLUCOSEU NEGATIVE  HGBUR LARGE*  BILIRUBINUR NEGATIVE  KETONESUR NEGATIVE  PROTEINUR NEGATIVE  NITRITE NEGATIVE  LEUKOCYTESUR TRACE*      Imaging: DG Chest 2 View  Result Date: 01/30/2020 CLINICAL DATA:  87 year old female with shortness of breath. EXAM: CHEST - 2 VIEW COMPARISON:  01/21/2020 FINDINGS: Mild cardiomegaly, unchanged. The cardiomediastinal silhouette is otherwise within normal limits. Similar position of dual lead left subclavian approach pacemaker with an abandoned lead noted. Unchanged diffuse mild reticular opacities, most prominent the lung bases. Flattening of the hemidiaphragms bilaterally with hyperlucency in apices. No new focal consolidations. Similar trace right pleural effusion. Atherosclerotic calcifications of the thoracic aorta. No acute osseous abnormality. IMPRESSION: Similar appearing trace right  pleural effusion and unchanged cardiomegaly. Similar appearing emphysematous changes. Aortic Atherosclerosis (ICD10-I70.0) and Emphysema (ICD10-J43.9). Electronically Signed   By: Ruthann Cancer MD   On: 01/30/2020 08:39   CT Renal Stone Study  Result Date: 01/30/2020 CLINICAL DATA:  Flank pain, recent stent placement, recurrent AKI EXAM: CT ABDOMEN AND PELVIS WITHOUT CONTRAST TECHNIQUE: Multidetector CT imaging of the abdomen and pelvis was performed following the standard protocol without IV contrast. COMPARISON:  01/22/2020 FINDINGS: Lower chest: Increased  small bilateral pleural effusions. Hepatobiliary: Stable appearance of the liver including probable cirrhotic morphology and probable perifissural focal fat. Punctate gallbladder calculus is no longer within the neck. No biliary dilatation. Pancreas: Unremarkable. Spleen: Unremarkable. Adrenals/Urinary Tract: Adrenals are unremarkable. Stable atrophic left kidney with several cysts. Multiple punctate calculi are again identified within the right lower pole collecting system. Interval placement of double-J nephroureteral stent. Proximal pigtail is within the upper pole collecting system and distal pigtail is within the bladder. No definite ureteral calculus. Similar right hydronephrosis. Tumor within the collecting system is not well evaluated on this study. Stomach/Bowel: Stomach is within normal limits. Bowel is normal in caliber. Sigmoid diverticulosis. Vascular/Lymphatic: Aortic atherosclerosis with aorta bi-iliac graft. 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 No enlarged lymph nodes identified. Reproductive: No pelvic mass. Other: Increased small volume free fluid in the pelvis. Postoperative changes of prior ventral hernia repair. There is a broad-based ventral hernia containing a short segment of mid transverse colon. Small fat containing inguinal hernias. Musculoskeletal: No acute osseous abnormality. IMPRESSION: Interval placement of right double-J nephroureteral stent. No definite ureteral calculus. Similar right hydronephrosis. Multiple punctate calculi remain present within the right lower pole collecting system. Tumor within the collecting system is not well evaluated on this study. Increased small volume free fluid primarily in the pelvis. Increased small bilateral pleural effusions. Stable findings of probable cirrhosis. Stable pseudoaneurysm of the abdominal aorta. Electronically Signed   By: Macy Mis M.D.   On: 01/30/2020 09:46     Medications:     Scheduled Meds: Continuous Infusions: PRN Meds:.    Assessment/ Plan:   [name] is a 87 y.o. white female with atrophic left kidney, urothelial cell carcinoma, nephrolithiasis, abdominal aortic aneurysm, complete heart block status post pacemaker placement, COPD, chronic home O2, hypertension, hyperlipidemia who was admitted to [facility] on 01/30/2020  Recent Right ureteral stent placed on 1/2 by Dr. Diamantina [facility].   1.  Acute kidney injury on chronic kidney disease stage IIIb: baseline creatinine of 1.55, GFR of 32 on 12/28/19.  Lab Results  Component Value Date   CREATININE 3.96 (H) 01/30/2020   CREATININE 2.68 (H) 01/25/2020   CREATININE 2.99 (H) 01/24/2020   No intake/output data recorded. Urinalysis today shows large hemoglobin, negative for protein Imaging: CT report as above  AKI is likely multifactorial with possible contribution from volume depletion as patient was on Lasix as outpatient  Plan: Hold Lasix Allow liberal oral intake Electrolytes and volume status are acceptable.  No acute indication for dialysis at present.  2.  Hypokalemia Expected to improve with normal diet   3.  Right hydronephrosis Due to bladder tumor and right upper tract urothelial carcinoma Status post right ureteral stent placement on January 24, 2019 Patient complaining of frequency of voiding.  Urology evaluation pending.   LOS: 0 Trenten Watchman 1/8/202211:07 AM

## 2020-01-31 ENCOUNTER — Encounter: Payer: Self-pay | Admitting: Obstetrics and Gynecology

## 2020-01-31 DIAGNOSIS — N179 Acute kidney failure, unspecified: Secondary | ICD-10-CM | POA: Diagnosis not present

## 2020-01-31 DIAGNOSIS — N189 Chronic kidney disease, unspecified: Secondary | ICD-10-CM | POA: Diagnosis not present

## 2020-01-31 LAB — CBC
HCT: 31.2 % — ABNORMAL LOW (ref 36.0–46.0)
Hemoglobin: 10.5 g/dL — ABNORMAL LOW (ref 12.0–15.0)
MCH: 30.4 pg (ref 26.0–34.0)
MCHC: 33.7 g/dL (ref 30.0–36.0)
MCV: 90.4 fL (ref 80.0–100.0)
Platelets: 121 10*3/uL — ABNORMAL LOW (ref 150–400)
RBC: 3.45 MIL/uL — ABNORMAL LOW (ref 3.87–5.11)
RDW: 13.8 % (ref 11.5–15.5)
WBC: 8.4 10*3/uL (ref 4.0–10.5)
nRBC: 0 % (ref 0.0–0.2)

## 2020-01-31 LAB — URINE CULTURE: Culture: NO GROWTH

## 2020-01-31 LAB — BASIC METABOLIC PANEL
Anion gap: 10 (ref 5–15)
BUN: 93 mg/dL — ABNORMAL HIGH (ref 8–23)
CO2: 21 mmol/L — ABNORMAL LOW (ref 22–32)
Calcium: 8.1 mg/dL — ABNORMAL LOW (ref 8.9–10.3)
Chloride: 110 mmol/L (ref 98–111)
Creatinine, Ser: 3.96 mg/dL — ABNORMAL HIGH (ref 0.44–1.00)
GFR, Estimated: 10 mL/min — ABNORMAL LOW (ref >60)
Glucose, Bld: 84 mg/dL (ref 70–99)
Potassium: 3.4 mmol/L — ABNORMAL LOW (ref 3.5–5.1)
Sodium: 141 mmol/L (ref 135–145)

## 2020-01-31 MED ORDER — MELATONIN 5 MG PO TABS
2.5000 mg | ORAL_TABLET | Freq: Every day | ORAL | Status: DC
  Administered 2020-01-31 – 2020-02-01 (×2): 2.5 mg via ORAL
  Filled 2020-01-31 (×2): qty 1

## 2020-01-31 NOTE — Progress Notes (Signed)
PROGRESS NOTE    Jessica [name]  MPN:[id number] DOB: 1929-10-07 DOA: 01/30/2020 PCP: Orvis Brill, [facility]   Chief Complaint  Patient presents with  . Shortness of Breath  . Urinary Incontinence  Brief Narrative: 87 year old female with urothelial carcinoma on the right AV block status post pacemaker, A. fib, AAA status post repair, COPD, RLS status post stenting, CKD stage III, atrophic left kidney , recently admitted for AKI thought to be from progressing urothelial carcinoma in the right causing hydronephrosis status post TURBT, debulking, stent removal and right ureteral stent placement , and also treated for COPD flare up and discharged 1/4 presents with urinary frequency, inability to control the bladder In the ED CT stone no definite ureteral calculus, similar right hydronephrosis, right double-J nephroureteral stent, multiple punctate calculi in the right lower pole tumor in the collecting system not well evaluated, increased to small volume free fluid in pelvis.  Labs showed AKI. Nephrology and urology was consulted and patient was admitted  Subjective: Resting, having her meals. No complaints. Not in distress, purewick in place. Some nausea this am.   Assessment & Plan:  Urge incontinence: No obstructing stone with CT stone not strongly suggestive of infection urology consulted in the setting of patient's urothelial carcinoma. unable to use Myrbetriq as GFR <15, isntead placed on oxybutynin, Flomax.  Follow-up urine culture hold antibiotics for now  AKI on CKD stage III b: Baseline creatinine around 1.5 GFR 33 on 12/6- recent bun/creatinine 65/2.6 on 01/25/19: On admission creat 3.9 BUN 85.  UA large hemoglobin negative for protein CT no signs of obstruction, suspecting likely multifactorial from volume depletion, patient on Lasix.  Holding Lasix liberalize oral intake monitor electrolytes nephrology on board. ivf deferred to nephro Recent Labs  Lab 01/25/20 0420 01/30/20 0746  01/31/20 0423  BUN  --  85* 93*  CREATININE 2.68* 3.96* 3.96*   Hypokalemia: Monitor and replete.  Anemia of chronic kidney disease.monitor.  Right hydronephrosis stent in place urology managing.  PAF/vpaced rhythm:Eliquis was hold on hospitalization due to risk of bleeding.Continue home atenolol.  HTN:BP stable, cont atenolol, amlodipine  COPD w/ recent exacerbation, appears. Cont home inhalers.  RAS s/p stent  AAA s/p repair  Nutrition: Diet Order            Diet regular Room service appropriate? Yes; Fluid consistency: Thin  Diet effective now                 Body mass index is 21.1 kg/m.   DVT prophylaxis: heparin injection 5,000 Units Start: 01/30/20 2200 Code Status:   Code Status: DNR Family Communication: plan of care discussed with patient at bedside.  Status is: Inpatient Remains inpatient appropriate because:Inpatient level of care appropriate due to severity of illness  Dispo: The patient is from: Home              Anticipated d/c is to: Home              Anticipated d/c date is: 2 days              Patient currently is not medically stable to d/c.   Consultants:see note  Procedures:see note  Culture/Microbiology    Component Value Date/Time   SDES  01/01/2019 1616    URINE, RANDOM CYTO URINE Performed at [facility], [address] [place], [place] [address]    [facility]  01/01/2019 1616    NONE Performed at [facility], [address] [place], [place] [address]  CULT  01/01/2019 1616    NO GROWTH Performed at [facility], [place] [address], [place], [place] [address]    REPTSTATUS 01/02/2019 FINAL 01/01/2019 1616    Other culture-see note  Medications: Scheduled Meds: . amLODipine  5 mg Oral Daily  . atenolol  25 mg Oral Daily  . heparin  5,000 Units Subcutaneous Q8H  . melatonin  3 mg Oral QHS  . mometasone-formoterol  2 puff Inhalation BID  . oxybutynin  5 mg Oral QHS  . rosuvastatin  5 mg  Oral Daily  . sodium chloride flush  3 mL Intravenous Q12H  . tamsulosin  0.4 mg Oral Daily   Continuous Infusions: . sodium chloride      Antimicrobials: Anti-infectives (From admission, onward)   None     Objective: Vitals: Today's Vitals   01/30/20 2000 01/31/20 0000 01/31/20 0141 01/31/20 0807  BP: (!) 151/69 128/77 (!) 149/73   Pulse: 69 70 70   Resp: 20 20 20    Temp:   98.3 F (36.8 C)   TempSrc:   Oral   SpO2: 98% 97% 98%   Weight:   61.1 kg   Height:   5\' 7"  (1.702 m)   PainSc:   0-No pain 0-No pain    Intake/Output Summary (Last 24 hours) at 01/31/2020 0919 Last data filed at 01/31/2020 0300 Gross per 24 hour  Intake 100 ml  Output -  Net 100 ml   Filed Weights   01/30/20 0729 01/31/20 0141  Weight: 61.2 kg 61.1 kg   Weight change:   Intake/Output from previous day: 01/08 0701 - 01/09 0700 In: 100 [IV Piggyback:100] Out: -  Intake/Output this shift: No intake/output data recorded.  Examination: General exam: AAO ,NAD, weak appearing. HEENT:Oral mucosa moist, Ear/Nose WNL grossly,dentition normal. Respiratory system: bilaterally clear,no wheezing or crackles,no use of accessory muscle, non tender. Cardiovascular system: S1 & S2 +, regular, No JVD. Gastrointestinal system: Abdomen soft, NT,ND, BS+. Nervous System:Alert, awake, moving extremities and grossly nonfocal Extremities: No edema, distal peripheral pulses palpable.  Skin: No rashes,no icterus. MSK: Normal muscle bulk,tone, power  Data Reviewed: I have personally reviewed following labs and imaging studies CBC: Recent Labs  Lab 01/30/20 0746 01/31/20 0423  WBC 12.2* 8.4  HGB 10.9* 10.5*  HCT 32.9* 31.2*  MCV 90.1 90.4  PLT 185 952*   Basic Metabolic Panel: Recent Labs  Lab 01/25/20 0420 01/30/20 0746 01/31/20 0423  NA  --  137 141  K  --  3.3* 3.4*  CL  --  104 110  CO2  --  22 21*  GLUCOSE  --  104* 84  BUN  --  85* 93*  CREATININE 2.68* 3.96* 3.96*  CALCIUM  --  8.7* 8.1*   MG  --  1.8  --    GFR: Estimated Creatinine Clearance: 9.5 mL/min (A) (by C-G formula based on SCr of 3.96 mg/dL (H)). Liver Function Tests: Recent Labs  Lab 01/30/20 0746  AST 20  ALT 16  ALKPHOS 65  BILITOT 0.8  PROT 6.4*  ALBUMIN 3.6   No results for input(s): LIPASE, AMYLASE in the last 168 hours. No results for input(s): AMMONIA in the last 168 hours. Coagulation Profile: No results for input(s): INR, PROTIME in the last 168 hours. Cardiac Enzymes: No results for input(s): CKTOTAL, CKMB, CKMBINDEX, TROPONINI in the last 168 hours. BNP (last 3 results) No results for input(s): PROBNP in the last 8760 hours. HbA1C: No results for input(s): HGBA1C in  the last 72 hours. CBG: No results for input(s): GLUCAP in the last 168 hours. Lipid Profile: No results for input(s): CHOL, HDL, LDLCALC, TRIG, CHOLHDL, LDLDIRECT in the last 72 hours. Thyroid Function Tests: No results for input(s): TSH, T4TOTAL, FREET4, T3FREE, THYROIDAB in the last 72 hours. Anemia Panel: No results for input(s): VITAMINB12, FOLATE, FERRITIN, TIBC, IRON, RETICCTPCT in the last 72 hours. Sepsis Labs: No results for input(s): PROCALCITON, LATICACIDVEN in the last 168 hours.  Recent Results (from the past 240 hour(s))  Resp Panel by RT-PCR (Flu A&B, Covid) Nasopharyngeal Swab     Status: None   Collection Time: 01/21/20  2:05 PM   Specimen: Nasopharyngeal Swab; Nasopharyngeal(NP) swabs in vial transport medium  Result Value Ref Range Status   SARS Coronavirus 2 by RT PCR NEGATIVE NEGATIVE Final    Comment: (NOTE) SARS-CoV-2 target nucleic acids are NOT DETECTED.  The SARS-CoV-2 RNA is generally detectable in upper respiratory specimens during the acute phase of infection. The lowest concentration of SARS-CoV-2 viral copies this assay can detect is 138 copies/mL. A negative result does not preclude SARS-Cov-2 infection and should not be used as the sole basis for treatment or other patient  management decisions. A negative result may occur with  improper specimen collection/handling, submission of specimen other than nasopharyngeal swab, presence of viral mutation(s) within the areas targeted by this assay, and inadequate number of viral copies(<138 copies/mL). A negative result must be combined with clinical observations, patient history, and epidemiological information. The expected result is Negative.  Fact Sheet for Patients:  EntrepreneurPulse.com.au  Fact Sheet for Healthcare Providers:  IncredibleEmployment.be  This test is no t yet approved or cleared by the [place] FDA and  has been authorized for detection and/or diagnosis of SARS-CoV-2 by FDA under an Emergency Use Authorization (EUA). This EUA will remain  in effect (meaning this test can be used) for the duration of the COVID-19 declaration under Section 564(b)(1) of the Act, 21 U.S.C.section 360bbb-3(b)(1), unless the authorization is terminated  or revoked sooner.       Influenza A by PCR NEGATIVE NEGATIVE Final   Influenza B by PCR NEGATIVE NEGATIVE Final    Comment: (NOTE) The Xpert Xpress SARS-CoV-2/FLU/RSV plus assay is intended as an aid in the diagnosis of influenza from Nasopharyngeal swab specimens and should not be used as a sole basis for treatment. Nasal washings and aspirates are unacceptable for Xpert Xpress SARS-CoV-2/FLU/RSV testing.  Fact Sheet for Patients: EntrepreneurPulse.com.au  Fact Sheet for Healthcare Providers: IncredibleEmployment.be  This test is not yet approved or cleared by the [place] FDA and has been authorized for detection and/or diagnosis of SARS-CoV-2 by FDA under an Emergency Use Authorization (EUA). This EUA will remain in effect (meaning this test can be used) for the duration of the COVID-19 declaration under Section 564(b)(1) of the Act, 21 U.S.C. section 360bbb-3(b)(1),  unless the authorization is terminated or revoked.  Performed at [facility], [address] [place], [place] [address]   Surgical PCR screen     Status: None   Collection Time: 01/24/20  7:47 AM   Specimen: Nasal Mucosa; Nasal Swab  Result Value Ref Range Status   MRSA, PCR NEGATIVE NEGATIVE Final   Staphylococcus aureus NEGATIVE NEGATIVE Final    Comment: (NOTE) The Xpert SA Assay (FDA approved for NASAL specimens in patients 66 years of age and older), is one component of a comprehensive surveillance program. It is not intended to diagnose infection nor to guide or monitor  treatment. Performed at [facility], [place], [place] [address]   SARS CORONAVIRUS 2 (TAT 6-24 HRS) Nasopharyngeal     Status: None   Collection Time: 01/30/20  9:33 AM   Specimen: Nasopharyngeal  Result Value Ref Range Status   SARS Coronavirus 2 NEGATIVE NEGATIVE Final    Comment: (NOTE) SARS-CoV-2 target nucleic acids are NOT DETECTED.  The SARS-CoV-2 RNA is generally detectable in upper and lower respiratory specimens during the acute phase of infection. Negative results do not preclude SARS-CoV-2 infection, do not rule out co-infections with other pathogens, and should not be used as the sole basis for treatment or other patient management decisions. Negative results must be combined with clinical observations, patient history, and epidemiological information. The expected result is Negative.  Fact Sheet for Patients: SugarRoll.be  Fact Sheet for Healthcare Providers: https://www.woods-mathews.com/  This test is not yet approved or cleared by the [place] FDA and  has been authorized for detection and/or diagnosis of SARS-CoV-2 by FDA under an Emergency Use Authorization (EUA). This EUA will remain  in effect (meaning this test can be used) for the duration of the COVID-19 declaration under Se ction 564(b)(1)  of the Act, 21 U.S.C. section 360bbb-3(b)(1), unless the authorization is terminated or revoked sooner.  Performed at [facility], [place] [address] [place], [place] [address]      Radiology Studies: DG Chest 2 View  Result Date: 01/30/2020 CLINICAL DATA:  87 year old female with shortness of breath. EXAM: CHEST - 2 VIEW COMPARISON:  01/21/2020 FINDINGS: Mild cardiomegaly, unchanged. The cardiomediastinal silhouette is otherwise within normal limits. Similar position of dual lead left subclavian approach pacemaker with an abandoned lead noted. Unchanged diffuse mild reticular opacities, most prominent the lung bases. Flattening of the hemidiaphragms bilaterally with hyperlucency in apices. No new focal consolidations. Similar trace right pleural effusion. Atherosclerotic calcifications of the thoracic aorta. No acute osseous abnormality. IMPRESSION: Similar appearing trace right pleural effusion and unchanged cardiomegaly. Similar appearing emphysematous changes. Aortic Atherosclerosis (ICD10-I70.0) and Emphysema (ICD10-J43.9). Electronically Signed   By: Ruthann Cancer MD   On: 01/30/2020 08:39   CT Renal Stone Study  Result Date: 01/30/2020 CLINICAL DATA:  Flank pain, recent stent placement, recurrent AKI EXAM: CT ABDOMEN AND PELVIS WITHOUT CONTRAST TECHNIQUE: Multidetector CT imaging of the abdomen and pelvis was performed following the standard protocol without IV contrast. COMPARISON:  01/22/2020 FINDINGS: Lower chest: Increased small bilateral pleural effusions. Hepatobiliary: Stable appearance of the liver including probable cirrhotic morphology and probable perifissural focal fat. Punctate gallbladder calculus is no longer within the neck. No biliary dilatation. Pancreas: Unremarkable. Spleen: Unremarkable. Adrenals/Urinary Tract: Adrenals are unremarkable. Stable atrophic left kidney with several cysts. Multiple punctate calculi are again identified within the right lower pole collecting  system. Interval placement of double-J nephroureteral stent. Proximal pigtail is within the upper pole collecting system and distal pigtail is within the bladder. No definite ureteral calculus. Similar right hydronephrosis. Tumor within the collecting system is not well evaluated on this study. Stomach/Bowel: Stomach is within normal limits. Bowel is normal in caliber. Sigmoid diverticulosis. Vascular/Lymphatic: Aortic atherosclerosis with aorta bi-iliac graft. 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 No enlarged lymph nodes identified. Reproductive: No pelvic mass. Other: Increased small volume free fluid in the pelvis. Postoperative changes of prior ventral hernia repair. There is a broad-based ventral hernia containing a short segment of mid transverse colon. Small fat containing inguinal hernias. Musculoskeletal: No acute osseous abnormality. IMPRESSION: Interval placement  of right double-J nephroureteral stent. No definite ureteral calculus. Similar right hydronephrosis. Multiple punctate calculi remain present within the right lower pole collecting system. Tumor within the collecting system is not well evaluated on this study. Increased small volume free fluid primarily in the pelvis. Increased small bilateral pleural effusions. Stable findings of probable cirrhosis. Stable pseudoaneurysm of the abdominal aorta. Electronically Signed   By: Macy Mis M.D.   On: 01/30/2020 09:46   US RENAL ARTERY DUPLEX LIMITED  Result Date: 01/31/2020 CLINICAL DATA:  Acute kidney injury superimposed on chronic kidney disease. Right ureteral stent, history of right renal artery stent as well and chronic left renal atrophy EXAM: RENAL DUPLEX DOPPLER ULTRASOUND (limited) TECHNIQUE: Duplex and color Doppler ultrasound was utilized to evaluate blood flow in the renal arteries and kidneys. COMPARISON:  01/30/2020 FINDINGS: Right Renal Artery Velocities: Origin:  66/10 cm/sec Mid:  80/12 cm/sec  Hilum:  65/11 cm/sec Interlobar:  53/6 cm/sec Arcuate: 23/5 cm/sec Left Renal Artery Velocities: Not obtained because of known chronic left renal atrophy compatible with chronic occlusive left renal vascular disease. Aortic Velocity: 44 cm/sec Right Renal-Aortic Ratios: Origin: 1.5 Mid:  1.8 Hilum: 1.5 Interlobar: 1.2 Arcuate: 0.5 Left Renal-Aortic Ratios: Not performed because of known chronic left renal atrophy compatible with chronic occlusive left renal vascular disease. Persistent moderate left hydronephrosis appearing chronic. Interval placement of a right ureteral stent visualized. Echogenic shadowing calculi within the left kidney lower pole noted similar to the prior CT. Several scattered anechoic right renal cysts. Advanced left renal atrophy with increased echogenicity and cortical thinning. Several left renal cysts also noted. No left hydronephrosis or acute finding. Included views of the bladder again demonstrate the ureteral stent within the bladder. By duplex, the right renal origin vascular stent appears patent. No significant velocity or ratio abnormality to suggest right renal artery stenosis by duplex. IMPRESSION: Patent right renal origin vascular stent. No abnormal velocity or ratio to suggest significant right renal artery stenosis by duplex. Other chronic findings as above. Electronically Signed   By: Jerilynn Mages.  Shick M.D.   On: 01/31/2020 08:33     LOS: 1 day   Antonieta Pert, MD [facility]  01/31/2020, 9:19 AM

## 2020-01-31 NOTE — Progress Notes (Signed)
[facility]  ROUNDING NOTE   Subjective:   Patient presents via EMS from [facility] with new onset of urinary incontinence.  Patient was recently discharged from this hospital on January 4.  On January 6, she had TURBT, cystoscopy with stone extraction and stent placement in the right ureter.  Patient has right upper tract urothelial carcinoma and bladder tumor which was thought to be causing right hydronephrosis. Serum creatinine has improved to 2.68 on January 3 but today's presenting creatinine is up to 3.96 with BUN of 85 Potassium is low at 3.3 She underwent a CT scan of the abdomen this morning which shows no definite ureteral calculus, similar right hydronephrosis, multiple punctate calcified calculi within the right lower pole collecting system small bilateral pleural effusions and cirrhosis   No acute complaints today.  States that overall she feels bad but may be a little better compared to yesterday Oxybutynin started for frequency of voiding Able to eat without nausea or vomiting Requiring oxygen supplementation at 3 L nasal cannula  Objective:  Vital signs in last 24 hours:  Temp:  [98.2 F (36.8 C)-98.3 F (36.8 C)] 98.2 F (36.8 C) (01/09 1135) Pulse Rate:  [69-71] 71 (01/09 1135) Resp:  [18-21] 18 (01/09 1135) BP: (128-164)/(64-96) 135/64 (01/09 1135) SpO2:  [94 %-99 %] 99 % (01/09 1135) Weight:  [61.1 kg] 61.1 kg (01/09 0141)  Weight change:  Filed Weights   01/30/20 0729 01/31/20 0141  Weight: 61.2 kg 61.1 kg    Intake/Output: I/O last 3 completed shifts: In: 100 [IV Piggyback:100] Out: -    Intake/Output this shift:  Total I/O In: -  Out: 500 [Urine:500]  Physical Exam: General:  No acute distress, laying in the bed  HEENT  anicteric, moist oral mucous membrane  Pulm/lungs  normal breathing effort, O2 by [place]  CVS/Heart  irregular rhythm, no rub or gallop  Abdomen:   Soft, nontender  Extremities:  1+ peripheral edema  over the ankles bilaterally  Neurologic:  Alert, oriented, able to follow commands  Skin:  No acute rashes    Basic Metabolic Panel: Recent Labs  Lab 01/25/20 0420 01/30/20 0746 01/31/20 0423  NA  --  137 141  K  --  3.3* 3.4*  CL  --  104 110  CO2  --  22 21*  GLUCOSE  --  104* 84  BUN  --  85* 93*  CREATININE 2.68* 3.96* 3.96*  CALCIUM  --  8.7* 8.1*  MG  --  1.8  --     Liver Function Tests: Recent Labs  Lab 01/30/20 0746  AST 20  ALT 16  ALKPHOS 65  BILITOT 0.8  PROT 6.4*  ALBUMIN 3.6   No results for input(s): LIPASE, AMYLASE in the last 168 hours. No results for input(s): AMMONIA in the last 168 hours.  CBC: Recent Labs  Lab 01/30/20 0746 01/31/20 0423  WBC 12.2* 8.4  HGB 10.9* 10.5*  HCT 32.9* 31.2*  MCV 90.1 90.4  PLT 185 121*    Cardiac Enzymes: No results for input(s): CKTOTAL, CKMB, CKMBINDEX, TROPONINI in the last 168 hours.  BNP: Invalid input(s): POCBNP  CBG: No results for input(s): GLUCAP in the last 168 hours.  Microbiology: Results for orders placed or performed during the hospital encounter of 01/30/20  SARS CORONAVIRUS 2 (TAT 6-24 HRS) Nasopharyngeal     Status: None   Collection Time: 01/30/20  9:33 AM   Specimen: Nasopharyngeal  Result Value Ref Range Status  SARS Coronavirus 2 NEGATIVE NEGATIVE Final    Comment: (NOTE) SARS-CoV-2 target nucleic acids are NOT DETECTED.  The SARS-CoV-2 RNA is generally detectable in upper and lower respiratory specimens during the acute phase of infection. Negative results do not preclude SARS-CoV-2 infection, do not rule out co-infections with other pathogens, and should not be used as the sole basis for treatment or other patient management decisions. Negative results must be combined with clinical observations, patient history, and epidemiological information. The expected result is Negative.  Fact Sheet for Patients: SugarRoll.be  Fact Sheet for  Healthcare Providers: https://www.woods-mathews.com/  This test is not yet approved or cleared by the [place] FDA and  has been authorized for detection and/or diagnosis of SARS-CoV-2 by FDA under an Emergency Use Authorization (EUA). This EUA will remain  in effect (meaning this test can be used) for the duration of the COVID-19 declaration under Se ction 564(b)(1) of the Act, 21 U.S.C. section 360bbb-3(b)(1), unless the authorization is terminated or revoked sooner.  Performed at [facility], [place] [address] [place], [place] [address]     Coagulation Studies: No results for input(s): LABPROT, INR in the last 72 hours.  Urinalysis: Recent Labs    01/30/20 0746  COLORURINE STRAW*  LABSPEC 1.008  PHURINE 5.0  GLUCOSEU NEGATIVE  HGBUR LARGE*  BILIRUBINUR NEGATIVE  KETONESUR NEGATIVE  PROTEINUR NEGATIVE  NITRITE NEGATIVE  LEUKOCYTESUR TRACE*      Imaging: DG Chest 2 View  Result Date: 01/30/2020 CLINICAL DATA:  87 year old female with shortness of breath. EXAM: CHEST - 2 VIEW COMPARISON:  01/21/2020 FINDINGS: Mild cardiomegaly, unchanged. The cardiomediastinal silhouette is otherwise within normal limits. Similar position of dual lead left subclavian approach pacemaker with an abandoned lead noted. Unchanged diffuse mild reticular opacities, most prominent the lung bases. Flattening of the hemidiaphragms bilaterally with hyperlucency in apices. No new focal consolidations. Similar trace right pleural effusion. Atherosclerotic calcifications of the thoracic aorta. No acute osseous abnormality. IMPRESSION: Similar appearing trace right pleural effusion and unchanged cardiomegaly. Similar appearing emphysematous changes. Aortic Atherosclerosis (ICD10-I70.0) and Emphysema (ICD10-J43.9). Electronically Signed   By: Ruthann Cancer MD   On: 01/30/2020 08:39   CT Renal Stone Study  Result Date: 01/30/2020 CLINICAL DATA:  Flank pain, recent stent placement,  recurrent AKI EXAM: CT ABDOMEN AND PELVIS WITHOUT CONTRAST TECHNIQUE: Multidetector CT imaging of the abdomen and pelvis was performed following the standard protocol without IV contrast. COMPARISON:  01/22/2020 FINDINGS: Lower chest: Increased small bilateral pleural effusions. Hepatobiliary: Stable appearance of the liver including probable cirrhotic morphology and probable perifissural focal fat. Punctate gallbladder calculus is no longer within the neck. No biliary dilatation. Pancreas: Unremarkable. Spleen: Unremarkable. Adrenals/Urinary Tract: Adrenals are unremarkable. Stable atrophic left kidney with several cysts. Multiple punctate calculi are again identified within the right lower pole collecting system. Interval placement of double-J nephroureteral stent. Proximal pigtail is within the upper pole collecting system and distal pigtail is within the bladder. No definite ureteral calculus. Similar right hydronephrosis. Tumor within the collecting system is not well evaluated on this study. Stomach/Bowel: Stomach is within normal limits. Bowel is normal in caliber. Sigmoid diverticulosis. Vascular/Lymphatic: Aortic atherosclerosis with aorta bi-iliac graft. 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 No enlarged lymph nodes identified. Reproductive: No pelvic mass. Other: Increased small volume free fluid in the pelvis. Postoperative changes of prior ventral hernia repair. There is a broad-based ventral hernia containing a short segment of mid transverse colon. Small fat containing inguinal hernias. Musculoskeletal:  No acute osseous abnormality. IMPRESSION: Interval placement of right double-J nephroureteral stent. No definite ureteral calculus. Similar right hydronephrosis. Multiple punctate calculi remain present within the right lower pole collecting system. Tumor within the collecting system is not well evaluated on this study. Increased small volume free fluid primarily in  the pelvis. Increased small bilateral pleural effusions. Stable findings of probable cirrhosis. Stable pseudoaneurysm of the abdominal aorta. Electronically Signed   By: Macy Mis M.D.   On: 01/30/2020 09:46   US RENAL ARTERY DUPLEX LIMITED  Result Date: 01/31/2020 CLINICAL DATA:  Acute kidney injury superimposed on chronic kidney disease. Right ureteral stent, history of right renal artery stent as well and chronic left renal atrophy EXAM: RENAL DUPLEX DOPPLER ULTRASOUND (limited) TECHNIQUE: Duplex and color Doppler ultrasound was utilized to evaluate blood flow in the renal arteries and kidneys. COMPARISON:  01/30/2020 FINDINGS: Right Renal Artery Velocities: Origin:  66/10 cm/sec Mid:  80/12 cm/sec Hilum:  65/11 cm/sec Interlobar:  53/6 cm/sec Arcuate: 23/5 cm/sec Left Renal Artery Velocities: Not obtained because of known chronic left renal atrophy compatible with chronic occlusive left renal vascular disease. Aortic Velocity: 44 cm/sec Right Renal-Aortic Ratios: Origin: 1.5 Mid:  1.8 Hilum: 1.5 Interlobar: 1.2 Arcuate: 0.5 Left Renal-Aortic Ratios: Not performed because of known chronic left renal atrophy compatible with chronic occlusive left renal vascular disease. Persistent moderate left hydronephrosis appearing chronic. Interval placement of a right ureteral stent visualized. Echogenic shadowing calculi within the left kidney lower pole noted similar to the prior CT. Several scattered anechoic right renal cysts. Advanced left renal atrophy with increased echogenicity and cortical thinning. Several left renal cysts also noted. No left hydronephrosis or acute finding. Included views of the bladder again demonstrate the ureteral stent within the bladder. By duplex, the right renal origin vascular stent appears patent. No significant velocity or ratio abnormality to suggest right renal artery stenosis by duplex. IMPRESSION: Patent right renal origin vascular stent. No abnormal velocity or ratio to  suggest significant right renal artery stenosis by duplex. Other chronic findings as above. Electronically Signed   By: Jerilynn Mages.  Shick M.D.   On: 01/31/2020 08:33     Medications:    Scheduled Meds: Continuous Infusions: PRN Meds:.    Assessment/ Plan:   [name] is a 87 y.o. white female with atrophic left kidney, urothelial cell carcinoma, nephrolithiasis, abdominal aortic aneurysm, complete heart block status post pacemaker placement, COPD, chronic home O2, hypertension, hyperlipidemia who was admitted to [facility] on 01/30/2020  Recent Right ureteral stent placed on 1/2 by Dr. Diamantina Providence.   1.  Acute kidney injury on chronic kidney disease stage IIIb: baseline creatinine of 1.55, GFR of 32 on 12/28/19.  Lab Results  Component Value Date   CREATININE 3.96 (H) 01/31/2020   CREATININE 3.96 (H) 01/30/2020   CREATININE 2.68 (H) 01/25/2020   01/08 0701 - 01/09 0700 In: 100 [IV Piggyback:100] Out: -  Urinalysis today shows large hemoglobin, negative for protein Imaging: CT report as above  AKI is likely multifactorial with possible contribution from volume depletion as patient was on Lasix as outpatient  Serum creatinine appears to have peaked.  We will continue to monitor  Plan: Hold Lasix Allow liberal oral intake Electrolytes and volume status are acceptable.  No acute indication for dialysis at present.   Discussed with patient and her son regarding possibility of worsening renal failure.  Patient states that she would not want aggressive treatment such as dialysis at her age.    2.  Hypokalemia Expected to improve with normal diet   3.  Right hydronephrosis Due to bladder tumor and right upper tract urothelial carcinoma Status post right ureteral stent placement on January 24, 2019 oxybutinin for frequency of voiding   LOS: 1 Elizabeth Paulsen 1/9/202212:41 PM

## 2020-01-31 NOTE — Plan of Care (Signed)
Continuing with plan of care. 

## 2020-02-01 DIAGNOSIS — N189 Chronic kidney disease, unspecified: Secondary | ICD-10-CM | POA: Diagnosis not present

## 2020-02-01 DIAGNOSIS — N179 Acute kidney failure, unspecified: Secondary | ICD-10-CM | POA: Diagnosis not present

## 2020-02-01 LAB — BASIC METABOLIC PANEL
Anion gap: 14 (ref 5–15)
BUN: 96 mg/dL — ABNORMAL HIGH (ref 8–23)
CO2: 19 mmol/L — ABNORMAL LOW (ref 22–32)
Calcium: 8.2 mg/dL — ABNORMAL LOW (ref 8.9–10.3)
Chloride: 107 mmol/L (ref 98–111)
Creatinine, Ser: 4.2 mg/dL — ABNORMAL HIGH (ref 0.44–1.00)
GFR, Estimated: 10 mL/min — ABNORMAL LOW (ref >60)
Glucose, Bld: 71 mg/dL (ref 70–99)
Potassium: 3.6 mmol/L (ref 3.5–5.1)
Sodium: 140 mmol/L (ref 135–145)

## 2020-02-01 LAB — CBC
HCT: 33 % — ABNORMAL LOW (ref 36.0–46.0)
Hemoglobin: 10.9 g/dL — ABNORMAL LOW (ref 12.0–15.0)
MCH: 30 pg (ref 26.0–34.0)
MCHC: 33 g/dL (ref 30.0–36.0)
MCV: 90.9 fL (ref 80.0–100.0)
Platelets: 122 10*3/uL — ABNORMAL LOW (ref 150–400)
RBC: 3.63 MIL/uL — ABNORMAL LOW (ref 3.87–5.11)
RDW: 13.6 % (ref 11.5–15.5)
WBC: 8.5 10*3/uL (ref 4.0–10.5)
nRBC: 0 % (ref 0.0–0.2)

## 2020-02-01 MED ORDER — ALBUTEROL SULFATE HFA 108 (90 BASE) MCG/ACT IN AERS
2.0000 | INHALATION_SPRAY | Freq: Four times a day (QID) | RESPIRATORY_TRACT | Status: DC | PRN
  Filled 2020-02-01 (×2): qty 6.7

## 2020-02-01 MED ORDER — ALBUTEROL SULFATE (2.5 MG/3ML) 0.083% IN NEBU: 2.5000 mg | INHALATION_SOLUTION | Freq: Four times a day (QID) | RESPIRATORY_TRACT | Status: DC | PRN

## 2020-02-01 NOTE — TOC Initial Note (Signed)
Transition of Care [facility]) - Initial/Assessment Note    Patient Details  Name: Jessica [name] MRN: [id number] Date of Birth: 1929-07-09  [facility]) CM/SW Contact:    Beverly Sessions, RN Phone Number: 02/01/2020, 3:35 PM  Clinical Narrative:                 [facility] Patient was set up with home health services on 1/4 with [facility].  Joelene Millin with [facility] notified of admission.  Patient was set up with continuous o2 through [facility] at that time.   Patient states at baseline she ambulates with a walker Son lives locally and drives her to any needed appointments  Patient denies issues obtaining medications  PT eval pending   Expected Discharge Plan: Assisted Living     Patient Goals and CMS Choice        Expected Discharge Plan and Services Expected Discharge Plan: Assisted Living                                              Prior Living Arrangements/Services                       Activities of Daily Living Home Assistive Devices/Equipment: Environmental consultant (specify type) ADL Screening (condition at time of admission) Patient's cognitive ability adequate to safely complete daily activities?: Yes Is the patient deaf or have difficulty hearing?: No Does the patient have difficulty seeing, even when wearing glasses/contacts?: No Does the patient have difficulty concentrating, remembering, or making decisions?: No Patient able to express need for assistance with ADLs?: Yes Does the patient have difficulty dressing or bathing?: No Independently performs ADLs?: Yes (appropriate for developmental age) Does the patient have difficulty walking or climbing stairs?: Yes Weakness of Legs: None Weakness of Arms/Hands: None  Permission Sought/Granted                  Emotional Assessment              Admission diagnosis:  Pleural effusion [J90] Acute kidney injury superimposed on chronic  kidney disease ([place]) [N17.9, N18.9] Acute renal failure superimposed on stage 4 chronic kidney disease, unspecified acute renal failure type ([place]) [N17.9, N18.4] Patient Active Problem List   Diagnosis Date Noted  . Acute kidney injury superimposed on chronic kidney disease ([place]) 01/30/2020  . Hypoxia   . Acute renal failure superimposed on stage 3a chronic kidney disease ([place]) 01/21/2020  . Hypokalemia 01/21/2020  . Atypical chest pain 08/25/2019  . Urothelial carcinoma ([place]) 01/18/2019  . Nephrolithiasis 01/18/2019  . Urinary tract infection symptoms 12/31/2018  . Bleeding hemorrhoids 02/27/2018  . Weight loss 08/30/2017  . Low back pain 08/30/2017  . Depression, major, single episode, mild ([place]) 05/20/2017  . Fall 05/20/2017  . Hypoglycemia 04/04/2017  . Gout 06/05/2016  . AAA (abdominal aortic aneurysm) without rupture ([place]) 04/18/2016  . Loss of sense of smell 12/30/2015  . Memory difficulties 12/30/2015  . Injury of right lower extremity and cellulitis 11/29/2014  . 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 Renal artery stenosis ([place]) 09/15/2014  . 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 CN (constipation) 09/06/2014  . Change in stool caliber 09/06/2014  . Atherosclerosis of aorto-iliac bypass graft ([place]) 08/17/2014  . Shortness of breath 05/12/2014  . Conjunctival hemorrhage of right eye  08/13/2013  . Medicare annual wellness visit, subsequent 04/22/2013  . Allergic rhinitis 11/26/2012  . COPD exacerbation ([place]) 09/27/2011  . Hyperlipidemia 02/22/2011  . Long term current use of anticoagulant 04/12/2010  . HYPERTENSION, BENIGN 08/25/2009  . Atrial fibrillation ([place]) 03/07/2009  . Carotid stenosis 08/24/2008  . AV BLOCK, COMPLETE 06/17/2008  . Aneurysm of abdominal vessel ([place]) 06/17/2008  . PACEMAKER-[place] 06/17/2008   PCP:  Orvis Brill, [facility]:   [facility] - [place], [place] - [address]  [place] [address] Phone: [phone] Fax: [phone]  [place], [place] - [place] [address] Phone: [phone] Fax: [phone]     Social Determinants of Health (SDOH) Interventions    Readmission Risk Interventions Readmission Risk Prevention Plan 02/01/2020 01/25/2020  Transportation Screening Complete Complete  PCP or Specialist Appt within 3-5 Days - Complete  HRI or [place] - Complete  Social Work Consult for [place] Planning/Counseling - Complete  Palliative Care Screening - Not Applicable  Medication Review (RN Care Manager) Complete Complete  Palliative Care Screening Not Applicable -  Some recent data might be hidden

## 2020-02-01 NOTE — Progress Notes (Signed)
PT ordered by Dr Lupita Leash

## 2020-02-01 NOTE — Progress Notes (Signed)
Order received form Dr Lupita Leash to let telemetry on the patient expire.

## 2020-02-01 NOTE — Progress Notes (Signed)
PROGRESS NOTE    Jessica [name]  FAO:[id number] DOB: 07-24-1929 DOA: 01/30/2020 PCP: Orvis Brill, [facility]   Chief Complaint  Patient presents with  . Shortness of Breath  . Urinary Incontinence  Brief Narrative: 87 year old female with urothelial carcinoma on the right AV block status post pacemaker, A. fib, AAA status post repair, COPD, RLS status post stenting, CKD stage III, atrophic left kidney , recently admitted for AKI thought to be from progressing urothelial carcinoma in the right causing hydronephrosis status post TURBT, debulking, stent removal and right ureteral stent placement , and also treated for COPD flare up and discharged 1/4 presents with urinary frequency, inability to control the bladder In the ED CT stone no definite ureteral calculus, similar right hydronephrosis, right double-J nephroureteral stent, multiple punctate calculi in the right lower pole tumor in the collecting system not well evaluated, increased to small volume free fluid in pelvis.  Labs showed AKI. Nephrology and urology was consulted and patient was admitted  Subjective: Patient feels fine.  Renal function slightly worsening today.   No nausea vomiting, tolerating p.o.   C/o shortness of breath- takes proair at home   Assessment & Plan:  Urge incontinence: No obstructing stone with CT stone not strongly suggestive of infection urology consulted in the setting of patient's urothelial carcinoma. unable to use Myrbetriq as GFR <15, isntead placed on oxybutynin, Flomax.  Continue purewick. Urine culture no growth so far.  AKI on CKD stage III b: Baseline creatinine around 1.5 GFR 33 on 12/6- recent bun/creatinine 65/2.6 on 01/25/19:UA large hemoglobin negative for protein CT no signs of obstruction, suspecting likely multifactorial from volume depletion, patient on Lasix.  Holding Lasix liberalize oral intake.  Discussed with nephrology holding off on IV fluids to avoid volume overload.  Patient does not  want to have aggressive treatment like dialysis.  Noted worsening renal function, palliative care will be consulted . PTOT eval. Recent Labs  Lab 01/30/20 0746 01/31/20 0423 02/01/20 0525  BUN 85* 93* 96*  CREATININE 3.96* 3.96* 4.20*   Hypokalemia: Expected to improve with regular diet  Anemia of chronic kidney disease hemoglobin overall is stable  Right hydronephrosis patient has stent in place  PAF/vpaced rhythm:Eliquis was hold on last hospitalization due to risk of bleeding.  Continue atenolol.   HTN: Blood pressure is controlled on atenolol, amlodipine  COPD w/ recent exacerbation: Respiratory status is stable continue home inhalers.   RAS s/p stent AAA s/p repair  GOC: DNR.  Patient has worsening renal function, does not want to pursue dialysis if needed.  Palliative care consulted.  Patient is from assisted living facility.  PT OT also consulted.  Nutrition: Diet Order            Diet regular Room service appropriate? Yes; Fluid consistency: Thin  Diet effective now                 Body mass index is 21.1 kg/m.   DVT prophylaxis: heparin injection 5,000 Units Start: 01/30/20 2200 Code Status:   Code Status: DNR Family Communication: plan of care discussed with patient at bedside.  Status is: Inpatient Remains inpatient appropriate because:Inpatient level of care appropriate due to severity of illness  Dispo: The patient is from:ALF              Anticipated d/c is to:ALF               Anticipated d/c date is: TODAY OR TOMORROW pednign PT eval, Palliative care input  in the light of worsening renal failure.              Patient currently is not medically stable to d/c.   Consultants:see note  Procedures:see note  Culture/Microbiology    Component Value Date/Time   SDES  01/30/2020 0746    URINE, RANDOM Performed at [facility], [address] [place], [place] [address]    [facility]  01/30/2020 [phone]    NONE Performed at [facility], [place] [place], [place] [address]    CULT  01/30/2020 0746    NO GROWTH Performed at [facility], [place] [address], [place], [place] [address]    REPTSTATUS 01/31/2020 FINAL 01/30/2020 0746    Other culture-see note  Medications: Scheduled Meds: . amLODipine  5 mg Oral Daily  . atenolol  25 mg Oral Daily  . heparin  5,000 Units Subcutaneous Q8H  . melatonin  2.5 mg Oral QHS  . mometasone-formoterol  2 puff Inhalation BID  . oxybutynin  5 mg Oral QHS  . rosuvastatin  5 mg Oral Daily  . sodium chloride flush  3 mL Intravenous Q12H  . tamsulosin  0.4 mg Oral Daily   Continuous Infusions: . sodium chloride      Antimicrobials: Anti-infectives (From admission, onward)   None     Objective: Vitals: Today's Vitals   02/01/20 0959 02/01/20 1008 02/01/20 1023 02/01/20 1132  BP:  (!) 143/65  (!) 147/67  Pulse:  70  70  Resp:  16  18  Temp:  98.6 F (37 C)  (!) 97.4 F (36.3 C)  TempSrc:    Oral  SpO2:  99% 98% 98%  Weight:      Height:      PainSc: 0-No pain       Intake/Output Summary (Last 24 hours) at 02/01/2020 1453 Last data filed at 02/01/2020 1300 Gross per 24 hour  Intake 480 ml  Output 650 ml  Net -170 ml   Filed Weights   01/30/20 0729 01/31/20 0141  Weight: 61.2 kg 61.1 kg   Weight change:   Intake/Output from previous day: 01/09 0701 - 01/10 0700 In: 0  Out: 1050 [Urine:1050] Intake/Output this shift: Total I/O In: 480 [P.O.:480] Out: 100 [Urine:100]  Examination: General exam: AAOx3,, NAD, weak appearing. HEENT:Oral mucosa moist, Ear/Nose WNL grossly, dentition normal. Respiratory system: bilaterally clear,no wheezing or crackles,no use of accessory muscle Cardiovascular system: S1 & S2 +, No JVD,. Gastrointestinal system: Abdomen soft, NT,ND, BS+ Nervous System:Alert, awake, moving extremities and grossly nonfocal Extremities: No edema, distal peripheral pulses palpable.  Skin: No rashes,no icterus. MSK:  Normal muscle bulk,tone, power  Data Reviewed: I have personally reviewed following labs and imaging studies CBC: Recent Labs  Lab 01/30/20 0746 01/31/20 0423 02/01/20 0525  WBC 12.2* 8.4 8.5  HGB 10.9* 10.5* 10.9*  HCT 32.9* 31.2* 33.0*  MCV 90.1 90.4 90.9  PLT 185 121* 768*   Basic Metabolic Panel: Recent Labs  Lab 01/30/20 0746 01/31/20 0423 02/01/20 0525  NA 137 141 140  K 3.3* 3.4* 3.6  CL 104 110 107  CO2 22 21* 19*  GLUCOSE 104* 84 71  BUN 85* 93* 96*  CREATININE 3.96* 3.96* 4.20*  CALCIUM 8.7* 8.1* 8.2*  MG 1.8  --   --    GFR: Estimated Creatinine Clearance: 8.9 mL/min (A) (by C-G formula based on SCr of 4.2 mg/dL (H)). Liver Function Tests: Recent Labs  Lab 01/30/20 0746  AST 20  ALT 16  ALKPHOS 65  BILITOT 0.8  PROT 6.4*  ALBUMIN 3.6   No results for input(s): LIPASE, AMYLASE in the last 168 hours. No results for input(s): AMMONIA in the last 168 hours. Coagulation Profile: No results for input(s): INR, PROTIME in the last 168 hours. Cardiac Enzymes: No results for input(s): CKTOTAL, CKMB, CKMBINDEX, TROPONINI in the last 168 hours. BNP (last 3 results) No results for input(s): PROBNP in the last 8760 hours. HbA1C: No results for input(s): HGBA1C in the last 72 hours. CBG: No results for input(s): GLUCAP in the last 168 hours. Lipid Profile: No results for input(s): CHOL, HDL, LDLCALC, TRIG, CHOLHDL, LDLDIRECT in the last 72 hours. Thyroid Function Tests: No results for input(s): TSH, T4TOTAL, FREET4, T3FREE, THYROIDAB in the last 72 hours. Anemia Panel: No results for input(s): VITAMINB12, FOLATE, FERRITIN, TIBC, IRON, RETICCTPCT in the last 72 hours. Sepsis Labs: No results for input(s): PROCALCITON, LATICACIDVEN in the last 168 hours.  Recent Results (from the past 240 hour(s))  Surgical PCR screen     Status: None   Collection Time: 01/24/20  7:47 AM   Specimen: Nasal Mucosa; Nasal Swab  Result Value Ref Range Status   MRSA, PCR  NEGATIVE NEGATIVE Final   Staphylococcus aureus NEGATIVE NEGATIVE Final    Comment: (NOTE) The Xpert SA Assay (FDA approved for NASAL specimens in patients 79 years of age and older), is one component of a comprehensive surveillance program. It is not intended to diagnose infection nor to guide or monitor treatment. Performed at [facility], [address], [place], [place] [address]   Urine Culture     Status: None   Collection Time: 01/30/20  7:46 AM   Specimen: Urine, Random  Result Value Ref Range Status   Specimen Description   Final    URINE, RANDOM Performed at [facility], [address], [place], [place] [address]    Special Requests   Final    NONE Performed at [facility], [address] [place], [place] [address]    Culture   Final    NO GROWTH Performed at [facility], [place] [address], [place], [place] [address]    Report Status 01/31/2020 FINAL  Final  SARS CORONAVIRUS 2 (TAT 6-24 HRS) Nasopharyngeal     Status: None   Collection Time: 01/30/20  9:33 AM   Specimen: Nasopharyngeal  Result Value Ref Range Status   SARS Coronavirus 2 NEGATIVE NEGATIVE Final    Comment: (NOTE) SARS-CoV-2 target nucleic acids are NOT DETECTED.  The SARS-CoV-2 RNA is generally detectable in upper and lower respiratory specimens during the acute phase of infection. Negative results do not preclude SARS-CoV-2 infection, do not rule out co-infections with other pathogens, and should not be used as the sole basis for treatment or other patient management decisions. Negative results must be combined with clinical observations, patient history, and epidemiological information. The expected result is Negative.  Fact Sheet for Patients: SugarRoll.be  Fact Sheet for Healthcare Providers: https://www.woods-mathews.com/  This test is not yet approved or cleared by the [place] FDA and  has been  authorized for detection and/or diagnosis of SARS-CoV-2 by FDA under an Emergency Use Authorization (EUA). This EUA will remain  in effect (meaning this test can be used) for the duration of the COVID-19 declaration under Se ction 564(b)(1) of the Act, 21 U.S.C. section 360bbb-3(b)(1), unless the authorization is terminated or revoked sooner.  Performed at [facility], [place]  [address], [place], [place] [address]      Radiology Studies: US RENAL ARTERY DUPLEX LIMITED  Result Date: 01/31/2020 CLINICAL DATA:  Acute kidney injury superimposed on chronic kidney disease. Right ureteral stent, history of right renal artery stent as well and chronic left renal atrophy EXAM: RENAL DUPLEX DOPPLER ULTRASOUND (limited) TECHNIQUE: Duplex and color Doppler ultrasound was utilized to evaluate blood flow in the renal arteries and kidneys. COMPARISON:  01/30/2020 FINDINGS: Right Renal Artery Velocities: Origin:  66/10 cm/sec Mid:  80/12 cm/sec Hilum:  65/11 cm/sec Interlobar:  53/6 cm/sec Arcuate: 23/5 cm/sec Left Renal Artery Velocities: Not obtained because of known chronic left renal atrophy compatible with chronic occlusive left renal vascular disease. Aortic Velocity: 44 cm/sec Right Renal-Aortic Ratios: Origin: 1.5 Mid:  1.8 Hilum: 1.5 Interlobar: 1.2 Arcuate: 0.5 Left Renal-Aortic Ratios: Not performed because of known chronic left renal atrophy compatible with chronic occlusive left renal vascular disease. Persistent moderate left hydronephrosis appearing chronic. Interval placement of a right ureteral stent visualized. Echogenic shadowing calculi within the left kidney lower pole noted similar to the prior CT. Several scattered anechoic right renal cysts. Advanced left renal atrophy with increased echogenicity and cortical thinning. Several left renal cysts also noted. No left hydronephrosis or acute finding. Included views of the bladder again demonstrate the ureteral stent within the bladder. By  duplex, the right renal origin vascular stent appears patent. No significant velocity or ratio abnormality to suggest right renal artery stenosis by duplex. IMPRESSION: Patent right renal origin vascular stent. No abnormal velocity or ratio to suggest significant right renal artery stenosis by duplex. Other chronic findings as above. Electronically Signed   By: Jerilynn Mages.  Shick M.D.   On: 01/31/2020 08:33     LOS: 2 days   Antonieta Pert, MD [facility]  02/01/2020, 2:53 PM

## 2020-02-01 NOTE — Evaluation (Signed)
Physical Therapy Evaluation Patient Details Name: Jessica [name] MRN: [id number] DOB: Jul 17, 1929 Today's Date: 02/01/2020   History of Present Illness  presented to ER secondary to urinary frequency; admitted for management of AKI on CKD.  Clinical Impression  Patient resting in bed upon arrival to room; agreeable to session with min encouragement.  Alert and oriented to basic information; follows commands and participates well.  Bilat UE/LE strength and ROM grossly symmetrical and WFL; no focal weakness appreciated.  Able to complete bed mobility with sup; sit/stand, basic transfers and gait (50') with RW, cga/min assist.  Demonstrates partially reciprocal stepping pattern with fair step height/length; mild forward trunk flexion with mod WBing on RW. Broad turning radius, limited balance reactions evident.  Notably fatigued with exertion; distance limited as result. Would benefit from skilled PT to address above deficits and promote optimal return to PLOF.; Recommend transition to HHPT upon discharge from acute hospitalization.  Sitting vitals: BP 139/65, HR 70 Standing vitals: BP 137/76, HR 68    Follow Up Recommendations Home health PT    Equipment Recommendations       Recommendations for Other Services       Precautions / Restrictions Precautions Precautions: Fall;ICD/Pacemaker Restrictions Weight Bearing Restrictions: No      Mobility  Bed Mobility Overal bed mobility: Needs Assistance Bed Mobility: Supine to Sit     Supine to sit: Supervision          Transfers Overall transfer level: Needs assistance Equipment used: Rolling walker (2 wheeled) Transfers: Sit to/from Stand Sit to Stand: Min guard         General transfer comment: cuing for hand placement with sit/stand  Ambulation/Gait Ambulation/Gait assistance: Min guard Gait Distance (Feet): 50 Feet Assistive device: Rolling walker (2 wheeled)       General Gait Details: partially reciprocal  stepping pattern with fair step height/length; mild forward trunk flexion with mod WBing on RW. Broad turning radius, limited balance reactions evident.  Notably fatigued with exertion; distance limited as result.  Stairs            Wheelchair Mobility    Modified Rankin (Stroke Patients Only)       Balance Overall balance assessment: Needs assistance Sitting-balance support: No upper extremity supported;Feet supported Sitting balance-Leahy Scale: Good     Standing balance support: Bilateral upper extremity supported Standing balance-Leahy Scale: Fair                               Pertinent Vitals/Pain Pain Assessment: No/denies pain    Home Living Family/patient expects to be discharged to:: Assisted living               Home Equipment: Gilford Rile - 2 wheels Additional Comments: Resident of [facility]    Prior Function Level of Independence: Needs assistance         Comments: Ambulatory with 4WRW for mobility; staff assists with ADLs as needed     Hand Dominance   Dominant Hand: Right    Extremity/Trunk Assessment   Upper Extremity Assessment Upper Extremity Assessment: Overall WFL for tasks assessed    Lower Extremity Assessment Lower Extremity Assessment: Generalized weakness (grossly 4-/5 throughout)       Communication   Communication: No difficulties  Cognition Arousal/Alertness: Awake/alert Behavior During Therapy: WFL for tasks assessed/performed Overall Cognitive Status: Within Functional Limits for tasks assessed  General Comments      Exercises     Assessment/Plan    PT Assessment Patient needs continued PT services  PT Problem List Decreased strength;Decreased activity tolerance;Decreased balance;Decreased mobility;Cardiopulmonary status limiting activity;Decreased knowledge of use of DME;Decreased safety awareness;Decreased knowledge of precautions        PT Treatment Interventions Gait training;DME instruction;Therapeutic exercise;Balance training;Functional mobility training;Therapeutic activities;Patient/family education    PT Goals (Current goals can be found in the Care Plan section)  Acute Rehab PT Goals Patient Stated Goal: to go home PT Goal Formulation: With patient Time For Goal Achievement: 02/15/20 Potential to Achieve Goals: Good    Frequency Min 2X/week   Barriers to discharge        Co-evaluation               AM-PAC PT "6 Clicks" Mobility  Outcome Measure Help needed turning from your back to your side while in a flat bed without using bedrails?: None Help needed moving from lying on your back to sitting on the side of a flat bed without using bedrails?: None Help needed moving to and from a bed to a chair (including a wheelchair)?: A Little Help needed standing up from a chair using your arms (e.g., wheelchair or bedside chair)?: A Little Help needed to walk in hospital room?: A Little Help needed climbing 3-5 steps with a railing? : A Lot 6 Click Score: 19    End of Session Equipment Utilized During Treatment: Gait belt Activity Tolerance: Patient tolerated treatment well Patient left: with call bell/phone within reach;in chair;with chair alarm set Nurse Communication: Mobility status PT Visit Diagnosis: Muscle weakness (generalized) (M62.81);Difficulty in walking, not elsewhere classified (R26.2);Unsteadiness on feet (R26.81)    Time: 3875-6433 PT Time Calculation (min) (ACUTE ONLY): 19 min   Charges:   PT Evaluation $PT Eval Moderate Complexity: 1 Mod          Bryer Gottsch H. Owens Shark, PT, DPT, NCS 02/01/20, 10:23 PM [phone]

## 2020-02-01 NOTE — Progress Notes (Signed)
[facility]  ROUNDING NOTE   Subjective:   Patient presents via EMS from [facility] with new onset of urinary incontinence.  Patient was recently discharged from this hospital on January 4.  On January 6, she had TURBT, cystoscopy with stone extraction and stent placement in the right ureter.  Patient has right upper tract urothelial carcinoma and bladder tumor which was thought to be causing right hydronephrosis. Serum creatinine has improved to 2.68 on January 3 but presenting creatinine is up to 3.96 with BUN of 85 She underwent a CT scan of the abdomen at admission which shows no definite ureteral calculus, similar right hydronephrosis, multiple punctate calcified calculi within the right lower pole collecting system small bilateral pleural effusions and cirrhosis   No acute complaints today.  States that overall she feels okay. Oxybutynin started for frequency of voiding Able to eat without nausea or vomiting Requiring oxygen supplementation by nasal cannula  Objective:  Vital signs in last 24 hours:  Temp:  [97.4 F (36.3 C)-98.6 F (37 C)] 97.4 F (36.3 C) (01/10 1132) Pulse Rate:  [70] 70 (01/10 1132) Resp:  [16-22] 18 (01/10 1132) BP: (131-149)/(65-80) 147/67 (01/10 1132) SpO2:  [97 %-99 %] 98 % (01/10 1132)  Weight change:  Filed Weights   01/30/20 0729 01/31/20 0141  Weight: 61.2 kg 61.1 kg    Intake/Output: I/O last 3 completed shifts: In: 100 [IV Piggyback:100] Out: 1050 [Urine:1050]   Intake/Output this shift:  Total I/O In: 480 [P.O.:480] Out: 100 [Urine:100]  Physical Exam: General:  No acute distress, laying in the bed  HEENT  anicteric, moist oral mucous membrane  Pulm/lungs  normal breathing effort, O2 by [place]  CVS/Heart  irregular rhythm, no rub or gallop  Abdomen:   Soft, nontender  Extremities:  1+ peripheral edema over the ankles bilaterally  Neurologic:  Alert, oriented, able to follow commands  Skin:  No acute  rashes    Basic Metabolic Panel: Recent Labs  Lab 01/30/20 0746 01/31/20 0423 02/01/20 0525  NA 137 141 140  K 3.3* 3.4* 3.6  CL 104 110 107  CO2 22 21* 19*  GLUCOSE 104* 84 71  BUN 85* 93* 96*  CREATININE 3.96* 3.96* 4.20*  CALCIUM 8.7* 8.1* 8.2*  MG 1.8  --   --     Liver Function Tests: Recent Labs  Lab 01/30/20 0746  AST 20  ALT 16  ALKPHOS 65  BILITOT 0.8  PROT 6.4*  ALBUMIN 3.6   No results for input(s): LIPASE, AMYLASE in the last 168 hours. No results for input(s): AMMONIA in the last 168 hours.  CBC: Recent Labs  Lab 01/30/20 0746 01/31/20 0423 02/01/20 0525  WBC 12.2* 8.4 8.5  HGB 10.9* 10.5* 10.9*  HCT 32.9* 31.2* 33.0*  MCV 90.1 90.4 90.9  PLT 185 121* 122*    Cardiac Enzymes: No results for input(s): CKTOTAL, CKMB, CKMBINDEX, TROPONINI in the last 168 hours.  BNP: Invalid input(s): POCBNP  CBG: No results for input(s): GLUCAP in the last 168 hours.  Microbiology: Results for orders placed or performed during the hospital encounter of 01/30/20  Urine Culture     Status: None   Collection Time: 01/30/20  7:46 AM   Specimen: Urine, Random  Result Value Ref Range Status   Specimen Description   Final    URINE, RANDOM Performed at [facility] [address] [place], [place] [address]    Special Requests   Final    NONE  Performed at [facility], [address] [place], [place] [address]    Culture   Final    NO GROWTH Performed at [facility], [place] [address] [place], [place] [address]    Report Status 01/31/2020 FINAL  Final  SARS CORONAVIRUS 2 (TAT 6-24 HRS) Nasopharyngeal     Status: None   Collection Time: 01/30/20  9:33 AM   Specimen: Nasopharyngeal  Result Value Ref Range Status   SARS Coronavirus 2 NEGATIVE NEGATIVE Final    Comment: (NOTE) SARS-CoV-2 target nucleic acids are NOT DETECTED.  The SARS-CoV-2 RNA is generally detectable in upper and lower respiratory specimens during the  acute phase of infection. Negative results do not preclude SARS-CoV-2 infection, do not rule out co-infections with other pathogens, and should not be used as the sole basis for treatment or other patient management decisions. Negative results must be combined with clinical observations, patient history, and epidemiological information. The expected result is Negative.  Fact Sheet for Patients: SugarRoll.be  Fact Sheet for Healthcare Providers: https://www.woods-mathews.com/  This test is not yet approved or cleared by the [place] FDA and  has been authorized for detection and/or diagnosis of SARS-CoV-2 by FDA under an Emergency Use Authorization (EUA). This EUA will remain  in effect (meaning this test can be used) for the duration of the COVID-19 declaration under Se ction 564(b)(1) of the Act, 21 U.S.C. section 360bbb-3(b)(1), unless the authorization is terminated or revoked sooner.  Performed at [facility], [place] [address], [place], [place] [address]     Coagulation Studies: No results for input(s): LABPROT, INR in the last 72 hours.  Urinalysis: Recent Labs    01/30/20 0746  COLORURINE STRAW*  LABSPEC 1.008  PHURINE 5.0  GLUCOSEU NEGATIVE  HGBUR LARGE*  BILIRUBINUR NEGATIVE  KETONESUR NEGATIVE  PROTEINUR NEGATIVE  NITRITE NEGATIVE  LEUKOCYTESUR TRACE*      Imaging: US RENAL ARTERY DUPLEX LIMITED  Result Date: 01/31/2020 CLINICAL DATA:  Acute kidney injury superimposed on chronic kidney disease. Right ureteral stent, history of right renal artery stent as well and chronic left renal atrophy EXAM: RENAL DUPLEX DOPPLER ULTRASOUND (limited) TECHNIQUE: Duplex and color Doppler ultrasound was utilized to evaluate blood flow in the renal arteries and kidneys. COMPARISON:  01/30/2020 FINDINGS: Right Renal Artery Velocities: Origin:  66/10 cm/sec Mid:  80/12 cm/sec [place]:  65/11 cm/sec Interlobar:  53/6 cm/sec Arcuate:  23/5 cm/sec Left Renal Artery Velocities: Not obtained because of known chronic left renal atrophy compatible with chronic occlusive left renal vascular disease. Aortic Velocity: 44 cm/sec Right Renal-Aortic Ratios: Origin: 1.5 Mid:  1.8 Hilum: 1.5 Interlobar: 1.2 Arcuate: 0.5 Left Renal-Aortic Ratios: Not performed because of known chronic left renal atrophy compatible with chronic occlusive left renal vascular disease. Persistent moderate left hydronephrosis appearing chronic. Interval placement of a right ureteral stent visualized. Echogenic shadowing calculi within the left kidney lower pole noted similar to the prior CT. Several scattered anechoic right renal cysts. Advanced left renal atrophy with increased echogenicity and cortical thinning. Several left renal cysts also noted. No left hydronephrosis or acute finding. Included views of the bladder again demonstrate the ureteral stent within the bladder. By duplex, the right renal origin vascular stent appears patent. No significant velocity or ratio abnormality to suggest right renal artery stenosis by duplex. IMPRESSION: Patent right renal origin vascular stent. No abnormal velocity or ratio to suggest significant right renal artery stenosis by duplex. Other chronic findings as above. Electronically Signed   By:  M.  Shick M.D.   On: 01/31/2020 08:33     Medications:    Scheduled Meds: Continuous Infusions: PRN Meds:.    Assessment/ Plan:   Jessica [name] is a 87 y.o. white female with atrophic left kidney, urothelial cell carcinoma, nephrolithiasis, abdominal aortic aneurysm, complete heart block status post pacemaker placement, COPD, chronic home O2, hypertension, hyperlipidemia who was admitted to [facility] on 01/30/2020  Recent Right ureteral stent placed on 1/2 by Dr. Diamantina Providence.   1.  Acute kidney injury on chronic kidney disease stage IIIb: baseline creatinine of 1.55, GFR of 32 on 12/28/19.  Lab Results  Component Value Date    CREATININE 4.20 (H) 02/01/2020   CREATININE 3.96 (H) 01/31/2020   CREATININE 3.96 (H) 01/30/2020   01/09 0701 - 01/10 0700 In: 0  Out: 1050 [Urine:1050] Urinalysis today shows large hemoglobin, negative for protein Imaging: CT report as above  AKI is likely multifactorial with possible contribution from volume depletion as patient was on Lasix as outpatient  Serum creatinine appears to have peaked.  We will continue to monitor  Plan: Hold Lasix Allow liberal oral intake Electrolytes and volume status are acceptable.  No acute indication for dialysis at present.   Discussed with patient and her son regarding possibility of worsening renal failure.  Patient states that she would not want aggressive treatment such as dialysis at her age.    2.  Hypokalemia Expected to improve with normal diet   3.  Right hydronephrosis Due to bladder tumor and right upper tract urothelial carcinoma Status post right ureteral stent placement on January 24, 2019 oxybutinin for frequency of voiding   LOS: 2 Baruch Lewers 1/10/20222:58 PM

## 2020-02-02 DIAGNOSIS — N179 Acute kidney failure, unspecified: Secondary | ICD-10-CM | POA: Diagnosis not present

## 2020-02-02 DIAGNOSIS — N189 Chronic kidney disease, unspecified: Secondary | ICD-10-CM | POA: Diagnosis not present

## 2020-02-02 DIAGNOSIS — C689 Malignant neoplasm of urinary organ, unspecified: Secondary | ICD-10-CM

## 2020-02-02 LAB — BASIC METABOLIC PANEL
Anion gap: 15 (ref 5–15)
BUN: 100 mg/dL — ABNORMAL HIGH (ref 8–23)
CO2: 19 mmol/L — ABNORMAL LOW (ref 22–32)
Calcium: 8.1 mg/dL — ABNORMAL LOW (ref 8.9–10.3)
Chloride: 104 mmol/L (ref 98–111)
Creatinine, Ser: 4.51 mg/dL — ABNORMAL HIGH (ref 0.44–1.00)
GFR, Estimated: 9 mL/min — ABNORMAL LOW (ref >60)
Glucose, Bld: 111 mg/dL — ABNORMAL HIGH (ref 70–99)
Potassium: 3.2 mmol/L — ABNORMAL LOW (ref 3.5–5.1)
Sodium: 138 mmol/L (ref 135–145)

## 2020-02-02 LAB — CALCULI, WITH PHOTOGRAPH (CLINICAL LAB)
Calcium Oxalate Dihydrate: 50 %
Calcium Oxalate Monohydrate: 50 %
Weight Calculi: 26 mg

## 2020-02-02 MED ORDER — OXYBUTYNIN CHLORIDE ER 5 MG PO TB24
5.0000 mg | ORAL_TABLET | Freq: Every day | ORAL | 0 refills | Status: AC
Start: 2020-02-02 — End: 2020-03-03

## 2020-02-02 MED ORDER — OXYBUTYNIN CHLORIDE ER 5 MG PO TB24
5.0000 mg | ORAL_TABLET | Freq: Every day | ORAL | 0 refills | Status: DC
Start: 2020-02-02 — End: 2020-02-02

## 2020-02-02 NOTE — Progress Notes (Signed)
Discharge instructions reviewed with the patient. Patient has oxygen for discharge. Patient sent out via wheelchair with belongings

## 2020-02-02 NOTE — NC FL2 (Signed)
[place] LEVEL OF CARE SCREENING TOOL     IDENTIFICATION  Patient Name: Jessica [name] Birthdate: [birth year] Sex: female Admission Date (Current Location): 01/30/2020  [place] and [place] Number:  Engineering geologist and Address:  [facility], [address], [place],  [address]      Provider Number: [id number]  Attending Physician Name and Address:  Wyvonnia Dusky, MD  Relative Name and Phone Number:       Current Level of Care: Hospital Recommended Level of Care: [place] Prior Approval Number:    Date Approved/Denied:   PASRR Number:    Discharge Plan: Other (Comment) (ALF)    Current Diagnoses: Patient Active Problem List   Diagnosis Date Noted  . Acute kidney injury superimposed on chronic kidney disease ([place]) 01/30/2020  . Hypoxia   . Acute renal failure superimposed on stage 3a chronic kidney disease ([place]) 01/21/2020  . Hypokalemia 01/21/2020  . Atypical chest pain 08/25/2019  . Urothelial carcinoma ([place]) 01/18/2019  . Nephrolithiasis 01/18/2019  . Urinary tract infection symptoms 12/31/2018  . Bleeding hemorrhoids 02/27/2018  . Weight loss 08/30/2017  . Low back pain 08/30/2017  . Depression, major, single episode, mild ([place]) 05/20/2017  . Fall 05/20/2017  . Hypoglycemia 04/04/2017  . Gout 06/05/2016  . AAA (abdominal aortic aneurysm) without rupture ([place]) 04/18/2016  . Loss of sense of smell 12/30/2015  . Memory difficulties 12/30/2015  . Injury of right lower extremity and cellulitis 11/29/2014  . 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 Renal artery stenosis ([place]) 09/15/2014  . 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 CN (constipation) 09/06/2014  . Change in stool caliber 09/06/2014  . Atherosclerosis of aorto-iliac bypass graft ([place]) 08/17/2014  . Shortness of breath 05/12/2014  . Conjunctival hemorrhage of right eye 08/13/2013  . Medicare annual wellness visit, subsequent  04/22/2013  . Allergic rhinitis 11/26/2012  . COPD exacerbation ([place]) 09/27/2011  . Hyperlipidemia 02/22/2011  . Long term current use of anticoagulant 04/12/2010  . HYPERTENSION, BENIGN 08/25/2009  . Atrial fibrillation ([place]) 03/07/2009  . Carotid stenosis 08/24/2008  . AV BLOCK, COMPLETE 06/17/2008  . Aneurysm of abdominal vessel ([place]) 06/17/2008  . PACEMAKER-[place] 06/17/2008    Orientation RESPIRATION BLADDER Height & Weight     Self,Time,Situation,Place  Normal (2L) Continent Weight: 61.1 kg Height:  5\' 7"  (170.2 cm)  BEHAVIORAL SYMPTOMS/MOOD NEUROLOGICAL BOWEL NUTRITION STATUS      Continent    AMBULATORY STATUS COMMUNICATION OF NEEDS Skin   Limited Assist Verbally Normal                       Personal Care Assistance Level of Assistance    Bathing Assistance: Limited assistance Feeding assistance: Independent Dressing Assistance: Limited assistance     Functional Limitations Info             SPECIAL CARE FACTORS FREQUENCY  PT (By licensed PT),OT (By licensed OT)     PT Frequency: [facility] OT Frequency: [facility]            Contractures Contractures Info: Not present    Additional Factors Info  Code Status,Allergies Code Status Info: DNR Allergies Info: Codeie, morphone, zyrtec           Medication List    TAKE these medications   acetaminophen 500 MG tablet Commonly known as: TYLENOL Take 500 mg by mouth every 6 (six) hours as needed for moderate pain or headache.  albuterol 108 (90 Base) MCG/ACT inhaler Commonly known as: ProAir HFA Inhale 2 puffs into the lungs every 6 (six) hours as needed for wheezing or shortness of breath.   Allergy Relief 10 MG tablet Generic drug: loratadine Take 10 mg by mouth daily.   amLODipine 5 MG tablet Commonly known as: NORVASC Take 1 tablet (5 mg total) by mouth daily. Please call to schedule office visit for further refills.   atenolol 25 MG tablet Commonly  known as: TENORMIN Take 1 tablet (25 mg total) by mouth daily. Please call to schedule office visit for further refills.   cholecalciferol 25 MCG (1000 UNIT) tablet Commonly known as: VITAMIN D3 Take 1,000 Units by mouth daily.   dextromethorphan-guaiFENesin 30-600 MG 12hr tablet Commonly known as: MUCINEX DM Take 1 tablet by mouth 2 (two) times daily as needed for cough.   Fluticasone-Salmeterol 250-50 MCG/DOSE Aepb Commonly known as: Advair Diskus Inhale 1 puff into the lungs in the morning and at bedtime.   furosemide 20 MG tablet Commonly known as: LASIX Take 1 tablet (20 mg total) by mouth as needed (for shortness of breath/edema).   melatonin 3 MG Tabs tablet Take 1 tablet (3 mg) by mouth once daily at bedtime as needed for sleep   oxybutynin 5 MG 24 hr tablet Commonly known as: DITROPAN-XL Take 1 tablet (5 mg total) by mouth at bedtime.   rosuvastatin 5 MG tablet Commonly known as: CRESTOR Take 1 tablet (5 mg total) by mouth daily.   senna-docusate 8.6-50 MG tablet Commonly known as: Senokot-S Take 2 tablets by mouth once daily at bedtime as needed for constipation   tamsulosin 0.4 MG Caps capsule Commonly known as: FLOMAX Take 1 capsule (0.4 mg total) by mouth daily.   vitamin B-12 1000 MCG tablet Commonly known as: CYANOCOBALAMIN Take 1 tablet (1000 mg) by mouth once daily at bedtime    Relevant Imaging Results:  Relevant Lab Results:   Additional Information SSN:[id number]  Beverly Sessions, RN

## 2020-02-02 NOTE — Progress Notes (Signed)
[facility]  ROUNDING NOTE   Subjective:   Patient presents via EMS from [facility] with new onset of urinary incontinence.  Patient was recently discharged from this hospital on January 4.  On January 6, she had TURBT, cystoscopy with stone extraction and stent placement in the right ureter.  Patient has right upper tract urothelial carcinoma and bladder tumor which was thought to be causing right hydronephrosis. Serum creatinine has improved to 2.68 on January 3 but presenting creatinine is up to 3.96 with BUN of 85 She underwent a CT scan of the abdomen at admission which shows no definite ureteral calculus, similar right hydronephrosis, multiple punctate calcified calculi within the right lower pole collecting system small bilateral pleural effusions and cirrhosis   No acute complaints today.  States that overall she feels okay. Oxybutynin started for frequency of voiding. States she is able to void without problem Able to eat without nausea or vomiting Today, on room air  Objective:  Vital signs in last 24 hours:  Temp:  [97.6 F (36.4 C)-98.2 F (36.8 C)] 98.2 F (36.8 C) (01/11 1534) Pulse Rate:  [69-70] 70 (01/11 1534) Resp:  [16-18] 17 (01/11 1534) BP: (126-150)/(55-73) 137/73 (01/11 1534) SpO2:  [94 %-98 %] 98 % (01/11 1534)  Weight change:  Filed Weights   01/30/20 0729 01/31/20 0141  Weight: 61.2 kg 61.1 kg    Intake/Output: I/O last 3 completed shifts: In: 480 [P.O.:480] Out: 450 [Urine:450]   Intake/Output this shift:  No intake/output data recorded.  Physical Exam: General:  No acute distress, laying in the bed  HEENT  anicteric, moist oral mucous membrane  Pulm/lungs  normal breathing effort,  CVS/Heart  irregular rhythm, no rub or gallop  Abdomen:   Soft, nontender  Extremities:  1+ peripheral edema over the ankles bilaterally  Neurologic:  Alert, oriented, able to follow commands  Skin:  No acute rashes    Basic  Metabolic Panel: Recent Labs  Lab 01/30/20 0746 01/31/20 0423 02/01/20 0525 02/02/20 0515  NA 137 141 140 138  K 3.3* 3.4* 3.6 3.2*  CL 104 110 107 104  CO2 22 21* 19* 19*  GLUCOSE 104* 84 71 111*  BUN 85* 93* 96* 100*  CREATININE 3.96* 3.96* 4.20* 4.51*  CALCIUM 8.7* 8.1* 8.2* 8.1*  MG 1.8  --   --   --     Liver Function Tests: Recent Labs  Lab 01/30/20 0746  AST 20  ALT 16  ALKPHOS 65  BILITOT 0.8  PROT 6.4*  ALBUMIN 3.6   No results for input(s): LIPASE, AMYLASE in the last 168 hours. No results for input(s): AMMONIA in the last 168 hours.  CBC: Recent Labs  Lab 01/30/20 0746 01/31/20 0423 02/01/20 0525  WBC 12.2* 8.4 8.5  HGB 10.9* 10.5* 10.9*  HCT 32.9* 31.2* 33.0*  MCV 90.1 90.4 90.9  PLT 185 121* 122*    Cardiac Enzymes: No results for input(s): CKTOTAL, CKMB, CKMBINDEX, TROPONINI in the last 168 hours.  BNP: Invalid input(s): POCBNP  CBG: No results for input(s): GLUCAP in the last 168 hours.  Microbiology: Results for orders placed or performed during the hospital encounter of 01/30/20  Urine Culture     Status: None   Collection Time: 01/30/20  7:46 AM   Specimen: Urine, Random  Result Value Ref Range Status   Specimen Description   Final    URINE, RANDOM Performed at [facility], [address], [place], [place] [address]  Special Requests   Final    NONE Performed at [facility], [address], [place], [place] [address]    Culture   Final    NO GROWTH Performed at [facility], [place] [address] [place], [place] [address]    Report Status 01/31/2020 FINAL  Final  SARS CORONAVIRUS 2 (TAT 6-24 HRS) Nasopharyngeal     Status: None   Collection Time: 01/30/20  9:33 AM   Specimen: Nasopharyngeal  Result Value Ref Range Status   SARS Coronavirus 2 NEGATIVE NEGATIVE Final    Comment: (NOTE) SARS-CoV-2 target nucleic acids are NOT DETECTED.  The SARS-CoV-2 RNA is generally detectable in upper and  lower respiratory specimens during the acute phase of infection. Negative results do not preclude SARS-CoV-2 infection, do not rule out co-infections with other pathogens, and should not be used as the sole basis for treatment or other patient management decisions. Negative results must be combined with clinical observations, patient history, and epidemiological information. The expected result is Negative.  Fact Sheet for Patients: SugarRoll.be  Fact Sheet for Healthcare Providers: https://www.woods-mathews.com/  This test is not yet approved or cleared by the [place] FDA and  has been authorized for detection and/or diagnosis of SARS-CoV-2 by FDA under an Emergency Use Authorization (EUA). This EUA will remain  in effect (meaning this test can be used) for the duration of the COVID-19 declaration under Se ction 564(b)(1) of the Act, 21 U.S.C. section 360bbb-3(b)(1), unless the authorization is terminated or revoked sooner.  Performed at [facility], [place] [address] [place], [place] [address]     Coagulation Studies: No results for input(s): LABPROT, INR in the last 72 hours.  Urinalysis: No results for input(s): COLORURINE, LABSPEC, PHURINE, GLUCOSEU, HGBUR, BILIRUBINUR, KETONESUR, PROTEINUR, UROBILINOGEN, NITRITE, LEUKOCYTESUR in the last 72 hours.  Invalid input(s): APPERANCEUR    Imaging: No results found.   Medications:    Scheduled Meds: Continuous Infusions: PRN Meds:.    Assessment/ Plan:   [name] is a 87 y.o. white female with atrophic left kidney, urothelial cell carcinoma, nephrolithiasis, abdominal aortic aneurysm, complete heart block status post pacemaker placement, COPD, chronic home O2, hypertension, hyperlipidemia who was admitted to [facility] on 01/30/2020  Recent Right ureteral stent placed on 1/2 by Dr. Diamantina Providence.   1.  Acute kidney injury on chronic kidney disease stage IIIb:  baseline creatinine of 1.55, GFR of 32 on 12/28/19.  Lab Results  Component Value Date   CREATININE 4.51 (H) 02/02/2020   CREATININE 4.20 (H) 02/01/2020   CREATININE 3.96 (H) 01/31/2020   01/10 0701 - 01/11 0700 In: 480 [P.O.:480] Out: 100 [Urine:100] Urinalysis today shows large hemoglobin, negative for protein Imaging: CT report as above  AKI is likely multifactorial with possible contribution from volume depletion as patient was on Lasix as outpatient  Serum creatinine appears to have peaked.  We will continue to monitor  Plan: Hold Lasix Allow liberal oral intake Electrolytes and volume status are acceptable.  No acute indication for dialysis at present.   Discussed with patient and her son regarding possibility of worsening renal failure.  Patient states that she would not want aggressive treatment such as dialysis at her age.   F/u BMP as outpatient  2.  Hypokalemia Expected to improve with normal diet   3.  Right hydronephrosis Due to bladder tumor and right upper tract urothelial carcinoma Status post right ureteral stent placement on January 24, 2019 oxybutinin for frequency of voiding  LOS: 3 Grenda Lora 1/11/20224:13 PM

## 2020-02-02 NOTE — TOC Transition Note (Signed)
Transition of Care [facility]) - CM/SW Discharge Note   Patient Details  Name: Jessica [name] MRN: [id number] Date of Birth: 1929/02/05  [facility]) CM/SW Contact:  Beverly Sessions, RN Phone Number: 02/02/2020, 4:41 PM   Clinical Narrative:     Patient returned to [facility] today Portable O2 tank provided by [employer] Son transported  Bedside RN called report [place] faxed to Thayer Headings at [facility] [place]ly with [facility] notified of discharge    Final next level of care: Assisted Living (with home health) Barriers to Discharge: No Barriers Identified   Patient Goals and CMS Choice Patient states their goals for this hospitalization and ongoing recovery are:: to get better   Choice offered to / list presented to : Patient  Discharge Placement                Patient to be transferred to facility by: Son Name of family member notified: son Patient and family notified of of transfer: 01/26/20  Discharge Plan and Services In-house Referral: NA   Post Acute Care Choice: Home Health          DME Arranged: Oxygen DME Agency: [employer] Date DME Agency Contacted: 01/25/20 Time DME Agency Contacted: [phone] Representative spoke with at DME Agency: zach HH Arranged: PT,OT [place] Date [place]: 01/25/20 Time HH Agency Contacted: [phone]    Social Determinants of Health (SDOH) Interventions     Readmission Risk Interventions Readmission Risk Prevention Plan 02/01/2020 01/25/2020  Transportation Screening Complete Complete  PCP or Specialist Appt within 3-5 Days - Complete  HRI or [place] - Complete  Social Work Consult for [place] Planning/Counseling - Complete  Palliative Care Screening - Not Applicable  Medication Review Press photographer) Complete Complete  Palliative Care Screening Not Applicable -  Some recent data might be hidden

## 2020-02-02 NOTE — Progress Notes (Signed)
[place] [facility] [facility]) Hospital Liaison RN note:  Received new referral for [facility] out patient palliative program to follow post discharge from Dr. Eppie Gibson and Isaias Cowman, TOC. Patient information given to referral. Plan is for discharge back to [place] today with [facility].  Thank you for this referral.  Loney Laurence [facility] Liaison [phone]

## 2020-02-02 NOTE — Discharge Summary (Signed)
Physician Discharge Summary  Jessica [name] JKD:[id number] DOB: [birth year] DOA: 01/30/2020  PCP: [facility]  Admit date: 01/30/2020 Discharge date: 02/02/2020  Admitted From: home  Disposition:  Home   Recommendations for Outpatient Follow-up:  1. F/u w/ palliative care as soon as possible   Home Health: yes Equipment/Devices:  Discharge Condition: stable  CODE STATUS: DNR  Diet recommendation: Heart Healthy  Brief/Interim Summary: HPI was taken from Dr. Si Raider: Jessica [name] is a 87 y.o. 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  Discharged on 1/4 of this year. 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 Pt has a left atrophic kidney. Was treated with turbt, debulking, stone removal, and a right ureteral stent was placed. Was also treated for copd exacerbation. Was followed by nephrology and urology during that hospital stay.  Patient reports that since discharged she is plagued by incontinence. Not painful, just not able to control when she urinates. No fevers. Gets occasional right sided colicky pain in RLQ. No n/v/d. No chest pain. Says breathing is more or less back to baseline. No dysuria. No flank pain.  ED Course:   CT stone study, labs show gfr of 10 from about 15 when left hospital 2 days ago. Urology and nephrology consulted.  Hospital Course from Dr. Jimmye Norman 02/02/20: Pt was found to have AKI on CKD likely secondary to untreated urothelial carcinoma. Pt's Cr trending up daily while inpatient but pt did not want HD or any treatment for cancer. Pt was agreeable to palliative care and pt should f/u w/ palliative care outpatient as soon as possible. For more information, please see previous progress/consult notes.    Discharge Diagnoses:  Active Problems:   Acute kidney injury superimposed on chronic kidney disease (HCC)  Urge incontinence: continue on oxybutynin, flomax. No obstructing stone with CT stone not strongly suggestive of infection   AKI on CKDIII b: baseline Cr 1.5 & recent Cr 2.6.  Patient does not want to have aggressive treatment like dialysis. Nephro following and recs apprec  Thrombocytopenia: etiology unclear. Will continue to monitor  Hypokalemia: management as per nephro   ACD: likely secondary to CKD. No need for a transfusion at this time   Right hydronephrosis: s/p stent. Hx of urothelial carcincoma.   PAF:  Continue on atenolol. Eliquis was hold on last hospitalization due to risk of bleeding.   HTN: continue on atenolol, amlodipine   COPD: w/o exacerbation. Continue on bronchodilators & encourage incentive spirometry   AAA: s/p repair   RAS: s/p stent   Discharge Instructions  Discharge Instructions    Diet - low sodium heart healthy   Complete by: As directed    Discharge instructions   Complete by: As directed    F/u w/ palliative care ASAP. F/u w/ PCP in 1 week. F/u w/ nephro, Dr. Lemmie Evens. Candiss Norse, in 1-2 weeks, & will need to have BMP to check Cr/kidney function   Increase activity slowly   Complete by: As directed      Allergies as of 02/02/2020      Reactions   Codeine Other (See Comments)   Made her feel crazy   Morphine Other (See Comments)   Made her feel crazy   Zyrtec [cetirizine] Other (See Comments)   Causes facial numbness  Medication List    TAKE these medications   acetaminophen 500 MG tablet Commonly known as: TYLENOL Take 500 mg by mouth every 6 (six) hours as needed for moderate pain or headache.   albuterol 108 (90 Base) MCG/ACT inhaler Commonly known as: ProAir HFA Inhale 2 puffs into the lungs every 6 (six) hours as needed for wheezing or shortness of breath.   Allergy Relief 10 MG tablet Generic drug: loratadine Take  10 mg by mouth daily.   amLODipine 5 MG tablet Commonly known as: NORVASC Take 1 tablet (5 mg total) by mouth daily. Please call to schedule office visit for further refills.   atenolol 25 MG tablet Commonly known as: TENORMIN Take 1 tablet (25 mg total) by mouth daily. Please call to schedule office visit for further refills.   cholecalciferol 25 MCG (1000 UNIT) tablet Commonly known as: VITAMIN D3 Take 1,000 Units by mouth daily.   dextromethorphan-guaiFENesin 30-600 MG 12hr tablet Commonly known as: MUCINEX DM Take 1 tablet by mouth 2 (two) times daily as needed for cough.   Fluticasone-Salmeterol 250-50 MCG/DOSE Aepb Commonly known as: Advair Diskus Inhale 1 puff into the lungs in the morning and at bedtime.   furosemide 20 MG tablet Commonly known as: LASIX Take 1 tablet (20 mg total) by mouth as needed (for shortness of breath/edema).   melatonin 3 MG Tabs tablet Take 1 tablet (3 mg) by mouth once daily at bedtime as needed for sleep   oxybutynin 5 MG 24 hr tablet Commonly known as: DITROPAN-XL Take 1 tablet (5 mg total) by mouth at bedtime.   rosuvastatin 5 MG tablet Commonly known as: CRESTOR Take 1 tablet (5 mg total) by mouth daily.   senna-docusate 8.6-50 MG tablet Commonly known as: Senokot-S Take 2 tablets by mouth once daily at bedtime as needed for constipation   tamsulosin 0.4 MG Caps capsule Commonly known as: FLOMAX Take 1 capsule (0.4 mg total) by mouth daily.   vitamin B-12 1000 MCG tablet Commonly known as: CYANOCOBALAMIN Take 1 tablet (1000 mg) by mouth once daily at bedtime       Follow-up Information    [facility] Follow up in 1 week(s).   Specialty: Geriatric Medicine Contact information: [address] [place] [address] [phone]        Minna Merritts, MD .   Specialty: Cardiology Contact information: [place] [address] [phone]        Murlean Iba, MD Follow up in 1 week(s).   Specialty: Nephrology Contact information: [place] [address] [phone]              Allergies  Allergen Reactions  . Codeine Other (See Comments)    Made her feel crazy  . Morphine Other (See Comments)    Made her feel crazy  . Zyrtec [Cetirizine] Other (See Comments)    Causes facial numbness    Consultations:  nephro   Palliative care (outpatient)    Procedures/Studies: CT ABDOMEN PELVIS WO CONTRAST  Result Date: 01/22/2020 CLINICAL DATA:  87 year old female with history of right upper tract urothelial carcinoma. EXAM: CT ABDOMEN AND PELVIS WITHOUT CONTRAST TECHNIQUE: Multidetector CT imaging of the abdomen and pelvis was performed following the standard protocol without IV contrast. COMPARISON:  CT the abdomen and pelvis 12/25/2018. FINDINGS: Lower chest: Small right and trace left pleural effusions. Cardiomegaly. Severe calcifications of the aortic valve. Atherosclerotic calcifications in the descending  thoracic aorta. Pacemaker leads in the right atrium and right ventricle. Left-sided fat containing Bochdalek's hernia. Hepatobiliary: Liver has a shrunken appearance and nodular contour, suggesting underlying cirrhosis. Small focus of low attenuation in segment 4B adjacent to the falciform ligament, most compatible with an area of focal fatty infiltration. No other definite suspicious cystic or solid hepatic lesions are confidently identified on today's noncontrast CT examination. Subcentimeter calcified gallstone lying dependently in the neck of the gallbladder. No findings to suggest an acute cholecystitis are noted at this time. Pancreas: No definite pancreatic mass or peripancreatic fluid collections or inflammatory changes noted on today's noncontrast CT examination. Spleen: Unremarkable. Adrenals/Urinary Tract: Severe atrophy of the left kidney. Multiple low-attenuation lesions in both kidneys,  incompletely characterized on today's non-contrast CT examination, but statistically likely to represent cysts. Multiple tiny nonobstructive calculi within the right renal collecting system measuring 2-3 mm in size. In addition, there are several 2-3 mm calculi lying dependently in the distal third of the right ureter shortly before the right ureterovesicular junction, best appreciated on coronal image 53 of series 5. There is mild right-sided hydroureter and moderate right-sided hydronephrosis. This appears related to a intermediate attenuation structure at the level of the right ureterovesicular junction (axial image 74 of series 2 and coronal image 50 of series 5) which measures approximately 1.3 x 1.3 x 1.2 cm. In addition, there is some intermediate attenuation thickening of the structures in the right renal hilum, poorly demonstrated on today's noncontrast CT examination. These findings are concerning for tumor. No left-sided hydroureteronephrosis. Bilateral adrenal glands are normal in appearance. Stomach/Bowel: Unenhanced appearance of the stomach is normal. There is no pathologic dilatation of small bowel or colon. Numerous colonic diverticulae are noted, without surrounding inflammatory changes to suggest an acute diverticulitis at this time. Status post appendectomy. Short segment of mid transverse colon extending into a small epigastric ventral hernia. Vascular/Lymphatic: Aortic atherosclerosis with postoperative changes of prior aorto bi-iliac bypass graft, poorly evaluated on today's noncontrast CT examination. 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 No definite lymphadenopathy identified in the abdomen or pelvis. Reproductive: Uterus and ovaries are atrophic. Small  calcifications within the uterus, likely reflective of calcified fibroids. Other: Postoperative changes of mesh repair for ventral hernia. Trace volume of ascites. No pneumoperitoneum. Musculoskeletal: There are no aggressive appearing lytic or blastic lesions noted in the visualized portions of the skeleton. IMPRESSION: 1. Moderate right hydronephrosis and mild right hydroureter with intermediate attenuation structure at the right ureterovesicular junction concerning for partially obstructing tumor (although thrombus could have a similar appearance). There is also probable tumor in the right renal collecting system poorly evaluated on today's noncontrast CT examination. 2. Multiple nonobstructive calculi in the right renal collecting system measuring 2-3 mm in size, as well as a cluster of small 2-3 mm calculi lying dependently in the dilated distal third of the right ureter. 3. Cirrhosis with trace volume of ascites. 4. Cardiomegaly with small right and trace left pleural effusions. 5. There are calcifications of the aortic valve. Echocardiographic correlation for evaluation of potential valvular dysfunction may be warranted if clinically indicated. 6. 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 7. Additional incidental findings, as above. Electronically Signed   By: Vinnie Langton M.D.   On: 01/22/2020 16:35   DG Chest 2  View  Result Date: 01/30/2020 CLINICAL DATA:  87 year old female with shortness of breath. EXAM: CHEST - 2 VIEW COMPARISON:  01/21/2020 FINDINGS: Mild cardiomegaly, unchanged. The cardiomediastinal silhouette is otherwise within normal limits. Similar position of dual lead left subclavian approach pacemaker with an abandoned lead noted. Unchanged diffuse mild reticular opacities, most prominent the lung bases. Flattening of the hemidiaphragms bilaterally with hyperlucency in apices. No new focal consolidations. Similar trace right pleural  effusion. Atherosclerotic calcifications of the thoracic aorta. No acute osseous abnormality. IMPRESSION: Similar appearing trace right pleural effusion and unchanged cardiomegaly. Similar appearing emphysematous changes. Aortic Atherosclerosis (ICD10-I70.0) and Emphysema (ICD10-J43.9). Electronically Signed   By: Ruthann Cancer MD   On: 01/30/2020 08:39   DG Chest 2 View  Result Date: 01/21/2020 CLINICAL DATA:  Dyspnea with exertion. EXAM: CHEST - 2 VIEW COMPARISON:  December 28, 2019. FINDINGS: Stable cardiomediastinal silhouette. Left-sided pacemaker is unchanged in position. No pneumothorax or pleural effusion is noted. Minimal bibasilar subsegmental atelectasis is noted. Minimal right pleural effusion is noted. Bony thorax is unremarkable. IMPRESSION: Minimal bibasilar subsegmental atelectasis. Minimal right pleural effusion. Aortic Atherosclerosis (ICD10-I70.0). Electronically Signed   By: Marijo Conception M.D.   On: 01/21/2020 09:45   US RENAL  Result Date: 01/22/2020 CLINICAL DATA:  Acute kidney injury EXAM: RENAL / URINARY TRACT ULTRASOUND COMPLETE COMPARISON:  CT abdomen dated 12/25/2018 FINDINGS: Right Kidney: Renal measurements: 12.1 x 7.3 x 5.9 cm = volume: 274 mL. Cortical echogenicity is within normal limits. Multiple benign cysts. No suspicious mass is seen. Mild hydronephrosis. Left Kidney: Renal measurements: 7 x 3.9 x 3.9 cm = volume: 57 mL. Echogenic renal cortex. Multiple benign cysts. No suspicious mass is seen. No hydronephrosis. Bladder: Mass along the posterior bladder wall measures 2.1 cm, with internal vascularity, likely neoplastic. Bladder is otherwise unremarkable. No bladder stone is seen. Other: None. IMPRESSION: 1. Suspicious mass along the posterior bladder wall, measuring 2.1 cm, with internal blood flow, likely neoplastic. Consider cystoscopy for definitive characterization. 2. Mild RIGHT-sided hydronephrosis. 3. Bilateral renal cysts. No suspicious mass is seen within  either kidney. However, soft tissue density mass has been described within the lower pole of the RIGHT renal collecting system on previous CT examinations dating back to 2017, suspicious for low-grade urothelial carcinoma. 4. Small echogenic LEFT kidney, corresponding to appearance on earlier CT of 12/25/2018, likely related to the previously described saccular aneurysm of the abdominal aorta which originates at or immediately adjacent to the expected origin of the LEFT renal artery causing chronic renal insufficiency. Electronically Signed   By: Franki Cabot M.D.   On: 01/22/2020 08:54   DG OR UROLOGY CYSTO IMAGE ([place])  Result Date: 01/24/2020 There is no interpretation for this exam.  This order is for images obtained during a surgical procedure.  Please See "Surgeries" Tab for more information regarding the procedure.   CT Renal Stone Study  Result Date: 01/30/2020 CLINICAL DATA:  Flank pain, recent stent placement, recurrent AKI EXAM: CT ABDOMEN AND PELVIS WITHOUT CONTRAST TECHNIQUE: Multidetector CT imaging of the abdomen and pelvis was performed following the standard protocol without IV contrast. COMPARISON:  01/22/2020 FINDINGS: Lower chest: Increased small bilateral pleural effusions. Hepatobiliary: Stable appearance of the liver including probable cirrhotic morphology and probable perifissural focal fat. Punctate gallbladder calculus is no longer within the neck. No biliary dilatation. Pancreas: Unremarkable. Spleen: Unremarkable. Adrenals/Urinary Tract: Adrenals are unremarkable. Stable atrophic left kidney with several cysts. Multiple punctate calculi are again identified within the right lower pole collecting  system. Interval placement of double-J nephroureteral stent. Proximal pigtail is within the upper pole collecting system and distal pigtail is within the bladder. No definite ureteral calculus. Similar right hydronephrosis. Tumor within the collecting system is not well evaluated on  this study. Stomach/Bowel: Stomach is within normal limits. Bowel is normal in caliber. Sigmoid diverticulosis. Vascular/Lymphatic: Aortic atherosclerosis with aorta bi-iliac graft. 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 No enlarged lymph nodes identified. Reproductive: No pelvic mass. Other: Increased small volume free fluid in the pelvis. Postoperative changes of prior ventral hernia repair. There is a broad-based ventral hernia containing a short segment of mid transverse colon. Small fat containing inguinal hernias. Musculoskeletal: No acute osseous abnormality. IMPRESSION: Interval placement of right double-J nephroureteral stent. No definite ureteral calculus. Similar right hydronephrosis. Multiple punctate calculi remain present within the right lower pole collecting system. Tumor within the collecting system is not well evaluated on this study. Increased small volume free fluid primarily in the pelvis. Increased small bilateral pleural effusions. Stable findings of probable cirrhosis. Stable pseudoaneurysm of the abdominal aorta. Electronically Signed   By: Macy Mis M.D.   On: 01/30/2020 09:46   US RENAL ARTERY DUPLEX LIMITED  Result Date: 01/31/2020 CLINICAL DATA:  Acute kidney injury superimposed on chronic kidney disease. Right ureteral stent, history of right renal artery stent as well and chronic left renal atrophy EXAM: RENAL DUPLEX DOPPLER ULTRASOUND (limited) TECHNIQUE: Duplex and color Doppler ultrasound was utilized to evaluate blood flow in the renal arteries and kidneys. COMPARISON:  01/30/2020 FINDINGS: Right Renal Artery Velocities: Origin:  66/10 cm/sec Mid:  80/12 cm/sec Hilum:  65/11 cm/sec Interlobar:  53/6 cm/sec Arcuate: 23/5 cm/sec Left Renal Artery Velocities: Not obtained because of known chronic left renal atrophy compatible with chronic occlusive left renal vascular disease. Aortic Velocity: 44 cm/sec Right Renal-Aortic Ratios: Origin: 1.5  Mid:  1.8 Hilum: 1.5 Interlobar: 1.2 Arcuate: 0.5 Left Renal-Aortic Ratios: Not performed because of known chronic left renal atrophy compatible with chronic occlusive left renal vascular disease. Persistent moderate left hydronephrosis appearing chronic. Interval placement of a right ureteral stent visualized. Echogenic shadowing calculi within the left kidney lower pole noted similar to the prior CT. Several scattered anechoic right renal cysts. Advanced left renal atrophy with increased echogenicity and cortical thinning. Several left renal cysts also noted. No left hydronephrosis or acute finding. Included views of the bladder again demonstrate the ureteral stent within the bladder. By duplex, the right renal origin vascular stent appears patent. No significant velocity or ratio abnormality to suggest right renal artery stenosis by duplex. IMPRESSION: Patent right renal origin vascular stent. No abnormal velocity or ratio to suggest significant right renal artery stenosis by duplex. Other chronic findings as above. Electronically Signed   By: Jerilynn Mages.  Shick M.D.   On: 01/31/2020 08:33      Subjective: Pt c/o fatigue    Discharge Exam: Vitals:   02/02/20 1055 02/02/20 1111  BP:  136/70  Pulse:  69  Resp:  18  Temp:  97.6 F (36.4 C)  SpO2: 94% 95%   Vitals:   02/02/20 0244 02/02/20 0836 02/02/20 1055 02/02/20 1111  BP: (!) 126/55 (!) 150/70  136/70  Pulse: 70 69  69  Resp: 16 17  18   Temp: 98 F (36.7 C) 97.9 F (36.6 C)  97.6 F (36.4 C)  TempSrc: Oral Oral  Oral  SpO2: 94% 98% 94% 95%  Weight:      Height:  General: Pt is alert, awake, not in acute distress Cardiovascular: S1/S2 +, no rubs, no gallops Respiratory: CTA bilaterally, no wheezing, no rhonchi Abdominal: Soft, NT, ND, bowel sounds + Extremities: no cyanosis    The results of significant diagnostics from this hospitalization (including imaging, microbiology, ancillary and laboratory) are listed below for  reference.     Microbiology: Recent Results (from the past 240 hour(s))  Surgical PCR screen     Status: None   Collection Time: 01/24/20  7:47 AM   Specimen: Nasal Mucosa; Nasal Swab  Result Value Ref Range Status   MRSA, PCR NEGATIVE NEGATIVE Final   Staphylococcus aureus NEGATIVE NEGATIVE Final    Comment: (NOTE) The Xpert SA Assay (FDA approved for NASAL specimens in patients 30 years of age and older), is one component of a comprehensive surveillance program. It is not intended to diagnose infection nor to guide or monitor treatment. Performed at [facility], [address], [place], [place] [address]   Urine Culture     Status: None   Collection Time: 01/30/20  7:46 AM   Specimen: Urine, Random  Result Value Ref Range Status   Specimen Description   Final    URINE, RANDOM Performed at [facility], [address], [place], [place] [address]    Special Requests   Final    NONE Performed at [facility], [address], [place], [place] [address]    Culture   Final    NO GROWTH Performed at [facility], [place] [address] [place], [place] [address]    Report Status 01/31/2020 FINAL  Final  SARS CORONAVIRUS 2 (TAT 6-24 HRS) Nasopharyngeal     Status: None   Collection Time: 01/30/20  9:33 AM   Specimen: Nasopharyngeal  Result Value Ref Range Status   SARS Coronavirus 2 NEGATIVE NEGATIVE Final    Comment: (NOTE) SARS-CoV-2 target nucleic acids are NOT DETECTED.  The SARS-CoV-2 RNA is generally detectable in upper and lower respiratory specimens during the acute phase of infection. Negative results do not preclude SARS-CoV-2 infection, do not rule out co-infections with other pathogens, and should not be used as the sole basis for treatment or other patient management decisions. Negative results must be combined with clinical observations, patient history, and epidemiological information. The expected result is Negative.  Fact  Sheet for Patients: SugarRoll.be  Fact Sheet for Healthcare Providers: https://www.woods-mathews.com/  This test is not yet approved or cleared by the [place] FDA and  has been authorized for detection and/or diagnosis of SARS-CoV-2 by FDA under an Emergency Use Authorization (EUA). This EUA will remain  in effect (meaning this test can be used) for the duration of the COVID-19 declaration under Se ction 564(b)(1) of the Act, 21 U.S.C. section 360bbb-3(b)(1), unless the authorization is terminated or revoked sooner.  Performed at [facility], [place] [address] [place],  [address]      Labs: BNP (last 3 results) Recent Labs    01/21/20 1404 01/30/20 0746  BNP 499.5* 433.2*   Basic Metabolic Panel: Recent Labs  Lab 01/30/20 0746 01/31/20 0423 02/01/20 0525 02/02/20 0515  NA 137 141 140 138  K 3.3* 3.4* 3.6 3.2*  CL 104 110 107 104  CO2 22 21* 19* 19*  GLUCOSE 104* 84 71 111*  BUN 85* 93* 96* 100*  CREATININE 3.96* 3.96* 4.20* 4.51*  CALCIUM 8.7* 8.1* 8.2* 8.1*  MG 1.8  --   --   --  Liver Function Tests: Recent Labs  Lab 01/30/20 0746  AST 20  ALT 16  ALKPHOS 65  BILITOT 0.8  PROT 6.4*  ALBUMIN 3.6   No results for input(s): LIPASE, AMYLASE in the last 168 hours. No results for input(s): AMMONIA in the last 168 hours. CBC: Recent Labs  Lab 01/30/20 0746 01/31/20 0423 02/01/20 0525  WBC 12.2* 8.4 8.5  HGB 10.9* 10.5* 10.9*  HCT 32.9* 31.2* 33.0*  MCV 90.1 90.4 90.9  PLT 185 121* 122*   Cardiac Enzymes: No results for input(s): CKTOTAL, CKMB, CKMBINDEX, TROPONINI in the last 168 hours. BNP: Invalid input(s): POCBNP CBG: No results for input(s): GLUCAP in the last 168 hours. D-Dimer No results for input(s): DDIMER in the last 72 hours. Hgb A1c No results for input(s): HGBA1C in the last 72 hours. Lipid Profile No results for input(s): CHOL, HDL, LDLCALC, TRIG, CHOLHDL, LDLDIRECT in  the last 72 hours. Thyroid function studies No results for input(s): TSH, T4TOTAL, T3FREE, THYROIDAB in the last 72 hours.  Invalid input(s): FREET3 Anemia work up No results for input(s): VITAMINB12, FOLATE, FERRITIN, TIBC, IRON, RETICCTPCT in the last 72 hours. Urinalysis    Component Value Date/Time   COLORURINE STRAW (A) 01/30/2020 0746   APPEARANCEUR CLEAR (A) 01/30/2020 0746   APPEARANCEUR Cloudy (A) 01/19/2019 1143   LABSPEC 1.008 01/30/2020 0746   PHURINE 5.0 01/30/2020 0746   GLUCOSEU NEGATIVE 01/30/2020 0746   GLUCOSEU NEGATIVE 03/06/2017 0930   HGBUR LARGE (A) 01/30/2020 0746   BILIRUBINUR NEGATIVE 01/30/2020 0746   BILIRUBINUR Negative 01/19/2019 1143   KETONESUR NEGATIVE 01/30/2020 0746   PROTEINUR NEGATIVE 01/30/2020 0746   UROBILINOGEN 0.2 08/30/2017 1108   UROBILINOGEN 0.2 03/06/2017 0930   NITRITE NEGATIVE 01/30/2020 0746   LEUKOCYTESUR TRACE (A) 01/30/2020 0746   Sepsis Labs Invalid input(s): PROCALCITONIN,  WBC,  LACTICIDVEN Microbiology Recent Results (from the past 240 hour(s))  Surgical PCR screen     Status: None   Collection Time: 01/24/20  7:47 AM   Specimen: Nasal Mucosa; Nasal Swab  Result Value Ref Range Status   MRSA, PCR NEGATIVE NEGATIVE Final   Staphylococcus aureus NEGATIVE NEGATIVE Final    Comment: (NOTE) The Xpert SA Assay (FDA approved for NASAL specimens in patients 52 years of age and older), is one component of a comprehensive surveillance program. It is not intended to diagnose infection nor to guide or monitor treatment. Performed at [facility], [address], [place], [place] [address]   Urine Culture     Status: None   Collection Time: 01/30/20  7:46 AM   Specimen: Urine, Random  Result Value Ref Range Status   Specimen Description   Final    URINE, RANDOM Performed at [facility], [address], [place], [place] [address]    Special Requests   Final    NONE Performed at [facility],  [address], [place], [place] [address]    Culture   Final    NO GROWTH Performed at [facility], [place] [address] [place], [place] [address]    Report Status 01/31/2020 FINAL  Final  SARS CORONAVIRUS 2 (TAT 6-24 HRS) Nasopharyngeal     Status: None   Collection Time: 01/30/20  9:33 AM   Specimen: Nasopharyngeal  Result Value Ref Range Status   SARS Coronavirus 2 NEGATIVE NEGATIVE Final    Comment: (NOTE) SARS-CoV-2 target nucleic acids are NOT DETECTED.  The SARS-CoV-2 RNA is generally detectable in upper and lower respiratory  specimens during the acute phase of infection. Negative results do not preclude SARS-CoV-2 infection, do not rule out co-infections with other pathogens, and should not be used as the sole basis for treatment or other patient management decisions. Negative results must be combined with clinical observations, patient history, and epidemiological information. The expected result is Negative.  Fact Sheet for Patients: SugarRoll.be  Fact Sheet for Healthcare Providers: https://www.woods-mathews.com/  This test is not yet approved or cleared by the [place] FDA and  has been authorized for detection and/or diagnosis of SARS-CoV-2 by FDA under an Emergency Use Authorization (EUA). This EUA will remain  in effect (meaning this test can be used) for the duration of the COVID-19 declaration under Se ction 564(b)(1) of the Act, 21 U.S.C. section 360bbb-3(b)(1), unless the authorization is terminated or revoked sooner.  Performed at [facility], [place] [address] [place], [place] [address]      Time coordinating discharge: Over 30 minutes  SIGNED:   Wyvonnia Dusky, MD  [facility] 02/02/2020, 3:11 PM Pager   If 7PM-7AM, please contact night-coverage

## 2020-02-02 NOTE — Care Management Important Message (Signed)
Important Message  Patient Details  Name: Jessica [name] MRN: [id number] Date of Birth: Jul 15, 1929   Medicare Important Message Given:  Yes     Dannette Barbara 02/02/2020, 10:55 AM

## 2020-02-04 ENCOUNTER — Emergency Department: Payer: Medicare Other

## 2020-02-04 ENCOUNTER — Other Ambulatory Visit: Payer: Self-pay

## 2020-02-04 DIAGNOSIS — Z951 Presence of aortocoronary bypass graft: Secondary | ICD-10-CM | POA: Insufficient documentation

## 2020-02-04 DIAGNOSIS — I120 Hypertensive chronic kidney disease with stage 5 chronic kidney disease or end stage renal disease: Secondary | ICD-10-CM | POA: Diagnosis not present

## 2020-02-04 DIAGNOSIS — Z20822 Contact with and (suspected) exposure to covid-19: Secondary | ICD-10-CM | POA: Diagnosis not present

## 2020-02-04 DIAGNOSIS — Z87891 Personal history of nicotine dependence: Secondary | ICD-10-CM | POA: Diagnosis not present

## 2020-02-04 DIAGNOSIS — J441 Chronic obstructive pulmonary disease with (acute) exacerbation: Secondary | ICD-10-CM | POA: Insufficient documentation

## 2020-02-04 DIAGNOSIS — I129 Hypertensive chronic kidney disease with stage 1 through stage 4 chronic kidney disease, or unspecified chronic kidney disease: Secondary | ICD-10-CM | POA: Diagnosis not present

## 2020-02-04 DIAGNOSIS — Z950 Presence of cardiac pacemaker: Secondary | ICD-10-CM | POA: Insufficient documentation

## 2020-02-04 DIAGNOSIS — N186 End stage renal disease: Secondary | ICD-10-CM | POA: Diagnosis not present

## 2020-02-04 DIAGNOSIS — I499 Cardiac arrhythmia, unspecified: Secondary | ICD-10-CM | POA: Diagnosis not present

## 2020-02-04 DIAGNOSIS — Z7951 Long term (current) use of inhaled steroids: Secondary | ICD-10-CM | POA: Insufficient documentation

## 2020-02-04 DIAGNOSIS — R001 Bradycardia, unspecified: Secondary | ICD-10-CM | POA: Diagnosis not present

## 2020-02-04 DIAGNOSIS — Z9981 Dependence on supplemental oxygen: Secondary | ICD-10-CM | POA: Diagnosis not present

## 2020-02-04 DIAGNOSIS — R0602 Shortness of breath: Secondary | ICD-10-CM | POA: Diagnosis not present

## 2020-02-04 DIAGNOSIS — N1831 Chronic kidney disease, stage 3a: Secondary | ICD-10-CM | POA: Diagnosis not present

## 2020-02-04 DIAGNOSIS — Z483 Aftercare following surgery for neoplasm: Secondary | ICD-10-CM | POA: Diagnosis not present

## 2020-02-04 DIAGNOSIS — R0902 Hypoxemia: Secondary | ICD-10-CM | POA: Diagnosis not present

## 2020-02-04 DIAGNOSIS — J90 Pleural effusion, not elsewhere classified: Secondary | ICD-10-CM | POA: Diagnosis not present

## 2020-02-04 DIAGNOSIS — I714 Abdominal aortic aneurysm, without rupture: Secondary | ICD-10-CM | POA: Diagnosis not present

## 2020-02-04 DIAGNOSIS — M109 Gout, unspecified: Secondary | ICD-10-CM | POA: Diagnosis not present

## 2020-02-04 DIAGNOSIS — Z79899 Other long term (current) drug therapy: Secondary | ICD-10-CM | POA: Insufficient documentation

## 2020-02-04 DIAGNOSIS — C679 Malignant neoplasm of bladder, unspecified: Secondary | ICD-10-CM | POA: Diagnosis not present

## 2020-02-04 DIAGNOSIS — Z743 Need for continuous supervision: Secondary | ICD-10-CM | POA: Diagnosis not present

## 2020-02-04 DIAGNOSIS — I4891 Unspecified atrial fibrillation: Secondary | ICD-10-CM | POA: Diagnosis not present

## 2020-02-04 LAB — CBC
HCT: 32.2 % — ABNORMAL LOW (ref 36.0–46.0)
Hemoglobin: 11 g/dL — ABNORMAL LOW (ref 12.0–15.0)
MCH: 30.1 pg (ref 26.0–34.0)
MCHC: 34.2 g/dL (ref 30.0–36.0)
MCV: 88.2 fL (ref 80.0–100.0)
Platelets: 124 10*3/uL — ABNORMAL LOW (ref 150–400)
RBC: 3.65 MIL/uL — ABNORMAL LOW (ref 3.87–5.11)
RDW: 13.9 % (ref 11.5–15.5)
WBC: 11.7 10*3/uL — ABNORMAL HIGH (ref 4.0–10.5)
nRBC: 0 % (ref 0.0–0.2)

## 2020-02-04 LAB — COMPREHENSIVE METABOLIC PANEL
ALT: 16 U/L (ref 0–44)
AST: 22 U/L (ref 15–41)
Albumin: 3.5 g/dL (ref 3.5–5.0)
Alkaline Phosphatase: 73 U/L (ref 38–126)
Anion gap: 17 — ABNORMAL HIGH (ref 5–15)
BUN: 102 mg/dL — ABNORMAL HIGH (ref 8–23)
CO2: 20 mmol/L — ABNORMAL LOW (ref 22–32)
Calcium: 8.1 mg/dL — ABNORMAL LOW (ref 8.9–10.3)
Chloride: 103 mmol/L (ref 98–111)
Creatinine, Ser: 6.59 mg/dL — ABNORMAL HIGH (ref 0.44–1.00)
GFR, Estimated: 6 mL/min — ABNORMAL LOW (ref >60)
Glucose, Bld: 114 mg/dL — ABNORMAL HIGH (ref 70–99)
Potassium: 3.5 mmol/L (ref 3.5–5.1)
Sodium: 140 mmol/L (ref 135–145)
Total Bilirubin: 1 mg/dL (ref 0.3–1.2)
Total Protein: 6.5 g/dL (ref 6.5–8.1)

## 2020-02-04 LAB — TROPONIN I (HIGH SENSITIVITY)
Troponin I (High Sensitivity): 41 ng/L — ABNORMAL HIGH (ref <18)
Troponin I (High Sensitivity): 44 ng/L — ABNORMAL HIGH (ref <18)

## 2020-02-04 NOTE — ED Notes (Addendum)
First RN note:  Pt comes itno the ED via [facility] from [facility]/o [facility].  Pt states the [facility] x a couple weeks.  Denies any CP or abdominal pain.  Pt has a pacemaker in place.  Recently diagnosed with cancer but unknwon what kind. H/o COPD and wears O2 chronically at 2L and sating at 98%.

## 2020-02-04 NOTE — ED Triage Notes (Signed)
Pt brought in by [facility] from [facility] for co shob for a few days. Pt recently admitted for the same and was dx with copd. Pt with mild shob noted in triage, is on o2 at 2l per [place] at home. Denies any cough or fever, no co pain.

## 2020-02-05 ENCOUNTER — Emergency Department
Admission: EM | Admit: 2020-02-05 | Discharge: 2020-02-05 | Disposition: A | Discharge status: Home / Self Care | Payer: Medicare Other | Attending: Student in an Organized Health Care Education/Training Program | Admitting: Student in an Organized Health Care Education/Training Program

## 2020-02-05 ENCOUNTER — Ambulatory Visit: Payer: Medicare Other | Admitting: Adult Health

## 2020-02-05 DIAGNOSIS — R0602 Shortness of breath: Secondary | ICD-10-CM | POA: Diagnosis not present

## 2020-02-05 DIAGNOSIS — C680 Malignant neoplasm of urethra: Secondary | ICD-10-CM | POA: Diagnosis not present

## 2020-02-05 LAB — RESP PANEL BY RT-PCR (FLU A&B, COVID) ARPGX2
Influenza A by PCR: NEGATIVE
Influenza B by PCR: NEGATIVE
SARS Coronavirus 2 by RT PCR: NEGATIVE

## 2020-02-05 MED ORDER — IPRATROPIUM-ALBUTEROL 0.5-2.5 (3) MG/3ML IN SOLN
3.0000 mL | Freq: Once | RESPIRATORY_TRACT | Status: AC
  Administered 2020-02-05: 3 mL via RESPIRATORY_TRACT
  Filled 2020-02-05: qty 3

## 2020-02-05 MED ORDER — METHYLPREDNISOLONE SODIUM SUCC 125 MG IJ SOLR
125.0000 mg | Freq: Once | INTRAMUSCULAR | Status: AC
  Administered 2020-02-05: 125 mg via INTRAVENOUS
  Filled 2020-02-05: qty 2

## 2020-02-05 NOTE — ED Provider Notes (Signed)
[facility] Emergency Department Provider Note    Event Date/Time   First MD Initiated Contact with Patient 02/05/20 0150     (approximate)  I have reviewed the triage vital signs and the nursing notes.   HISTORY  Chief Complaint Shortness of Breath    HPI Jessica [name] is a 87 y.o. female [place] past medical history known end-stage renal disease recently been started on palliative care presents the ER for worsening shortness of breath does wear 2 L nasal cannula at home.  Feels like she is having worsening wheezing.  Denies any chest pain.  No measured fevers.  No cough.  Is currently on some prednisone.  Felt like she was getting some improvement with her ProAir but feels like it is getting worse again.    Past Medical History:  Diagnosis Date  . AAA (abdominal aortic aneurysm) ([place])   . Atrioventricular block, complete ([place])   . Cardiac pacemaker in situ   . COPD (chronic obstructive pulmonary disease) ([place])   . Gout   . Hernia   . Hyperlipidemia   . Hypertension   . Kidney stone   . Presence of permanent cardiac pacemaker   . Rectal bleeding    Family History  Problem Relation Age of Onset  . Heart disease Father   . Alcohol abuse Father   . Cancer Brother   . Other Mother 24       MVA   Past Surgical History:  Procedure Laterality Date  . ABDOMINAL AORTIC ANEURYSM REPAIR     son denies  . CARDIOVERSION N/A 02/06/2019   Procedure: CARDIOVERSION;  Surgeon: Minna Merritts, MD;  Location: [facility] ORS;  Service: Cardiovascular;  Laterality: N/A;  . CYSTOSCOPY/RETROGRADE/URETEROSCOPY/STONE EXTRACTION WITH BASKET Right 01/24/2020   Procedure: CYSTOSCOPY/RETROGRADE/URETEROSCOPY/STONE EXTRACTION WITH BASKETRIGHT STENT PLACEMENT;  Surgeon: Billey Co, MD;  Location: [facility] ORS;  Service: Urology;  Laterality: Right;  . CYSTOSCOPY/URETEROSCOPY/HOLMIUM LASER/STENT PLACEMENT Right 01/01/2019   Procedure: CYSTOSCOPY/URETEROSCOPY/HOLMIUM  LASER/STENT PLACEMENT;  Surgeon: Billey Co, MD;  Location: [facility] ORS;  Service: Urology;  Laterality: Right;  . HERNIA REPAIR    . ohter     growth on colon surgery  . PPM GENERATOR CHANGEOUT N/A 02/25/2017   Procedure: PPM GENERATOR CHANGEOUT;  Surgeon: Deboraha Sprang, MD;  Location: [place] [facility];  Service: Cardiovascular;  Laterality: N/A;  . RENAL ANGIOGRAPHY Right 12/03/2017   Procedure: RENAL ANGIOGRAPHY;  Surgeon: Katha Cabal, MD;  Location: [place] [facility];  Service: Cardiovascular;  Laterality: Right;  . TONSILLECTOMY    . TRANSURETHRAL RESECTION OF BLADDER TUMOR N/A 01/24/2020   Procedure: TRANSURETHRAL RESECTION OF BLADDER TUMOR (TURBT);  Surgeon: Billey Co, MD;  Location: [facility] ORS;  Service: Urology;  Laterality: N/A;   Patient Active Problem List   Diagnosis Date Noted  . Acute kidney injury superimposed on chronic kidney disease ([place]) 01/30/2020  . Hypoxia   . Acute renal failure superimposed on stage 3a chronic kidney disease ([place]) 01/21/2020  . Hypokalemia 01/21/2020  . Atypical chest pain 08/25/2019  . Urothelial carcinoma ([place]) 01/18/2019  . Nephrolithiasis 01/18/2019  . Urinary tract infection symptoms 12/31/2018  . Bleeding hemorrhoids 02/27/2018  . Weight loss 08/30/2017  . Low back pain 08/30/2017  . Depression, major, single episode, mild ([place]) 05/20/2017  . Fall 05/20/2017  . Hypoglycemia 04/04/2017  . Gout 06/05/2016  . AAA (abdominal aortic aneurysm) without rupture ([place]) 04/18/2016  . Loss of sense of smell 12/30/2015  .  Memory difficulties 12/30/2015  . Injury of right lower extremity and cellulitis 11/29/2014  . 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 Renal artery stenosis ([place]) 09/15/2014  . 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 CN (constipation) 09/06/2014  . Change in stool caliber 09/06/2014  . Atherosclerosis of aorto-iliac bypass graft ([place]) 08/17/2014  . Shortness of breath 05/12/2014  . Conjunctival  hemorrhage of right eye 08/13/2013  . Medicare annual wellness visit, subsequent 04/22/2013  . Allergic rhinitis 11/26/2012  . COPD exacerbation ([place]) 09/27/2011  . Hyperlipidemia 02/22/2011  . Long term current use of anticoagulant 04/12/2010  . HYPERTENSION, BENIGN 08/25/2009  . Atrial fibrillation ([place]) 03/07/2009  . Carotid stenosis 08/24/2008  . AV BLOCK, COMPLETE 06/17/2008  . Aneurysm of abdominal vessel ([place]) 06/17/2008  . PACEMAKER-[place] 06/17/2008      Prior to Admission medications   Medication Sig Start Date End Date Taking? Authorizing Provider  acetaminophen (TYLENOL) 500 MG tablet Take 500 mg by mouth every 6 (six) hours as needed for moderate pain or headache.    [provider]  albuterol (PROAIR HFA) 108 (90 Base) MCG/ACT inhaler Inhale 2 puffs into the lungs every 6 (six) hours as needed for wheezing or shortness of breath. 02/24/18   Jodelle Green, FNP  ALLERGY RELIEF 10 MG tablet Take 10 mg by mouth daily. 01/07/20   [provider]  amLODipine (NORVASC) 5 MG tablet Take 1 tablet (5 mg total) by mouth daily. Please call to schedule office visit for further refills. 01/30/19   Minna Merritts, MD  atenolol (TENORMIN) 25 MG tablet Take 1 tablet (25 mg total) by mouth daily. Please call to schedule office visit for further refills. 01/30/19   Minna Merritts, MD  cholecalciferol 25 MCG (1000 UT) TABS Take 1,000 Units by mouth daily.    [provider]  dextromethorphan-guaiFENesin (MUCINEX DM) 30-600 MG 12hr tablet Take 1 tablet by mouth 2 (two) times daily as needed for cough. 01/26/20   Fritzi Mandes, MD  Fluticasone-Salmeterol (ADVAIR DISKUS) 250-50 MCG/DOSE AEPB Inhale 1 puff into the lungs in the morning and at bedtime. 08/25/19   Leone Haven, MD  furosemide (LASIX) 20 MG tablet Take 1 tablet (20 mg total) by mouth as needed (for shortness of breath/edema). 07/28/19 12/30/19  Loel Dubonnet, NP  melatonin 3 MG TABS tablet Take 1 tablet (3  mg) by mouth once daily at bedtime as needed for sleep    [provider]  oxybutynin (DITROPAN-XL) 5 MG 24 hr tablet Take 1 tablet (5 mg total) by mouth at bedtime. 02/02/20 03/03/20  Wyvonnia Dusky, MD  rosuvastatin (CRESTOR) 5 MG tablet Take 1 tablet (5 mg total) by mouth daily. 12/31/19   Loel Dubonnet, NP  senna-docusate (SENOKOT-S) 8.6-50 MG tablet Take 2 tablets by mouth once daily at bedtime as needed for constipation    [provider]  tamsulosin (FLOMAX) 0.4 MG CAPS capsule Take 1 capsule (0.4 mg total) by mouth daily. 12/25/18   Harvest Dark, MD  vitamin B-12 (CYANOCOBALAMIN) 1000 MCG tablet Take 1 tablet (1000 mg) by mouth once daily at bedtime    [provider]  citalopram (CELEXA) 10 MG tablet Take 1 tablet (10 mg total) by mouth daily. 10/08/18 11/12/18  Jodelle Green, FNP    Allergies Codeine, Morphine, and Zyrtec [cetirizine]    Social History Social History   Tobacco Use  . Smoking status: Former Smoker    Packs/day: 1.50    Years:  50.00    Pack years: 75.00    Types: Cigarettes    Quit date: 10/03/1995    Years since quitting: 24.3  . Smokeless tobacco: Never Used  Vaping Use  . Vaping Use: Never used  Substance Use Topics  . Alcohol use: Not Currently  . Drug use: No    Review of Systems Patient denies headaches, rhinorrhea, blurry vision, numbness, shortness of breath, chest pain, edema, cough, abdominal pain, nausea, vomiting, diarrhea, dysuria, fevers, rashes or hallucinations unless otherwise stated above in HPI. ____________________________________________   PHYSICAL EXAM:  VITAL SIGNS: Vitals:   02/04/20 2219 02/05/20 0259  BP: (!) 121/104 (!) 147/70  Pulse: 70 70  Resp: 20 18  Temp: 99 F (37.2 C)   SpO2: 97% 98%    Constitutional: Alert and oriented.  Eyes: Conjunctivae are normal.  Head: Atraumatic. Nose: No congestion/rhinnorhea. Mouth/Throat: Mucous membranes are moist.   Neck: No stridor.  Painless ROM.  Cardiovascular: Normal rate, regular rhythm. Grossly normal heart sounds.  Good peripheral circulation. Respiratory: mild tachypnea with diffuse expiratory wheeze throughout. Gastrointestinal: Soft and nontender. No distention. No abdominal bruits. No CVA tenderness. Genitourinary:  Musculoskeletal: No lower extremity tenderness nor edema.  No joint effusions. Neurologic:  Normal speech and language. No gross focal neurologic deficits are appreciated. No facial droop Skin:  Skin is warm, dry and intact. No rash noted. Psychiatric: Mood and affect are normal. Speech and behavior are normal.  ____________________________________________   LABS (all labs ordered are listed, but only abnormal results are displayed)  Results for orders placed or performed during the hospital encounter of 02/05/20 (from the past 24 hour(s))  CBC     Status: Abnormal   Collection Time: 02/04/20  7:12 PM  Result Value Ref Range   WBC 11.7 (H) 4.0 - 10.5 K/uL   RBC 3.65 (L) 3.87 - 5.11 MIL/uL   Hemoglobin 11.0 (L) 12.0 - 15.0 g/dL   HCT 32.2 (L) 36.0 - 46.0 %   MCV 88.2 80.0 - 100.0 fL   MCH 30.1 26.0 - 34.0 pg   MCHC 34.2 30.0 - 36.0 g/dL   RDW 13.9 11.5 - 15.5 %   Platelets 124 (L) 150 - 400 K/uL   nRBC 0.0 0.0 - 0.2 %  Comprehensive metabolic panel     Status: Abnormal   Collection Time: 02/04/20  7:12 PM  Result Value Ref Range   Sodium 140 135 - 145 mmol/L   Potassium 3.5 3.5 - 5.1 mmol/L   Chloride 103 98 - 111 mmol/L   CO2 20 (L) 22 - 32 mmol/L   Glucose, Bld 114 (H) 70 - 99 mg/dL   BUN 102 (H) 8 - 23 mg/dL   Creatinine, Ser 6.59 (H) 0.44 - 1.00 mg/dL   Calcium 8.1 (L) 8.9 - 10.3 mg/dL   Total Protein 6.5 6.5 - 8.1 g/dL   Albumin 3.5 3.5 - 5.0 g/dL   AST 22 15 - 41 U/L   ALT 16 0 - 44 U/L   Alkaline Phosphatase 73 38 - 126 U/L   Total Bilirubin 1.0 0.3 - 1.2 mg/dL   GFR, Estimated 6 (L) >60 mL/min   Anion gap 17 (H) 5 - 15  Troponin I (High Sensitivity)     Status:  Abnormal   Collection Time: 02/04/20  7:12 PM  Result Value Ref Range   Troponin I (High Sensitivity) 41 (H) <18 ng/L  Troponin I (High Sensitivity)     Status: Abnormal   Collection Time: 02/04/20  10:26 PM  Result Value Ref Range   Troponin I (High Sensitivity) 44 (H) <18 ng/L  Resp Panel by RT-PCR (Flu A&B, Covid) Nasopharyngeal Swab     Status: None   Collection Time: 02/05/20  3:04 AM   Specimen: Nasopharyngeal Swab; Nasopharyngeal(NP) swabs in vial transport medium  Result Value Ref Range   SARS Coronavirus 2 by RT PCR NEGATIVE NEGATIVE   Influenza A by PCR NEGATIVE NEGATIVE   Influenza B by PCR NEGATIVE NEGATIVE   ____________________________________________  EKG My review and personal interpretation at Time: 19:09   Indication: sob  Rate: 70  Rhythm: v-paced Axis: normal Other: paced rhythm, abnml ekg ____________________________________________  RADIOLOGY  I personally reviewed all radiographic images ordered to evaluate for the above acute complaints and reviewed radiology reports and findings.  These findings were personally discussed with the patient.  Please see medical record for radiology report.  ____________________________________________   PROCEDURES  Procedure(s) performed:  Procedures    Critical Care performed: no ____________________________________________   INITIAL IMPRESSION / ASSESSMENT AND PLAN / ED COURSE  Pertinent labs & imaging results that were available during my care of the patient were reviewed by me and considered in my medical decision making (see chart for details).   DDX: Asthma, copd, CHF, pna, ptx, malignancy, Pe, anemia   MARDIE KELLEN is a 87 y.o. who presents to the ED with presentation as described above.  Patient nontoxic-appearing.  Does have known COPD no new hypoxia.  Has some mild wheezing on exam we will give neb as well as steroid and reassess.  Have a lower suspicion for PE or CHF.  Does appear to have worsening  renal function blood work.  The patient will be placed on continuous pulse oximetry and telemetry for monitoring.  Laboratory evaluation will be sent to evaluate for the above complaints.     Clinical Course as of 02/05/20 0516  Fri Feb 05, 2020  3710 Patient reassessed.  Feels significantly improved after her breathing treatment and steroid.  She is not showing any signs of new or worsening hypoxia.  Discussed recommendation for hospitalization for her breathing as well as worsening renal function in order patient has declined HD or any further treatment her renal dysfunction and cancer therefore wants to talk to her son about whether she would come in the hospital or prefer to have outpatient follow-up. [PR]  [phone] Patient's son at bedside and after further discussion plan is to take patient back to [facility].  She understands that she is in end-stage renal disease but does not want to pursue dialysis or treatment of her known bladder cancer.  She is not feeling short of breath right now.  Son thinks she might of been having a panic attack earlier.  Have recommended that she continue with her steroid as I do suspect a mild component of COPD but she is not hypoxic.  She is not showing signs of worsening volume overload no worsening effusions or edema on chest x-ray.  This does not seem consistent with PE given lack of hypoxia or chest discomfort.  States that she would like to follow-up with palliative care as an outpatient which I think is reasonable.  We discussed signs and symptoms which they should return to the ER. [PR]    Clinical Course User Index [PR] Merlyn Lot, MD    The patient was evaluated in Emergency Department today for the symptoms described in the history of present illness. He/she was evaluated in the  context of the global COVID-19 pandemic, which necessitated consideration that the patient might be at risk for infection with the SARS-CoV-2 virus that causes COVID-19.  Institutional protocols and algorithms that pertain to the evaluation of patients at risk for COVID-19 are in a state of rapid change based on information released by regulatory bodies including the CDC and federal and state organizations. These policies and algorithms were followed during the patient's care in the ED.  As part of my medical decision making, I reviewed the following data within the [place] notes reviewed and incorporated, Labs reviewed, notes from prior ED visits and [place] Controlled Substance Database   ____________________________________________   FINAL CLINICAL IMPRESSION(S) / ED DIAGNOSES  Final diagnoses:  Shortness of breath      NEW MEDICATIONS STARTED DURING THIS VISIT:  New Prescriptions   No medications on file     Note:  This document was prepared using Dragon voice recognition software and may include unintentional dictation errors.    Merlyn Lot, MD 02/05/20 [phone]

## 2020-02-05 NOTE — Discharge Instructions (Addendum)
Please follow up with PCP and palliative care specialist.  Return to the ED for any additional questions, or concerns.

## 2020-02-09 ENCOUNTER — Telehealth: Payer: Self-pay | Admitting: Urology

## 2020-02-09 NOTE — Telephone Encounter (Signed)
Patient's son called to let you know that they had to call in hospice so he has cancelled all of future appts  Sharyn Lull

## 2020-02-10 ENCOUNTER — Ambulatory Visit: Payer: Medicare Other | Admitting: Urology

## 2020-02-11 DIAGNOSIS — I119 Hypertensive heart disease without heart failure: Secondary | ICD-10-CM | POA: Diagnosis not present

## 2020-02-11 DIAGNOSIS — R413 Other amnesia: Secondary | ICD-10-CM | POA: Diagnosis not present

## 2020-02-11 DIAGNOSIS — I4891 Unspecified atrial fibrillation: Secondary | ICD-10-CM | POA: Diagnosis not present

## 2020-02-11 DIAGNOSIS — E785 Hyperlipidemia, unspecified: Secondary | ICD-10-CM | POA: Diagnosis not present

## 2020-02-11 DIAGNOSIS — J449 Chronic obstructive pulmonary disease, unspecified: Secondary | ICD-10-CM | POA: Diagnosis not present

## 2020-02-12 ENCOUNTER — Emergency Department
Admission: EM | Admit: 2020-02-12 | Discharge: 2020-02-12 | Disposition: A | Discharge status: Home / Self Care | Attending: Emergency Medicine | Admitting: Emergency Medicine

## 2020-02-12 ENCOUNTER — Emergency Department

## 2020-02-12 ENCOUNTER — Other Ambulatory Visit: Payer: Self-pay

## 2020-02-12 ENCOUNTER — Encounter: Payer: Self-pay | Admitting: Emergency Medicine

## 2020-02-12 DIAGNOSIS — I129 Hypertensive chronic kidney disease with stage 1 through stage 4 chronic kidney disease, or unspecified chronic kidney disease: Secondary | ICD-10-CM | POA: Insufficient documentation

## 2020-02-12 DIAGNOSIS — N1831 Chronic kidney disease, stage 3a: Secondary | ICD-10-CM | POA: Insufficient documentation

## 2020-02-12 DIAGNOSIS — Z043 Encounter for examination and observation following other accident: Secondary | ICD-10-CM | POA: Diagnosis not present

## 2020-02-12 DIAGNOSIS — J449 Chronic obstructive pulmonary disease, unspecified: Secondary | ICD-10-CM | POA: Diagnosis not present

## 2020-02-12 DIAGNOSIS — Z743 Need for continuous supervision: Secondary | ICD-10-CM | POA: Diagnosis not present

## 2020-02-12 DIAGNOSIS — Z23 Encounter for immunization: Secondary | ICD-10-CM | POA: Insufficient documentation

## 2020-02-12 DIAGNOSIS — Z8551 Personal history of malignant neoplasm of bladder: Secondary | ICD-10-CM | POA: Diagnosis not present

## 2020-02-12 DIAGNOSIS — Z79899 Other long term (current) drug therapy: Secondary | ICD-10-CM | POA: Diagnosis not present

## 2020-02-12 DIAGNOSIS — Z950 Presence of cardiac pacemaker: Secondary | ICD-10-CM | POA: Insufficient documentation

## 2020-02-12 DIAGNOSIS — S0181XA Laceration without foreign body of other part of head, initial encounter: Secondary | ICD-10-CM | POA: Insufficient documentation

## 2020-02-12 DIAGNOSIS — S0003XA Contusion of scalp, initial encounter: Secondary | ICD-10-CM | POA: Diagnosis not present

## 2020-02-12 DIAGNOSIS — Y92129 Unspecified place in nursing home as the place of occurrence of the external cause: Secondary | ICD-10-CM | POA: Diagnosis not present

## 2020-02-12 DIAGNOSIS — Z87891 Personal history of nicotine dependence: Secondary | ICD-10-CM | POA: Insufficient documentation

## 2020-02-12 DIAGNOSIS — I10 Essential (primary) hypertension: Secondary | ICD-10-CM | POA: Diagnosis not present

## 2020-02-12 DIAGNOSIS — R0689 Other abnormalities of breathing: Secondary | ICD-10-CM | POA: Diagnosis not present

## 2020-02-12 DIAGNOSIS — R6889 Other general symptoms and signs: Secondary | ICD-10-CM | POA: Diagnosis not present

## 2020-02-12 DIAGNOSIS — Z7951 Long term (current) use of inhaled steroids: Secondary | ICD-10-CM | POA: Diagnosis not present

## 2020-02-12 DIAGNOSIS — W19XXXA Unspecified fall, initial encounter: Secondary | ICD-10-CM | POA: Insufficient documentation

## 2020-02-12 DIAGNOSIS — S0990XA Unspecified injury of head, initial encounter: Secondary | ICD-10-CM | POA: Diagnosis present

## 2020-02-12 LAB — COMPREHENSIVE METABOLIC PANEL
ALT: 20 U/L (ref 0–44)
AST: 17 U/L (ref 15–41)
Albumin: 3.3 g/dL — ABNORMAL LOW (ref 3.5–5.0)
Alkaline Phosphatase: 69 U/L (ref 38–126)
Anion gap: 15 (ref 5–15)
BUN: 139 mg/dL — ABNORMAL HIGH (ref 8–23)
CO2: 16 mmol/L — ABNORMAL LOW (ref 22–32)
Calcium: 8.3 mg/dL — ABNORMAL LOW (ref 8.9–10.3)
Chloride: 106 mmol/L (ref 98–111)
Creatinine, Ser: 7.85 mg/dL — ABNORMAL HIGH (ref 0.44–1.00)
GFR, Estimated: 5 mL/min — ABNORMAL LOW (ref >60)
Glucose, Bld: 128 mg/dL — ABNORMAL HIGH (ref 70–99)
Potassium: 3.8 mmol/L (ref 3.5–5.1)
Sodium: 137 mmol/L (ref 135–145)
Total Bilirubin: 0.9 mg/dL (ref 0.3–1.2)
Total Protein: 6.2 g/dL — ABNORMAL LOW (ref 6.5–8.1)

## 2020-02-12 LAB — CBC
HCT: 28.9 % — ABNORMAL LOW (ref 36.0–46.0)
Hemoglobin: 9.6 g/dL — ABNORMAL LOW (ref 12.0–15.0)
MCH: 30.1 pg (ref 26.0–34.0)
MCHC: 33.2 g/dL (ref 30.0–36.0)
MCV: 90.6 fL (ref 80.0–100.0)
Platelets: 111 10*3/uL — ABNORMAL LOW (ref 150–400)
RBC: 3.19 MIL/uL — ABNORMAL LOW (ref 3.87–5.11)
RDW: 14.3 % (ref 11.5–15.5)
WBC: 9.4 10*3/uL (ref 4.0–10.5)
nRBC: 0 % (ref 0.0–0.2)

## 2020-02-12 MED ORDER — IPRATROPIUM-ALBUTEROL 0.5-2.5 (3) MG/3ML IN SOLN
3.0000 mL | Freq: Once | RESPIRATORY_TRACT | Status: AC
  Administered 2020-02-12: 3 mL via RESPIRATORY_TRACT
  Filled 2020-02-12: qty 3

## 2020-02-12 MED ORDER — TETANUS-DIPHTH-ACELL PERTUSSIS 5-2.5-18.5 LF-MCG/0.5 IM SUSY
0.5000 mL | PREFILLED_SYRINGE | Freq: Once | INTRAMUSCULAR | Status: AC
  Administered 2020-02-12: 0.5 mL via INTRAMUSCULAR
  Filled 2020-02-12: qty 0.5

## 2020-02-12 NOTE — ED Notes (Signed)
Patient transported to X-ray 

## 2020-02-12 NOTE — Discharge Instructions (Addendum)

## 2020-02-12 NOTE — ED Triage Notes (Signed)
Pt to ED via EMS from [facility] c/o unwitnessed fall tonight.  Lying on her right side on floor upon EMS arrival.  Pt unsure why she fell, the fall was heard by another resident.  Pt has small laceration above left eye, head covered and matted with blood, no other lacerations to be found at this time.  Multiple skin tears to bilateral legs and arms.  Pt reported headache on scene but denies any pain upon arrival.  EMS vitals 70 HR, 84% RA and placed on 3L [place] at 99%, 116/72 BP, 26 RR.    Pt presents alert and oriented to self and the fall, disoriented to time.  Laceration to left eye skin glued by Dr. Alfred Levins.  Pt has labored breathing but states is normal.

## 2020-02-12 NOTE — ED Notes (Signed)
Pt's family verbalizes d/c instructions with plans to return her to [facility]. No further questions at this time. This RN attempted to contact [facility] multiple times since 7am with no answer.

## 2020-02-12 NOTE — ED Notes (Signed)
Not necessary to I&O cath patient for urine d/t renal disease per Dr. Alfred Levins.

## 2020-02-12 NOTE — ED Provider Notes (Signed)
[facility] Emergency Department Provider Note  ____________________________________________  Time seen: Approximately 5:24 AM  I have reviewed the triage vital signs and the nursing notes.   HISTORY  Chief Complaint Fall  Level 5 caveat:  Portions of the history and physical were unable to be obtained due to dementia   HPI Jessica [name] is a 87 y.o. female with history of ESRD not on dialysis, COPD on 3L , hypertension, hyperlipidemia who presents for evaluation of a fall from her nursing home.  According to EMS, another resident heard patient fall and call for help with.  Patient is very confused which seems to be her baseline.  She has a lot of blood in her scalp from a laceration on the left forehead which is the site of a large hematoma.  Patient denies back pain, chest pain, and extremity pain, headache.   Past Medical History:  Diagnosis Date  . AAA (abdominal aortic aneurysm) ([place])   . Atrioventricular block, complete ([place])   . Cardiac pacemaker in situ   . COPD (chronic obstructive pulmonary disease) ([place])   . Gout   . Hernia   . Hyperlipidemia   . Hypertension   . Kidney stone   . Presence of permanent cardiac pacemaker   . Rectal bleeding     Patient Active Problem List   Diagnosis Date Noted  . Acute kidney injury superimposed on chronic kidney disease ([place]) 01/30/2020  . Hypoxia   . Acute renal failure superimposed on stage 3a chronic kidney disease ([place]) 01/21/2020  . Hypokalemia 01/21/2020  . Atypical chest pain 08/25/2019  . 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 Nephrolithiasis 01/18/2019  . Urinary tract infection symptoms 12/31/2018  . Bleeding hemorrhoids 02/27/2018  . Weight loss 08/30/2017  . Low back pain 08/30/2017  . Depression, major, single episode, mild ([place]) 05/20/2017  . Fall 05/20/2017  . Hypoglycemia 04/04/2017  . Gout 06/05/2016  . AAA (abdominal aortic aneurysm) without rupture ([place]) 04/18/2016  .  Loss of sense of smell 12/30/2015  . Memory difficulties 12/30/2015  . Injury of right lower extremity and cellulitis 11/29/2014  . 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 Renal artery stenosis ([place]) 09/15/2014  . 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 CN (constipation) 09/06/2014  . Change in stool caliber 09/06/2014  . Atherosclerosis of aorto-iliac bypass graft ([place]) 08/17/2014  . Shortness of breath 05/12/2014  . Conjunctival hemorrhage of right eye 08/13/2013  . Medicare annual wellness visit, subsequent 04/22/2013  . Allergic rhinitis 11/26/2012  . COPD exacerbation ([place]) 09/27/2011  . Hyperlipidemia 02/22/2011  . Long term current use of anticoagulant 04/12/2010  . HYPERTENSION, BENIGN 08/25/2009  . Atrial fibrillation ([place]) 03/07/2009  . Carotid stenosis 08/24/2008  . AV BLOCK, COMPLETE 06/17/2008  . Aneurysm of abdominal vessel ([place]) 06/17/2008  . PACEMAKER-[place] 06/17/2008    Past Surgical History:  Procedure Laterality Date  . ABDOMINAL AORTIC ANEURYSM REPAIR     son denies  . CARDIOVERSION N/A 02/06/2019   Procedure: CARDIOVERSION;  Surgeon: Minna Merritts, MD;  Location: [facility] ORS;  Service: Cardiovascular;  Laterality: N/A;  . CYSTOSCOPY/RETROGRADE/URETEROSCOPY/STONE EXTRACTION WITH BASKET Right 01/24/2020   Procedure: CYSTOSCOPY/RETROGRADE/URETEROSCOPY/STONE EXTRACTION WITH BASKETRIGHT STENT PLACEMENT;  Surgeon: Billey Co, MD;  Location: [facility] ORS;  Service: Urology;  Laterality: Right;  . CYSTOSCOPY/URETEROSCOPY/HOLMIUM LASER/STENT PLACEMENT Right 01/01/2019   Procedure: CYSTOSCOPY/URETEROSCOPY/HOLMIUM LASER/STENT PLACEMENT;  Surgeon: Billey Co, MD;  Location: [facility] ORS;  Service: Urology;  Laterality: Right;  . HERNIA REPAIR    .  ohter     growth on colon surgery  . PPM GENERATOR CHANGEOUT N/A 02/25/2017   Procedure: PPM GENERATOR CHANGEOUT;  Surgeon: Deboraha Sprang, MD;  Location: [place] [facility];  Service: Cardiovascular;  Laterality:  N/A;  . RENAL ANGIOGRAPHY Right 12/03/2017   Procedure: RENAL ANGIOGRAPHY;  Surgeon: Katha Cabal, MD;  Location: [place] [facility];  Service: Cardiovascular;  Laterality: Right;  . TONSILLECTOMY    . TRANSURETHRAL RESECTION OF BLADDER TUMOR N/A 01/24/2020   Procedure: TRANSURETHRAL RESECTION OF BLADDER TUMOR (TURBT);  Surgeon: Billey Co, MD;  Location: [facility] ORS;  Service: Urology;  Laterality: N/A;    Prior to Admission medications   Medication Sig Start Date End Date Taking? Authorizing Provider  acetaminophen (TYLENOL) 500 MG tablet Take 500 mg by mouth every 6 (six) hours as needed for moderate pain or headache.    [provider]  albuterol (PROAIR HFA) 108 (90 Base) MCG/ACT inhaler Inhale 2 puffs into the lungs every 6 (six) hours as needed for wheezing or shortness of breath. 02/24/18   Jodelle Green, FNP  ALLERGY RELIEF 10 MG tablet Take 10 mg by mouth daily. 01/07/20   [provider]  amLODipine (NORVASC) 5 MG tablet Take 1 tablet (5 mg total) by mouth daily. Please call to schedule office visit for further refills. 01/30/19   Minna Merritts, MD  atenolol (TENORMIN) 25 MG tablet Take 1 tablet (25 mg total) by mouth daily. Please call to schedule office visit for further refills. 01/30/19   Minna Merritts, MD  cholecalciferol 25 MCG (1000 UT) TABS Take 1,000 Units by mouth daily.    [provider]  dextromethorphan-guaiFENesin (MUCINEX DM) 30-600 MG 12hr tablet Take 1 tablet by mouth 2 (two) times daily as needed for cough. 01/26/20   Fritzi Mandes, MD  Fluticasone-Salmeterol (ADVAIR DISKUS) 250-50 MCG/DOSE AEPB Inhale 1 puff into the lungs in the morning and at bedtime. 08/25/19   Leone Haven, MD  furosemide (LASIX) 20 MG tablet Take 1 tablet (20 mg total) by mouth as needed (for shortness of breath/edema). 07/28/19 12/30/19  Loel Dubonnet, NP  melatonin 3 MG TABS tablet Take 1 tablet (3 mg) by mouth once daily at bedtime as needed for sleep     [provider]  oxybutynin (DITROPAN-XL) 5 MG 24 hr tablet Take 1 tablet (5 mg total) by mouth at bedtime. 02/02/20 03/03/20  Wyvonnia Dusky, MD  rosuvastatin (CRESTOR) 5 MG tablet Take 1 tablet (5 mg total) by mouth daily. 12/31/19   Loel Dubonnet, NP  senna-docusate (SENOKOT-S) 8.6-50 MG tablet Take 2 tablets by mouth once daily at bedtime as needed for constipation    [provider]  tamsulosin (FLOMAX) 0.4 MG CAPS capsule Take 1 capsule (0.4 mg total) by mouth daily. 12/25/18   Harvest Dark, MD  vitamin B-12 (CYANOCOBALAMIN) 1000 MCG tablet Take 1 tablet (1000 mg) by mouth once daily at bedtime    [provider]  citalopram (CELEXA) 10 MG tablet Take 1 tablet (10 mg total) by mouth daily. 10/08/18 11/12/18  Jodelle Green, FNP    Allergies Codeine, Morphine, and Zyrtec [cetirizine]  Family History  Problem Relation Age of Onset  . Heart disease Father   . Alcohol abuse Father   . Cancer Brother   . Other Mother 35       MVA    Social History Social History   Tobacco Use  . Smoking status: Former Smoker  Packs/day: 1.50    Years: 50.00    Pack years: 75.00    Types: Cigarettes    Quit date: 10/03/1995    Years since quitting: 24.3  . Smokeless tobacco: Never Used  Vaping Use  . Vaping Use: Never used  Substance Use Topics  . Alcohol use: Not Currently  . Drug use: No    Review of Systems  Constitutional: Negative for fever. Eyes: Negative for visual changes. ENT: + facial injury. No neck injury Cardiovascular: Negative for chest injury. Respiratory: Negative for shortness of breath. Negative for chest wall injury. Gastrointestinal: Negative for abdominal pain or injury. Genitourinary: Negative for dysuria. Musculoskeletal: Negative for back injury, negative for arm or leg pain. Skin: + forehead laceration Neurological: + head injury.   ____________________________________________   PHYSICAL EXAM:  VITAL SIGNS: ED  Triage Vitals  Enc Vitals Group     BP 02/12/20 0215 (!) 144/68     Pulse Rate 02/12/20 0215 70     Resp 02/12/20 0215 (!) 24     Temp --      Temp src --      SpO2 02/12/20 0215 91 %     Weight 02/12/20 0216 127 lb 13.9 oz (58 kg)     Height 02/12/20 0216 5\' 7"  (1.702 m)     Head Circumference --      Peak Flow --      Pain Score 02/12/20 0216 0     Pain Loc --      Pain Edu? --      Excl. in [place]? --     Full spinal precautions maintained throughout the trauma exam. Constitutional: Alert and oriented to self only. Mild respiratory distress. Does not appear intoxicated. HEENT Head: Normocephalic with a L forehead hematoma and a small superficial laceration Face: No facial bony tenderness. Stable midface Ears: No hemotympanum bilaterally. No Battle sign Eyes: No eye injury. PERRL. No raccoon eyes Nose: Nontender. No epistaxis. No rhinorrhea Mouth/Throat: Mucous membranes are moist. No oropharyngeal blood. No dental injury. Airway patent without stridor. Normal voice. Neck: no C-collar. No midline c-spine tenderness.  Cardiovascular: Normal rate, regular rhythm. Normal and symmetric distal pulses are present in all extremities. Pulmonary/Chest: Chest wall is atraumatic.  Patient with increased work of breathing, tachypneic, hypoxic, with wheezing bilaterally Abdominal: Soft, nontender, non distended. Musculoskeletal: Nontender with normal full range of motion in all extremities. No deformities. No thoracic or lumbar midline spinal tenderness. Pelvis is stable. Skin: Skin is warm, dry and intact. Several bruises and skin tears with different stages of healing Psychiatric: Speech and behavior are appropriate. Neurological: Normal speech and language. Moves all extremities to command. No gross focal neurologic deficits are appreciated.  Glascow Coma Score: 4 - Opens eyes on own 6 - Follows simple motor commands 4 - Seems confused, disoriented GCS:  14   ____________________________________________   LABS (all labs ordered are listed, but only abnormal results are displayed)  Labs Reviewed  CBC - Abnormal; Notable for the following components:      Result Value   RBC 3.19 (*)    Hemoglobin 9.6 (*)    HCT 28.9 (*)    Platelets 111 (*)    All other components within normal limits  COMPREHENSIVE METABOLIC PANEL - Abnormal; Notable for the following components:   CO2 16 (*)    Glucose, Bld 128 (*)    BUN 139 (*)    Creatinine, Ser 7.85 (*)    Calcium 8.3 (*)  Total Protein 6.2 (*)    Albumin 3.3 (*)    GFR, Estimated 5 (*)    All other components within normal limits  URINALYSIS, COMPLETE (UACMP) WITH MICROSCOPIC   ____________________________________________  EKG  ED ECG REPORT I, Rudene Re, the attending physician, personally viewed and interpreted this ECG.  Ventricular paced rhythm with no ST elevations ____________________________________________  RADIOLOGY  I have personally reviewed the images performed during this visit and I agree with the Radiologist's read.   Interpretation by Radiologist:  DG Chest 2 View  Result Date: 02/12/2020 CLINICAL DATA:  Shortness of breath and unwitnessed fall EXAM: CHEST - 2 VIEW COMPARISON:  None. FINDINGS: The heart size and mediastinal contours are within normal limits. Diffusely increased interstitial markings seen throughout both lungs. There is streaky airspace opacity seen within the left mid lung. A left-sided pacemaker again noted. Aortic knob calcifications are seen. The visualized skeletal structures are unremarkable. IMPRESSION: Diffusely increased interstitial markings seen throughout both lungs which could be due to edema or infectious etiology Streaky mid lung atelectasis within the left lung. Electronically Signed   By: Prudencio Pair M.D.   On: 02/12/2020 02:49   DG Pelvis 1-2 Views  Result Date: 02/12/2020 CLINICAL DATA:  Fall EXAM: PELVIS - 1-2 VIEW  COMPARISON:  None. FINDINGS: There is no evidence of pelvic fracture or diastasis. No pelvic bone lesions are seen. Overlying ureteral stent is seen. IMPRESSION: Negative. Electronically Signed   By: Prudencio Pair M.D.   On: 02/12/2020 02:49   CT Head Wo Contrast  Result Date: 02/12/2020 CLINICAL DATA:  Unwitnessed fall EXAM: CT HEAD WITHOUT CONTRAST TECHNIQUE: Contiguous axial images were obtained from the base of the skull through the vertex without intravenous contrast. COMPARISON:  None. FINDINGS: Brain: No evidence of acute territorial infarction, hemorrhage, hydrocephalus,extra-axial collection or mass lesion/mass effect. There is dilatation the ventricles and sulci consistent with age-related atrophy. Low-attenuation changes in the deep white matter consistent with small vessel ischemia. Vascular: No hyperdense vessel or unexpected calcification. Skull: The skull is intact. No fracture or focal lesion identified. Sinuses/Orbits: The visualized paranasal sinuses and mastoid air cells are clear. The orbits and globes intact. Other: Small soft tissue hematoma seen overlying the left frontal skull Cervical spine: Alignment: Physiologic Skull base and vertebrae: Visualized skull base is intact. No atlanto-occipital dissociation. The vertebral body heights are well maintained. No fracture or pathologic osseous lesion seen. Soft tissues and spinal canal: The visualized paraspinal soft tissues are unremarkable. No prevertebral soft tissue swelling is seen. The spinal canal is grossly unremarkable, no large epidural collection or significant canal narrowing. Disc levels: Multilevel cervical spine spondylosis seen with disc height loss, and uncovertebral osteophytes most notable at C4-C5 with moderate neural foraminal narrowing. Upper chest: Small bilateral pleural effusions are seen. Thoracic inlet is within normal limits. Other: None IMPRESSION: No acute intracranial abnormality. Findings consistent with age  related atrophy and chronic small vessel ischemia Small soft tissue hematoma overlying the left frontal skull No acute fracture or malalignment of the spine. Small bilateral pleural effusions. Electronically Signed   By: Prudencio Pair M.D.   On: 02/12/2020 03:17   CT Cervical Spine Wo Contrast  Result Date: 02/12/2020 CLINICAL DATA:  Unwitnessed fall EXAM: CT HEAD WITHOUT CONTRAST TECHNIQUE: Contiguous axial images were obtained from the base of the skull through the vertex without intravenous contrast. COMPARISON:  None. FINDINGS: Brain: No evidence of acute territorial infarction, hemorrhage, hydrocephalus,extra-axial collection or mass lesion/mass effect. There is dilatation the ventricles and  sulci consistent with age-related atrophy. Low-attenuation changes in the deep white matter consistent with small vessel ischemia. Vascular: No hyperdense vessel or unexpected calcification. Skull: The skull is intact. No fracture or focal lesion identified. Sinuses/Orbits: The visualized paranasal sinuses and mastoid air cells are clear. The orbits and globes intact. Other: Small soft tissue hematoma seen overlying the left frontal skull Cervical spine: Alignment: Physiologic Skull base and vertebrae: Visualized skull base is intact. No atlanto-occipital dissociation. The vertebral body heights are well maintained. No fracture or pathologic osseous lesion seen. Soft tissues and spinal canal: The visualized paraspinal soft tissues are unremarkable. No prevertebral soft tissue swelling is seen. The spinal canal is grossly unremarkable, no large epidural collection or significant canal narrowing. Disc levels: Multilevel cervical spine spondylosis seen with disc height loss, and uncovertebral osteophytes most notable at C4-C5 with moderate neural foraminal narrowing. Upper chest: Small bilateral pleural effusions are seen. Thoracic inlet is within normal limits. Other: None IMPRESSION: No acute intracranial abnormality.  Findings consistent with age related atrophy and chronic small vessel ischemia Small soft tissue hematoma overlying the left frontal skull No acute fracture or malalignment of the spine. Small bilateral pleural effusions. Electronically Signed   By: Prudencio Pair M.D.   On: 02/12/2020 03:17     ____________________________________________   PROCEDURES  Procedure(s) performed: yes  .1-3 Lead EKG Interpretation Performed by: Rudene Re, MD Authorized by: Rudene Re, MD     Interpretation: abnormal     Rhythm: paced     Ectopy: PVCs     Critical Care performed:  None ____________________________________________   INITIAL IMPRESSION / ASSESSMENT AND PLAN / ED COURSE  87 y.o. female with history of ESRD not on dialysis, COPD on 3 L [place],  hypertension, hyperlipidemia who presents for evaluation of a fall from her nursing home.  Patient with head trauma with a left forehead hematoma and a small superficial laceration which was repaired with Dermabond and Steri-Strips.  She has several skin tears and different bruises in different healing stages from several falls.  She has no pain with palpation and range of motion of all of her extremities.  No CT and L-spine tenderness.  She did arrive in mild respiratory distress but satting normally on her baseline 3 L.  She has a history of COPD.  She tells me that her shortness of breath and her breathing are her baseline.  She did receive 3 duo nebs with resolution of the wheezing and improvement of her breathing status.  She is end-stage renal disease and does not wish to start dialysis.  Her labs today continue to reflect that with a creatinine of 7.85, K of 3.8, no anion gap, and anemia of chronic disease.  Patient continues to decline dialysis.  I confirm her wishes with her son [name]) over the phone as well.  CT of the head and cervical spine, chest x-ray, and pelvis x-ray were all visualized by me with no acute traumatic  injuries.  All reads were confirmed by radiology.  After 3 duo nebs her breathing remains at baseline and patient is stable to discharge back to her nursing home which her son is in agreement.  Her old medical records were reviewed.       ____________________________________________  Please note:  Patient was evaluated in Emergency Department today for the symptoms described in the history of present illness. Patient was evaluated in the context of the global COVID-19 pandemic, which necessitated consideration that the patient might be at risk  for infection with the SARS-CoV-2 virus that causes COVID-19. Institutional protocols and algorithms that pertain to the evaluation of patients at risk for COVID-19 are in a state of rapid change based on information released by regulatory bodies including the CDC and federal and state organizations. These policies and algorithms were followed during the patient's care in the ED.  Some ED evaluations and interventions may be delayed as a result of limited staffing during the pandemic.   ____________________________________________   FINAL CLINICAL IMPRESSION(S) / ED DIAGNOSES   Final diagnoses:  Fall, initial encounter  Laceration of forehead, initial encounter      NEW MEDICATIONS STARTED DURING THIS VISIT:  ED Discharge Orders    None       Note:  This document was prepared using Dragon voice recognition software and may include unintentional dictation errors.    Rudene Re, MD 02/12/20 [phone]

## 2020-02-15 ENCOUNTER — Ambulatory Visit: Payer: Medicare Other | Admitting: Cardiovascular Disease

## 2020-02-19 ENCOUNTER — Telehealth: Payer: Self-pay | Admitting: Cardiovascular Disease

## 2020-02-19 NOTE — Telephone Encounter (Signed)
Patient referred to hospice and appt cancelled.  Deleting recall.

## 2020-02-23 DEATH — deceased

## 2020-03-30 ENCOUNTER — Ambulatory Visit: Payer: Self-pay | Admitting: Urology

## 2020-07-04 ENCOUNTER — Ambulatory Visit: Payer: Medicare Other | Admitting: Cardiovascular Disease

## 2020-08-05 ENCOUNTER — Ambulatory Visit: Payer: Medicare Other

## 2022-09-14 IMAGING — CR DG CHEST 2V
1 series · 2 of 2 positions shown · non-contrast
Comparison: 12/26/2019.

CLINICAL DATA: Shortness of breath.

EXAM:
CHEST - 2 VIEW

[Series 1: w chest pa · 0.14mm/px · 2 of 2 slices shown]
[im 1/2]
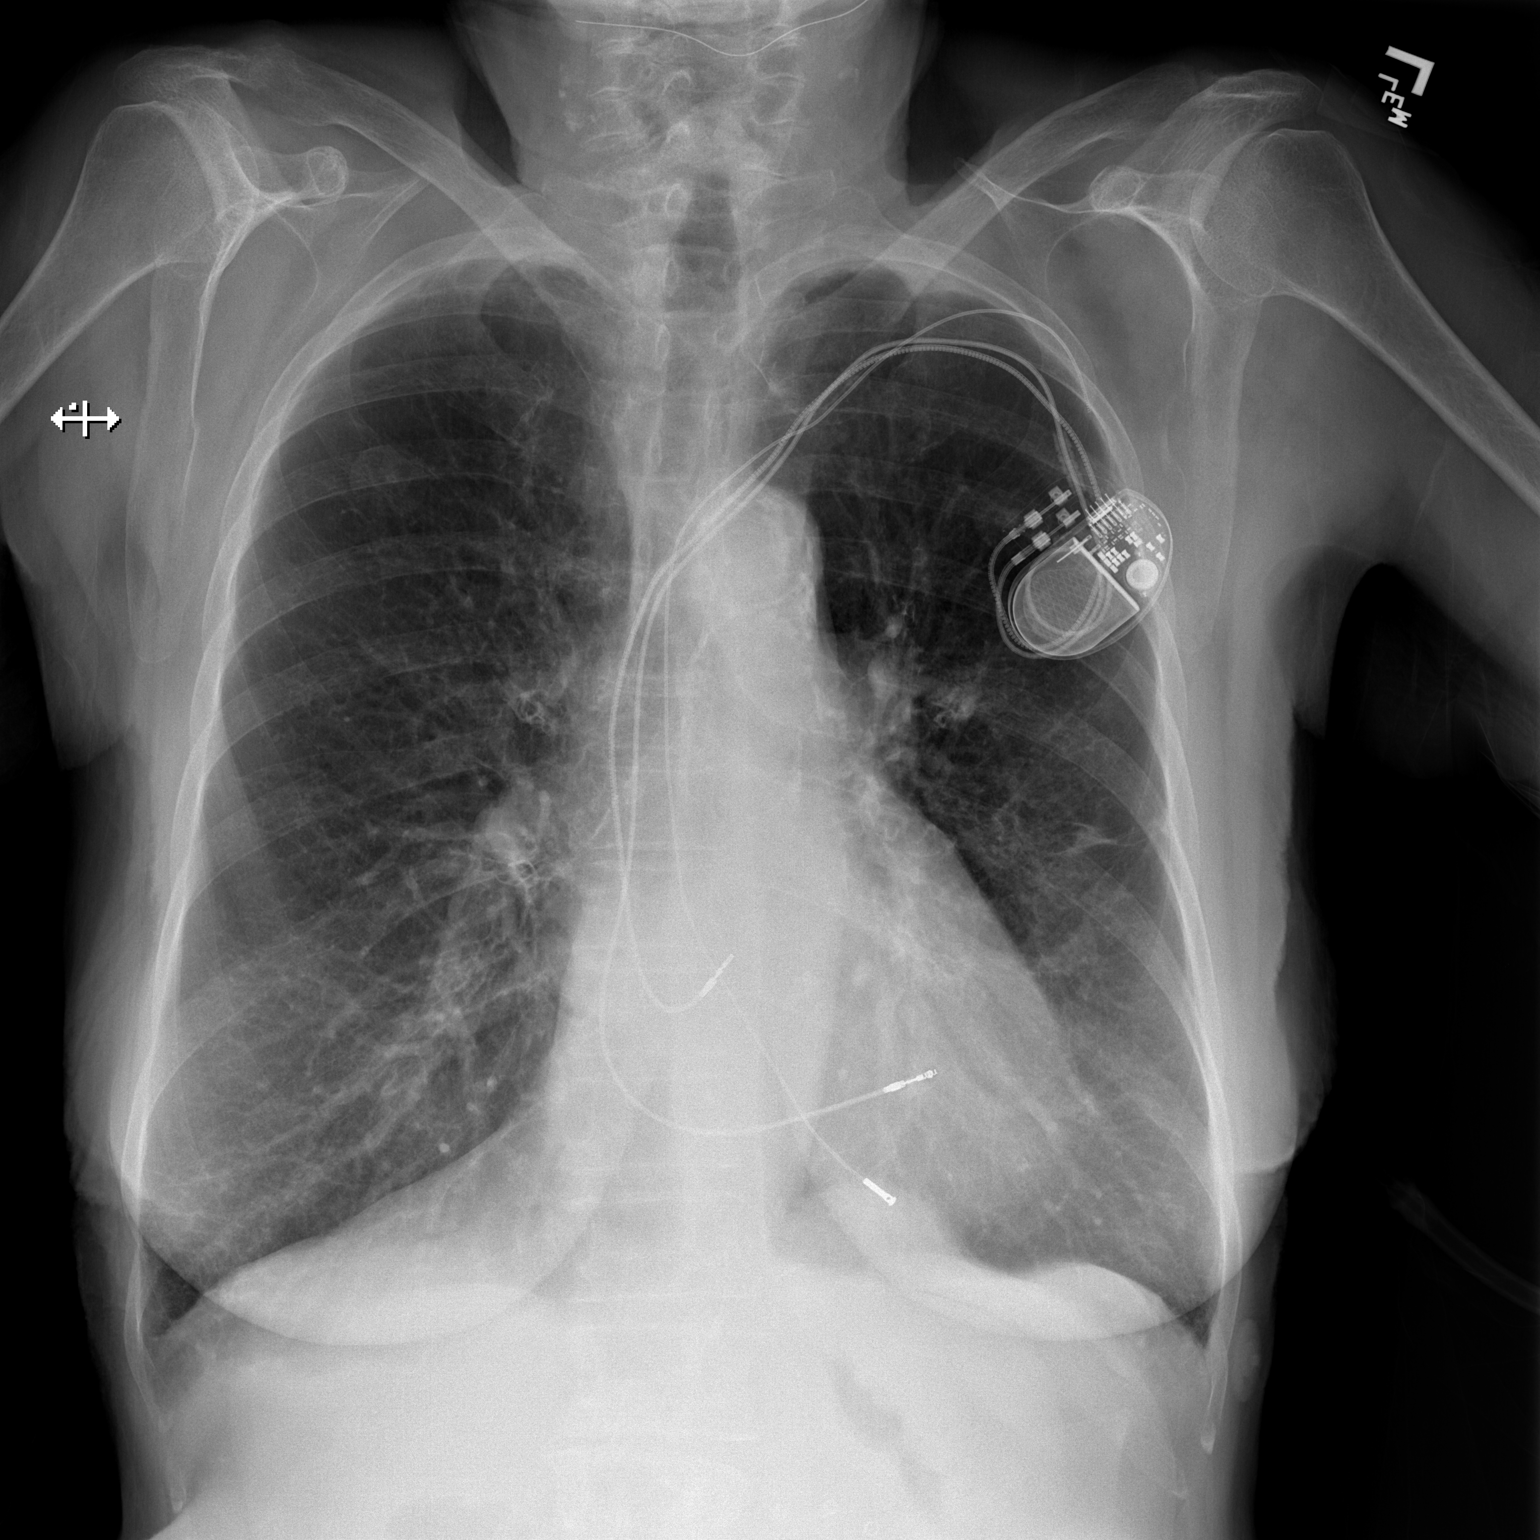
[im 2/2]
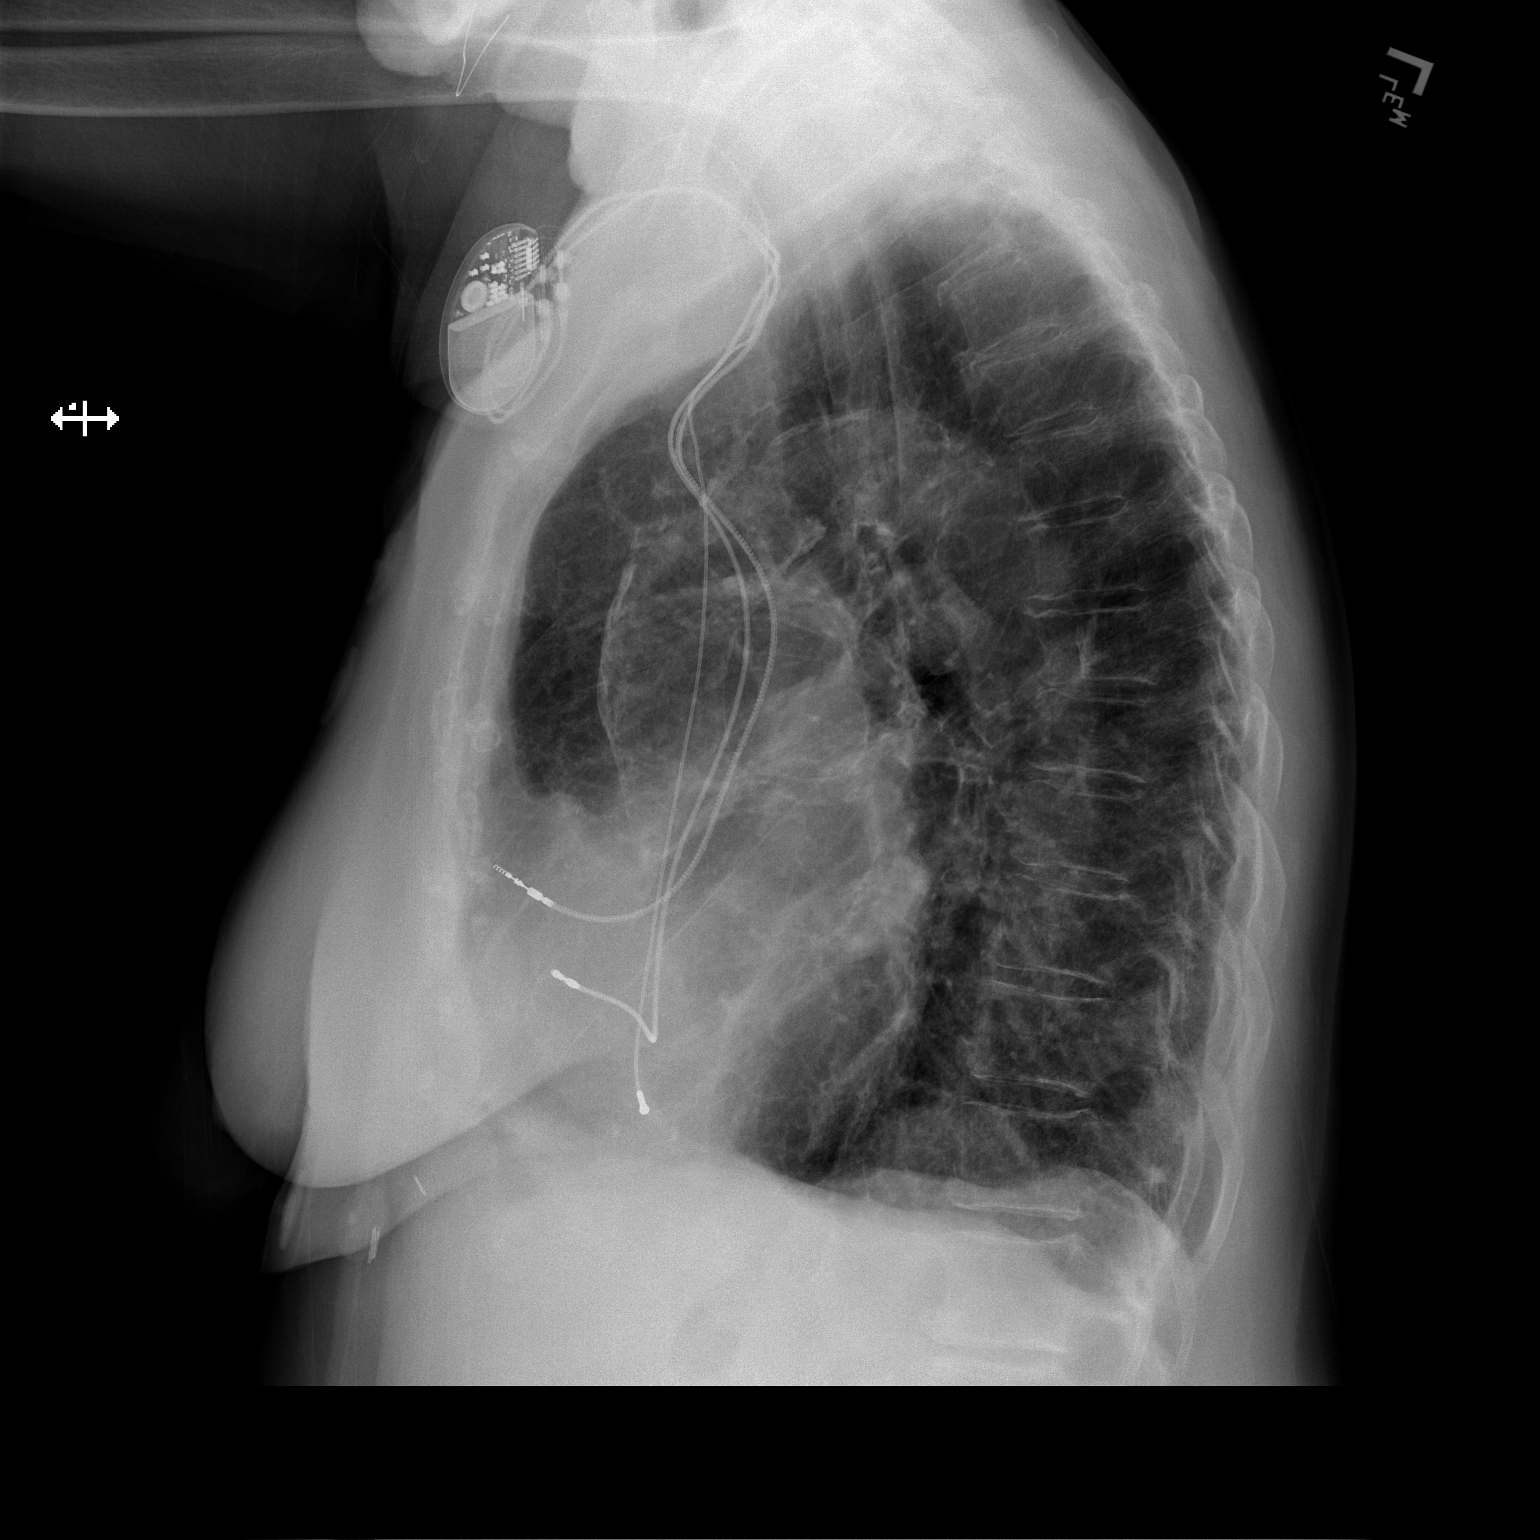

[2 of 2 positions shown; findings below may reference images not displayed]

FINDINGS: The heart size and mediastinal contours are within normal limits.
Left subclavian cardiac rhythm maintenance device in similar
position. Both lungs are clear. Chronic hyperinflation. No visible
pleural effusions or pneumothorax. Biapical pleuroparenchymal
scarring. No acute osseous abnormality.
IMPRESSION: 1. No active cardiopulmonary disease.
2. Chronic hyperinflation, suggesting COPD/emphysema.
